# Patient Record
Sex: Female | Born: 1950
Health system: Southern US, Community
[De-identification: ages and names within clinical notes are randomized; demographics above are authoritative.]

## PROBLEM LIST (undated history)

## (undated) DIAGNOSIS — J189 Pneumonia, unspecified organism: Secondary | ICD-10-CM

## (undated) DIAGNOSIS — Z780 Asymptomatic menopausal state: Secondary | ICD-10-CM

## (undated) DIAGNOSIS — M1990 Unspecified osteoarthritis, unspecified site: Secondary | ICD-10-CM

## (undated) DIAGNOSIS — E538 Deficiency of other specified B group vitamins: Secondary | ICD-10-CM

## (undated) DIAGNOSIS — R7309 Other abnormal glucose: Secondary | ICD-10-CM

## (undated) DIAGNOSIS — D649 Anemia, unspecified: Secondary | ICD-10-CM

## (undated) DIAGNOSIS — E785 Hyperlipidemia, unspecified: Secondary | ICD-10-CM

## (undated) DIAGNOSIS — I10 Essential (primary) hypertension: Secondary | ICD-10-CM

## (undated) DIAGNOSIS — R059 Cough, unspecified: Secondary | ICD-10-CM

## (undated) DIAGNOSIS — G4733 Obstructive sleep apnea (adult) (pediatric): Secondary | ICD-10-CM

## (undated) DIAGNOSIS — M25569 Pain in unspecified knee: Secondary | ICD-10-CM

## (undated) DIAGNOSIS — I8290 Acute embolism and thrombosis of unspecified vein: Secondary | ICD-10-CM

## (undated) DIAGNOSIS — E119 Type 2 diabetes mellitus without complications: Secondary | ICD-10-CM

## (undated) HISTORY — DX: Cough, unspecified: R05.9

## (undated) HISTORY — PX: TONSILLECTOMY: SUR1361

## (undated) HISTORY — DX: Other abnormal glucose: R73.09

## (undated) HISTORY — DX: Obstructive sleep apnea (adult) (pediatric): G47.33

## (undated) HISTORY — PX: ABDOMINAL HYSTERECTOMY: SHX81

## (undated) HISTORY — DX: Asymptomatic menopausal state: Z78.0

## (undated) HISTORY — DX: Pain in unspecified knee: M25.569

## (undated) HISTORY — DX: Deficiency of other specified B group vitamins: E53.8

## (undated) HISTORY — DX: Unspecified osteoarthritis, unspecified site: M19.90

## (undated) HISTORY — DX: Acute embolism and thrombosis of unspecified vein: I82.90

## (undated) HISTORY — DX: Hyperlipidemia, unspecified: E78.5

## (undated) HISTORY — DX: Essential (primary) hypertension: I10

## (undated) HISTORY — DX: Anemia, unspecified: D64.9

## (undated) HISTORY — DX: Type 2 diabetes mellitus without complications: E11.9

## (undated) HISTORY — PX: WISDOM TOOTH EXTRACTION: SHX21

---

## 1996-06-19 DIAGNOSIS — Z780 Asymptomatic menopausal state: Secondary | ICD-10-CM

## 1996-06-19 HISTORY — DX: Asymptomatic menopausal state: Z78.0

## 1998-12-01 ENCOUNTER — Ambulatory Visit (HOSPITAL_COMMUNITY): Admission: RE | Admit: 1998-12-01 | Discharge: 1998-12-01 | Payer: Self-pay | Admitting: Gastroenterology

## 1999-01-17 ENCOUNTER — Encounter: Payer: Self-pay | Admitting: Gastroenterology

## 1999-01-17 ENCOUNTER — Ambulatory Visit (HOSPITAL_COMMUNITY): Admission: RE | Admit: 1999-01-17 | Discharge: 1999-01-17 | Payer: Self-pay | Admitting: Gastroenterology

## 1999-06-26 ENCOUNTER — Emergency Department (HOSPITAL_COMMUNITY): Admission: EM | Admit: 1999-06-26 | Discharge: 1999-06-26 | Payer: Self-pay | Admitting: Emergency Medicine

## 1999-07-18 ENCOUNTER — Emergency Department (HOSPITAL_COMMUNITY): Admission: EM | Admit: 1999-07-18 | Discharge: 1999-07-18 | Payer: Self-pay | Admitting: Emergency Medicine

## 1999-12-16 ENCOUNTER — Encounter: Payer: Self-pay | Admitting: Internal Medicine

## 1999-12-16 ENCOUNTER — Ambulatory Visit (HOSPITAL_COMMUNITY): Admission: RE | Admit: 1999-12-16 | Discharge: 1999-12-16 | Payer: Self-pay | Admitting: Internal Medicine

## 2000-04-06 ENCOUNTER — Ambulatory Visit (HOSPITAL_COMMUNITY): Admission: RE | Admit: 2000-04-06 | Discharge: 2000-04-06 | Payer: Self-pay | Admitting: Internal Medicine

## 2000-04-06 ENCOUNTER — Encounter: Payer: Self-pay | Admitting: Internal Medicine

## 2002-07-24 ENCOUNTER — Other Ambulatory Visit: Admission: RE | Admit: 2002-07-24 | Discharge: 2002-07-24 | Payer: Self-pay | Admitting: Obstetrics and Gynecology

## 2002-10-23 ENCOUNTER — Encounter: Payer: Self-pay | Admitting: Internal Medicine

## 2002-10-23 ENCOUNTER — Encounter: Admission: RE | Admit: 2002-10-23 | Discharge: 2002-10-23 | Payer: Self-pay | Admitting: Internal Medicine

## 2002-10-24 ENCOUNTER — Encounter: Payer: Self-pay | Admitting: Internal Medicine

## 2002-10-24 ENCOUNTER — Ambulatory Visit (HOSPITAL_COMMUNITY): Admission: RE | Admit: 2002-10-24 | Discharge: 2002-10-24 | Payer: Self-pay | Admitting: Internal Medicine

## 2003-11-10 ENCOUNTER — Other Ambulatory Visit: Admission: RE | Admit: 2003-11-10 | Discharge: 2003-11-10 | Payer: Self-pay | Admitting: Obstetrics and Gynecology

## 2004-07-14 ENCOUNTER — Ambulatory Visit: Payer: Self-pay | Admitting: Internal Medicine

## 2004-07-29 ENCOUNTER — Ambulatory Visit: Payer: Self-pay | Admitting: Internal Medicine

## 2004-08-15 ENCOUNTER — Encounter: Admission: RE | Admit: 2004-08-15 | Discharge: 2004-11-13 | Payer: Self-pay | Admitting: Internal Medicine

## 2004-10-27 ENCOUNTER — Ambulatory Visit: Payer: Self-pay | Admitting: Internal Medicine

## 2004-12-22 ENCOUNTER — Other Ambulatory Visit: Admission: RE | Admit: 2004-12-22 | Discharge: 2004-12-22 | Payer: Self-pay | Admitting: Obstetrics and Gynecology

## 2005-08-11 ENCOUNTER — Ambulatory Visit: Payer: Self-pay | Admitting: Internal Medicine

## 2005-10-13 ENCOUNTER — Ambulatory Visit: Payer: Self-pay | Admitting: Internal Medicine

## 2005-10-18 ENCOUNTER — Ambulatory Visit: Payer: Self-pay | Admitting: Internal Medicine

## 2006-02-14 ENCOUNTER — Ambulatory Visit: Payer: Self-pay | Admitting: Internal Medicine

## 2006-05-17 ENCOUNTER — Ambulatory Visit: Payer: Self-pay | Admitting: Internal Medicine

## 2006-05-17 LAB — CONVERTED CEMR LAB
Albumin: 3.7 g/dL (ref 3.5–5.2)
Alkaline Phosphatase: 73 units/L (ref 39–117)
CO2: 20 meq/L (ref 19–32)
Chol/HDL Ratio, serum: 3.4
Glucose, Bld: 74 mg/dL (ref 70–99)
Ketones, ur: NEGATIVE mg/dL
Leukocytes, UA: NEGATIVE
Mucus, UA: NEGATIVE
TSH: 1.67 microintl units/mL (ref 0.35–5.50)
Total Bilirubin: 0.6 mg/dL (ref 0.3–1.2)
Total Protein: 7 g/dL (ref 6.0–8.3)
VLDL: 12 mg/dL (ref 0–40)
pH: 6 (ref 5.0–8.0)

## 2006-07-17 ENCOUNTER — Ambulatory Visit: Payer: Self-pay | Admitting: Gastroenterology

## 2006-08-07 ENCOUNTER — Ambulatory Visit: Payer: Self-pay | Admitting: Gastroenterology

## 2006-08-17 ENCOUNTER — Ambulatory Visit: Payer: Self-pay | Admitting: Internal Medicine

## 2006-08-17 LAB — CONVERTED CEMR LAB
Basophils Relative: 4.2 % — ABNORMAL HIGH (ref 0.0–1.0)
CO2: 27 meq/L (ref 19–32)
Calcium: 9.3 mg/dL (ref 8.4–10.5)
Eosinophils Relative: 4.2 % (ref 0.0–5.0)
GFR calc Af Amer: 75 mL/min
Glucose, Bld: 90 mg/dL (ref 70–99)
Hemoglobin: 12.4 g/dL (ref 12.0–15.0)
Lymphocytes Relative: 27.7 % (ref 12.0–46.0)
Monocytes Absolute: 0.4 10*3/uL (ref 0.2–0.7)
Neutro Abs: 4.9 10*3/uL (ref 1.4–7.7)
Platelets: 224 10*3/uL (ref 150–400)
Saturation Ratios: 14.1 % — ABNORMAL LOW (ref 20.0–50.0)
Vitamin B-12: 210 pg/mL — ABNORMAL LOW (ref 211–911)
WBC: 8.5 10*3/uL (ref 4.5–10.5)

## 2006-08-24 ENCOUNTER — Ambulatory Visit: Payer: Self-pay | Admitting: Internal Medicine

## 2007-02-21 ENCOUNTER — Ambulatory Visit: Payer: Self-pay | Admitting: Internal Medicine

## 2007-02-25 LAB — CONVERTED CEMR LAB
Bilirubin, Direct: 0.1 mg/dL (ref 0.0–0.3)
CO2: 30 meq/L (ref 19–32)
Cholesterol: 216 mg/dL (ref 0–200)
Creatinine, Ser: 0.9 mg/dL (ref 0.4–1.2)
Glucose, Bld: 124 mg/dL — ABNORMAL HIGH (ref 70–99)
HDL: 61.8 mg/dL (ref >39.0)
Hgb A1c MFr Bld: 6.9 % — ABNORMAL HIGH (ref 4.6–6.0)
Potassium: 4.3 meq/L (ref 3.5–5.1)
Sodium: 140 meq/L (ref 135–145)
Total Bilirubin: 0.6 mg/dL (ref 0.3–1.2)
Total Protein: 7 g/dL (ref 6.0–8.3)
VLDL: 16 mg/dL (ref 0–40)
Vitamin B-12: 425 pg/mL (ref 211–911)

## 2007-04-24 ENCOUNTER — Encounter: Payer: Self-pay | Admitting: Internal Medicine

## 2007-04-24 DIAGNOSIS — E538 Deficiency of other specified B group vitamins: Secondary | ICD-10-CM

## 2007-04-24 DIAGNOSIS — D649 Anemia, unspecified: Secondary | ICD-10-CM | POA: Insufficient documentation

## 2007-04-24 DIAGNOSIS — E119 Type 2 diabetes mellitus without complications: Secondary | ICD-10-CM

## 2007-04-24 DIAGNOSIS — I10 Essential (primary) hypertension: Secondary | ICD-10-CM

## 2007-04-24 HISTORY — DX: Anemia, unspecified: D64.9

## 2007-04-24 HISTORY — DX: Deficiency of other specified B group vitamins: E53.8

## 2007-04-24 HISTORY — DX: Essential (primary) hypertension: I10

## 2007-04-24 HISTORY — DX: Type 2 diabetes mellitus without complications: E11.9

## 2007-07-17 ENCOUNTER — Ambulatory Visit: Payer: Self-pay | Admitting: Internal Medicine

## 2007-07-17 DIAGNOSIS — E785 Hyperlipidemia, unspecified: Secondary | ICD-10-CM

## 2007-07-17 HISTORY — DX: Hyperlipidemia, unspecified: E78.5

## 2007-10-18 ENCOUNTER — Ambulatory Visit: Payer: Self-pay | Admitting: Internal Medicine

## 2007-10-21 LAB — CONVERTED CEMR LAB
BUN: 18 mg/dL (ref 6–23)
Creatinine, Ser: 1.1 mg/dL (ref 0.4–1.2)
GFR calc Af Amer: 67 mL/min
GFR calc non Af Amer: 55 mL/min
Glucose, Bld: 97 mg/dL (ref 70–99)

## 2007-11-04 ENCOUNTER — Telehealth: Payer: Self-pay | Admitting: Internal Medicine

## 2008-06-02 ENCOUNTER — Ambulatory Visit: Payer: Self-pay | Admitting: Internal Medicine

## 2008-06-02 LAB — CONVERTED CEMR LAB
ALT: 23 units/L (ref 0–35)
AST: 18 units/L (ref 0–37)
Albumin: 3.5 g/dL (ref 3.5–5.2)
Alkaline Phosphatase: 64 units/L (ref 39–117)
BUN: 21 mg/dL (ref 6–23)
Bacteria, UA: NEGATIVE
Bilirubin, Direct: 0.1 mg/dL (ref 0.0–0.3)
CO2: 28 meq/L (ref 19–32)
Chloride: 105 meq/L (ref 96–112)
Crystals: NEGATIVE
Eosinophils Relative: 2.5 % (ref 0.0–5.0)
GFR calc Af Amer: 74 mL/min
Glucose, Bld: 141 mg/dL — ABNORMAL HIGH (ref 70–99)
HCT: 34.8 % — ABNORMAL LOW (ref 36.0–46.0)
Hemoglobin, Urine: NEGATIVE
Hemoglobin: 11.9 g/dL — ABNORMAL LOW (ref 12.0–15.0)
Leukocytes, UA: NEGATIVE
Lymphocytes Relative: 31.2 % (ref 12.0–46.0)
Monocytes Absolute: 0.7 10*3/uL (ref 0.1–1.0)
Monocytes Relative: 8.1 % (ref 3.0–12.0)
Nitrite: NEGATIVE
Platelets: 282 10*3/uL (ref 150–400)
Potassium: 4.6 meq/L (ref 3.5–5.1)
RBC / HPF: NONE SEEN
RDW: 13 % (ref 11.5–14.6)
Sodium: 139 meq/L (ref 135–145)
Total CHOL/HDL Ratio: 3.4
Total Protein, Urine: 30 mg/dL — AB
Total Protein: 6.9 g/dL (ref 6.0–8.3)
WBC, UA: NONE SEEN cells/hpf
WBC: 9 10*3/uL (ref 4.5–10.5)
pH: 6 (ref 5.0–8.0)

## 2008-06-08 ENCOUNTER — Ambulatory Visit: Payer: Self-pay | Admitting: Internal Medicine

## 2008-06-08 DIAGNOSIS — R209 Unspecified disturbances of skin sensation: Secondary | ICD-10-CM | POA: Insufficient documentation

## 2008-10-09 ENCOUNTER — Telehealth: Payer: Self-pay | Admitting: Internal Medicine

## 2008-10-27 ENCOUNTER — Ambulatory Visit: Payer: Self-pay | Admitting: Internal Medicine

## 2008-10-27 LAB — CONVERTED CEMR LAB
BUN: 15 mg/dL (ref 6–23)
CO2: 27 meq/L (ref 19–32)
Calcium: 9.3 mg/dL (ref 8.4–10.5)
Direct LDL: 132.2 mg/dL
GFR calc non Af Amer: 83.47 mL/min (ref >60)
Glucose, Bld: 121 mg/dL — ABNORMAL HIGH (ref 70–99)
HDL: 61.1 mg/dL (ref >39.00)
Hgb A1c MFr Bld: 7.3 % — ABNORMAL HIGH (ref 4.6–6.5)
Sodium: 142 meq/L (ref 135–145)
Vitamin B-12: 620 pg/mL (ref 211–911)

## 2008-11-03 ENCOUNTER — Ambulatory Visit: Payer: Self-pay | Admitting: Internal Medicine

## 2008-11-04 LAB — CONVERTED CEMR LAB: Vit D, 25-Hydroxy: 27 ng/mL — ABNORMAL LOW

## 2009-01-21 ENCOUNTER — Telehealth: Payer: Self-pay | Admitting: Internal Medicine

## 2009-06-19 DIAGNOSIS — R7309 Other abnormal glucose: Secondary | ICD-10-CM

## 2009-06-19 HISTORY — DX: Other abnormal glucose: R73.09

## 2009-09-15 ENCOUNTER — Ambulatory Visit: Payer: Self-pay | Admitting: Internal Medicine

## 2009-09-15 DIAGNOSIS — R0609 Other forms of dyspnea: Secondary | ICD-10-CM | POA: Insufficient documentation

## 2009-09-15 DIAGNOSIS — R5383 Other fatigue: Secondary | ICD-10-CM

## 2009-09-15 DIAGNOSIS — R0989 Other specified symptoms and signs involving the circulatory and respiratory systems: Secondary | ICD-10-CM | POA: Insufficient documentation

## 2009-09-15 DIAGNOSIS — R5381 Other malaise: Secondary | ICD-10-CM | POA: Insufficient documentation

## 2009-09-15 LAB — CONVERTED CEMR LAB: Vit D, 25-Hydroxy: 35 ng/mL (ref 30–89)

## 2009-09-17 LAB — CONVERTED CEMR LAB
AST: 21 units/L (ref 0–37)
Alkaline Phosphatase: 63 units/L (ref 39–117)
Basophils Absolute: 0 10*3/uL (ref 0.0–0.1)
Bilirubin, Direct: 0.1 mg/dL (ref 0.0–0.3)
CO2: 29 meq/L (ref 19–32)
Calcium: 10 mg/dL (ref 8.4–10.5)
Creatinine, Ser: 1.1 mg/dL (ref 0.4–1.2)
Eosinophils Absolute: 0.2 10*3/uL (ref 0.0–0.7)
GFR calc non Af Amer: 66 mL/min (ref >60)
Glucose, Bld: 132 mg/dL — ABNORMAL HIGH (ref 70–99)
Lymphocytes Relative: 39 % (ref 12.0–46.0)
MCHC: 33.1 g/dL (ref 30.0–36.0)
Monocytes Relative: 8.9 % (ref 3.0–12.0)
Neutrophils Relative %: 50.3 % (ref 43.0–77.0)
Nitrite: NEGATIVE
RBC: 4.29 M/uL (ref 3.87–5.11)
RDW: 14 % (ref 11.5–14.6)
Specific Gravity, Urine: 1.01 (ref 1.000–1.030)
Total Protein, Urine: NEGATIVE mg/dL
Vitamin B-12: 800 pg/mL (ref 211–911)
pH: 7 (ref 5.0–8.0)

## 2009-09-30 ENCOUNTER — Ambulatory Visit: Payer: Self-pay | Admitting: Pulmonary Disease

## 2009-09-30 DIAGNOSIS — G4733 Obstructive sleep apnea (adult) (pediatric): Secondary | ICD-10-CM

## 2009-09-30 HISTORY — DX: Obstructive sleep apnea (adult) (pediatric): G47.33

## 2009-10-25 ENCOUNTER — Encounter: Payer: Self-pay | Admitting: Pulmonary Disease

## 2009-10-25 ENCOUNTER — Ambulatory Visit (HOSPITAL_BASED_OUTPATIENT_CLINIC_OR_DEPARTMENT_OTHER): Admission: RE | Admit: 2009-10-25 | Discharge: 2009-10-25 | Payer: Self-pay | Admitting: Pulmonary Disease

## 2009-10-31 ENCOUNTER — Ambulatory Visit: Payer: Self-pay | Admitting: Pulmonary Disease

## 2009-11-01 ENCOUNTER — Telehealth (INDEPENDENT_AMBULATORY_CARE_PROVIDER_SITE_OTHER): Payer: Self-pay | Admitting: *Deleted

## 2009-11-12 ENCOUNTER — Ambulatory Visit: Payer: Self-pay | Admitting: Pulmonary Disease

## 2009-11-26 ENCOUNTER — Telehealth: Payer: Self-pay | Admitting: Internal Medicine

## 2009-12-06 ENCOUNTER — Telehealth (INDEPENDENT_AMBULATORY_CARE_PROVIDER_SITE_OTHER): Payer: Self-pay | Admitting: *Deleted

## 2009-12-07 ENCOUNTER — Ambulatory Visit: Payer: Self-pay | Admitting: Internal Medicine

## 2009-12-07 DIAGNOSIS — R358 Other polyuria: Secondary | ICD-10-CM

## 2009-12-07 DIAGNOSIS — R3589 Other polyuria: Secondary | ICD-10-CM | POA: Insufficient documentation

## 2009-12-07 DIAGNOSIS — M25519 Pain in unspecified shoulder: Secondary | ICD-10-CM | POA: Insufficient documentation

## 2010-01-29 ENCOUNTER — Encounter: Payer: Self-pay | Admitting: Pulmonary Disease

## 2010-02-11 ENCOUNTER — Encounter: Payer: Self-pay | Admitting: Family Medicine

## 2010-02-11 LAB — CONVERTED CEMR LAB
Cholesterol: 193 mg/dL (ref 0–200)
Glucose, Bld: 99 mg/dL (ref 70–99)
HDL: 62 mg/dL (ref >39)
Triglycerides: 93 mg/dL (ref <150)

## 2010-03-15 ENCOUNTER — Encounter: Payer: Self-pay | Admitting: Family Medicine

## 2010-03-15 LAB — CONVERTED CEMR LAB
Glucose, Bld: 124 mg/dL — ABNORMAL HIGH (ref 70–99)
HDL: 59 mg/dL (ref >39)
Total CHOL/HDL Ratio: 3.6
VLDL: 26 mg/dL (ref 0–40)

## 2010-03-30 ENCOUNTER — Ambulatory Visit: Payer: Self-pay | Admitting: Internal Medicine

## 2010-04-19 HISTORY — PX: HERNIA REPAIR: SHX51

## 2010-05-06 ENCOUNTER — Inpatient Hospital Stay (HOSPITAL_COMMUNITY): Admission: EM | Admit: 2010-05-06 | Discharge: 2010-05-07 | Payer: Self-pay | Admitting: Emergency Medicine

## 2010-05-11 ENCOUNTER — Ambulatory Visit: Payer: Self-pay | Admitting: Internal Medicine

## 2010-05-11 LAB — CONVERTED CEMR LAB
Albumin: 3.7 g/dL (ref 3.5–5.2)
CO2: 21 meq/L (ref 19–32)
Calcium: 9.1 mg/dL (ref 8.4–10.5)
Creatinine, Ser: 1.01 mg/dL (ref 0.40–1.20)
Hgb A1c MFr Bld: 6.6 % — ABNORMAL HIGH (ref 4.6–6.5)
Indirect Bilirubin: 0.4 mg/dL (ref 0.0–0.9)
Total Protein: 6.6 g/dL (ref 6.0–8.3)

## 2010-05-19 ENCOUNTER — Ambulatory Visit: Payer: Self-pay | Admitting: Internal Medicine

## 2010-05-25 ENCOUNTER — Encounter: Payer: Self-pay | Admitting: Internal Medicine

## 2010-07-19 NOTE — Assessment & Plan Note (Signed)
Summary: rov for review of sleep study   Primary Provider/Referring Provider:  Plotnikov  CC:  Pt is here for a f/u appt to discuss sleep study results.  .  History of Present Illness: the pt comes in today for f/u of her recent sleep study.  She was found to have an AHI of 14/hr with desat to 78%.  I have reviewed the study in detail with her, and answered all of her questions.  Medications Prior to Update: 1)  Lotensin 10 Mg  Tabs (Benazepril Hcl) .... 2 By Mouth Qhs 2)  Vitamin B-12 1000 Mcg  Tabs (Cyanocobalamin) .[name].. 1 Qd 3)  Fish Oil   Oil (Fish Oil) .[name].. 1 By Mouth Two Times A Day 4)  Vitamin D3 1000 Unit  Tabs (Cholecalciferol) .... 2 By Mouth Daily 5)  Loratadine 10 Mg  Tabs (Loratadine) .... Once Daily As Needed Allergies  Allergies (verified): 1)  ! Tetracycline  Review of Systems  The patient denies shortness of breath with activity, shortness of breath at rest, productive cough, non-productive cough, coughing up blood, chest pain, irregular heartbeats, acid heartburn, indigestion, loss of appetite, weight change, abdominal pain, difficulty swallowing, sore throat, tooth/dental problems, headaches, nasal congestion/difficulty breathing through nose, sneezing, itching, ear ache, anxiety, depression, hand/feet swelling, joint stiffness or pain, rash, change in color of mucus, and fever.    Vital Signs:  Patient profile:   60 year old female Height:      60 inches Weight:      244 pounds O2 Sat:      96 % on Room air Temp:     97.8 degrees F oral Pulse rate:   79 / minute BP sitting:   122 / 70  (left arm) Cuff size:   large  Vitals Entered By: Arman Filter LPN (Nov 12, 2009 9:39 AM)  O2 Flow:  Room air CC: Pt is here for a f/u appt to discuss sleep study results.   Comments Medications reviewed with patient Arman Filter LPN  Nov 12, 2009 9:44 AM    Physical Exam  General:  obese female in nad Nose:  mild narrowing Mouth:  elongation of soft palate and  uvula Extremities:  mild edema, no cyanosis Neurologic:  alert, mildly sleepy, moves all 4.   Impression & Recommendations:  Problem # 1:  OBSTRUCTIVE SLEEP APNEA (ICD-327.23) the pt has mild osa by her recent sleep study, but is symptomatic during the night and day.  I have explained this is not a significant health issue for her at this level, and the decision to treat should be based on its impact to her QOL.  I have discussed the various ways to treat, including a trial of weight loss alone, ua surgery, dental appliance, and cpap.  She feels that she needs to treat this while working on weight loss, and that cpap will probably work best for her.  Will go ahead and set up cpap at moderate pressure level with mask of choice and see her back in 4 weeks to troubleshoot.  At some point, will need to optimize pressure for her.  Other Orders: Est. Patient Level III (16109) DME Referral (DME)  Patient Instructions: 1)  will start on cpap for next 4 weeks..please call if having issues with the device. 2)  work on weight loss 3)  followup with me in 4mos.

## 2010-07-19 NOTE — Progress Notes (Signed)
Summary: REQ FOR RX?   Phone Note Call from Patient Call back at [phone]   Summary of Call: Pt c/o urinary symptoms and req rx for antibiotic to go to pharmacy.  Initial call taken by: Lamar Sprinkles, CMA,  November 26, 2009 5:11 PM  Follow-up for Phone Call        OV Sat clinic Use OTC meds for now UC tonight if sick  Follow-up by: Tresa Garter MD,  November 26, 2009 5:39 PM  Additional Follow-up for Phone Call Additional follow up Details #1::        LEft vm w/above Additional Follow-up by: Lamar Sprinkles, CMA,  November 26, 2009 5:42 PM

## 2010-07-19 NOTE — Assessment & Plan Note (Signed)
Summary: discuss referral/#/cd   Vital Signs:  Patient profile:   60 year old female Weight:      245 pounds BMI:     48.02 Temp:     98.5 degrees F oral Pulse rate:   88 / minute BP sitting:   108 / 72  (left arm)  Vitals Entered By: Tora Perches (September 15, 2009 9:03 AM) CC: discuss a referral   CC:  discuss a referral.  History of Present Illness: C/o possible OSA - snoring,she is feeling  tired a lot The patient presents for a follow up of hypertension, diabetes, hyperlipidemia   Preventive Screening-Counseling & Management  Alcohol-Tobacco     Smoking Status: never  Current Medications (verified): 1)  Lotensin 10 Mg  Tabs (Benazepril Hcl) .... 2 By Mouth Qhs 2)  Vitamin B-12 1000 Mcg  Tabs (Cyanocobalamin) .[name].. 1 Qd 3)  Fish Oil   Oil (Fish Oil) .[name].. 1 By Mouth Two Times A Day 4)  Vitamin D3 1000 Unit  Tabs (Cholecalciferol) .... 2 By Mouth Daily 5)  Loratadine 10 Mg  Tabs (Loratadine) .... Once Daily As Needed Allergies  Allergies: 1)  ! Tetracycline  Past History:  Past Medical History: Last updated: 07/17/2007 Anemia-NOS Diabetes mellitus, type II Hypertension Hyperlipidemia, mild Vit B12 def Insomnia Menopause 51  Dr Henderson Cloud  Social History: Last updated: 07/17/2007 Occupation: Married Never Smoked  Review of Systems       The patient complains of headaches.  The patient denies fever, chest pain, syncope, and hemoptysis.    Physical Exam  General:  overweight-appearing.   Nose:  External nasal examination shows no deformity or inflammation. Nasal mucosa are pink and moist without lesions or exudates. Mouth:  Oral mucosa and oropharynx without lesions or exudates.  Teeth in good repair. Lungs:  Normal respiratory effort, chest expands symmetrically. Lungs are clear to auscultation, no crackles or wheezes. Heart:  Normal rate and regular rhythm. S1 and S2 normal without gallop, murmur, click, rub or other extra sounds. Abdomen:  Bowel sounds  positive,abdomen soft and non-tender without masses, organomegaly or hernias noted. Msk:  No deformity or scoliosis noted of thoracic or lumbar spine.   Pulses:  R and L carotid,radial,femoral,dorsalis pedis and posterior tibial pulses are full and equal bilaterally Extremities:  No clubbing, cyanosis, edema, or deformity noted with normal full range of motion of all joints.   Neurologic:  L big toe tip is nymb Skin:  Intact without suspicious lesions or rashes Psych:  Cognition and judgment appear intact. Alert and cooperative with normal attention span and concentration. No apparent delusions, illusions, hallucinations   Impression & Recommendations:  Problem # 1:  FATIGUE (ICD-780.79) Assessment Deteriorated  Orders: TLB-B12, Serum-Total ONLY (16109-U04) TLB-BMP (Basic Metabolic Panel-BMET) (80048-METABOL) TLB-CBC Platelet - w/Differential (85025-CBCD) TLB-Hepatic/Liver Function Pnl (80076-HEPATIC) TLB-TSH (Thyroid Stimulating Hormone) (84443-TSH) TLB-A1C / Hgb A1C (Glycohemoglobin) (83036-A1C) T-Vitamin D (25-Hydroxy) (54098-11914) TLB-Udip ONLY (81003-UDIP) Sleep Disorder Referral (Sleep Disorder) to r/o OSA  Problem # 2:  SNORING (ICD-786.09) Assessment: Deteriorated  Her updated medication list for this problem includes:    Lotensin 10 Mg Tabs (Benazepril hcl) .[name]... 2 by mouth qhs  Orders: TLB-B12, Serum-Total ONLY (78295-A21) TLB-BMP (Basic Metabolic Panel-BMET) (80048-METABOL) TLB-CBC Platelet - w/Differential (85025-CBCD) TLB-Hepatic/Liver Function Pnl (80076-HEPATIC) TLB-TSH (Thyroid Stimulating Hormone) (84443-TSH) TLB-A1C / Hgb A1C (Glycohemoglobin) (83036-A1C) T-Vitamin D (25-Hydroxy) (30865-78469) TLB-Udip ONLY (81003-UDIP) Sleep Disorder Referral (Sleep Disorder)  Problem # 3:  VITAMIN B12 DEFICIENCY (ICD-266.2) Assessment: Comment Only  Orders: TLB-B12, Serum-Total ONLY (  16109-U04) TLB-BMP (Basic Metabolic Panel-BMET) (80048-METABOL) TLB-CBC Platelet -  w/Differential (85025-CBCD) TLB-Hepatic/Liver Function Pnl (80076-HEPATIC) TLB-TSH (Thyroid Stimulating Hormone) (84443-TSH) TLB-A1C / Hgb A1C (Glycohemoglobin) (83036-A1C) T-Vitamin D (25-Hydroxy) (54098-11914) TLB-Udip ONLY (81003-UDIP)  Problem # 4:  DIABETES MELLITUS, TYPE II (ICD-250.00) Assessment: Unchanged  Her updated medication list for this problem includes:    Lotensin 10 Mg Tabs (Benazepril hcl) .[name]... 2 by mouth qhs  Orders: TLB-B12, Serum-Total ONLY (78295-A21) TLB-BMP (Basic Metabolic Panel-BMET) (80048-METABOL) TLB-CBC Platelet - w/Differential (85025-CBCD) TLB-Hepatic/Liver Function Pnl (80076-HEPATIC) TLB-TSH (Thyroid Stimulating Hormone) (84443-TSH) TLB-A1C / Hgb A1C (Glycohemoglobin) (83036-A1C) T-Vitamin D (25-Hydroxy) (30865-78469) TLB-Udip ONLY (81003-UDIP)  Problem # 5:  Wt gain Assessment: Deteriorated See "Patient Instructions".   Complete Medication List: 1)  Lotensin 10 Mg Tabs (Benazepril hcl) .... 2 by mouth qhs 2)  Vitamin B-12 1000 Mcg Tabs (Cyanocobalamin) .[name].. 1 qd 3)  Fish Oil Oil (Fish oil) .[name].. 1 by mouth two times a day 4)  Vitamin D3 1000 Unit Tabs (Cholecalciferol) .... 2 by mouth daily 5)  Loratadine 10 Mg Tabs (Loratadine) .... Once daily as needed allergies  Patient Instructions: 1)  Use the Sinus rinse as needed 2)  Please schedule a follow-up appointment in 3 months. 3)  You need to lose weight. Consider a lower calorie diet and regular exercise.

## 2010-07-19 NOTE — Progress Notes (Signed)
Summary: setting change  Phone Note Call from Patient Call back at Work Phone [phone]   Caller: Mom Call For: clance Reason for Call: Talk to Nurse Summary of Call: cpap makes her feel like she isn't getting emough oxygen.  She says settings need to be changed. Initial call taken by: Eugene Gavia,  December 06, 2009 2:14 PM  Follow-up for Phone Call        Spoke with pt.  Pt states when she has cpap on she "can't get air"  States setting is currenty at 10-requesting this to be increased.  Will forward message to KC-pls advise.  Thanks! Gweneth Dimitri RN  December 06, 2009 2:32 PM   Additional Follow-up for Phone Call Additional follow up Details #1::        make sure she had turned ramp button off will have dme increase cpap pressure to 12 order sent to [facility] Additional Follow-up by: Barbaraann Share MD,  December 06, 2009 4:26 PM    Additional Follow-up for Phone Call Additional follow up Details #2::    LMOM THAT PT SHOULD HEAR FROM HOME [facility]ARE [facility] THIS WEEK AND ALSO LEFT ON MACHINE TO MAKE SURE SHE HAD THE RAMP BUTTON TURNED OFF Follow-up by: Philipp Deputy CMA,  December 06, 2009 4:31 PM

## 2010-07-19 NOTE — Progress Notes (Signed)
Summary: need to sched ov with kc  [id number]  ---- Converted from flag ---- ---- 11/01/2009 9:45 AM, Arman Filter LPN wrote: pt needs ov with kc to discuss sleep study results. ------------------------------  LM with family member for pt to call me back. Arman Filter LPN  Nov 01, 2009 9:47 AM  [facility].  Arman Filter LPN  Nov 04, 2009 3:31 PM  pt is scheduled for 11-12-2009 at 9:45am with [facility].  Arman Filter LPN  Nov 04, 2009 4:23 PM

## 2010-07-19 NOTE — Assessment & Plan Note (Signed)
Summary: 4 MTH FU STC   Vital Signs:  Patient profile:   60 year old female Height:      60 inches Weight:      216 pounds BMI:     42.34 Temp:     98.5 degrees F oral Pulse rate:   72 / minute Pulse rhythm:   regular Resp:     16 per minute BP sitting:   100 / 70  (left arm) Cuff size:   large  Vitals Entered By: Lanier Prude, CMA(AAMA) (May 19, 2010 7:52 AM) CC: 4 mo f/u  Is Patient Diabetic? Yes   Primary Care Provider:  Plotnikov  CC:  4 mo f/u .  History of Present Illness: The patient presents for a follow up of hypertension, diabetes, dyslipidemia, B12 def The patient presents for a post-hospital visit for incarcerated surgery   Current Medications (verified): 1)  Lotensin 10 Mg  Tabs (Benazepril Hcl) .... 2 By Mouth Qhs 2)  Vitamin B-12 1000 Mcg  Tabs (Cyanocobalamin) .[name].. 1 Qd 3)  Fish Oil   Oil (Fish Oil) .[name].. 1 By Mouth Two Times A Day 4)  Vitamin D3 1000 Unit  Tabs (Cholecalciferol) .... 2 By Mouth Daily 5)  Loratadine 10 Mg  Tabs (Loratadine) .... Once Daily As Needed Allergies 6)  Vimovo 500-20 Mg Tbec (Naproxen-Esomeprazole) .[name].. 1 By Mouth Once Daily - Two Times A Day Pc As Needed Pain  Allergies (verified): 1)  ! Tetracycline 2)  Oxycodone Hcl 3)  Hydrocodone  Past History:  Past Medical History: Last updated: 12/07/2009 Anemia-NOS Hypertension Hyperlipidemia, mild Vit B12 def Insomnia Menopause 89  Dr Assunta Found glu 2011 OSA on CPAP 2011  Past Surgical History: Hysterectomy Incarc. 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         Lost wt on HCG and diet - vegan now  Physical Exam  General:  Less overweight-appearing.   Ears:  External ear exam shows no significant lesions or deformities.  Otoscopic examination reveals clear canals, tympanic membranes are intact  bilaterally without bulging, retraction, inflammation or discharge. Hearing is grossly normal bilaterally. Nose:  External nasal examination shows no deformity or inflammation. Nasal mucosa are pink and moist without lesions or exudates. Mouth:  Oral mucosa and oropharynx without lesions or exudates.  Teeth in good repair. Neck:  No deformities, masses, or tenderness noted. Lungs:  Normal respiratory effort, chest expands symmetrically. Lungs are clear to auscultation, no crackles or wheezes. Heart:  Normal rate and regular rhythm. S1 and S2 normal without gallop, murmur, click, rub or other extra sounds. Abdomen:  Bowel sounds positive,abdomen soft and non-tender without masses, organomegaly or hernias noted. Scar above umbilicus is healing Msk:  No deformity or scoliosis noted of thoracic or lumbar spine.  R subacr space is tender Pulses:  R and L carotid,radial,femoral,dorsalis pedis and posterior tibial pulses are full and equal bilaterally Neurologic:  WNL Skin:  Intact without suspicious lesions or rashes Psych:  Cognition and judgment appear intact. Alert and cooperative with normal attention span and concentration. No apparent delusions, illusions, hallucinations   Impression & Recommendations:  Problem # 1:  VITAMIN B12 DEFICIENCY (ICD-266.2) Assessment Comment Only On the regimen of medicine(s) reflected in the chart    Problem # 2:  HYPERTENSION (ICD-401.9) Assessment: Improved  Her updated medication list for this  problem includes:    Lotensin 10 Mg Tabs (Benazepril hcl) .[name]... 2 by mouth at bedtime - ok to decrease if wt is going down and BP is nl  Problem # 3:  HYPERLIPIDEMIA (ICD-272.4) Assessment: Improved on diet  Problem # 4:  DIABETES MELLITUS, TYPE II (ICD-250.00) Assessment: Improved  Her updated medication list for this problem includes:    Lotensin 10 Mg Tabs (Benazepril hcl) .[name]... 2 by mouth qhs  Labs Reviewed: Creat: 1.01 (05/11/2010)    Reviewed HgBA1c  results: 6.6 (05/11/2010)  7.4 (09/15/2009)  Complete Medication List: 1)  Lotensin 10 Mg Tabs (Benazepril hcl) .... 2 by mouth qhs 2)  Fish Oil Oil (Fish oil) .[name].. 1 by mouth two times a day 3)  Vitamin D3 1000 Unit Tabs (Cholecalciferol) .... 2 by mouth daily 4)  Loratadine 10 Mg Tabs (Loratadine) .... Once daily as needed allergies 5)  Vitamin B-12 1000 Mcg Tabs (Cyanocobalamin) .[name].. 1 qd 6)  Vimovo 500-20 Mg Tbec (Naproxen-esomeprazole) .[name].. 1 by mouth once daily - two times a day pc as needed pain  Patient Instructions: 1)  Normal BP <130/85  2)  Please schedule a follow-up appointment in 4 months. 3)  BMP prior to visit, ICD-9: 4)  Hepatic Panel prior to visit, ICD-9:250.00  995.20 5)  Lipid Panel prior to visit, ICD-9: 6)  HbgA1C prior to visit, ICD-9: 7)  B12 266.20   Orders Added: 1)  Est. Patient Level IV [16109]

## 2010-07-19 NOTE — Assessment & Plan Note (Signed)
Summary: 3 MTH FU  STC   Vital Signs:  Patient profile:   60 year old female Weight:      241 pounds BMI:     47.24 Temp:     97.0 degrees F oral Pulse rate:   84 / minute Resp:     16 per minute BP sitting:   112 / 70  (left arm) Cuff size:   regular  Vitals Entered By: Lanier Prude, CMA (December 07, 2009 8:48 AM) CC: f/u   Primary Care Provider:  Laren Whaling  CC:  f/u.  History of Present Illness: F/u HTN, elev glu C/o R shoulder pain 6/10 - just took 2 courses of Prednisone by her ortho C/o frequent urination She is on a diet now  Current Medications (verified): 1)  Lotensin 10 Mg  Tabs (Benazepril Hcl) .... 2 By Mouth Qhs 2)  Vitamin B-12 1000 Mcg  Tabs (Cyanocobalamin) .[name].. 1 Qd 3)  Fish Oil   Oil (Fish Oil) .[name].. 1 By Mouth Two Times A Day 4)  Vitamin D3 1000 Unit  Tabs (Cholecalciferol) .... 2 By Mouth Daily 5)  Loratadine 10 Mg  Tabs (Loratadine) .... Once Daily As Needed Allergies  Allergies (verified): 1)  ! Tetracycline  Past History:  Social History: Last updated: 09/30/2009 Married with 2 children Never Smoked Work full time in [employer]  Past Medical History: Anemia-NOS Hypertension Hyperlipidemia, mild Vit B12 def Insomnia Menopause 89  Dr Assunta Found glu 2011 OSA on CPAP 2011  Review of Systems  The patient denies weight gain, dyspnea on exertion, abdominal pain, and hematochezia.         peeing more lately  Physical Exam  General:  overweight-appearing.   Mouth:  Oral mucosa and oropharynx without lesions or exudates.  Teeth in good repair. Lungs:  Normal respiratory effort, chest expands symmetrically. Lungs are clear to auscultation, no crackles or wheezes. Heart:  Normal rate and regular rhythm. S1 and S2 normal without gallop, murmur, click, rub or other extra sounds. Abdomen:  Bowel sounds positive,abdomen soft and non-tender without masses, organomegaly or hernias noted. Msk:  No deformity or scoliosis noted of thoracic or  lumbar spine.  R subacr space is tender Neurologic:  L big toe tip is nymb Skin:  Intact without suspicious lesions or rashes Psych:  Cognition and judgment appear intact. Alert and cooperative with normal attention span and concentration. No apparent delusions, illusions, hallucinations   Impression & Recommendations:  Problem # 1:  SHOULDER PAIN (ICD-719.41) R Assessment New PT is pending  Her updated medication list for this problem includes:    Vimovo 500-20 Mg Tbec (Naproxen-esomeprazole) .[name]... 1 by mouth once daily - two times a day pc as needed pain  Problem # 2:  FATIGUE (ICD-780.79) Assessment: Improved  Problem # 3:  OBSTRUCTIVE SLEEP APNEA (ICD-327.23) Assessment: Improved On CPAP  Problem # 4:  DIABETES MELLITUS, TYPE II (ICD-250.00) on diet Assessment: Comment Only Check labs in 3 months.  Her updated medication list for this problem includes:    Lotensin 10 Mg Tabs (Benazepril hcl) .[name]... 2 by mouth qhs We will discuss Metformin next time  Problem # 5:  POLYURIA (ICD-788.42) due to Predn Assessment: New Cont w/wt loss  Complete Medication List: 1)  Lotensin 10 Mg Tabs (Benazepril hcl) .... 2 by mouth qhs 2)  Vitamin B-12 1000 Mcg Tabs (Cyanocobalamin) .[name].. 1 qd 3)  Fish Oil Oil (Fish oil) .[name].. 1 by mouth two times a day 4)  Vitamin D3  1000 Unit Tabs (Cholecalciferol) .... 2 by mouth daily 5)  Loratadine 10 Mg Tabs (Loratadine) .... Once daily as needed allergies 6)  Vimovo 500-20 Mg Tbec (Naproxen-esomeprazole) .[name].. 1 by mouth once daily - two times a day pc as needed pain  Patient Instructions: 1)  Please schedule a follow-up appointment in 4 months. 2)  BMP prior to visit, ICD-9: 3)  Hepatic Panel prior to visit, ICD-9: 4)  TSH prior to visit, ICD-9: 5)  HbgA1C prior to visit, ICD-9:790.29  995.20 Prescriptions: VIMOVO 500-20 MG TBEC (NAPROXEN-ESOMEPRAZOLE) 1 by mouth once daily - two times a day pc as needed pain  #60 x 3   Entered and Authorized by:    Tresa Garter MD   Signed by:   Tresa Garter MD on 12/07/2009   Method used:   Print then Give to Patient   RxID:   [id number] LOTENSIN 10 MG  TABS (BENAZEPRIL HCL) 2 by mouth qhs  #180 x 3   Entered and Authorized by:   Tresa Garter MD   Signed by:   Tresa Garter MD on 12/07/2009   Method used:   Electronically to        [facility] # [id number]* (retail)       [address] [place], [place]  [address]       Ph: [phone]       Fax: [phone]   RxID:   [id number]

## 2010-07-19 NOTE — Assessment & Plan Note (Signed)
Summary: consult for osa   Primary Provider/Referring Provider:  Plotnikov  CC:  Sleep Consult , pt states her husband says she snores and stops breathing during the night, pt states she feels fatigued when she wakes up, and tired during the day.  History of Present Illness: The pt is a 60y/o female who I have been asked to see for possible osa.  She has been noted to have loud snoring, as well as pauses in her breathing during sleep.  She typically goes to bed at 8:30, and arises at 5:15 am to start her day.  She is not rested upon arising.  She thinks her alertness is ok during the day while at work, but stays very busy.  She will take a nap on occasion in the early evening upon getting home.  She dozes in the evening with tv, but denies any alertness issues with driving.  Her weight is up 20 pounds over the last 2 years, and her epworth score today is 12.  Allergies: 1)  ! Tetracycline  Past History:  Past Medical History: Anemia-NOS I Hypertension Hyperlipidemia, mild Vit B12 def Insomnia Menopause 30  Dr Henderson Cloud  Past Surgical History: Reviewed history from 04/24/2007 and no changes required. Hysterectomy  Family History: Reviewed history from 07/17/2007 and no changes required. Family History Hypertension  Social History: Reviewed history from 07/17/2007 and no changes required. Married with 2 children Never Smoked Work full time in [employer], [employer]  Review of Systems       The patient complains of joint stiffness or pain.  The patient denies shortness of breath with activity, shortness of breath at rest, productive cough, non-productive cough, coughing up blood, chest pain, irregular heartbeats, acid heartburn, indigestion, loss of appetite, weight change, abdominal pain, difficulty swallowing, sore throat, tooth/dental problems, headaches, nasal congestion/difficulty breathing through nose, sneezing, itching, ear ache, anxiety, depression, hand/feet swelling, rash,  change in color of mucus, and fever.    Vital Signs:  Patient profile:   60 year old female Height:      60 inches Weight:      250.4 pounds BMI:     49.08 O2 Sat:      94 % on Room air Temp:     98.3 degrees F oral Pulse rate:   68 / minute BP sitting:   108 / 68  (right arm)  Vitals Entered By: Sierra Genta RCP, Sierra Johnson (September 30, 2009 11:47 AM)  O2 Flow:  Room air CC: Sleep Consult , pt states her husband says she snores and stops breathing during the night, pt states she feels fatigued when she wakes up, tired during the day Research Study Name: Sleep Consult  Comments Medications reviewed with patient Sierra Genta RCP, Sierra Johnson  September 30, 2009 11:57 AM    Physical Exam  General:  obese female in nad Eyes:  PERRLA and EOMI.   Nose:  mild septal deviation to left with only mild narrowing. Mouth:  large tongue, moderate elongation of soft palate and uvula Neck:  no jvd, tmg, LN Lungs:  clear to auscultation Heart:  rrr, no mrg Abdomen:  soft and nontender, bs+ Extremities:  no edema noted, pulses intact distally no cyanosis Neurologic:  alert and oriented, moves all 4.   Impression & Recommendations:  Problem # 1:  OBSTRUCTIVE SLEEP APNEA (ICD-327.23) the pt's history is very suggestive of osa.  She has nonrestorative sleep, and the husband has witnessed loud snoring and pauses.  Even though she doesn't have a  lot of daytime sleepiness (she stays very active during day), she does have EDS in the early evening and will doze with tv.  I think there is enough suspicion to justify proceeding with a sleep study.  I have had a long discussion with the pt about sleep apnea, including its impact on QOL and CV health.  She is agreeable to the above evaluation.  Other Orders: Consultation Level IV (36644) Sleep Disorder Referral (Sleep Disorder)  Patient Instructions: 1)  will set up for sleep study.  I will call to arrange followup once results are available.

## 2010-07-19 NOTE — Letter (Signed)
Summary: [facility]  [facility]   Imported By: Lester  02/02/2010 10:31:13  _____________________________________________________________________  External Attachment:    Type:   Image     Comment:   External Document

## 2010-07-21 NOTE — Letter (Signed)
Summary: [facility]   Imported By: Sherian Rein 06/24/2010 11:45:54  _____________________________________________________________________  External Attachment:    Type:   Image     Comment:   External Document

## 2010-08-30 LAB — COMPREHENSIVE METABOLIC PANEL
ALT: 20 U/L (ref 0–35)
AST: 21 U/L (ref 0–37)
Albumin: 3.8 g/dL (ref 3.5–5.2)
Alkaline Phosphatase: 61 U/L (ref 39–117)
Potassium: 3.2 mEq/L — ABNORMAL LOW (ref 3.5–5.1)
Sodium: 135 mEq/L (ref 135–145)
Total Protein: 7.5 g/dL (ref 6.0–8.3)

## 2010-08-30 LAB — URINE MICROSCOPIC-ADD ON

## 2010-08-30 LAB — POCT CARDIAC MARKERS
CKMB, poc: <1 ng/mL — ABNORMAL LOW (ref 1.0–8.0)
Troponin i, poc: <0.05 ng/mL (ref 0.00–0.09)

## 2010-08-30 LAB — URINE CULTURE

## 2010-08-30 LAB — DIFFERENTIAL
Basophils Relative: 0 % (ref 0–1)
Eosinophils Absolute: 0.1 10*3/uL (ref 0.0–0.7)
Monocytes Absolute: 1 10*3/uL (ref 0.1–1.0)
Monocytes Relative: 9 % (ref 3–12)

## 2010-08-30 LAB — URINALYSIS, ROUTINE W REFLEX MICROSCOPIC
Hgb urine dipstick: NEGATIVE
Specific Gravity, Urine: 1.022 (ref 1.005–1.030)
Urobilinogen, UA: 0.2 mg/dL (ref 0.0–1.0)
pH: 5.5 (ref 5.0–8.0)

## 2010-08-30 LAB — CBC
HCT: 36.9 % (ref 36.0–46.0)
Platelets: 245 10*3/uL (ref 150–400)
RDW: 14.4 % (ref 11.5–15.5)
WBC: 11.4 10*3/uL — ABNORMAL HIGH (ref 4.0–10.5)

## 2010-09-29 ENCOUNTER — Other Ambulatory Visit (INDEPENDENT_AMBULATORY_CARE_PROVIDER_SITE_OTHER): Payer: BLUE CROSS/BLUE SHIELD

## 2010-09-29 ENCOUNTER — Other Ambulatory Visit (INDEPENDENT_AMBULATORY_CARE_PROVIDER_SITE_OTHER): Payer: BLUE CROSS/BLUE SHIELD | Admitting: Internal Medicine

## 2010-09-29 ENCOUNTER — Other Ambulatory Visit: Payer: Self-pay | Admitting: Internal Medicine

## 2010-09-29 DIAGNOSIS — E785 Hyperlipidemia, unspecified: Secondary | ICD-10-CM

## 2010-09-29 DIAGNOSIS — T887XXA Unspecified adverse effect of drug or medicament, initial encounter: Secondary | ICD-10-CM

## 2010-09-29 DIAGNOSIS — E119 Type 2 diabetes mellitus without complications: Secondary | ICD-10-CM

## 2010-09-29 LAB — HEPATIC FUNCTION PANEL
AST: 19 U/L (ref 0–37)
Alkaline Phosphatase: 68 U/L (ref 39–117)
Total Bilirubin: 0.5 mg/dL (ref 0.3–1.2)

## 2010-09-29 LAB — BASIC METABOLIC PANEL
Chloride: 103 mEq/L (ref 96–112)
GFR: 92.3 mL/min (ref >60.00)
Potassium: 4.5 mEq/L (ref 3.5–5.1)
Sodium: 138 mEq/L (ref 135–145)

## 2010-09-29 LAB — LDL CHOLESTEROL, DIRECT: Direct LDL: 123.7 mg/dL

## 2010-09-29 LAB — LIPID PANEL
Total CHOL/HDL Ratio: 3
VLDL: 15.2 mg/dL (ref 0.0–40.0)

## 2010-09-29 LAB — HEMOGLOBIN A1C: Hgb A1c MFr Bld: 6.7 % — ABNORMAL HIGH (ref 4.6–6.5)

## 2010-09-29 LAB — VITAMIN B12: Vitamin B-12: 368 pg/mL (ref 211–911)

## 2010-10-06 ENCOUNTER — Encounter: Payer: Self-pay | Admitting: Internal Medicine

## 2010-10-06 ENCOUNTER — Ambulatory Visit (INDEPENDENT_AMBULATORY_CARE_PROVIDER_SITE_OTHER): Payer: BLUE CROSS/BLUE SHIELD | Admitting: Internal Medicine

## 2010-10-06 DIAGNOSIS — E785 Hyperlipidemia, unspecified: Secondary | ICD-10-CM

## 2010-10-06 DIAGNOSIS — E119 Type 2 diabetes mellitus without complications: Secondary | ICD-10-CM

## 2010-10-06 DIAGNOSIS — E538 Deficiency of other specified B group vitamins: Secondary | ICD-10-CM

## 2010-10-06 DIAGNOSIS — I10 Essential (primary) hypertension: Secondary | ICD-10-CM

## 2010-10-06 MED ORDER — BENAZEPRIL HCL 10 MG PO TABS: 10.0000 mg | ORAL_TABLET | Freq: Every day | ORAL | Status: DC

## 2010-10-06 NOTE — Assessment & Plan Note (Signed)
On Rx  Labs OK

## 2010-10-06 NOTE — Progress Notes (Signed)
  Subjective:    Patient ID: Sierra Johnson, female    DOB: June 19, 1951, 60 y.o.   MRN: [id number]  HPI  The patient presents for a follow-up of  chronic hypertension, chronic dyslipidemia, type 2 diabetes controlled with diet The patient is here for a wellness exam. The patient has been doing well overall without major physical or psychological issues going on lately. The patient needs to address her chronic hypertension that has been well controlled with medicines; to address chronic  hyperlipidemia controlled with diet as well; and to address type 2 chronic diabetes, controlled with diet treatment and diet.    Review of Systems  Constitutional: Negative for chills.  Respiratory: Negative for cough.   Cardiovascular: Negative for chest pain and leg swelling.  Musculoskeletal: Negative for back pain.  Neurological: Negative for weakness.   Wt Readings from Last 3 Encounters:  10/06/10 222 lb (100.699 kg)  05/19/10 216 lb (97.977 kg)  12/07/09 241 lb (109.317 kg)   BP Readings from Last 3 Encounters:  10/06/10 130/70  05/19/10 100/70  12/07/09 112/70        Objective:   Physical Exam  Constitutional: She appears well-developed and well-nourished. No distress.       Obese  HENT:  Head: Normocephalic.  Right Ear: External ear normal.  Left Ear: External ear normal.  Nose: Nose normal.  Mouth/Throat: Oropharynx is clear and moist.  Eyes: Conjunctivae are normal. Pupils are equal, round, and reactive to light. Right eye exhibits no discharge. Left eye exhibits no discharge.  Neck: Normal range of motion. Neck supple. No JVD present. No tracheal deviation present. No thyromegaly present.  Cardiovascular: Normal rate, regular rhythm and normal heart sounds.   Pulmonary/Chest: No stridor. No respiratory distress. She has no wheezes.  Abdominal: Soft. Bowel sounds are normal. She exhibits no distension and no mass. There is no tenderness. There is no rebound and no guarding.    Musculoskeletal: She exhibits no edema and no tenderness.  Lymphadenopathy:    She has no cervical adenopathy.  Neurological: She displays normal reflexes. No cranial nerve deficit. She exhibits normal muscle tone. Coordination normal.  Skin: No rash noted. No erythema.  Psychiatric: She has a normal mood and affect. Her behavior is normal. Judgment and thought content normal.          Assessment & Plan:  DIABETES MELLITUS, TYPE II Better. Cont w/diet and wt loss  HYPERTENSION Cont Rx  VITAMIN B12 DEFICIENCY On Rx  Labs OK  HYPERLIPIDEMIA On diet. Cont w/wt loss    Wellness  Health related issues discussed. Declined shots.

## 2010-10-06 NOTE — Assessment & Plan Note (Signed)
Better. Cont w/diet and wt loss

## 2010-10-06 NOTE — Assessment & Plan Note (Signed)
On diet. Cont w/wt loss

## 2010-10-06 NOTE — Assessment & Plan Note (Signed)
Cont Rx 

## 2010-11-04 NOTE — Assessment & Plan Note (Signed)
[facility]                         GASTROENTEROLOGY OFFICE NOTE   ELONDA, GIULIANO                      MRN:          [id number]  DATE:07/17/2006                            DOB:          Feb 01, 1951    REFERRING PHYSICIAN:  Guy Sandifer. Henderson Cloud, M.D.   Sierra Johnson is a 60 year old black female referred through the courtesy of  Dr. Henderson Cloud for screening colonoscopy.   Mailin has been in excellent health most of her life and only suffers  from hypertension. She otherwise has been well and denies any general  medical or gastrointestinal problems. She did have a flexible  sigmoidoscopy by Dr. Arlyce Dice in June 2000 which was normal. She has  regular bowel movements at this time without melena or hematochezia,  abdominal pain, any upper GI or hepatobiliary complaints. She follows a  regular diet and denies any specific food intolerances even to lactose.  She gives no history of hepatitis, pancreatitis or hepatobiliary  problems.   PAST MEDICAL HISTORY:  Except for essential hypertension is otherwise  unremarkable. She has had a hysterectomy and one ovary removed.   FAMILY HISTORY:  Noncontributory.   SOCIAL HISTORY:  She is married and lives with her husband. 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 She does no smoke or abuse ethanol.   MEDICATIONS:  HCTZ 12.5 mg a day. She denies drug allergies.   REVIEW OF SYSTEMS:  Noncontributory. The patient previously was diabetic  on oral medications but has lost weight and is not on any diabetic  treatment at this time.   PHYSICAL EXAMINATION:  GENERAL:  She is a healthy-appearing black female  appearing her stated age in no distress. She is 4 feet 11 inches tall  and weighs 228 pounds.  VITAL SIGNS:  Blood pressure is 142/92, pulse was 80 and regular. I  could not appreciate stigmata of chronic liver disease.  CHEST:  Entirely clear and she appeared to be in a regular rhythm  without murmur, gallop or  rub.  ABDOMEN:  Showed no organomegaly, masses or tenderness. Bowel sounds  were normal.  EXTREMITIES:  Peripheral extremities were unremarkable.  NEUROLOGIC:  Mental status was clear.  RECTAL:  Deferred.   ASSESSMENT:  This patient needs a screening colonoscopy because of her  age. She is having no discreet gastrointestinal problems.   RECOMMENDATIONS:  Colonoscopy at her convenience with monitorization as  usual. She is to continue her HCTZ per Dr. Posey Rea.     Vania Rea. Jarold Motto, MD, Caleen Essex, FAGA  Electronically Signed    DRP/MedQ  DD: 07/17/2006  DT: 07/17/2006  Job #: [id number]   cc:   Guy Sandifer. Henderson Cloud, M.D.  Georgina Quint. Plotnikov, MD

## 2011-01-23 ENCOUNTER — Other Ambulatory Visit: Payer: Self-pay | Admitting: Internal Medicine

## 2011-02-09 ENCOUNTER — Ambulatory Visit: Payer: BLUE CROSS/BLUE SHIELD | Admitting: Internal Medicine

## 2011-02-09 DIAGNOSIS — Z029 Encounter for administrative examinations, unspecified: Secondary | ICD-10-CM

## 2011-03-30 ENCOUNTER — Telehealth: Payer: Self-pay | Admitting: *Deleted

## 2011-03-30 NOTE — Telephone Encounter (Signed)
Spoke w/pt - she c/o sinus congestion and req RX from MD. Informed patient that she must have OV for eval and scheduled her for apt tomorrow.

## 2011-03-31 ENCOUNTER — Encounter: Payer: Self-pay | Admitting: Endocrinology

## 2011-03-31 ENCOUNTER — Ambulatory Visit (INDEPENDENT_AMBULATORY_CARE_PROVIDER_SITE_OTHER): Payer: BLUE CROSS/BLUE SHIELD | Admitting: Endocrinology

## 2011-03-31 VITALS — BP 122/68 | HR 71 | Temp 97.2°F | Ht 60.0 in | Wt 222.0 lb

## 2011-03-31 DIAGNOSIS — J069 Acute upper respiratory infection, unspecified: Secondary | ICD-10-CM

## 2011-03-31 MED ORDER — CEFUROXIME AXETIL 250 MG PO TABS: 250.0000 mg | ORAL_TABLET | Freq: Two times a day (BID) | ORAL | Status: AC

## 2011-03-31 NOTE — Patient Instructions (Addendum)
i have sent a prescription to your pharmacy Loratadine-d (non-prescription) will help your congestion. I hope you feel better soon.  If you don't feel better in a week, please call your doctor.

## 2011-03-31 NOTE — Progress Notes (Signed)
  Subjective:    Patient ID: Sierra Johnson, female    DOB: 09/25/1950, 60 y.o.   MRN: [id number]  HPI Pt states 3 days of slight pain at the throat, and assoc nasal congestion.  She also has rhinorrhea.   Review of Systems Denies fever, earache, cough and wheezing.    Objective:   Physical Exam VITAL SIGNS:  See vs page GENERAL: no distress.  Obese head: no deformity eyes: no periorbital swelling, no proptosis external nose and ears are normal mouth: no lesion seen Left tm is red.  Right is normal NECK: There is no palpable thyroid enlargement.  No thyroid nodule is palpable.  No palpable lymphadenopathy at the anterior neck. LUNGS:  Clear to auscultation       Assessment & Plan:  Glenford Peers, new

## 2011-04-03 ENCOUNTER — Telehealth: Payer: Self-pay | Admitting: *Deleted

## 2011-04-03 MED ORDER — PROMETHAZINE-CODEINE 6.25-10 MG/5ML PO SYRP: 5.0000 mL | ORAL_SOLUTION | ORAL | Status: AC | PRN

## 2011-04-03 NOTE — Telephone Encounter (Signed)
No fever or SOB, I called in RX. Patient informed

## 2011-04-03 NOTE — Telephone Encounter (Signed)
Please verify no fever or sob i printed 

## 2011-04-03 NOTE — Telephone Encounter (Signed)
Left message for pt to callback office.  

## 2011-04-03 NOTE — Telephone Encounter (Signed)
Patient requesting RX from MD for cough. She was seen in the office Friday and given RX for abx.

## 2011-04-12 ENCOUNTER — Other Ambulatory Visit (INDEPENDENT_AMBULATORY_CARE_PROVIDER_SITE_OTHER): Payer: BLUE CROSS/BLUE SHIELD

## 2011-04-12 ENCOUNTER — Other Ambulatory Visit: Payer: Self-pay | Admitting: Internal Medicine

## 2011-04-12 DIAGNOSIS — I10 Essential (primary) hypertension: Secondary | ICD-10-CM

## 2011-04-12 DIAGNOSIS — E119 Type 2 diabetes mellitus without complications: Secondary | ICD-10-CM

## 2011-04-12 LAB — COMPREHENSIVE METABOLIC PANEL WITH GFR
ALT: 27 U/L (ref 0–35)
AST: 22 U/L (ref 0–37)
Albumin: 3.7 g/dL (ref 3.5–5.2)
Alkaline Phosphatase: 73 U/L (ref 39–117)
BUN: 14 mg/dL (ref 6–23)
CO2: 29 meq/L (ref 19–32)
Calcium: 9.4 mg/dL (ref 8.4–10.5)
Chloride: 101 meq/L (ref 96–112)
Creatinine, Ser: 1 mg/dL (ref 0.4–1.2)
GFR: 73.27 mL/min
Glucose, Bld: 130 mg/dL — ABNORMAL HIGH (ref 70–99)
Potassium: 4.5 meq/L (ref 3.5–5.1)
Sodium: 139 meq/L (ref 135–145)
Total Bilirubin: 0.5 mg/dL (ref 0.3–1.2)
Total Protein: 7.6 g/dL (ref 6.0–8.3)

## 2011-04-12 LAB — LDL CHOLESTEROL, DIRECT: Direct LDL: 142.3 mg/dL

## 2011-04-12 LAB — HEMOGLOBIN A1C: Hgb A1c MFr Bld: 7.7 % — ABNORMAL HIGH (ref 4.6–6.5)

## 2011-04-12 LAB — LIPID PANEL: Total CHOL/HDL Ratio: 3

## 2011-04-17 ENCOUNTER — Ambulatory Visit (INDEPENDENT_AMBULATORY_CARE_PROVIDER_SITE_OTHER): Payer: BLUE CROSS/BLUE SHIELD | Admitting: Internal Medicine

## 2011-04-17 ENCOUNTER — Encounter: Payer: Self-pay | Admitting: Internal Medicine

## 2011-04-17 VITALS — BP 118/82 | HR 80 | Temp 97.9°F | Resp 16 | Wt 243.0 lb

## 2011-04-17 DIAGNOSIS — IMO0001 Reserved for inherently not codable concepts without codable children: Secondary | ICD-10-CM

## 2011-04-17 DIAGNOSIS — Z0000 Encounter for general adult medical examination without abnormal findings: Secondary | ICD-10-CM | POA: Insufficient documentation

## 2011-04-17 DIAGNOSIS — J019 Acute sinusitis, unspecified: Secondary | ICD-10-CM

## 2011-04-17 DIAGNOSIS — E1165 Type 2 diabetes mellitus with hyperglycemia: Secondary | ICD-10-CM

## 2011-04-17 DIAGNOSIS — E538 Deficiency of other specified B group vitamins: Secondary | ICD-10-CM

## 2011-04-17 DIAGNOSIS — IMO0002 Reserved for concepts with insufficient information to code with codable children: Secondary | ICD-10-CM

## 2011-04-17 MED ORDER — METFORMIN HCL 500 MG PO TABS: 500.0000 mg | ORAL_TABLET | Freq: Two times a day (BID) | ORAL | Status: DC

## 2011-04-17 MED ORDER — AZITHROMYCIN 250 MG PO TABS: ORAL_TABLET | ORAL | Status: AC

## 2011-04-17 NOTE — Assessment & Plan Note (Signed)
Zpac 

## 2011-04-17 NOTE — Assessment & Plan Note (Signed)
We discussed age appropriate health related issues, including available/recomended screening tests and vaccinations. We discussed a need for adhering to healthy diet and exercise. Labs/EKG were reviewed/ordered. All questions were answered.  Loose wt GYN q 12 mo

## 2011-04-17 NOTE — Assessment & Plan Note (Signed)
Continue with current prescription therapy as reflected on the Med list.  

## 2011-04-17 NOTE — Assessment & Plan Note (Signed)
Start Metformin

## 2011-04-17 NOTE — Progress Notes (Signed)
  Subjective:    Patient ID: Sierra Johnson, female    DOB: Oct 25, 1950, 60 y.o.   MRN: [id number]  HPI  The patient is here for a wellness exam. The patient has been doing well overall without major physical or psychological issues going on lately. The patient needs to address  chronic hypertension that has been well controlled with medicines C/o sinusitis x 4 wks  Wt Readings from Last 3 Encounters:  04/17/11 243 lb (110.224 kg)  03/31/11 222 lb (100.699 kg)  10/06/10 222 lb (100.699 kg)     Review of Systems  Constitutional: Positive for unexpected weight change. Negative for fever, chills, diaphoresis, activity change, appetite change and fatigue.  HENT: Positive for congestion and sinus pressure. Negative for hearing loss, ear pain, nosebleeds, sore throat, facial swelling, rhinorrhea, sneezing, mouth sores, trouble swallowing, neck pain, neck stiffness, postnasal drip and tinnitus.   Eyes: Negative for pain, discharge, redness, itching and visual disturbance.  Respiratory: Negative for cough, chest tightness, shortness of breath, wheezing and stridor.   Cardiovascular: Negative for chest pain, palpitations and leg swelling.  Gastrointestinal: Negative for nausea, diarrhea, constipation, blood in stool, abdominal distention, anal bleeding and rectal pain.  Genitourinary: Negative for dysuria, urgency, frequency, hematuria, flank pain, vaginal bleeding, vaginal discharge, difficulty urinating, genital sores and pelvic pain.  Musculoskeletal: Negative for back pain, joint swelling, arthralgias and gait problem.  Skin: Negative.  Negative for rash.  Neurological: Negative for dizziness, tremors, seizures, syncope, speech difficulty, weakness, numbness and headaches.  Hematological: Negative for adenopathy. Does not bruise/bleed easily.  Psychiatric/Behavioral: Negative for suicidal ideas, behavioral problems, sleep disturbance, dysphoric mood and decreased concentration. The patient is  not nervous/anxious.        Objective:   Physical Exam  Constitutional: She appears well-developed. No distress.       obese  HENT:  Head: Normocephalic.  Right Ear: External ear normal.  Left Ear: External ear normal.  Nose: Nose normal.  Mouth/Throat: Oropharynx is clear and moist.       eryth nasal mucosa  Eyes: Conjunctivae are normal. Pupils are equal, round, and reactive to light. Right eye exhibits no discharge. Left eye exhibits no discharge.  Neck: Normal range of motion. Neck supple. No JVD present. No tracheal deviation present. No thyromegaly present.  Cardiovascular: Normal rate, regular rhythm and normal heart sounds.   Pulmonary/Chest: No stridor. No respiratory distress. She has no wheezes.  Abdominal: Soft. Bowel sounds are normal. She exhibits no distension and no mass. There is no tenderness. There is no rebound and no guarding.  Musculoskeletal: She exhibits no edema and no tenderness.  Lymphadenopathy:    She has no cervical adenopathy.  Neurological: She displays normal reflexes. No cranial nerve deficit. She exhibits normal muscle tone. Coordination normal.  Skin: No rash noted. No erythema.  Psychiatric: She has a normal mood and affect. Her behavior is normal. Judgment and thought content normal.          Assessment & Plan:

## 2011-04-24 ENCOUNTER — Telehealth: Payer: Self-pay | Admitting: *Deleted

## 2011-04-24 MED ORDER — AZITHROMYCIN 250 MG PO TABS: ORAL_TABLET | ORAL | Status: AC

## 2011-04-24 NOTE — Telephone Encounter (Signed)
Ok zpac - done. OV if not better Thx

## 2011-04-24 NOTE — Telephone Encounter (Signed)
Use a saline rinse. OV w/any MD this wk Thx

## 2011-04-24 NOTE — Telephone Encounter (Signed)
Pt states she woke up this morning with worsening allergies/sinus congestion.  This has been ongoing for 5 weeks.  Worse today.  She has tried Claritin-D, Mucinex without relief.  No headache, ne fever.  Pt would like something called in.  Please advise.

## 2011-04-24 NOTE — Telephone Encounter (Signed)
Pt states she was here last week and told to use the claritin-d.  Pt would prefer not to make another appt to be told to take OTC treatments that will not work.  Please advise.

## 2011-04-25 NOTE — Telephone Encounter (Signed)
Patient Informed

## 2011-08-15 ENCOUNTER — Ambulatory Visit: Payer: BLUE CROSS/BLUE SHIELD | Admitting: Internal Medicine

## 2011-11-17 ENCOUNTER — Other Ambulatory Visit (INDEPENDENT_AMBULATORY_CARE_PROVIDER_SITE_OTHER): Payer: BC Managed Care – PPO

## 2011-11-17 DIAGNOSIS — E538 Deficiency of other specified B group vitamins: Secondary | ICD-10-CM

## 2011-11-17 DIAGNOSIS — IMO0001 Reserved for inherently not codable concepts without codable children: Secondary | ICD-10-CM

## 2011-11-17 DIAGNOSIS — E1165 Type 2 diabetes mellitus with hyperglycemia: Secondary | ICD-10-CM

## 2011-11-17 DIAGNOSIS — IMO0002 Reserved for concepts with insufficient information to code with codable children: Secondary | ICD-10-CM

## 2011-11-17 LAB — HEMOGLOBIN A1C: Hgb A1c MFr Bld: 7.2 % — ABNORMAL HIGH (ref 4.6–6.5)

## 2011-11-17 LAB — BASIC METABOLIC PANEL
Chloride: 105 mEq/L (ref 96–112)
Potassium: 5.2 mEq/L — ABNORMAL HIGH (ref 3.5–5.1)
Sodium: 139 mEq/L (ref 135–145)

## 2011-11-17 LAB — VITAMIN B12: Vitamin B-12: 457 pg/mL (ref 211–911)

## 2011-11-27 ENCOUNTER — Encounter: Payer: Self-pay | Admitting: Internal Medicine

## 2011-11-27 ENCOUNTER — Ambulatory Visit (INDEPENDENT_AMBULATORY_CARE_PROVIDER_SITE_OTHER): Payer: BC Managed Care – PPO | Admitting: Internal Medicine

## 2011-11-27 VITALS — BP 110/68 | HR 84 | Temp 98.4°F | Resp 16 | Wt 247.0 lb

## 2011-11-27 DIAGNOSIS — I10 Essential (primary) hypertension: Secondary | ICD-10-CM

## 2011-11-27 DIAGNOSIS — IMO0001 Reserved for inherently not codable concepts without codable children: Secondary | ICD-10-CM

## 2011-11-27 DIAGNOSIS — E785 Hyperlipidemia, unspecified: Secondary | ICD-10-CM

## 2011-11-27 DIAGNOSIS — E669 Obesity, unspecified: Secondary | ICD-10-CM

## 2011-11-27 DIAGNOSIS — Z0000 Encounter for general adult medical examination without abnormal findings: Secondary | ICD-10-CM

## 2011-11-27 DIAGNOSIS — E1165 Type 2 diabetes mellitus with hyperglycemia: Secondary | ICD-10-CM

## 2011-11-27 DIAGNOSIS — IMO0002 Reserved for concepts with insufficient information to code with codable children: Secondary | ICD-10-CM

## 2011-11-27 DIAGNOSIS — E538 Deficiency of other specified B group vitamins: Secondary | ICD-10-CM

## 2011-11-27 MED ORDER — BENAZEPRIL HCL 10 MG PO TABS: 20.0000 mg | ORAL_TABLET | Freq: Every day | ORAL | Status: DC

## 2011-11-27 MED ORDER — METFORMIN HCL 500 MG PO TABS: 500.0000 mg | ORAL_TABLET | Freq: Two times a day (BID) | ORAL | Status: DC

## 2011-11-27 MED ORDER — FLUTICASONE PROPIONATE 50 MCG/ACT NA SUSP: 2.0000 | Freq: Every day | NASAL | Status: DC

## 2011-11-27 MED ORDER — AMOXICILLIN 500 MG PO CAPS: 1000.0000 mg | ORAL_CAPSULE | Freq: Two times a day (BID) | ORAL | Status: DC

## 2011-11-27 MED ORDER — NAPROXEN-ESOMEPRAZOLE 500-20 MG PO TBEC: 1.0000 | DELAYED_RELEASE_TABLET | Freq: Every day | ORAL | Status: DC

## 2011-11-27 NOTE — Assessment & Plan Note (Signed)
discussed

## 2011-11-27 NOTE — Progress Notes (Signed)
Patient ID: Sierra Johnson, female   DOB: 08-17-1950, 61 y.o.   MRN: [id number]  Subjective:    Patient ID: Sierra Johnson, female    DOB: 1950/07/21, 62 y.o.   MRN: [id number]  Knee Pain  Pertinent negatives include no numbness.  Cough Pertinent negatives include no chest pain, chills, ear pain, eye redness, fever, headaches, postnasal drip, rash, rhinorrhea, sore throat, shortness of breath or wheezing.    F/u vit B12 def, OA, hypertension that has been well controlled with medicines C/o sinus d/c, cough x 4 wks C/o R knee pain  Wt Readings from Last 3 Encounters:  11/27/11 247 lb (112.038 kg)  04/17/11 243 lb (110.224 kg)  03/31/11 222 lb (100.699 kg)   BP Readings from Last 3 Encounters:  11/27/11 110/68  04/17/11 118/82  03/31/11 122/68     Review of Systems  Constitutional: Positive for unexpected weight change. Negative for fever, chills, diaphoresis, activity change, appetite change and fatigue.  HENT: Positive for congestion and sinus pressure. Negative for hearing loss, ear pain, nosebleeds, sore throat, facial swelling, rhinorrhea, sneezing, mouth sores, trouble swallowing, neck pain, neck stiffness, postnasal drip and tinnitus.   Eyes: Negative for pain, discharge, redness, itching and visual disturbance.  Respiratory: Positive for cough. Negative for chest tightness, shortness of breath, wheezing and stridor.   Cardiovascular: Negative for chest pain, palpitations and leg swelling.  Gastrointestinal: Negative for nausea, diarrhea, constipation, blood in stool, abdominal distention, anal bleeding and rectal pain.  Genitourinary: Negative for dysuria, urgency, frequency, hematuria, flank pain, vaginal bleeding, vaginal discharge, difficulty urinating, genital sores and pelvic pain.  Musculoskeletal: Negative for back pain, joint swelling, arthralgias and gait problem.  Skin: Negative.  Negative for rash.  Neurological: Negative for dizziness, tremors, seizures, syncope,  speech difficulty, weakness, numbness and headaches.  Hematological: Negative for adenopathy. Does not bruise/bleed easily.  Psychiatric/Behavioral: Negative for suicidal ideas, behavioral problems, sleep disturbance, dysphoric mood and decreased concentration. The patient is not nervous/anxious.        Objective:   Physical Exam  Constitutional: She appears well-developed. No distress.       obese  HENT:  Head: Normocephalic.  Right Ear: External ear normal.  Left Ear: External ear normal.  Nose: Nose normal.  Mouth/Throat: Oropharynx is clear and moist.       eryth nasal mucosa  Eyes: Conjunctivae are normal. Pupils are equal, round, and reactive to light. Right eye exhibits no discharge. Left eye exhibits no discharge.  Neck: Normal range of motion. Neck supple. No JVD present. No tracheal deviation present. No thyromegaly present.  Cardiovascular: Normal rate, regular rhythm and normal heart sounds.   Pulmonary/Chest: No stridor. No respiratory distress. She has no wheezes.  Abdominal: Soft. Bowel sounds are normal. She exhibits no distension and no mass. There is no tenderness. There is no rebound and no guarding.  Musculoskeletal: She exhibits no edema and no tenderness.  Lymphadenopathy:    She has no cervical adenopathy.  Neurological: She displays normal reflexes. No cranial nerve deficit. She exhibits normal muscle tone. Coordination normal.  Skin: No rash noted. No erythema.  Psychiatric: She has a normal mood and affect. Her behavior is normal. Judgment and thought content normal.    Lab Results  Component Value Date   WBC 11.4* 05/06/2010   HGB 12.3 05/06/2010   HCT 36.9 05/06/2010   PLT 245 05/06/2010   GLUCOSE 89 11/17/2011   CHOL 213* 04/12/2011   TRIG 86.0 04/12/2011   HDL 62.80 04/12/2011  LDLDIRECT 142.3 04/12/2011   LDLCALC 125* 03/15/2010   ALT 27 04/12/2011   AST 22 04/12/2011   NA 139 11/17/2011   K 5.2* 11/17/2011   CL 105 11/17/2011   CREATININE 0.9  11/17/2011   BUN 12 11/17/2011   CO2 27 11/17/2011   TSH 3.684 05/11/2010   HGBA1C 7.2* 11/17/2011        Assessment & Plan:

## 2011-11-27 NOTE — Assessment & Plan Note (Signed)
Continue with current prescription therapy as reflected on the Med list.  

## 2011-11-27 NOTE — Assessment & Plan Note (Signed)
Lap Band was discussed

## 2011-11-27 NOTE — Patient Instructions (Signed)
[email or web address] 

## 2011-11-27 NOTE — Assessment & Plan Note (Signed)
Better controlled Loosing wt was discussed

## 2012-01-18 ENCOUNTER — Encounter: Payer: Self-pay | Admitting: Internal Medicine

## 2012-01-18 ENCOUNTER — Ambulatory Visit (INDEPENDENT_AMBULATORY_CARE_PROVIDER_SITE_OTHER): Payer: BC Managed Care – PPO | Admitting: Internal Medicine

## 2012-01-18 DIAGNOSIS — E119 Type 2 diabetes mellitus without complications: Secondary | ICD-10-CM

## 2012-01-18 DIAGNOSIS — IMO0001 Reserved for inherently not codable concepts without codable children: Secondary | ICD-10-CM

## 2012-01-18 DIAGNOSIS — E66813 Obesity, class 3: Secondary | ICD-10-CM | POA: Insufficient documentation

## 2012-01-18 DIAGNOSIS — E1165 Type 2 diabetes mellitus with hyperglycemia: Secondary | ICD-10-CM

## 2012-01-18 DIAGNOSIS — I10 Essential (primary) hypertension: Secondary | ICD-10-CM

## 2012-01-18 DIAGNOSIS — E785 Hyperlipidemia, unspecified: Secondary | ICD-10-CM

## 2012-01-18 DIAGNOSIS — IMO0002 Reserved for concepts with insufficient information to code with codable children: Secondary | ICD-10-CM

## 2012-01-18 NOTE — Progress Notes (Signed)
  Subjective:    Patient ID: Sierra Johnson, female    DOB: February 13, 1951, 61 y.o.   MRN: [id number]  HPI   C/o wt gain, obesity She has tried diet, exercise (R knee hurts) F/u DM  Review of Systems  Constitutional: Negative for chills, activity change, appetite change, fatigue and unexpected weight change.  HENT: Negative for congestion, mouth sores and sinus pressure.   Eyes: Negative for visual disturbance.  Respiratory: Negative for cough and chest tightness.   Gastrointestinal: Negative for nausea and abdominal pain.  Genitourinary: Negative for frequency, difficulty urinating and vaginal pain.  Musculoskeletal: Negative for back pain and gait problem.  Skin: Negative for pallor and rash.  Neurological: Negative for dizziness, tremors, weakness, numbness and headaches.  Psychiatric/Behavioral: Negative for confusion and disturbed wake/sleep cycle.   Wt Readings from Last 3 Encounters:  01/18/12 243 lb (110.224 kg)  11/27/11 247 lb (112.038 kg)  04/17/11 243 lb (110.224 kg)   BP Readings from Last 3 Encounters:  01/18/12 112/80  11/27/11 110/68  04/17/11 118/82        Objective:   Physical Exam  Constitutional: She appears well-developed. No distress.       Obese   HENT:  Head: Normocephalic.  Right Ear: External ear normal.  Left Ear: External ear normal.  Nose: Nose normal.  Mouth/Throat: Oropharynx is clear and moist.  Eyes: Conjunctivae are normal. Pupils are equal, round, and reactive to light. Right eye exhibits no discharge. Left eye exhibits no discharge.  Neck: Normal range of motion. Neck supple. No JVD present. No tracheal deviation present. No thyromegaly present.  Cardiovascular: Normal rate, regular rhythm and normal heart sounds.   Pulmonary/Chest: No stridor. No respiratory distress. She has no wheezes.  Abdominal: Soft. Bowel sounds are normal. She exhibits no distension and no mass. There is no tenderness. There is no rebound and no guarding.    Musculoskeletal: She exhibits no edema and no tenderness.  Lymphadenopathy:    She has no cervical adenopathy.  Neurological: She displays normal reflexes. No cranial nerve deficit. She exhibits normal muscle tone. Coordination normal.  Skin: No rash noted. No erythema.  Psychiatric: She has a normal mood and affect. Her behavior is normal. Judgment and thought content normal.      Lab Results  Component Value Date   WBC 11.4* 05/06/2010   HGB 12.3 05/06/2010   HCT 36.9 05/06/2010   PLT 245 05/06/2010   GLUCOSE 89 11/17/2011   CHOL 213* 04/12/2011   TRIG 86.0 04/12/2011   HDL 62.80 04/12/2011   LDLDIRECT 142.3 04/12/2011   LDLCALC 125* 03/15/2010   ALT 27 04/12/2011   AST 22 04/12/2011   NA 139 11/17/2011   K 5.2* 11/17/2011   CL 105 11/17/2011   CREATININE 0.9 11/17/2011   BUN 12 11/17/2011   CO2 27 11/17/2011   TSH 3.684 05/11/2010   HGBA1C 7.2* 11/17/2011       Assessment & Plan:

## 2012-01-18 NOTE — Assessment & Plan Note (Signed)
Continue with current prescription therapy as reflected on the Med list.  

## 2012-01-18 NOTE — Patient Instructions (Addendum)
Wt Readings from Last 3 Encounters:  01/18/12 243 lb (110.224 kg)  11/27/11 247 lb (112.038 kg)  04/17/11 243 lb (110.224 kg)

## 2012-01-18 NOTE — Assessment & Plan Note (Addendum)
Lap band would be a great option for [facility] Dietary consult Wt loss discussed

## 2012-01-18 NOTE — Assessment & Plan Note (Signed)
  On diet  

## 2012-02-15 ENCOUNTER — Encounter: Payer: Self-pay | Admitting: *Deleted

## 2012-02-15 ENCOUNTER — Encounter: Payer: BC Managed Care – PPO | Attending: Internal Medicine | Admitting: *Deleted

## 2012-02-15 VITALS — Ht 61.0 in | Wt 245.4 lb

## 2012-02-15 DIAGNOSIS — IMO0002 Reserved for concepts with insufficient information to code with codable children: Secondary | ICD-10-CM

## 2012-02-15 DIAGNOSIS — E1165 Type 2 diabetes mellitus with hyperglycemia: Secondary | ICD-10-CM

## 2012-02-15 DIAGNOSIS — E669 Obesity, unspecified: Secondary | ICD-10-CM | POA: Insufficient documentation

## 2012-02-15 DIAGNOSIS — Z713 Dietary counseling and surveillance: Secondary | ICD-10-CM | POA: Insufficient documentation

## 2012-02-15 DIAGNOSIS — IMO0001 Reserved for inherently not codable concepts without codable children: Secondary | ICD-10-CM | POA: Insufficient documentation

## 2012-02-15 NOTE — Patient Instructions (Signed)
Plan: Aim for 2 Carb choices (30 grams) per meal +/- 1 either way Aim for 0-1 Carb per snack if hungry Check food labels for total carb of foods eaten Check into APP for phone "Carbscontrol" for more nutrition information of foods Consider options for increasing your activity level without hurting your knees

## 2012-02-15 NOTE — Progress Notes (Signed)
  Medical Nutrition Therapy:  Appt start time: 1700 end time:  1800.  Assessment:  Primary concerns today: patient here for obesity and diabetes education. She states she is preparing for Lap Band and has attended the Seminar at [employer]. She states she has had Diabetes for a couple of years but has not yet received any diabetes education. She typically walks often but can't now due to knee pain.  MEDICATIONS: see list   DIETARY INTAKE:  Usual eating pattern includes 2 meals and 0 snacks per day.  Everyday foods include fruits, beans and lean meats.  Avoided foods include breads, cereal, fried foods.    24-hr recall:  B ( AM): watermelon OR smoothie OR oatmeal with blueberries,   Snk ( AM): none  L ( PM): brings from home: beans OR salad OR eat out at [employer]: 6 " sub, water Snk ( PM): none D ( PM): skips most days Snk ( PM): none Beverages: water, sweet tea at sit down restaurant  Usual physical activity: limited due to knee pain, enjoys walking when able  Estimated energy needs: 1200 calories 135 g carbohydrates 90 g protein 33 g fat  Progress Towards Goal(s):  In progress.   Nutritional Diagnosis:  NI-1.5 Excessive energy intake As related to activity level.  As evidenced by BMI of 46.5 .    Intervention:  Nutrition counseling and diabetes education initiated. Discussed basic physiology of diabetes, SMBG and rationale of checking BG at alternate times of day, A1c, Carb Counting and reading food labels, and benefits of increased activity. Suggested she consider biking, spin class, water exercises or Arm Chair exercises as way to increase activity level without hurting her knees.  Plan: Aim for 2 Carb choices (30 grams) per meal +/- 1 either way Aim for 0-1 Carb per snack if hungry Check food labels for total carb of foods eaten Check into APP for phone "Carbscontrol" for more nutrition information of foods Consider options for increasing your activity level  without hurting your knees  Handouts given during visit include: Living Well with Diabetes Carb Counting and Food Label handouts Meal Plan Card  Monitoring/Evaluation:  Dietary intake, exercise, reading food labels, and body weight prn. Patient to contact her insurance company to see what criteria they have for nutrition visits prior to surgery.

## 2012-02-19 ENCOUNTER — Other Ambulatory Visit: Payer: Self-pay | Admitting: Internal Medicine

## 2012-02-23 ENCOUNTER — Ambulatory Visit: Payer: BC Managed Care – PPO | Admitting: Internal Medicine

## 2012-03-08 ENCOUNTER — Other Ambulatory Visit: Payer: BC Managed Care – PPO

## 2012-03-13 ENCOUNTER — Ambulatory Visit (INDEPENDENT_AMBULATORY_CARE_PROVIDER_SITE_OTHER): Payer: Self-pay | Admitting: General Surgery

## 2012-03-15 ENCOUNTER — Ambulatory Visit: Payer: BC Managed Care – PPO | Admitting: Internal Medicine

## 2012-04-30 ENCOUNTER — Other Ambulatory Visit (INDEPENDENT_AMBULATORY_CARE_PROVIDER_SITE_OTHER): Payer: BC Managed Care – PPO

## 2012-04-30 DIAGNOSIS — E669 Obesity, unspecified: Secondary | ICD-10-CM

## 2012-04-30 DIAGNOSIS — E785 Hyperlipidemia, unspecified: Secondary | ICD-10-CM

## 2012-04-30 DIAGNOSIS — IMO0002 Reserved for concepts with insufficient information to code with codable children: Secondary | ICD-10-CM

## 2012-04-30 DIAGNOSIS — I10 Essential (primary) hypertension: Secondary | ICD-10-CM

## 2012-04-30 DIAGNOSIS — Z0000 Encounter for general adult medical examination without abnormal findings: Secondary | ICD-10-CM

## 2012-04-30 DIAGNOSIS — IMO0001 Reserved for inherently not codable concepts without codable children: Secondary | ICD-10-CM

## 2012-04-30 DIAGNOSIS — E538 Deficiency of other specified B group vitamins: Secondary | ICD-10-CM

## 2012-04-30 DIAGNOSIS — E1165 Type 2 diabetes mellitus with hyperglycemia: Secondary | ICD-10-CM

## 2012-04-30 LAB — URINALYSIS
Ketones, ur: NEGATIVE
Specific Gravity, Urine: 1.02 (ref 1.000–1.030)
Total Protein, Urine: NEGATIVE
Urine Glucose: NEGATIVE
pH: 6 (ref 5.0–8.0)

## 2012-04-30 LAB — CBC WITH DIFFERENTIAL/PLATELET
Basophils Absolute: 0.1 10*3/uL (ref 0.0–0.1)
Basophils Relative: 0.5 % (ref 0.0–3.0)
Eosinophils Absolute: 0.2 10*3/uL (ref 0.0–0.7)
HCT: 38.8 % (ref 36.0–46.0)
Hemoglobin: 12.6 g/dL (ref 12.0–15.0)
Lymphocytes Relative: 28.3 % (ref 12.0–46.0)
Lymphs Abs: 3 10*3/uL (ref 0.7–4.0)
MCHC: 32.5 g/dL (ref 30.0–36.0)
Monocytes Relative: 7.7 % (ref 3.0–12.0)
Neutro Abs: 6.6 10*3/uL (ref 1.4–7.7)
RBC: 4.51 Mil/uL (ref 3.87–5.11)
RDW: 15.1 % — ABNORMAL HIGH (ref 11.5–14.6)

## 2012-04-30 LAB — LIPID PANEL
Cholesterol: 219 mg/dL — ABNORMAL HIGH (ref 0–200)
HDL: 64.9 mg/dL (ref >39.00)
Total CHOL/HDL Ratio: 3
Triglycerides: 101 mg/dL (ref 0.0–149.0)
VLDL: 20.2 mg/dL (ref 0.0–40.0)

## 2012-04-30 LAB — BASIC METABOLIC PANEL
CO2: 26 mEq/L (ref 19–32)
Glucose, Bld: 142 mg/dL — ABNORMAL HIGH (ref 70–99)
Potassium: 4.5 mEq/L (ref 3.5–5.1)
Sodium: 137 mEq/L (ref 135–145)

## 2012-04-30 LAB — HEPATIC FUNCTION PANEL
Albumin: 3.7 g/dL (ref 3.5–5.2)
Total Protein: 7.8 g/dL (ref 6.0–8.3)

## 2012-04-30 LAB — LDL CHOLESTEROL, DIRECT: Direct LDL: 131.7 mg/dL

## 2012-04-30 LAB — HEMOGLOBIN A1C: Hgb A1c MFr Bld: 7.1 % — ABNORMAL HIGH (ref 4.6–6.5)

## 2012-04-30 LAB — VITAMIN B12: Vitamin B-12: 769 pg/mL (ref 211–911)

## 2012-04-30 LAB — TSH: TSH: 1.54 u[IU]/mL (ref 0.35–5.50)

## 2012-05-07 ENCOUNTER — Ambulatory Visit (INDEPENDENT_AMBULATORY_CARE_PROVIDER_SITE_OTHER): Payer: BC Managed Care – PPO | Admitting: Internal Medicine

## 2012-05-07 ENCOUNTER — Encounter: Payer: Self-pay | Admitting: Internal Medicine

## 2012-05-07 VITALS — BP 102/74 | Temp 99.3°F | Wt 241.0 lb

## 2012-05-07 DIAGNOSIS — IMO0002 Reserved for concepts with insufficient information to code with codable children: Secondary | ICD-10-CM

## 2012-05-07 DIAGNOSIS — E538 Deficiency of other specified B group vitamins: Secondary | ICD-10-CM

## 2012-05-07 DIAGNOSIS — E1169 Type 2 diabetes mellitus with other specified complication: Secondary | ICD-10-CM | POA: Insufficient documentation

## 2012-05-07 DIAGNOSIS — E785 Hyperlipidemia, unspecified: Secondary | ICD-10-CM

## 2012-05-07 DIAGNOSIS — I10 Essential (primary) hypertension: Secondary | ICD-10-CM

## 2012-05-07 DIAGNOSIS — E119 Type 2 diabetes mellitus without complications: Secondary | ICD-10-CM

## 2012-05-07 DIAGNOSIS — Z0000 Encounter for general adult medical examination without abnormal findings: Secondary | ICD-10-CM

## 2012-05-07 DIAGNOSIS — E1165 Type 2 diabetes mellitus with hyperglycemia: Secondary | ICD-10-CM

## 2012-05-07 DIAGNOSIS — E669 Obesity, unspecified: Secondary | ICD-10-CM | POA: Insufficient documentation

## 2012-05-07 MED ORDER — PRAVASTATIN SODIUM 20 MG PO TABS: 20.0000 mg | ORAL_TABLET | Freq: Every day | ORAL | Status: DC

## 2012-05-07 MED ORDER — METFORMIN HCL 500 MG PO TABS: 500.0000 mg | ORAL_TABLET | Freq: Three times a day (TID) | ORAL | Status: DC

## 2012-05-07 NOTE — Assessment & Plan Note (Signed)
Better Will increase metformin dose

## 2012-05-07 NOTE — Assessment & Plan Note (Signed)
We discussed age appropriate health related issues, including available/recomended screening tests and vaccinations. We discussed a need for adhering to healthy diet and exercise. Labs/EKG were reviewed/ordered. All questions were answered.   

## 2012-05-07 NOTE — Assessment & Plan Note (Signed)
Start Pravachol

## 2012-05-07 NOTE — Assessment & Plan Note (Signed)
Continue with current prescription therapy as reflected on the Med list. BP Readings from Last 3 Encounters:  05/07/12 102/74  01/18/12 112/80  11/27/11 110/68

## 2012-05-07 NOTE — Assessment & Plan Note (Signed)
Better on diet 

## 2012-05-07 NOTE — Progress Notes (Signed)
   Subjective:    Patient ID: Sierra Johnson, female    DOB: 08/23/1950, 61 y.o.   MRN: [id number]  HPI  The patient is here for a wellness exam. The patient has been doing well overall without major physical or psychological issues going on lately.  C/o wt gain, obesity She has tried diet, exercise (R knee hurts) F/u DM  Review of Systems  Constitutional: Negative for chills, activity change, appetite change, fatigue and unexpected weight change.  HENT: Negative for congestion, mouth sores and sinus pressure.   Eyes: Negative for visual disturbance.  Respiratory: Negative for cough and chest tightness.   Gastrointestinal: Negative for nausea and abdominal pain.  Genitourinary: Negative for frequency, difficulty urinating and vaginal pain.  Musculoskeletal: Negative for back pain and gait problem.  Skin: Negative for pallor and rash.  Neurological: Negative for dizziness, tremors, weakness, numbness and headaches.  Psychiatric/Behavioral: Negative for confusion and sleep disturbance.   Wt Readings from Last 3 Encounters:  05/07/12 241 lb (109.317 kg)  02/15/12 245 lb 6.4 oz (111.313 kg)  01/18/12 243 lb (110.224 kg)   BP Readings from Last 3 Encounters:  05/07/12 102/74  01/18/12 112/80  11/27/11 110/68        Objective:   Physical Exam  Constitutional: She appears well-developed. No distress.       Obese   HENT:  Head: Normocephalic.  Right Ear: External ear normal.  Left Ear: External ear normal.  Nose: Nose normal.  Mouth/Throat: Oropharynx is clear and moist.  Eyes: Conjunctivae normal are normal. Pupils are equal, round, and reactive to light. Right eye exhibits no discharge. Left eye exhibits no discharge.  Neck: Normal range of motion. Neck supple. No JVD present. No tracheal deviation present. No thyromegaly present.  Cardiovascular: Normal rate, regular rhythm and normal heart sounds.   Pulmonary/Chest: No stridor. No respiratory distress. She has no  wheezes.  Abdominal: Soft. Bowel sounds are normal. She exhibits no distension and no mass. There is no tenderness. There is no rebound and no guarding.  Musculoskeletal: She exhibits no edema and no tenderness.  Lymphadenopathy:    She has no cervical adenopathy.  Neurological: She displays normal reflexes. No cranial nerve deficit. She exhibits normal muscle tone. Coordination normal.  Skin: No rash noted. No erythema.  Psychiatric: She has a normal mood and affect. Her behavior is normal. Judgment and thought content normal.      Lab Results  Component Value Date   WBC 10.7* 04/30/2012   HGB 12.6 04/30/2012   HCT 38.8 04/30/2012   PLT 293.0 04/30/2012   GLUCOSE 142* 04/30/2012   CHOL 219* 04/30/2012   TRIG 101.0 04/30/2012   HDL 64.90 04/30/2012   LDLDIRECT 131.7 04/30/2012   LDLCALC 125* 03/15/2010   ALT 25 04/30/2012   AST 21 04/30/2012   NA 137 04/30/2012   K 4.5 04/30/2012   CL 102 04/30/2012   CREATININE 1.0 04/30/2012   BUN 17 04/30/2012   CO2 26 04/30/2012   TSH 1.54 04/30/2012   HGBA1C 7.1* 04/30/2012       Assessment & Plan:

## 2012-05-07 NOTE — Assessment & Plan Note (Signed)
Continue with current prescription therapy as reflected on the Med list.  

## 2012-05-07 NOTE — Assessment & Plan Note (Signed)
Better  

## 2012-06-24 ENCOUNTER — Other Ambulatory Visit: Payer: Self-pay | Admitting: Internal Medicine

## 2012-07-04 ENCOUNTER — Telehealth: Payer: Self-pay | Admitting: Internal Medicine

## 2012-07-04 MED ORDER — METFORMIN HCL 500 MG PO TABS: 500.0000 mg | ORAL_TABLET | Freq: Three times a day (TID) | ORAL | Status: DC

## 2012-07-04 NOTE — Telephone Encounter (Signed)
Rx sent to [facility], pt informed.  

## 2012-07-04 NOTE — Telephone Encounter (Signed)
Patient calling to request refill for Glucophage 500mg  take one tablet 3 times daily. .  States that she put in request through pharmacy and the request was declined. Noted to patient that script was written in November with 3 refills and she should not be out of medication at this time.   Patient states that she was given a written script in November and has lost the script and was not able to get medication filled however now needs the medication and does not have the script.  Preferred [facility] [address].   OFFICE NOTE: PLEASE FOLLOW UP ON SCRIPT REQUEST FOR APPROVAL TO REFILL MEDICATION.

## 2012-08-14 ENCOUNTER — Telehealth: Payer: Self-pay | Admitting: Pulmonary Disease

## 2012-08-14 DIAGNOSIS — G4730 Sleep apnea, unspecified: Secondary | ICD-10-CM

## 2012-08-14 NOTE — Telephone Encounter (Signed)
Per [facility] - it is fine with him if we change her DME.  Spoke to the patient and she states that her current DME company is [facility]. Wants a company that has better customer service.  Gets CPAP and all supplies for [facility].

## 2012-08-15 ENCOUNTER — Telehealth: Payer: Self-pay | Admitting: Pulmonary Disease

## 2012-08-15 NOTE — Telephone Encounter (Signed)
Order faxed to [facility] to get pt'd cpsp supplies Sierra Johnson

## 2012-08-15 NOTE — Telephone Encounter (Signed)
Left message for pt dme has been changed from [facility] to [facility] and they will be contacting her Tobe Sos

## 2012-08-16 ENCOUNTER — Telehealth: Payer: Self-pay | Admitting: Pulmonary Disease

## 2012-08-16 NOTE — Telephone Encounter (Signed)
The pt is not happy with [facility]. She went to pick up her cpap supplies and says they did not know A from B and some of the supplies they were trying to give her were not in packages and laid on a dirty table. She does not want to stay with [facility] and is going to check with her insurance company to see if they prefer one DME over another. KC, do you have any thoughts on this? The pt does not expect a call back tonight.

## 2012-08-16 NOTE — Telephone Encounter (Signed)
We will refer her to any company that she wants to go with.

## 2012-08-19 NOTE — Telephone Encounter (Signed)
lmtcb

## 2012-08-21 ENCOUNTER — Encounter (INDEPENDENT_AMBULATORY_CARE_PROVIDER_SITE_OTHER): Payer: BC Managed Care – PPO | Admitting: General Surgery

## 2012-08-21 NOTE — Telephone Encounter (Signed)
[email or web address]. Jennifer Castillo, CMA  

## 2012-08-22 NOTE — Telephone Encounter (Signed)
Called, spoke with pt.  Pt states she is in the process of trying to figure out which DME she would like to go with.  She will call us back so order can be placed to change DMEs once she figures this out.  Will await call back.

## 2012-08-27 ENCOUNTER — Other Ambulatory Visit (INDEPENDENT_AMBULATORY_CARE_PROVIDER_SITE_OTHER): Payer: BC Managed Care – PPO

## 2012-08-27 DIAGNOSIS — E785 Hyperlipidemia, unspecified: Secondary | ICD-10-CM

## 2012-08-27 DIAGNOSIS — I10 Essential (primary) hypertension: Secondary | ICD-10-CM

## 2012-08-27 DIAGNOSIS — IMO0002 Reserved for concepts with insufficient information to code with codable children: Secondary | ICD-10-CM

## 2012-08-27 DIAGNOSIS — IMO0001 Reserved for inherently not codable concepts without codable children: Secondary | ICD-10-CM

## 2012-08-27 DIAGNOSIS — E1165 Type 2 diabetes mellitus with hyperglycemia: Secondary | ICD-10-CM

## 2012-08-27 DIAGNOSIS — E538 Deficiency of other specified B group vitamins: Secondary | ICD-10-CM

## 2012-08-27 LAB — HEPATIC FUNCTION PANEL
AST: 25 U/L (ref 0–37)
Alkaline Phosphatase: 62 U/L (ref 39–117)
Bilirubin, Direct: 0.1 mg/dL (ref 0.0–0.3)

## 2012-08-27 LAB — BASIC METABOLIC PANEL
BUN: 9 mg/dL (ref 6–23)
CO2: 26 mEq/L (ref 19–32)
Calcium: 9.6 mg/dL (ref 8.4–10.5)
Chloride: 101 mEq/L (ref 96–112)
Creatinine, Ser: 1 mg/dL (ref 0.4–1.2)

## 2012-08-27 LAB — LIPID PANEL
Total CHOL/HDL Ratio: 3
VLDL: 16 mg/dL (ref 0.0–40.0)

## 2012-08-28 ENCOUNTER — Telehealth (INDEPENDENT_AMBULATORY_CARE_PROVIDER_SITE_OTHER): Payer: Self-pay | Admitting: General Surgery

## 2012-08-28 ENCOUNTER — Ambulatory Visit (INDEPENDENT_AMBULATORY_CARE_PROVIDER_SITE_OTHER): Payer: BC Managed Care – PPO | Admitting: General Surgery

## 2012-08-28 ENCOUNTER — Encounter (INDEPENDENT_AMBULATORY_CARE_PROVIDER_SITE_OTHER): Payer: Self-pay | Admitting: General Surgery

## 2012-08-28 VITALS — BP 108/72 | HR 79 | Temp 97.4°F | Ht 60.0 in | Wt 227.2 lb

## 2012-08-28 DIAGNOSIS — Z8719 Personal history of other diseases of the digestive system: Secondary | ICD-10-CM | POA: Insufficient documentation

## 2012-08-28 DIAGNOSIS — Z9889 Other specified postprocedural states: Secondary | ICD-10-CM | POA: Insufficient documentation

## 2012-08-28 DIAGNOSIS — K436 Other and unspecified ventral hernia with obstruction, without gangrene: Secondary | ICD-10-CM

## 2012-08-28 NOTE — Progress Notes (Signed)
Patient ID: Sierra Johnson, female   DOB: 1950/07/23, 62 y.o.   MRN: [id number]  Chief Complaint  Patient presents with  . New Evaluation    eval hiatal hernia    HPI Sierra Johnson is a 62 y.o. female.  Pain at site of previous emergent hernia repair HPI Patient is status post emergent operative repair of incarcerated ventral hernia and November of 2011. She mainly has pain in the site. She occasionally feels a mass there as well. The pain comes and goes. She has previously canceled appointments due to resolution of the symptoms. She has not had any pain recently but still wanted to get checked out. Dr. Posey Rea is her primary care physician and asked me to reevaluate her in consultation. Past Medical History  Diagnosis Date  . ANEMIA-NOS 04/24/2007  . HYPERLIPIDEMIA 07/17/2007    mild  . VITAMIN B12 DEFICIENCY 04/24/2007  . DIABETES MELLITUS, TYPE II 04/24/2007  . OBSTRUCTIVE SLEEP APNEA 09/30/2009    on CPAP 2011  . HYPERTENSION 04/24/2007  . Menopause 1998    Dr. Henderson Cloud  . Elevated glucose 2011    Past Surgical History  Procedure Laterality Date  . Abdominal hysterectomy    . Hernia repair  11/11    Incar. midline hernia    Family History  Problem Relation Age of Onset  . Hypertension Other   . Heart disease Mother 73    ?CAD  . Cancer Father     lung  . Heart disease Brother 82    CAD    Social History History  Substance Use Topics  . Smoking status: Never Smoker   . Smokeless tobacco: Never Used  . Alcohol Use: No    Allergies  Allergen Reactions  . Hydrocodone     REACTION: nausea  . Oxycodone Hcl     REACTION: nausea  . Tetracycline     Current Outpatient Prescriptions  Medication Sig Dispense Refill  . benazepril (LOTENSIN) 10 MG tablet TAKE 2 TABLETS BY MOUTH EVERY DAY AT BEDTIME  180 tablet  1  . Cholecalciferol (VITAMIN D3) 1000 UNITS CAPS Take 2 capsules by mouth daily.        . fish oil-omega-3 fatty acids 1000 MG capsule Take 1 capsule by  mouth 2 (two) times daily.        . fluticasone (FLONASE) 50 MCG/ACT nasal spray Place 2 sprays into the nose daily.  16 g  6  . loratadine (CLARITIN) 10 MG tablet Take 10 mg by mouth daily as needed.        . metFORMIN (GLUCOPHAGE) 500 MG tablet Take 1 tablet (500 mg total) by mouth 3 (three) times daily.  270 tablet  2  . Naproxen-Esomeprazole (VIMOVO) 500-20 MG TBEC Take 1 tablet by mouth daily. Two times a day as needed for pain  60 tablet  3  . pravastatin (PRAVACHOL) 20 MG tablet Take 1 tablet (20 mg total) by mouth daily.  90 tablet  3  . vitamin B-12 (CYANOCOBALAMIN) 1000 MCG tablet Take 1,000 mcg by mouth daily.         No current facility-administered medications for this visit.    Review of Systems Review of Systems  Constitutional: Negative for fever, chills and unexpected weight change.  HENT: Negative for hearing loss, congestion, sore throat, trouble swallowing and voice change.   Eyes: Negative for visual disturbance.  Respiratory: Negative for cough and wheezing.   Cardiovascular: Negative for chest pain, palpitations and leg swelling.  Gastrointestinal:  Positive for abdominal pain. Negative for nausea, vomiting, diarrhea, constipation, blood in stool, abdominal distention and anal bleeding.       See history of present illness  Genitourinary: Negative for hematuria, vaginal bleeding and difficulty urinating.  Musculoskeletal: Negative for arthralgias.  Skin: Negative for rash and wound.  Neurological: Negative for seizures, syncope and headaches.  Hematological: Negative for adenopathy. Does not bruise/bleed easily.  Psychiatric/Behavioral: Negative for confusion.    Blood pressure 108/72, pulse 79, temperature 97.4 F (36.3 C), temperature source Temporal, height 5' (1.524 m), weight 227 lb 3.2 oz (103.057 kg), SpO2 95.00%.  Physical Exam Physical Exam  Constitutional: She is oriented to person, place, and time. She appears well-developed and well-nourished.   HENT:  Head: Normocephalic and atraumatic.  Mouth/Throat: No oropharyngeal exudate.  Eyes: EOM are normal. Pupils are equal, round, and reactive to light. No scleral icterus.  Neck: Normal range of motion.  Cardiovascular: Normal rate and normal heart sounds.   Pulmonary/Chest: Effort normal. No respiratory distress. She has no wheezes. She has no rales.  Abdominal: Soft. She exhibits no distension. There is no tenderness. There is no rebound and no guarding.  Abdominal wall is palpated underneath her supraumbilical scar. I do feel some scar tissue present but no defect is palpable. I also had her stand up. Again there is no obvious recurrent hernia but she does note that this is the location where she occasionally feels a mass and where the penis. There is no pain on examination.  Musculoskeletal: Normal range of motion.  Neurological: She is alert and oriented to person, place, and time.    Data Reviewed Operative note from 2011  Assessment    Status post emergent repair of Incarcerated ventral hernia In 2011. Now with symptoms concerning for recurrent hernia, however, this is not obvious on examination.    Plan    We'll obtain CT scan of the abdomen and pelvis to further evaluate the region. I will contact her with the results and plan accordingly.       THOMPSON,BURKE E 08/28/2012, 10:44 AM

## 2012-08-28 NOTE — Telephone Encounter (Signed)
Spoke with patient she has appt 09/10/12 9:15 gave instructions on contrast

## 2012-09-03 ENCOUNTER — Telehealth: Payer: Self-pay | Admitting: Pulmonary Disease

## 2012-09-03 NOTE — Telephone Encounter (Signed)
Pt is aware that [facility] doesn't having any available appointments until 09/25/2012. She will need a new mask before she is seen. I advised her that since she hasn't been seen in almost 3 years that we would be unable to order a new mask. She is to be seen every year in order to receive new supplies. Pt denies knowing that she needs to be seen every year in order to receive new supplies.  She was unhappy that she can't get a new mask. I advised her again that she must be seen every year in order to receive new supplies.

## 2012-09-04 ENCOUNTER — Ambulatory Visit (INDEPENDENT_AMBULATORY_CARE_PROVIDER_SITE_OTHER): Payer: BC Managed Care – PPO | Admitting: Internal Medicine

## 2012-09-04 ENCOUNTER — Ambulatory Visit: Payer: BC Managed Care – PPO | Admitting: Internal Medicine

## 2012-09-04 ENCOUNTER — Encounter: Payer: Self-pay | Admitting: Internal Medicine

## 2012-09-04 VITALS — BP 128/78 | HR 72 | Temp 98.2°F | Resp 16 | Wt 223.0 lb

## 2012-09-04 DIAGNOSIS — E1169 Type 2 diabetes mellitus with other specified complication: Secondary | ICD-10-CM

## 2012-09-04 DIAGNOSIS — E669 Obesity, unspecified: Secondary | ICD-10-CM

## 2012-09-04 DIAGNOSIS — E119 Type 2 diabetes mellitus without complications: Secondary | ICD-10-CM

## 2012-09-04 DIAGNOSIS — E785 Hyperlipidemia, unspecified: Secondary | ICD-10-CM

## 2012-09-04 DIAGNOSIS — I10 Essential (primary) hypertension: Secondary | ICD-10-CM

## 2012-09-04 DIAGNOSIS — E538 Deficiency of other specified B group vitamins: Secondary | ICD-10-CM

## 2012-09-04 MED ORDER — GLUCOSE BLOOD VI STRP: ORAL_STRIP | Status: DC

## 2012-09-04 MED ORDER — NOVA SAFETY LANCETS 28G MISC: 1.0000 [IU] | Freq: Every day | Status: DC | PRN

## 2012-09-04 NOTE — Assessment & Plan Note (Signed)
Continue with current  therapy as reflected on the Med list.  

## 2012-09-04 NOTE — Assessment & Plan Note (Signed)
Continue with current prescription therapy as reflected on the Med list. BP Readings from Last 3 Encounters:  09/04/12 128/78  08/28/12 108/72  05/07/12 102/74

## 2012-09-04 NOTE — Assessment & Plan Note (Signed)
Continue with current prescription therapy as reflected on the Med list.  

## 2012-09-04 NOTE — Progress Notes (Signed)
   Subjective:    HPI   F/u HTN, obesity She has tried diet, exercise (R knee hurts) F/u DM  Review of Systems  Constitutional: Negative for chills, activity change, appetite change, fatigue and unexpected weight change.  HENT: Negative for congestion, mouth sores and sinus pressure.   Eyes: Negative for visual disturbance.  Respiratory: Negative for cough and chest tightness.   Gastrointestinal: Negative for nausea and abdominal pain.  Genitourinary: Negative for frequency, difficulty urinating and vaginal pain.  Musculoskeletal: Negative for back pain and gait problem.  Skin: Negative for pallor and rash.  Neurological: Negative for dizziness, tremors, weakness, numbness and headaches.  Psychiatric/Behavioral: Negative for confusion and sleep disturbance.   Wt Readings from Last 3 Encounters:  09/04/12 223 lb (101.152 kg)  08/28/12 227 lb 3.2 oz (103.057 kg)  05/07/12 241 lb (109.317 kg)   BP Readings from Last 3 Encounters:  09/04/12 128/78  08/28/12 108/72  05/07/12 102/74        Objective:   Physical Exam  Constitutional: She appears well-developed. No distress.  Obese   HENT:  Head: Normocephalic.  Right Ear: External ear normal.  Left Ear: External ear normal.  Nose: Nose normal.  Mouth/Throat: Oropharynx is clear and moist.  Eyes: Conjunctivae are normal. Pupils are equal, round, and reactive to light. Right eye exhibits no discharge. Left eye exhibits no discharge.  Neck: Normal range of motion. Neck supple. No JVD present. No tracheal deviation present. No thyromegaly present.  Cardiovascular: Normal rate, regular rhythm and normal heart sounds.   Pulmonary/Chest: No stridor. No respiratory distress. She has no wheezes.  Abdominal: Soft. Bowel sounds are normal. She exhibits no distension and no mass. There is no tenderness. There is no rebound and no guarding.  Musculoskeletal: She exhibits no edema and no tenderness.  Lymphadenopathy:    She has no  cervical adenopathy.  Neurological: She displays normal reflexes. No cranial nerve deficit. She exhibits normal muscle tone. Coordination normal.  Skin: No rash noted. No erythema.  Psychiatric: She has a normal mood and affect. Her behavior is normal. Judgment and thought content normal.      Lab Results  Component Value Date   WBC 10.7* 04/30/2012   HGB 12.6 04/30/2012   HCT 38.8 04/30/2012   PLT 293.0 04/30/2012   GLUCOSE 115* 08/27/2012   CHOL 202* 08/27/2012   TRIG 80.0 08/27/2012   HDL 62.50 08/27/2012   LDLDIRECT 119.5 08/27/2012   LDLCALC 125* 03/15/2010   ALT 23 08/27/2012   AST 25 08/27/2012   NA 137 08/27/2012   K 4.3 08/27/2012   CL 101 08/27/2012   CREATININE 1.0 08/27/2012   BUN 9 08/27/2012   CO2 26 08/27/2012   TSH 1.54 04/30/2012   HGBA1C 6.8* 08/27/2012       Assessment & Plan:

## 2012-09-04 NOTE — Assessment & Plan Note (Signed)
Better Continue with current prescription therapy as reflected on the Med list.  

## 2012-09-04 NOTE — Assessment & Plan Note (Signed)
Wt Readings from Last 3 Encounters:  09/04/12 223 lb (101.152 kg)  08/28/12 227 lb 3.2 oz (103.057 kg)  05/07/12 241 lb (109.317 kg)

## 2012-09-05 ENCOUNTER — Ambulatory Visit: Payer: BC Managed Care – PPO | Admitting: Pulmonary Disease

## 2012-09-09 ENCOUNTER — Telehealth: Payer: Self-pay | Admitting: Internal Medicine

## 2012-09-09 NOTE — Telephone Encounter (Signed)
The test machine patient was given is not covered by her insurance, the only meters they cover are the Accucheck or OneTouch, patient is requesting one of these meters and the scripts for strips and needles

## 2012-09-10 ENCOUNTER — Ambulatory Visit
Admission: RE | Admit: 2012-09-10 | Discharge: 2012-09-10 | Disposition: A | Discharge status: Home / Self Care | Payer: BC Managed Care – PPO | Source: Ambulatory Visit | Attending: General Surgery | Admitting: General Surgery

## 2012-09-10 DIAGNOSIS — Z8719 Personal history of other diseases of the digestive system: Secondary | ICD-10-CM

## 2012-09-10 MED ORDER — ONETOUCH ULTRASOFT LANCETS MISC: Status: DC

## 2012-09-10 MED ORDER — IOHEXOL 300 MG/ML  SOLN
100.0000 mL | Freq: Once | INTRAMUSCULAR | Status: AC | PRN
  Administered 2012-09-10: 125 mL via INTRAVENOUS

## 2012-09-10 MED ORDER — GLUCOSE BLOOD VI STRP: ORAL_STRIP | Status: DC

## 2012-09-10 MED ORDER — ONETOUCH ULTRA 2 W/DEVICE KIT: PACK | Status: DC

## 2012-09-10 NOTE — Telephone Encounter (Signed)
RX for [place], and supplies sent to pharmacy, medication list updated

## 2012-09-19 ENCOUNTER — Telehealth: Payer: Self-pay | Admitting: General Surgery

## 2012-09-19 NOTE — Telephone Encounter (Signed)
I called patient and notified her of her CT results showing abdominal wall hernia.  She will make an appointment to see me in the office to discuss the plan.

## 2012-09-25 ENCOUNTER — Institutional Professional Consult (permissible substitution): Payer: BC Managed Care – PPO | Admitting: Pulmonary Disease

## 2012-10-16 ENCOUNTER — Ambulatory Visit (INDEPENDENT_AMBULATORY_CARE_PROVIDER_SITE_OTHER): Payer: BC Managed Care – PPO | Admitting: General Surgery

## 2012-10-16 ENCOUNTER — Encounter (INDEPENDENT_AMBULATORY_CARE_PROVIDER_SITE_OTHER): Payer: Self-pay | Admitting: General Surgery

## 2012-10-16 VITALS — BP 120/64 | HR 66 | Temp 97.2°F | Resp 18 | Ht 60.0 in | Wt 227.0 lb

## 2012-10-16 DIAGNOSIS — K432 Incisional hernia without obstruction or gangrene: Secondary | ICD-10-CM

## 2012-10-16 NOTE — Progress Notes (Signed)
Subjective:     Patient ID: Sierra Johnson, female   DOB: March 23, 1951, 62 y.o.   MRN: [id number]  HPI Patient underwent CT A/P to evaluate for recurrent incisional hernia.  I discussed the results with her previously on the phone. She does have a recurrent hernia, containing only omentum. Her symptoms have been stable. It pops in and out spontaneously. She occasionally has discomfort in the area but this has not been severe. No change in bowel or bladder habits.  Review of Systems     Objective:   Physical Exam  Constitutional: She is oriented to person, place, and time. She appears well-developed and well-nourished.  HENT:  Head: Normocephalic and atraumatic.  Eyes: EOM are normal. Pupils are equal, round, and reactive to light.  Neck: Normal range of motion. No tracheal deviation present.  Cardiovascular: Normal rate, regular rhythm, normal heart sounds and intact distal pulses.   Pulmonary/Chest: Effort normal and breath sounds normal. No stridor. No respiratory distress. She has no wheezes. She has no rales.  Abdominal: Soft. She exhibits no distension. There is no tenderness. There is no rebound.    Hernia in the supraumbilical region to the left of midline reduces easily, no tenderness on palpation  Musculoskeletal: She exhibits no edema and no tenderness.  Neurological: She is alert and oriented to person, place, and time. She exhibits normal muscle tone.  Skin: Skin is warm.       Assessment:     Recurrent incisional hernia    Plan:     I have offered laparoscopic repair of recurrent incisional hernia with mesh. Procedure, risks, and benefits were discussed in detail with the patient. We also discussed possible need for conversion to open approach. She is planning on going on a cruise late this summer. 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  She is  well informed of the signs and symptoms of incarceration and strangulation. She agrees to call us in the meantime should things worsen. I will see her back in July for reevaluation and to formally schedule surgery. I also spoke with her husband and answered his questions.

## 2012-10-16 NOTE — Patient Instructions (Signed)
Please call in the interim should your symptoms change

## 2012-10-30 ENCOUNTER — Ambulatory Visit: Payer: BC Managed Care – PPO | Admitting: Pulmonary Disease

## 2012-10-31 ENCOUNTER — Ambulatory Visit (INDEPENDENT_AMBULATORY_CARE_PROVIDER_SITE_OTHER): Payer: BC Managed Care – PPO | Admitting: Pulmonary Disease

## 2012-10-31 ENCOUNTER — Encounter: Payer: Self-pay | Admitting: Pulmonary Disease

## 2012-10-31 VITALS — BP 138/80 | HR 74 | Temp 98.0°F | Ht 61.0 in | Wt 230.2 lb

## 2012-10-31 DIAGNOSIS — G4733 Obstructive sleep apnea (adult) (pediatric): Secondary | ICD-10-CM

## 2012-10-31 NOTE — Patient Instructions (Addendum)
Will have your pressure optimized on the automatic setting for the next few weeks, and will let you know the optimal pressure once I receive your download. Keep up with mask changes and supplies. Work on weight loss followup with me in one year.

## 2012-10-31 NOTE — Assessment & Plan Note (Signed)
The patient has a history of mild sleep apnea, and has been on CPAP since diagnosis.  However, she has been noncompliant with her followup visits, and has not kept up with her mask changes for supplies.  She also never had her pressure optimized.  I would like to use the automatic setting to optimize her pressure, and we'll also make sure that her supplies are up to date.  I have congratulated her on her weight loss, and asked her continue doing so.  I have also stressed to her the importance of following up with me on a yearly basis. Care Plan:  At this point, will arrange for the patient's machine to be changed over to auto mode for 2 weeks to optimize their pressure.  I will review the downloaded data once sent by dme, and also evaluate for compliance, leaks, and residual osa.  I will call the patient and dme to discuss the results, and have the patient's machine set appropriately.  This will serve as the pt's cpap pressure titration.

## 2012-10-31 NOTE — Progress Notes (Signed)
  Subjective:    Patient ID: Sierra Johnson, female    DOB: 08-Jul-1950, 62 y.o.   MRN: [id number]  HPI The patient comes in today for followup of her obstructive sleep apnea.  She has not been seen in 3 years, since her initial diagnosis.  She obviously has not kept up with her mass changes and supplies, and never had her pressure optimized.  She tells me that she is wearing CPAP compliantly, and has no issues with her mask fit or pressure.  She feels that she sleeps fairly well, and is satisfied with her daytime alertness.  However, she also states that she is unsure if her pressure is set correctly.  Of note, her weight has actually decreased over 10 pounds since the last visit 3 years ago.   Review of Systems  Constitutional: Negative for fever and unexpected weight change.  HENT: Negative for ear pain, nosebleeds, congestion, sore throat, rhinorrhea, sneezing, trouble swallowing, dental problem, postnasal drip and sinus pressure.   Eyes: Negative for redness and itching.  Respiratory: Negative for cough, chest tightness, shortness of breath and wheezing.   Cardiovascular: Negative for palpitations and leg swelling.  Gastrointestinal: Negative for nausea and vomiting.  Genitourinary: Negative for dysuria.  Musculoskeletal: Negative for joint swelling.  Skin: Negative for rash.  Neurological: Negative for headaches.  Hematological: Does not bruise/bleed easily.  Psychiatric/Behavioral: Negative for dysphoric mood. The patient is not nervous/anxious.        Objective:   Physical Exam Morbidly obese female in no acute distress Nose without purulence or discharge noted No skin breakdown or pressure necrosis from the CPAP mask Neck without lymphadenopathy or thyromegaly Chest totally clear to auscultation Cardiac exam with regular rate and rhythm Lower extremities with minimal edema, no cyanosis Alert, does not appear to be sleepy, moves all 4 extremities.       Assessment & Plan:

## 2012-11-14 ENCOUNTER — Telehealth: Payer: Self-pay | Admitting: Pulmonary Disease

## 2012-11-14 DIAGNOSIS — G4733 Obstructive sleep apnea (adult) (pediatric): Secondary | ICD-10-CM

## 2012-11-14 NOTE — Telephone Encounter (Signed)
Spoke with pt She states having issues with CPAP  Currently on auto setting and feels like she is not getting enough air She falls asleep okay, and then wakes up approx 2 hrs later and feels like she can not breathe well  No mask issues Please advise recs thanks!

## 2012-11-14 NOTE — Telephone Encounter (Signed)
Need to have dme download her device on auto so we can determine her optimal pressure.,  Hopefully as worn enough to do this.

## 2012-11-14 NOTE — Telephone Encounter (Signed)
LMTCB

## 2012-11-14 NOTE — Telephone Encounter (Signed)
Returning call can be reached at [phone].Sierra Johnson

## 2012-11-14 NOTE — Telephone Encounter (Signed)
Spoke with pt and notified of recs per [facility] She verbalized understanding Order was sent to [facility] for download

## 2012-11-20 ENCOUNTER — Ambulatory Visit: Payer: BC Managed Care – PPO | Admitting: Pulmonary Disease

## 2013-01-08 ENCOUNTER — Other Ambulatory Visit: Payer: BC Managed Care – PPO

## 2013-01-08 ENCOUNTER — Other Ambulatory Visit (INDEPENDENT_AMBULATORY_CARE_PROVIDER_SITE_OTHER): Payer: BC Managed Care – PPO

## 2013-01-08 ENCOUNTER — Encounter: Payer: Self-pay | Admitting: Internal Medicine

## 2013-01-08 ENCOUNTER — Ambulatory Visit (INDEPENDENT_AMBULATORY_CARE_PROVIDER_SITE_OTHER): Payer: BC Managed Care – PPO | Admitting: Internal Medicine

## 2013-01-08 VITALS — BP 118/78 | HR 76 | Temp 98.0°F | Resp 16 | Wt 229.0 lb

## 2013-01-08 DIAGNOSIS — I10 Essential (primary) hypertension: Secondary | ICD-10-CM

## 2013-01-08 DIAGNOSIS — E669 Obesity, unspecified: Secondary | ICD-10-CM

## 2013-01-08 DIAGNOSIS — E119 Type 2 diabetes mellitus without complications: Secondary | ICD-10-CM

## 2013-01-08 DIAGNOSIS — E538 Deficiency of other specified B group vitamins: Secondary | ICD-10-CM

## 2013-01-08 DIAGNOSIS — E1169 Type 2 diabetes mellitus with other specified complication: Secondary | ICD-10-CM

## 2013-01-08 DIAGNOSIS — E785 Hyperlipidemia, unspecified: Secondary | ICD-10-CM

## 2013-01-08 DIAGNOSIS — Z0000 Encounter for general adult medical examination without abnormal findings: Secondary | ICD-10-CM

## 2013-01-08 LAB — BASIC METABOLIC PANEL
BUN: 15 mg/dL (ref 6–23)
CO2: 27 mEq/L (ref 19–32)
Calcium: 9.8 mg/dL (ref 8.4–10.5)
Creatinine, Ser: 1 mg/dL (ref 0.4–1.2)
GFR: 70.39 mL/min (ref >60.00)
Glucose, Bld: 118 mg/dL — ABNORMAL HIGH (ref 70–99)

## 2013-01-08 MED ORDER — METFORMIN HCL 500 MG PO TABS: 500.0000 mg | ORAL_TABLET | Freq: Two times a day (BID) | ORAL | Status: DC

## 2013-01-08 MED ORDER — FLUTICASONE PROPIONATE 50 MCG/ACT NA SUSP: 2.0000 | Freq: Every day | NASAL | Status: DC

## 2013-01-08 NOTE — Assessment & Plan Note (Signed)
Continue with current prescription therapy as reflected on the Med list.  

## 2013-01-08 NOTE — Progress Notes (Signed)
Patient ID: Sierra Johnson, female   DOB: May 09, 1951, 62 y.o.   MRN: [id number]   Subjective:    HPI  C/o allergies, runny nose... F/u HTN, obesity She has tried diet, exercise (R knee hurts) F/u DM  Review of Systems  Constitutional: Negative for chills, activity change, appetite change, fatigue and unexpected weight change.  HENT: Negative for congestion, mouth sores and sinus pressure.   Eyes: Negative for visual disturbance.  Respiratory: Negative for cough and chest tightness.   Gastrointestinal: Negative for nausea and abdominal pain.  Genitourinary: Negative for frequency, difficulty urinating and vaginal pain.  Musculoskeletal: Negative for back pain and gait problem.  Skin: Negative for pallor and rash.  Neurological: Negative for dizziness, tremors, weakness, numbness and headaches.  Psychiatric/Behavioral: Negative for confusion and sleep disturbance.   Wt Readings from Last 3 Encounters:  01/08/13 229 lb (103.874 kg)  10/31/12 230 lb 3.2 oz (104.418 kg)  10/16/12 227 lb (102.967 kg)   BP Readings from Last 3 Encounters:  01/08/13 118/78  10/31/12 138/80  10/16/12 120/64        Objective:   Physical Exam  Constitutional: She appears well-developed. No distress.  Obese   HENT:  Head: Normocephalic.  Right Ear: External ear normal.  Left Ear: External ear normal.  Nose: Nose normal.  Mouth/Throat: Oropharynx is clear and moist.  Eyes: Conjunctivae are normal. Pupils are equal, round, and reactive to light. Right eye exhibits no discharge. Left eye exhibits no discharge.  Neck: Normal range of motion. Neck supple. No JVD present. No tracheal deviation present. No thyromegaly present.  Cardiovascular: Normal rate, regular rhythm and normal heart sounds.   Pulmonary/Chest: No stridor. No respiratory distress. She has no wheezes.  Abdominal: Soft. Bowel sounds are normal. She exhibits no distension and no mass. There is no tenderness. There is no rebound and no  guarding.  Musculoskeletal: She exhibits no edema and no tenderness.  Lymphadenopathy:    She has no cervical adenopathy.  Neurological: She displays normal reflexes. No cranial nerve deficit. She exhibits normal muscle tone. Coordination normal.  Skin: No rash noted. No erythema.  Psychiatric: She has a normal mood and affect. Her behavior is normal. Judgment and thought content normal.  clear nasal d/c    Lab Results  Component Value Date   WBC 10.7* 04/30/2012   HGB 12.6 04/30/2012   HCT 38.8 04/30/2012   PLT 293.0 04/30/2012   GLUCOSE 115* 08/27/2012   CHOL 202* 08/27/2012   TRIG 80.0 08/27/2012   HDL 62.50 08/27/2012   LDLDIRECT 119.5 08/27/2012   LDLCALC 125* 03/15/2010   ALT 23 08/27/2012   AST 25 08/27/2012   NA 137 08/27/2012   K 4.3 08/27/2012   CL 101 08/27/2012   CREATININE 1.0 08/27/2012   BUN 9 08/27/2012   CO2 26 08/27/2012   TSH 1.54 04/30/2012   HGBA1C 6.8* 08/27/2012       Assessment & Plan:

## 2013-01-08 NOTE — Assessment & Plan Note (Signed)
Continue with current prescription therapy as reflected on the Med list. Labs  

## 2013-01-08 NOTE — Assessment & Plan Note (Signed)
Doing better.   

## 2013-04-24 ENCOUNTER — Other Ambulatory Visit: Payer: Self-pay

## 2013-04-25 ENCOUNTER — Encounter: Payer: Self-pay | Admitting: Internal Medicine

## 2013-04-25 ENCOUNTER — Other Ambulatory Visit (INDEPENDENT_AMBULATORY_CARE_PROVIDER_SITE_OTHER): Payer: BC Managed Care – PPO

## 2013-04-25 ENCOUNTER — Ambulatory Visit: Payer: BC Managed Care – PPO | Admitting: Internal Medicine

## 2013-04-25 ENCOUNTER — Ambulatory Visit (INDEPENDENT_AMBULATORY_CARE_PROVIDER_SITE_OTHER): Payer: BC Managed Care – PPO | Admitting: Internal Medicine

## 2013-04-25 VITALS — BP 140/84 | HR 72 | Temp 97.8°F | Resp 16 | Ht 61.0 in | Wt 224.0 lb

## 2013-04-25 DIAGNOSIS — I10 Essential (primary) hypertension: Secondary | ICD-10-CM

## 2013-04-25 DIAGNOSIS — E1169 Type 2 diabetes mellitus with other specified complication: Secondary | ICD-10-CM

## 2013-04-25 DIAGNOSIS — E669 Obesity, unspecified: Secondary | ICD-10-CM

## 2013-04-25 DIAGNOSIS — Z0000 Encounter for general adult medical examination without abnormal findings: Secondary | ICD-10-CM

## 2013-04-25 DIAGNOSIS — E119 Type 2 diabetes mellitus without complications: Secondary | ICD-10-CM

## 2013-04-25 DIAGNOSIS — E538 Deficiency of other specified B group vitamins: Secondary | ICD-10-CM

## 2013-04-25 DIAGNOSIS — G4733 Obstructive sleep apnea (adult) (pediatric): Secondary | ICD-10-CM

## 2013-04-25 LAB — HEPATIC FUNCTION PANEL
ALT: 19 U/L (ref 0–35)
AST: 19 U/L (ref 0–37)
Alkaline Phosphatase: 65 U/L (ref 39–117)
Bilirubin, Direct: 0.1 mg/dL (ref 0.0–0.3)
Total Bilirubin: 0.7 mg/dL (ref 0.3–1.2)
Total Protein: 7.8 g/dL (ref 6.0–8.3)

## 2013-04-25 LAB — CBC WITH DIFFERENTIAL/PLATELET
Basophils Relative: 0.4 % (ref 0.0–3.0)
Eosinophils Absolute: 0.1 10*3/uL (ref 0.0–0.7)
Eosinophils Relative: 1.2 % (ref 0.0–5.0)
HCT: 37.7 % (ref 36.0–46.0)
Hemoglobin: 12.6 g/dL (ref 12.0–15.0)
Lymphocytes Relative: 26.1 % (ref 12.0–46.0)
Neutrophils Relative %: 64.9 % (ref 43.0–77.0)
RBC: 4.5 Mil/uL (ref 3.87–5.11)
WBC: 10.6 10*3/uL — ABNORMAL HIGH (ref 4.5–10.5)

## 2013-04-25 LAB — URINALYSIS, ROUTINE W REFLEX MICROSCOPIC
Leukocytes, UA: NEGATIVE
Nitrite: NEGATIVE
Specific Gravity, Urine: 1.02 (ref 1.000–1.030)
Urobilinogen, UA: 0.2 (ref 0.0–1.0)
pH: 7 (ref 5.0–8.0)

## 2013-04-25 LAB — BASIC METABOLIC PANEL
BUN: 15 mg/dL (ref 6–23)
CO2: 28 mEq/L (ref 19–32)
Calcium: 9.7 mg/dL (ref 8.4–10.5)
Creatinine, Ser: 0.9 mg/dL (ref 0.4–1.2)
Sodium: 138 mEq/L (ref 135–145)

## 2013-04-25 LAB — VITAMIN B12: Vitamin B-12: 943 pg/mL — ABNORMAL HIGH (ref 211–911)

## 2013-04-25 LAB — TSH: TSH: 1.26 u[IU]/mL (ref 0.35–5.50)

## 2013-04-25 LAB — MICROALBUMIN / CREATININE URINE RATIO: Microalb Creat Ratio: 8.9 mg/g (ref 0.0–30.0)

## 2013-04-25 MED ORDER — BENAZEPRIL HCL 10 MG PO TABS: ORAL_TABLET | ORAL | Status: DC

## 2013-04-25 MED ORDER — GLUCOSE BLOOD VI STRP: ORAL_STRIP | Status: DC

## 2013-04-25 MED ORDER — METFORMIN HCL 500 MG PO TABS: 500.0000 mg | ORAL_TABLET | Freq: Two times a day (BID) | ORAL | Status: DC

## 2013-04-25 MED ORDER — ONETOUCH LANCETS MISC: Status: DC

## 2013-04-25 NOTE — Progress Notes (Signed)
Pre visit review using our clinic review tool, if applicable. No additional management support is needed unless otherwise documented below in the visit note. 

## 2013-04-25 NOTE — Assessment & Plan Note (Signed)
Continue with current prescription therapy as reflected on the Med list. Ran out of meds

## 2013-04-25 NOTE — Assessment & Plan Note (Signed)
Continue with current prescription therapy as reflected on the Med list.  

## 2013-04-25 NOTE — Progress Notes (Signed)
   Subjective:   HPI  The patient is here for a wellness exam. The patient has been doing well overall without major physical or psychological issues going on lately.  C/o wt gain, obesity  F/u DM  Review of Systems  Constitutional: Negative for chills, activity change, appetite change, fatigue and unexpected weight change.  HENT: Negative for congestion, mouth sores and sinus pressure.   Eyes: Negative for visual disturbance.  Respiratory: Negative for cough and chest tightness.   Gastrointestinal: Negative for nausea and abdominal pain.  Genitourinary: Negative for frequency, difficulty urinating and vaginal pain.  Musculoskeletal: Negative for back pain and gait problem.  Skin: Negative for pallor and rash.  Neurological: Negative for dizziness, tremors, weakness, numbness and headaches.  Psychiatric/Behavioral: Negative for confusion and sleep disturbance.   Wt Readings from Last 3 Encounters:  04/25/13 224 lb (101.606 kg)  01/08/13 229 lb (103.874 kg)  10/31/12 230 lb 3.2 oz (104.418 kg)   BP Readings from Last 3 Encounters:  04/25/13 140/84  01/08/13 118/78  10/31/12 138/80        Objective:   Physical Exam  Constitutional: She appears well-developed. No distress.  Obese   HENT:  Head: Normocephalic.  Right Ear: External ear normal.  Left Ear: External ear normal.  Nose: Nose normal.  Mouth/Throat: Oropharynx is clear and moist.  Eyes: Conjunctivae are normal. Pupils are equal, round, and reactive to light. Right eye exhibits no discharge. Left eye exhibits no discharge.  Neck: Normal range of motion. Neck supple. No JVD present. No tracheal deviation present. No thyromegaly present.  Cardiovascular: Normal rate, regular rhythm and normal heart sounds.   Pulmonary/Chest: No stridor. No respiratory distress. She has no wheezes.  Abdominal: Soft. Bowel sounds are normal. She exhibits no distension and no mass. There is no tenderness. There is no rebound and no  guarding.  Musculoskeletal: She exhibits no edema and no tenderness.  Lymphadenopathy:    She has no cervical adenopathy.  Neurological: She displays normal reflexes. No cranial nerve deficit. She exhibits normal muscle tone. Coordination normal.  Skin: No rash noted. No erythema.  Psychiatric: She has a normal mood and affect. Her behavior is normal. Judgment and thought content normal.      Lab Results  Component Value Date   WBC 10.7* 04/30/2012   HGB 12.6 04/30/2012   HCT 38.8 04/30/2012   PLT 293.0 04/30/2012   GLUCOSE 118* 01/08/2013   CHOL 202* 08/27/2012   TRIG 80.0 08/27/2012   HDL 62.50 08/27/2012   LDLDIRECT 119.5 08/27/2012   LDLCALC 125* 03/15/2010   ALT 23 08/27/2012   AST 25 08/27/2012   NA 137 01/08/2013   K 4.2 01/08/2013   CL 102 01/08/2013   CREATININE 1.0 01/08/2013   BUN 15 01/08/2013   CO2 27 01/08/2013   TSH 1.54 04/30/2012   HGBA1C 6.8* 01/08/2013       Assessment & Plan:

## 2013-04-25 NOTE — Assessment & Plan Note (Signed)
We discussed age appropriate health related issues, including available/recomended screening tests and vaccinations. We discussed a need for adhering to healthy diet and exercise. Labs/EKG were reviewed/ordered. All questions were answered.   

## 2013-04-25 NOTE — Assessment & Plan Note (Signed)
On CPAP. ?

## 2013-04-28 LAB — NMR LIPOPROFILE WITHOUT LIPIDS
HDL Particle Number: 41.6 umol/L (ref >=30.5)
HDL Size: 9.4 nm (ref >=9.2)
LDL Size: 20.9 nm (ref >20.5)
LP-IR Score: 33 (ref <=45)
Large VLDL-P: 2.3 nmol/L (ref <=2.7)
Small LDL Particle Number: 626 nmol/L — ABNORMAL HIGH (ref <=527)

## 2013-05-22 ENCOUNTER — Ambulatory Visit: Payer: BC Managed Care – PPO | Admitting: Internal Medicine

## 2013-09-24 ENCOUNTER — Ambulatory Visit (INDEPENDENT_AMBULATORY_CARE_PROVIDER_SITE_OTHER): Payer: BC Managed Care – PPO | Admitting: General Surgery

## 2013-09-24 ENCOUNTER — Encounter (INDEPENDENT_AMBULATORY_CARE_PROVIDER_SITE_OTHER): Payer: Self-pay | Admitting: General Surgery

## 2013-09-24 VITALS — BP 140/90 | HR 78 | Temp 97.2°F | Resp 16 | Ht 59.0 in | Wt 237.8 lb

## 2013-09-24 DIAGNOSIS — K432 Incisional hernia without obstruction or gangrene: Secondary | ICD-10-CM

## 2013-09-24 NOTE — Progress Notes (Signed)
Patient ID: Sierra Johnson, female   DOB: [birth year], 63 y.o.   MRN: [id number]  Chief Complaint  Patient presents with  . Follow-up    LTFU hernia    HPI Sierra Johnson is a 63 y.o. female.  Chief complaint: Incisional hernia HPI Patient is known to me status post evaluation for incisional hernia in 2014. She had work obligations and put off repair at that time. She has a history of emergent repair of incarcerated incisional hernia in 2011. She developed this recurrence which has gradually gotten bigger. It goes in and out spontaneously. She is easily able to reduce it when it pops out at other times. No change in bowel or bladder habits. She had some generalized abdominal pain a few days but this resolved spontaneously. Past Medical History  Diagnosis Date  . ANEMIA-NOS 04/24/2007  . HYPERLIPIDEMIA 07/17/2007    mild  . VITAMIN B12 DEFICIENCY 04/24/2007  . DIABETES MELLITUS, TYPE II 04/24/2007  . OBSTRUCTIVE SLEEP APNEA 09/30/2009    on CPAP 2011  . HYPERTENSION 04/24/2007  . Menopause 1998    Dr. Gaetano Net  . Elevated glucose 2011    Past Surgical History  Procedure Laterality Date  . Abdominal hysterectomy    . Hernia repair  11/11    Incar. midline hernia    Family History  Problem Relation Age of Onset  . Hypertension Other   . Heart disease Mother 83    ?CAD  . Cancer Father     lung  . Heart disease Brother 99    CAD    Social History History  Substance Use Topics  . Smoking status: Never Smoker   . Smokeless tobacco: Never Used  . Alcohol Use: No    Allergies  Allergen Reactions  . Hydrocodone     REACTION: nausea  . Oxycodone Hcl     REACTION: nausea  . Tetracycline     Current Outpatient Prescriptions  Medication Sig Dispense Refill  . Blood Glucose Monitoring Suppl (ONE TOUCH ULTRA 2) W/DEVICE KIT Text up to bid  1 each  0  . Cholecalciferol (VITAMIN D3) 1000 UNITS CAPS Take 2 capsules by mouth daily.        . fish oil-omega-3 fatty acids 1000 MG  capsule Take 1 capsule by mouth 2 (two) times daily.        . fluticasone (FLONASE) 50 MCG/ACT nasal spray Place 2 sprays into the nose daily.  16 g  6  . glucose blood (ONE TOUCH ULTRA TEST) test strip Test up to bid  100 each  12  . glucose blood (ONETOUCH VERIO) test strip Use as instructed  50 each  12  . metFORMIN (GLUCOPHAGE) 500 MG tablet Take 1 tablet (500 mg total) by mouth 2 (two) times daily with a meal.  180 tablet  3  . ONE TOUCH LANCETS MISC Use qd prn  50 each  11  . vitamin B-12 (CYANOCOBALAMIN) 1000 MCG tablet Take 1,000 mcg by mouth daily.        . benazepril (LOTENSIN) 10 MG tablet TAKE 2 TABLETS BY MOUTH EVERY DAY AT BEDTIME  180 tablet  2   No current facility-administered medications for this visit.    Review of Systems Review of Systems  Constitutional: Negative for fever, chills and unexpected weight change.  HENT: Negative for congestion, hearing loss, sore throat, trouble swallowing and voice change.   Eyes: Negative for visual disturbance.  Respiratory: Negative for cough and wheezing.   Cardiovascular:  Negative for chest pain, palpitations and leg swelling.  Gastrointestinal: Negative for nausea, vomiting, abdominal pain, diarrhea, constipation, blood in stool, abdominal distention and anal bleeding.       Please see history of present illness  Genitourinary: Negative for hematuria, vaginal bleeding and difficulty urinating.  Musculoskeletal: Negative for arthralgias.  Skin: Negative for rash and wound.  Neurological: Negative for seizures, syncope and headaches.  Hematological: Negative for adenopathy. Does not bruise/bleed easily.  Psychiatric/Behavioral: Negative for confusion.    Blood pressure 140/90, pulse 78, temperature 97.2 F (36.2 C), temperature source Temporal, resp. rate 16, height _0  (1.499 m), weight 237 lb 12.8 oz (107.865 kg).  Physical Exam Physical Exam  Constitutional: She is oriented to person, place, and time. She appears  well-developed and well-nourished.  HENT:  Head: Normocephalic and atraumatic.  Eyes: EOM are normal. Pupils are equal, round, and reactive to light. Right eye exhibits no discharge. Left eye exhibits no discharge.  Neck: Normal range of motion. Neck supple. No tracheal deviation present.  Cardiovascular: Normal rate and normal heart sounds.   No murmur heard. Pulmonary/Chest: Effort normal and breath sounds normal. No stridor.  Abdominal: Soft. She exhibits no distension. There is no tenderness. There is no rebound and no guarding.    Incisional hernia above the umbilicus and to the left, spontaneously reduces, protrudes on abdominal contraction, no tenderness, fascial defect feels to be about 3 cm  Musculoskeletal: Normal range of motion. She exhibits no tenderness.  Neurological: She is alert and oriented to person, place, and time.  Skin: Skin is warm and dry.  Psychiatric: She has a normal mood and affect.    Assessment    Symptomatic recurrent incisional hernia    Plan    Again I have offered laparoscopic repair of Recurrent incisional hernia with mesh. Procedure, risks, and benefits were discussed in detail with the patient. She is agreeable and wants to schedule over the summer.       Zenovia Jarred 09/24/2013, 10:18 AM

## 2013-11-21 ENCOUNTER — Ambulatory Visit (INDEPENDENT_AMBULATORY_CARE_PROVIDER_SITE_OTHER): Payer: BC Managed Care – PPO | Admitting: Pulmonary Disease

## 2013-11-21 ENCOUNTER — Encounter: Payer: Self-pay | Admitting: Pulmonary Disease

## 2013-11-21 VITALS — BP 122/62 | HR 70 | Temp 97.8°F | Ht 61.0 in | Wt 237.0 lb

## 2013-11-21 DIAGNOSIS — G4733 Obstructive sleep apnea (adult) (pediatric): Secondary | ICD-10-CM

## 2013-11-21 NOTE — Progress Notes (Signed)
   Subjective:    Patient ID: Sierra Johnson, female    DOB: 1951/05/29, 63 y.o.   MRN: [id number]  HPI The patient comes in today for followup of her obstructive sleep apnea. I have not seen her since last year, and we were trying to optimize her pressure at the last visit. I never received her downloaded from the home care company, but the patient is telling me that she is wearing the device compliantly. She is having issues with her nasal pillows, and would like to try a different type of mask. She is also having issues with sleep maintenance insomnia, where she will awaken and take hours to get back to sleep. She has no issues getting to sleep. Of note, the patient's weight is increased since the last visit.   Review of Systems  Constitutional: Negative for fever and unexpected weight change.  HENT: Negative for congestion, dental problem, ear pain, nosebleeds, postnasal drip, rhinorrhea, sinus pressure, sneezing, sore throat and trouble swallowing.   Eyes: Negative for redness and itching.  Respiratory: Negative for cough, chest tightness, shortness of breath and wheezing.   Cardiovascular: Negative for palpitations and leg swelling.  Gastrointestinal: Negative for nausea and vomiting.  Genitourinary: Negative for dysuria.  Musculoskeletal: Negative for joint swelling.  Skin: Negative for rash.  Neurological: Negative for headaches.  Hematological: Does not bruise/bleed easily.  Psychiatric/Behavioral: Negative for dysphoric mood. The patient is not nervous/anxious.        Objective:   Physical Exam Obese female in no acute distress Nose without purulence or discharge noted No skin breakdown or pressure necrosis from the CPAP mask Neck without lymphadenopathy or thyromegaly Lower extremities with mild edema, no cyanosis Alert and oriented, does not appear to be sleepy, moves all 4 extremities.       Assessment & Plan:

## 2013-11-21 NOTE — Assessment & Plan Note (Signed)
The patient is wearing CPAP compliantly by her history, but does not feel that she is sleeping well with the device. We have never received her downloaded for pressure optimization, and we'll get a download off her device for the last 3 months to trouble shoot. She is also having issues with her nasal pillows, and we'll therefore change her to a nasal bubble mask to see if this is better. I've also had a long discussion with her about stimulus control therapy and how it can help her sleep maintenance insomnia. Finally, I have encouraged her to work aggressively on weight loss.

## 2013-11-21 NOTE — Patient Instructions (Signed)
Continue on cpap, and will get a download off your machine to make sure everything is functioning properly at right pressure. Would try nasal mask rather than pillows to see if this works better. Work on weight loss Do not stay in bed if you cannot get back to sleep within 66min.  Make sure you are not napping or dozing during waking hours. followup with me again in one year if doing well, but let us know if you continue to have issues with your cpap

## 2013-12-09 ENCOUNTER — Encounter: Payer: Self-pay | Admitting: Pulmonary Disease

## 2013-12-09 ENCOUNTER — Telehealth: Payer: Self-pay | Admitting: Pulmonary Disease

## 2013-12-09 NOTE — Telephone Encounter (Signed)
[email or web address].Jennifer Castillo, CMA  

## 2013-12-09 NOTE — Telephone Encounter (Signed)
I spoke with patient about results and she verbalized understanding and had no questions 

## 2013-12-09 NOTE — Telephone Encounter (Signed)
Please let pt know that her download shows her machine is set on 12, and is controlling her sleep apnea very well.  No changes.

## 2013-12-16 ENCOUNTER — Telehealth: Payer: Self-pay | Admitting: *Deleted

## 2013-12-16 DIAGNOSIS — E119 Type 2 diabetes mellitus without complications: Secondary | ICD-10-CM

## 2013-12-16 NOTE — Telephone Encounter (Signed)
Patient is coming in for a lipid panel. FYI.

## 2014-01-28 ENCOUNTER — Telehealth: Payer: Self-pay

## 2014-01-28 NOTE — Telephone Encounter (Signed)
LVM for pt to call back and schedule CPE with PCP.

## 2014-03-25 ENCOUNTER — Telehealth: Payer: Self-pay | Admitting: Pulmonary Disease

## 2014-03-25 NOTE — Telephone Encounter (Signed)
LMTCB x 1 

## 2014-03-26 NOTE — Telephone Encounter (Signed)
Pt returned call [phone]

## 2014-03-26 NOTE — Telephone Encounter (Signed)
Spoke with patient-states she was told by her insurance company they need more information regarding her supplies [facility] charged her for. Pt is aware that I will get with [facility] first to see if they have any information about this. Staff message to [facility] with [place].

## 2014-03-27 NOTE — Telephone Encounter (Signed)
Sierra Johnson have you heard back from [facility] about this pt?  thanks

## 2014-03-27 NOTE — Telephone Encounter (Signed)
I have not. Thanks.

## 2014-03-27 NOTE — Telephone Encounter (Signed)
Called and spoke to Hammond with [facility]. Ron stated there is nothing that he can see that is needed. Ron advised the [employer] company needs to be contacted to communicate directly with [facility] to see what is needed. Called and spoke to pt. I advised pt to call insurance company to see what is needed. Advised pt that we have also messaged Melissa to investigate the issue. Pt aware we will contact her when we hear back from [facility].

## 2014-04-01 NOTE — Telephone Encounter (Signed)
I called Sierra Johnson at [facility] is aware that pt called stating insurance needed more information about supplies. Sierra Johnson with [facility] is looking into this matter and will contact us back by staff message or phone call.

## 2014-04-01 NOTE — Telephone Encounter (Signed)
Melissa returned [phone]

## 2014-04-01 NOTE — Telephone Encounter (Signed)
[place] x 1 with Melissa with [facility] to f/u on this pt.

## 2014-04-03 NOTE — Telephone Encounter (Signed)
Spoke with the [facility]  She states that there is nothing needed that she can see  She will call if there is  Will close encounter

## 2014-04-30 ENCOUNTER — Ambulatory Visit (INDEPENDENT_AMBULATORY_CARE_PROVIDER_SITE_OTHER): Payer: BC Managed Care – PPO | Admitting: Internal Medicine

## 2014-04-30 ENCOUNTER — Other Ambulatory Visit (INDEPENDENT_AMBULATORY_CARE_PROVIDER_SITE_OTHER): Payer: BC Managed Care – PPO

## 2014-04-30 ENCOUNTER — Encounter: Payer: Self-pay | Admitting: Internal Medicine

## 2014-04-30 VITALS — BP 120/80 | HR 70 | Temp 98.2°F | Ht 61.0 in | Wt 238.0 lb

## 2014-04-30 DIAGNOSIS — K432 Incisional hernia without obstruction or gangrene: Secondary | ICD-10-CM

## 2014-04-30 DIAGNOSIS — E1169 Type 2 diabetes mellitus with other specified complication: Secondary | ICD-10-CM

## 2014-04-30 DIAGNOSIS — M1710 Unilateral primary osteoarthritis, unspecified knee: Secondary | ICD-10-CM | POA: Insufficient documentation

## 2014-04-30 DIAGNOSIS — E538 Deficiency of other specified B group vitamins: Secondary | ICD-10-CM

## 2014-04-30 DIAGNOSIS — E669 Obesity, unspecified: Secondary | ICD-10-CM

## 2014-04-30 DIAGNOSIS — D6489 Other specified anemias: Secondary | ICD-10-CM

## 2014-04-30 DIAGNOSIS — Z0000 Encounter for general adult medical examination without abnormal findings: Secondary | ICD-10-CM

## 2014-04-30 DIAGNOSIS — M1712 Unilateral primary osteoarthritis, left knee: Secondary | ICD-10-CM | POA: Insufficient documentation

## 2014-04-30 DIAGNOSIS — E119 Type 2 diabetes mellitus without complications: Secondary | ICD-10-CM

## 2014-04-30 DIAGNOSIS — G4733 Obstructive sleep apnea (adult) (pediatric): Secondary | ICD-10-CM

## 2014-04-30 DIAGNOSIS — E785 Hyperlipidemia, unspecified: Secondary | ICD-10-CM

## 2014-04-30 DIAGNOSIS — M170 Bilateral primary osteoarthritis of knee: Secondary | ICD-10-CM | POA: Diagnosis not present

## 2014-04-30 DIAGNOSIS — M179 Osteoarthritis of knee, unspecified: Secondary | ICD-10-CM | POA: Insufficient documentation

## 2014-04-30 LAB — HEPATIC FUNCTION PANEL
ALT: 22 U/L (ref 0–35)
AST: 19 U/L (ref 0–37)
Albumin: 3.4 g/dL — ABNORMAL LOW (ref 3.5–5.2)
Alkaline Phosphatase: 73 U/L (ref 39–117)
BILIRUBIN TOTAL: 0.5 mg/dL (ref 0.2–1.2)
Bilirubin, Direct: 0.1 mg/dL (ref 0.0–0.3)
Total Protein: 7.8 g/dL (ref 6.0–8.3)

## 2014-04-30 LAB — HEMOGLOBIN A1C: Hgb A1c MFr Bld: 7.1 % — ABNORMAL HIGH (ref 4.6–6.5)

## 2014-04-30 LAB — CBC WITH DIFFERENTIAL/PLATELET
BASOS PCT: 0.4 % (ref 0.0–3.0)
Basophils Absolute: 0 10*3/uL (ref 0.0–0.1)
EOS PCT: 1.9 % (ref 0.0–5.0)
Eosinophils Absolute: 0.2 10*3/uL (ref 0.0–0.7)
HEMATOCRIT: 38.2 % (ref 36.0–46.0)
Hemoglobin: 12.3 g/dL (ref 12.0–15.0)
LYMPHS ABS: 3.1 10*3/uL (ref 0.7–4.0)
Lymphocytes Relative: 31.9 % (ref 12.0–46.0)
MCHC: 32.3 g/dL (ref 30.0–36.0)
MCV: 85.2 fl (ref 78.0–100.0)
MONO ABS: 0.7 10*3/uL (ref 0.1–1.0)
Monocytes Relative: 7.5 % (ref 3.0–12.0)
Neutro Abs: 5.7 10*3/uL (ref 1.4–7.7)
Neutrophils Relative %: 58.3 % (ref 43.0–77.0)
Platelets: 338 10*3/uL (ref 150.0–400.0)
RBC: 4.48 Mil/uL (ref 3.87–5.11)
RDW: 14.8 % (ref 11.5–15.5)
WBC: 9.8 10*3/uL (ref 4.0–10.5)

## 2014-04-30 LAB — BASIC METABOLIC PANEL
BUN: 8 mg/dL (ref 6–23)
CALCIUM: 9.5 mg/dL (ref 8.4–10.5)
CO2: 24 meq/L (ref 19–32)
Chloride: 101 mEq/L (ref 96–112)
Creatinine, Ser: 0.8 mg/dL (ref 0.4–1.2)
GFR: 92.47 mL/min (ref >60.00)
GLUCOSE: 106 mg/dL — AB (ref 70–99)
Potassium: 4.5 mEq/L (ref 3.5–5.1)
Sodium: 136 mEq/L (ref 135–145)

## 2014-04-30 LAB — LIPID PANEL
CHOL/HDL RATIO: 4
Cholesterol: 202 mg/dL — ABNORMAL HIGH (ref 0–200)
HDL: 56.9 mg/dL (ref >39.00)
LDL Cholesterol: 131 mg/dL — ABNORMAL HIGH (ref 0–99)
NONHDL: 145.1
Triglycerides: 70 mg/dL (ref 0.0–149.0)
VLDL: 14 mg/dL (ref 0.0–40.0)

## 2014-04-30 LAB — VITAMIN B12: Vitamin B-12: 840 pg/mL (ref 211–911)

## 2014-04-30 LAB — TSH: TSH: 1.39 u[IU]/mL (ref 0.35–4.50)

## 2014-04-30 MED ORDER — BENAZEPRIL HCL 10 MG PO TABS: 10.0000 mg | ORAL_TABLET | Freq: Every day | ORAL | Status: DC

## 2014-04-30 MED ORDER — METFORMIN HCL 500 MG PO TABS: 500.0000 mg | ORAL_TABLET | Freq: Two times a day (BID) | ORAL | Status: DC

## 2014-04-30 NOTE — Assessment & Plan Note (Addendum)
We discussed age appropriate health related issues, including available/recomended screening tests and vaccinations. We discussed a need for adhering to healthy di et and exercise. Labs/EKG were reviewed/ordered. All questions were answered. Pt declined shots

## 2014-04-30 NOTE — Assessment & Plan Note (Addendum)
Low carb diet. She is a vegeterian since 2013 Lap Band option discussed Low carb diet

## 2014-04-30 NOTE — Assessment & Plan Note (Signed)
Labs B12 po 

## 2014-04-30 NOTE — Assessment & Plan Note (Signed)
CBC, iron 

## 2014-04-30 NOTE — Progress Notes (Signed)
   Subjective:   HPI  The patient is here for a wellness exam. The patient has been doing well overall without major physical or psychological issues going on lately.  C/o wt gain, obesity  F/u DM  Review of Systems  Constitutional: Negative for chills, activity change, appetite change, fatigue and unexpected weight change.  HENT: Negative for congestion, mouth sores and sinus pressure.   Eyes: Negative for visual disturbance.  Respiratory: Negative for cough and chest tightness.   Gastrointestinal: Negative for nausea and abdominal pain.  Genitourinary: Negative for frequency, difficulty urinating and vaginal pain.  Musculoskeletal: Negative for back pain and gait problem.  Skin: Negative for pallor and rash.  Neurological: Negative for dizziness, tremors, weakness, numbness and headaches.  Psychiatric/Behavioral: Negative for confusion and sleep disturbance.   Wt Readings from Last 3 Encounters:  04/30/14 238 lb (107.956 kg)  11/21/13 237 lb (107.502 kg)  09/24/13 237 lb 12.8 oz (107.865 kg)   BP Readings from Last 3 Encounters:  04/30/14 120/80  11/21/13 122/62  09/24/13 140/90        Objective:   Physical Exam  Constitutional: She appears well-developed. No distress.  HENT:  Head: Normocephalic.  Right Ear: External ear normal.  Left Ear: External ear normal.  Nose: Nose normal.  Mouth/Throat: Oropharynx is clear and moist.  Eyes: Conjunctivae are normal. Pupils are equal, round, and reactive to light. Right eye exhibits no discharge. Left eye exhibits no discharge.  Neck: Normal range of motion. Neck supple. No JVD present. No tracheal deviation present. No thyromegaly present.  Cardiovascular: Normal rate, regular rhythm and normal heart sounds.   Pulmonary/Chest: No stridor. No respiratory distress. She has no wheezes.  Abdominal: Soft. Bowel sounds are normal. She exhibits no distension and no mass. There is no tenderness. There is no rebound and no guarding.   Musculoskeletal: She exhibits no edema or tenderness.  Lymphadenopathy:    She has no cervical adenopathy.  Neurological: She displays normal reflexes. No cranial nerve deficit. She exhibits normal muscle tone. Coordination normal.  Skin: No rash noted. No erythema.  Psychiatric: She has a normal mood and affect. Her behavior is normal. Judgment and thought content normal.  Obese    Lab Results  Component Value Date   WBC 10.6* 04/25/2013   HGB 12.6 04/25/2013   HCT 37.7 04/25/2013   PLT 300.0 04/25/2013   GLUCOSE 97 04/25/2013   CHOL 202* 08/27/2012   TRIG 80.0 08/27/2012   HDL 62.50 08/27/2012   LDLDIRECT 119.5 08/27/2012   LDLCALC 125* 03/15/2010   ALT 19 04/25/2013   AST 19 04/25/2013   NA 138 04/25/2013   K 4.3 04/25/2013   CL 103 04/25/2013   CREATININE 0.9 04/25/2013   BUN 15 04/25/2013   CO2 28 04/25/2013   TSH 1.26 04/25/2013   HGBA1C 6.8* 04/25/2013   MICROALBUR 16.1* 04/25/2013       Assessment & Plan:

## 2014-04-30 NOTE — Assessment & Plan Note (Signed)
Labs

## 2014-04-30 NOTE — Assessment & Plan Note (Signed)
2015 midline Dr Marcello [facility] Surgery is pending

## 2014-04-30 NOTE — Assessment & Plan Note (Signed)
A1c Continue with current prescription therapy as reflected on the Med list.

## 2014-04-30 NOTE — Assessment & Plan Note (Signed)
Chronic R>L R TKR may be needed  Wt loss

## 2014-04-30 NOTE — Progress Notes (Signed)
Pre visit review using our clinic review tool, if applicable. No additional management support is needed unless otherwise documented below in the visit note. 

## 2014-04-30 NOTE — Assessment & Plan Note (Signed)
On CPAP. ?

## 2014-05-26 ENCOUNTER — Ambulatory Visit (INDEPENDENT_AMBULATORY_CARE_PROVIDER_SITE_OTHER): Payer: Self-pay | Admitting: General Surgery

## 2014-05-27 ENCOUNTER — Encounter (HOSPITAL_COMMUNITY)
Admission: RE | Admit: 2014-05-27 | Discharge: 2014-05-27 | Disposition: A | Discharge status: Home / Self Care | Payer: BC Managed Care – PPO | Source: Ambulatory Visit | Attending: General Surgery | Admitting: General Surgery

## 2014-05-27 ENCOUNTER — Encounter (HOSPITAL_COMMUNITY): Payer: Self-pay

## 2014-05-27 ENCOUNTER — Encounter (HOSPITAL_COMMUNITY)
Admission: RE | Admit: 2014-05-27 | Discharge: 2014-05-27 | Disposition: A | Discharge status: Home / Self Care | Payer: BC Managed Care – PPO | Source: Ambulatory Visit | Attending: Anesthesiology | Admitting: Anesthesiology

## 2014-05-27 DIAGNOSIS — Z01810 Encounter for preprocedural cardiovascular examination: Secondary | ICD-10-CM | POA: Diagnosis not present

## 2014-05-27 DIAGNOSIS — I10 Essential (primary) hypertension: Secondary | ICD-10-CM | POA: Insufficient documentation

## 2014-05-27 DIAGNOSIS — D649 Anemia, unspecified: Secondary | ICD-10-CM | POA: Diagnosis not present

## 2014-05-27 DIAGNOSIS — Z881 Allergy status to other antibiotic agents status: Secondary | ICD-10-CM | POA: Diagnosis not present

## 2014-05-27 DIAGNOSIS — R0602 Shortness of breath: Secondary | ICD-10-CM | POA: Diagnosis not present

## 2014-05-27 DIAGNOSIS — K432 Incisional hernia without obstruction or gangrene: Secondary | ICD-10-CM | POA: Diagnosis not present

## 2014-05-27 DIAGNOSIS — G4730 Sleep apnea, unspecified: Secondary | ICD-10-CM | POA: Diagnosis not present

## 2014-05-27 DIAGNOSIS — E119 Type 2 diabetes mellitus without complications: Secondary | ICD-10-CM | POA: Diagnosis not present

## 2014-05-27 DIAGNOSIS — M1990 Unspecified osteoarthritis, unspecified site: Secondary | ICD-10-CM | POA: Diagnosis not present

## 2014-05-27 LAB — BASIC METABOLIC PANEL
ANION GAP: 13 (ref 5–15)
BUN: 14 mg/dL (ref 6–23)
CO2: 22 mEq/L (ref 19–32)
Calcium: 9.7 mg/dL (ref 8.4–10.5)
Chloride: 100 mEq/L (ref 96–112)
Creatinine, Ser: 0.88 mg/dL (ref 0.50–1.10)
GFR calc Af Amer: 81 mL/min — ABNORMAL LOW (ref >90)
GFR, EST NON AFRICAN AMERICAN: 69 mL/min — AB (ref >90)
GLUCOSE: 117 mg/dL — AB (ref 70–99)
Potassium: 4.5 mEq/L (ref 3.7–5.3)
SODIUM: 135 meq/L — AB (ref 137–147)

## 2014-05-27 LAB — CBC
HCT: 38.5 % (ref 36.0–46.0)
Hemoglobin: 12.8 g/dL (ref 12.0–15.0)
MCH: 29 pg (ref 26.0–34.0)
MCHC: 33.2 g/dL (ref 30.0–36.0)
MCV: 87.1 fL (ref 78.0–100.0)
Platelets: 301 10*3/uL (ref 150–400)
RBC: 4.42 MIL/uL (ref 3.87–5.11)
RDW: 14.4 % (ref 11.5–15.5)
WBC: 8.7 10*3/uL (ref 4.0–10.5)

## 2014-05-27 MED ORDER — CHLORHEXIDINE GLUCONATE 4 % EX LIQD: 1.0000 "application " | Freq: Once | CUTANEOUS | Status: DC

## 2014-05-27 NOTE — Pre-Procedure Instructions (Signed)
Sierra Johnson  05/27/2014   Your procedure is scheduled on:  June 03, 2014  Report to [facility] Admitting at 11 AM.  Call this number if you have problems the morning of surgery: [phone]   Remember:   Do not eat food or drink liquids after midnight.   Take these medicines the morning of surgery with A SIP OF WATER: NONE   STOP FISH OIL AS OF TODAY   Do not wear jewelry, make-up or nail polish.  Do not wear lotions, powders, or perfumes. You may wear deodorant.  Do not shave 48 hours prior to surgery. Men may shave face and neck.  Do not bring valuables to the hospital.  [facility] is not responsible                  for any belongings or valuables.               Contacts, dentures or bridgework may not be worn into surgery.  Leave suitcase in the car. After surgery it may be brought to your room.  For patients admitted to the hospital, discharge time is determined by your                treatment team.               Patients discharged the day of surgery will not be allowed to drive  home.  Name and phone number of your driver: FAMILY/FRIEND     Please read over the following fact sheets that you were given: Pain Booklet, Coughing and Deep Breathing and Surgical Site Infection Prevention

## 2014-06-02 MED ORDER — CEFAZOLIN SODIUM-DEXTROSE 2-3 GM-% IV SOLR
2.0000 g | INTRAVENOUS | Status: AC
  Administered 2014-06-03: 2 g via INTRAVENOUS
  Filled 2014-06-02: qty 50

## 2014-06-02 NOTE — H&P (Signed)
History of Present Illness Sierra Neri E. Grandville Silos MD; 03/24/2014 9:13 AM) Patient words: eval hernia.  The patient is a 63 year old female who presents with an incisional hernia. Sierra Johnson is well known to me status post evaluation for recurrent incisional hernia. I saw her last in April. She has not had any pain from her hernia. No change in bowel habits. No significant nausea. She would like to have it repaired before the end of the year. We previously discussed laparoscopic approach with mesh.   Other Problems Sierra Johnson, CMA; 03/24/2014 8:59 AM) Diabetes Mellitus High blood pressure  Past Surgical History Sierra Johnson, CMA; 03/24/2014 8:59 AM) Laparoscopic Inguinal Hernia Surgery Left.  Diagnostic Studies History Sierra Johnson, CMA; 03/24/2014 8:59 AM) Colonoscopy 5-10 years ago Mammogram 1-3 years ago  Allergies Sierra Johnson, CMA; 03/24/2014 8:56 AM) Tetracycline *CHEMICALS*  Medication History (Sierra Johnson, CMA; 03/24/2014 8:58 AM) Benazepril HCl (10MG  Tablet, Oral daily) Active. Vitamin D3 (10000UNIT Capsule, Oral daily) Active. Fish Oil (1000MG  Capsule DR, Oral daily) Active. Flonase Allergy Relief (50MCG/ACT Suspension, Nasal as needed) Active. MetFORMIN HCl (500MG  Tablet, Oral daily) Active. Vitamin B-12 (1000MCG Tablet, Oral daily) Active.  Social History (Sierra Johnson; 03/24/2014 8:59 AM) Caffeine use Coffee. No alcohol use No drug use Tobacco use Never smoker.  Family History Sierra Johnson, CMA; 03/24/2014 8:59 AM) Respiratory Condition Father.  Pregnancy / Birth History Sierra Johnson, [place]; 03/24/2014 8:59 AM) Age at menarche 32 years. Age of menopause 68-50 Gravida 2 Maternal age 71-35 Para 2  Review of Systems ([place]; 03/24/2014 8:59 AM) General Not Present- Appetite Loss, Chills, Fatigue, Fever, Night Sweats, Weight Gain and Weight Loss. Skin Not Present- Change in Wart/Mole, Dryness, Hives, Jaundice, New Lesions, Non-Healing Wounds,  Rash and Ulcer. HEENT Present- Wears glasses/contact lenses. Not Present- Earache, Hearing Loss, Hoarseness, Nose Bleed, Oral Ulcers, Ringing in the Ears, Seasonal Allergies, Sinus Pain, Sore Throat, Visual Disturbances and Yellow Eyes. Respiratory Not Present- Bloody sputum, Chronic Cough, Difficulty Breathing, Snoring and Wheezing. Breast Not Present- Breast Mass, Breast Pain, Nipple Discharge and Skin Changes. Cardiovascular Not Present- Chest Pain, Difficulty Breathing Lying Down, Leg Cramps, Palpitations, Rapid Heart Rate, Shortness of Breath and Swelling of Extremities. Gastrointestinal Not Present- Abdominal Pain, Bloating, Bloody Stool, Change in Bowel Habits, Chronic diarrhea, Constipation, Difficulty Swallowing, Excessive gas, Gets full quickly at meals, Hemorrhoids, Indigestion, Nausea, Rectal Pain and Vomiting. Female Genitourinary Not Present- Frequency, Nocturia, Painful Urination, Pelvic Pain and Urgency. Musculoskeletal Not Present- Back Pain, Joint Pain, Joint Stiffness, Muscle Pain, Muscle Weakness and Swelling of Extremities. Neurological Not Present- Decreased Memory, Fainting, Headaches, Numbness, Seizures, Tingling, Tremor, Trouble walking and Weakness. Psychiatric Not Present- Anxiety, Bipolar, Change in Sleep Pattern, Depression, Fearful and Frequent crying. Endocrine Not Present- Cold Intolerance, Excessive Hunger, Hair Changes, Heat Intolerance, Hot flashes and New Diabetes. Hematology Not Present- Easy Bruising, Excessive bleeding, Gland problems, HIV and Persistent Infections.   Vitals (Sierra Johnson CMA; 03/24/2014 8:58 AM) 03/24/2014 8:58 AM Weight: 234 lb Height: 60in Body Surface Area: 2.12 m Body Mass Index: 45.7 kg/m Temp.: 97.26F(Temporal)  BP: 124/72 (Sitting, Left Arm, Standard)    Physical Exam Sierra Neri E. Grandville Silos MD; 03/24/2014 9:15 AM) General Note: no distress   Eye Note: pupils equal and reactive   ENMT Note: oral mucosa  moist   Chest and Lung Exam Note: could auscultation bilaterally   Cardiovascular Note: regular rate and rhythm, no murmurs   Abdomen Note: soft, incisional hernia above and to left of the umbilicus, reduces easily, defect  feels to be 4 cm   Peripheral Vascular Note: minimal edema   Neurologic Note: alert and oriented   Musculoskeletal Note: moves extremities well without deformity     Assessment & Plan Sierra Neri E. Grandville Silos MD; 03/24/2014 9:17 AM) Sierra Johnson HERNIA, WITHOUT OBSTRUCTION OR GANGRENE (553.21  K43.2) Impression: I have offered laparoscopic repair with mesh.Procedure, risks, and benefits were discussed. She needs to schedule in December because of her work assignments. She is agreeable. She will call in the meantime if any symptoms arise.   Sierra Skeans, MD, MPH, FACS Trauma: [phone] General Surgery: [phone]

## 2014-06-03 ENCOUNTER — Encounter (HOSPITAL_COMMUNITY): Payer: Self-pay | Admitting: *Deleted

## 2014-06-03 ENCOUNTER — Encounter (HOSPITAL_COMMUNITY)
Admission: RE | Disposition: A | Discharge status: Home / Self Care | Payer: Self-pay | Source: Ambulatory Visit | Attending: General Surgery

## 2014-06-03 ENCOUNTER — Ambulatory Visit (HOSPITAL_COMMUNITY): Payer: BC Managed Care – PPO | Admitting: Certified Registered"

## 2014-06-03 ENCOUNTER — Observation Stay (HOSPITAL_COMMUNITY)
Admission: RE | Admit: 2014-06-03 | Discharge: 2014-06-06 | Disposition: A | Discharge status: Home / Self Care | Payer: BC Managed Care – PPO | Source: Ambulatory Visit | Attending: General Surgery | Admitting: General Surgery

## 2014-06-03 DIAGNOSIS — K432 Incisional hernia without obstruction or gangrene: Secondary | ICD-10-CM | POA: Diagnosis not present

## 2014-06-03 DIAGNOSIS — M1990 Unspecified osteoarthritis, unspecified site: Secondary | ICD-10-CM | POA: Insufficient documentation

## 2014-06-03 DIAGNOSIS — Z9889 Other specified postprocedural states: Secondary | ICD-10-CM

## 2014-06-03 DIAGNOSIS — G4730 Sleep apnea, unspecified: Secondary | ICD-10-CM | POA: Insufficient documentation

## 2014-06-03 DIAGNOSIS — Z881 Allergy status to other antibiotic agents status: Secondary | ICD-10-CM | POA: Insufficient documentation

## 2014-06-03 DIAGNOSIS — D649 Anemia, unspecified: Secondary | ICD-10-CM | POA: Insufficient documentation

## 2014-06-03 DIAGNOSIS — I10 Essential (primary) hypertension: Secondary | ICD-10-CM

## 2014-06-03 DIAGNOSIS — R0602 Shortness of breath: Secondary | ICD-10-CM | POA: Insufficient documentation

## 2014-06-03 DIAGNOSIS — E119 Type 2 diabetes mellitus without complications: Secondary | ICD-10-CM | POA: Insufficient documentation

## 2014-06-03 DIAGNOSIS — Z8719 Personal history of other diseases of the digestive system: Secondary | ICD-10-CM

## 2014-06-03 HISTORY — PX: INCISIONAL HERNIA REPAIR: SHX193

## 2014-06-03 HISTORY — PX: INSERTION OF MESH: SHX5868

## 2014-06-03 LAB — CBC
HCT: 38.8 % (ref 36.0–46.0)
HEMOGLOBIN: 12.6 g/dL (ref 12.0–15.0)
MCH: 28.4 pg (ref 26.0–34.0)
MCHC: 32.5 g/dL (ref 30.0–36.0)
MCV: 87.4 fL (ref 78.0–100.0)
Platelets: 280 10*3/uL (ref 150–400)
RBC: 4.44 MIL/uL (ref 3.87–5.11)
RDW: 14.4 % (ref 11.5–15.5)
WBC: 14 10*3/uL — ABNORMAL HIGH (ref 4.0–10.5)

## 2014-06-03 LAB — GLUCOSE, CAPILLARY
Glucose-Capillary: 128 mg/dL — ABNORMAL HIGH (ref 70–99)
Glucose-Capillary: 151 mg/dL — ABNORMAL HIGH (ref 70–99)
Glucose-Capillary: 247 mg/dL — ABNORMAL HIGH (ref 70–99)

## 2014-06-03 LAB — CREATININE, SERUM
Creatinine, Ser: 0.93 mg/dL (ref 0.50–1.10)
GFR, EST AFRICAN AMERICAN: 75 mL/min — AB (ref >90)
GFR, EST NON AFRICAN AMERICAN: 65 mL/min — AB (ref >90)

## 2014-06-03 SURGERY — REPAIR, HERNIA, INCISIONAL, LAPAROSCOPIC
Anesthesia: General | Site: Abdomen

## 2014-06-03 MED ORDER — HYDROMORPHONE HCL 1 MG/ML IJ SOLN: 0.2500 mg | INTRAMUSCULAR | Status: DC | PRN

## 2014-06-03 MED ORDER — MIDAZOLAM HCL 2 MG/2ML IJ SOLN
INTRAMUSCULAR | Status: AC
  Filled 2014-06-03: qty 2

## 2014-06-03 MED ORDER — LACTATED RINGERS IV SOLN
INTRAVENOUS | Status: DC
  Administered 2014-06-03: 12:00:00 via INTRAVENOUS

## 2014-06-03 MED ORDER — SODIUM CHLORIDE 0.9 % IR SOLN
Status: DC | PRN
  Administered 2014-06-03: 1000 mL

## 2014-06-03 MED ORDER — PROPOFOL 10 MG/ML IV BOLUS
INTRAVENOUS | Status: DC | PRN
  Administered 2014-06-03: 160 mg via INTRAVENOUS

## 2014-06-03 MED ORDER — MORPHINE SULFATE 2 MG/ML IJ SOLN
2.0000 mg | INTRAMUSCULAR | Status: DC | PRN
  Administered 2014-06-03 – 2014-06-04 (×4): 4 mg via INTRAVENOUS
  Administered 2014-06-04: 2 mg via INTRAVENOUS
  Filled 2014-06-03 (×2): qty 2
  Filled 2014-06-03: qty 1
  Filled 2014-06-03 (×4): qty 2

## 2014-06-03 MED ORDER — DEXAMETHASONE SODIUM PHOSPHATE 4 MG/ML IJ SOLN
INTRAMUSCULAR | Status: AC
  Filled 2014-06-03: qty 1

## 2014-06-03 MED ORDER — ONDANSETRON HCL 4 MG/2ML IJ SOLN: 4.0000 mg | Freq: Once | INTRAMUSCULAR | Status: DC | PRN

## 2014-06-03 MED ORDER — ROCURONIUM BROMIDE 100 MG/10ML IV SOLN
INTRAVENOUS | Status: DC | PRN
  Administered 2014-06-03: 25 mg via INTRAVENOUS

## 2014-06-03 MED ORDER — GLYCOPYRROLATE 0.2 MG/ML IJ SOLN
INTRAMUSCULAR | Status: DC | PRN
  Administered 2014-06-03: 0.6 mg via INTRAVENOUS

## 2014-06-03 MED ORDER — NEOSTIGMINE METHYLSULFATE 10 MG/10ML IV SOLN
INTRAVENOUS | Status: DC | PRN
  Administered 2014-06-03: 4 mg via INTRAVENOUS

## 2014-06-03 MED ORDER — HYDROMORPHONE HCL 1 MG/ML IJ SOLN
INTRAMUSCULAR | Status: AC
  Administered 2014-06-03: 0.5 mg via INTRAVENOUS
  Filled 2014-06-03: qty 1

## 2014-06-03 MED ORDER — FENTANYL CITRATE 0.05 MG/ML IJ SOLN
INTRAMUSCULAR | Status: DC | PRN
  Administered 2014-06-03: 100 ug via INTRAVENOUS
  Administered 2014-06-03: 25 ug via INTRAVENOUS

## 2014-06-03 MED ORDER — PROPOFOL 10 MG/ML IV BOLUS
INTRAVENOUS | Status: AC
  Filled 2014-06-03: qty 20

## 2014-06-03 MED ORDER — ONDANSETRON HCL 4 MG/2ML IJ SOLN
INTRAMUSCULAR | Status: DC | PRN
  Administered 2014-06-03: 4 mg via INTRAVENOUS

## 2014-06-03 MED ORDER — NEOSTIGMINE METHYLSULFATE 10 MG/10ML IV SOLN
INTRAVENOUS | Status: AC
  Filled 2014-06-03: qty 1

## 2014-06-03 MED ORDER — BUPIVACAINE-EPINEPHRINE (PF) 0.5% -1:200000 IJ SOLN
INTRAMUSCULAR | Status: AC
  Filled 2014-06-03: qty 30

## 2014-06-03 MED ORDER — INSULIN ASPART 100 UNIT/ML ~~LOC~~ SOLN
0.0000 [IU] | Freq: Three times a day (TID) | SUBCUTANEOUS | Status: DC
  Administered 2014-06-04 – 2014-06-05 (×6): 3 [IU] via SUBCUTANEOUS
  Administered 2014-06-06: 2 [IU] via SUBCUTANEOUS

## 2014-06-03 MED ORDER — FENTANYL CITRATE 0.05 MG/ML IJ SOLN
INTRAMUSCULAR | Status: AC
  Filled 2014-06-03: qty 5

## 2014-06-03 MED ORDER — ONDANSETRON HCL 4 MG/2ML IJ SOLN
4.0000 mg | Freq: Four times a day (QID) | INTRAMUSCULAR | Status: DC | PRN
  Administered 2014-06-04 – 2014-06-06 (×5): 4 mg via INTRAVENOUS
  Filled 2014-06-03 (×5): qty 2

## 2014-06-03 MED ORDER — LIDOCAINE HCL (CARDIAC) 20 MG/ML IV SOLN
INTRAVENOUS | Status: DC | PRN
  Administered 2014-06-03: 70 mg via INTRAVENOUS

## 2014-06-03 MED ORDER — ACETAMINOPHEN 325 MG PO TABS: 650.0000 mg | ORAL_TABLET | ORAL | Status: DC | PRN

## 2014-06-03 MED ORDER — MIDAZOLAM HCL 5 MG/5ML IJ SOLN
INTRAMUSCULAR | Status: DC | PRN
  Administered 2014-06-03: 2 mg via INTRAVENOUS

## 2014-06-03 MED ORDER — ONDANSETRON HCL 4 MG/2ML IJ SOLN
INTRAMUSCULAR | Status: AC
  Filled 2014-06-03: qty 2

## 2014-06-03 MED ORDER — INSULIN ASPART 100 UNIT/ML ~~LOC~~ SOLN
0.0000 [IU] | Freq: Every day | SUBCUTANEOUS | Status: DC
  Administered 2014-06-03: 2 [IU] via SUBCUTANEOUS

## 2014-06-03 MED ORDER — OXYMETAZOLINE HCL 0.05 % NA SOLN
NASAL | Status: DC | PRN
  Administered 2014-06-03: 2 via NASAL

## 2014-06-03 MED ORDER — LACTATED RINGERS IV SOLN
INTRAVENOUS | Status: DC | PRN
  Administered 2014-06-03: 13:00:00 via INTRAVENOUS

## 2014-06-03 MED ORDER — FLUTICASONE PROPIONATE 50 MCG/ACT NA SUSP
2.0000 | Freq: Every day | NASAL | Status: DC
  Administered 2014-06-05: 2 via NASAL
  Filled 2014-06-03 (×2): qty 16

## 2014-06-03 MED ORDER — ROCURONIUM BROMIDE 50 MG/5ML IV SOLN
INTRAVENOUS | Status: AC
  Filled 2014-06-03: qty 1

## 2014-06-03 MED ORDER — OXYCODONE-ACETAMINOPHEN 5-325 MG PO TABS
1.0000 | ORAL_TABLET | ORAL | Status: DC | PRN
  Administered 2014-06-03 – 2014-06-06 (×11): 2 via ORAL
  Filled 2014-06-03 (×11): qty 2

## 2014-06-03 MED ORDER — DEXAMETHASONE SODIUM PHOSPHATE 10 MG/ML IJ SOLN
INTRAMUSCULAR | Status: DC | PRN
  Administered 2014-06-03: 4 mg via INTRAVENOUS

## 2014-06-03 MED ORDER — PHENYLEPHRINE HCL 10 MG/ML IJ SOLN
INTRAMUSCULAR | Status: DC | PRN
  Administered 2014-06-03: 80 ug via INTRAVENOUS

## 2014-06-03 MED ORDER — ONDANSETRON HCL 4 MG PO TABS: 4.0000 mg | ORAL_TABLET | Freq: Four times a day (QID) | ORAL | Status: DC | PRN

## 2014-06-03 MED ORDER — HYDROMORPHONE HCL 1 MG/ML IJ SOLN
0.2500 mg | INTRAMUSCULAR | Status: DC | PRN
  Administered 2014-06-03 (×4): 0.5 mg via INTRAVENOUS

## 2014-06-03 MED ORDER — BENAZEPRIL HCL 10 MG PO TABS
10.0000 mg | ORAL_TABLET | Freq: Every day | ORAL | Status: DC
  Administered 2014-06-04 – 2014-06-05 (×2): 10 mg via ORAL
  Filled 2014-06-03 (×4): qty 1

## 2014-06-03 MED ORDER — KCL IN DEXTROSE-NACL 20-5-0.45 MEQ/L-%-% IV SOLN
INTRAVENOUS | Status: DC
  Administered 2014-06-03 – 2014-06-05 (×4): via INTRAVENOUS
  Administered 2014-06-06: 50 mL/h via INTRAVENOUS
  Filled 2014-06-03 (×7): qty 1000

## 2014-06-03 MED ORDER — HEPARIN SODIUM (PORCINE) 5000 UNIT/ML IJ SOLN
5000.0000 [IU] | Freq: Three times a day (TID) | INTRAMUSCULAR | Status: DC
  Administered 2014-06-04 – 2014-06-06 (×7): 5000 [IU] via SUBCUTANEOUS
  Filled 2014-06-03 (×12): qty 1

## 2014-06-03 MED ORDER — SUCCINYLCHOLINE CHLORIDE 20 MG/ML IJ SOLN
INTRAMUSCULAR | Status: DC | PRN
  Administered 2014-06-03: 70 mg via INTRAVENOUS

## 2014-06-03 MED ORDER — OXYMETAZOLINE HCL 0.05 % NA SOLN
NASAL | Status: AC
  Filled 2014-06-03: qty 15

## 2014-06-03 MED ORDER — SUCCINYLCHOLINE CHLORIDE 20 MG/ML IJ SOLN
INTRAMUSCULAR | Status: AC
  Filled 2014-06-03: qty 1

## 2014-06-03 MED ORDER — BUPIVACAINE-EPINEPHRINE (PF) 0.5% -1:200000 IJ SOLN
INTRAMUSCULAR | Status: DC | PRN
  Administered 2014-06-03: 30 mL

## 2014-06-03 SURGICAL SUPPLY — 54 items
APPLIER CLIP 5 13 M/L LIGAMAX5 (MISCELLANEOUS)
APPLIER CLIP ROT 10 11.4 M/L (STAPLE)
APR CLP MED LRG 11.4X10 (STAPLE)
APR CLP MED LRG 5 ANG JAW (MISCELLANEOUS)
BINDER ABD UNIV 12 45-62 (WOUND CARE) IMPLANT
BINDER ABDOMINAL 46IN 62IN (WOUND CARE)
CHLORAPREP W/TINT 26ML (MISCELLANEOUS) ×2 IMPLANT
CLIP APPLIE 5 13 M/L LIGAMAX5 (MISCELLANEOUS) IMPLANT
CLIP APPLIE ROT 10 11.4 M/L (STAPLE) IMPLANT
COVER SURGICAL LIGHT HANDLE (MISCELLANEOUS) ×2 IMPLANT
DEVICE SECURE STRAP 25 ABSORB (INSTRUMENTS) ×3 IMPLANT
DEVICE TROCAR PUNCTURE CLOSURE (ENDOMECHANICALS) ×2 IMPLANT
DRAPE LAPAROSCOPIC ABDOMINAL (DRAPES) ×2 IMPLANT
DRAPE UTILITY XL STRL (DRAPES) ×4 IMPLANT
ELECT REM PT RETURN 9FT ADLT (ELECTROSURGICAL) ×2
ELECTRODE REM PT RTRN 9FT ADLT (ELECTROSURGICAL) ×1 IMPLANT
GLOVE BIO SURGEON STRL SZ7 (GLOVE) ×2 IMPLANT
GLOVE BIO SURGEON STRL SZ8 (GLOVE) ×3 IMPLANT
GLOVE BIOGEL PI IND STRL 7.0 (GLOVE) IMPLANT
GLOVE BIOGEL PI IND STRL 7.5 (GLOVE) IMPLANT
GLOVE BIOGEL PI IND STRL 8 (GLOVE) ×1 IMPLANT
GLOVE BIOGEL PI INDICATOR 7.0 (GLOVE) ×1
GLOVE BIOGEL PI INDICATOR 7.5 (GLOVE) ×1
GLOVE BIOGEL PI INDICATOR 8 (GLOVE) ×1
GLOVE ECLIPSE 7.5 STRL STRAW (GLOVE) ×1 IMPLANT
GOWN STRL REUS W/ TWL LRG LVL3 (GOWN DISPOSABLE) ×2 IMPLANT
GOWN STRL REUS W/ TWL XL LVL3 (GOWN DISPOSABLE) ×1 IMPLANT
GOWN STRL REUS W/TWL LRG LVL3 (GOWN DISPOSABLE) ×4
GOWN STRL REUS W/TWL XL LVL3 (GOWN DISPOSABLE) ×2
KIT BASIN OR (CUSTOM PROCEDURE TRAY) ×2 IMPLANT
KIT ROOM TURNOVER OR (KITS) ×2 IMPLANT
LIQUID BAND (GAUZE/BANDAGES/DRESSINGS) ×2 IMPLANT
MARKER SKIN DUAL TIP RULER LAB (MISCELLANEOUS) ×2 IMPLANT
MESH VENTRALIGHT ST 6X8 (Mesh Specialty) ×2 IMPLANT
MESH VENTRLGHT ELLIPSE 8X6XMFL (Mesh Specialty) IMPLANT
NDL SPNL 22GX3.5 QUINCKE BK (NEEDLE) ×1 IMPLANT
NEEDLE 22X1 1/2 (OR ONLY) (NEEDLE) ×2 IMPLANT
NEEDLE SPNL 22GX3.5 QUINCKE BK (NEEDLE) ×2 IMPLANT
NS IRRIG 1000ML POUR BTL (IV SOLUTION) ×2 IMPLANT
PAD ARMBOARD 7.5X6 YLW CONV (MISCELLANEOUS) ×4 IMPLANT
SCALPEL HARMONIC ACE (MISCELLANEOUS) ×1 IMPLANT
SCISSORS LAP 5X35 DISP (ENDOMECHANICALS) IMPLANT
SET IRRIG TUBING LAPAROSCOPIC (IRRIGATION / IRRIGATOR) IMPLANT
SUT PROLENE 0 CT 1 CR/8 (SUTURE) ×2 IMPLANT
SUT VIC AB 4-0 PS2 27 (SUTURE) ×2 IMPLANT
SUT VICRYL 0 TIES 12 18 (SUTURE) IMPLANT
TOWEL OR 17X24 6PK STRL BLUE (TOWEL DISPOSABLE) ×2 IMPLANT
TOWEL OR 17X26 10 PK STRL BLUE (TOWEL DISPOSABLE) ×2 IMPLANT
TRAY FOLEY CATH 16FR SILVER (SET/KITS/TRAYS/PACK) ×2 IMPLANT
TRAY LAPAROSCOPIC (CUSTOM PROCEDURE TRAY) ×2 IMPLANT
TROCAR XCEL BLUNT TIP 100MML (ENDOMECHANICALS) IMPLANT
TROCAR XCEL NON-BLD 11X100MML (ENDOMECHANICALS) ×2 IMPLANT
TROCAR XCEL NON-BLD 5MMX100MML (ENDOMECHANICALS) ×4 IMPLANT
TUBING INSUFFLATION (TUBING) ×2 IMPLANT

## 2014-06-03 NOTE — Anesthesia Preprocedure Evaluation (Addendum)
Anesthesia Evaluation  Patient identified by MRN, date of birth, ID band Patient awake    Reviewed: Allergy & Precautions, H&P , NPO status , Patient's Chart, lab work & pertinent test results  Airway Mallampati: II  TM Distance: >3 FB     Dental  (+) Teeth Intact, Dental Advisory Given   Pulmonary shortness of breath and with exertion, sleep apnea and Continuous Positive Airway Pressure Ventilation ,  breath sounds clear to auscultation        Cardiovascular hypertension, Pt. on medications Rhythm:Regular     Neuro/Psych negative neurological ROS  negative psych ROS   GI/Hepatic negative GI ROS, Neg liver ROS,   Endo/Other  diabetes, Well Controlled, Type 2, Oral Hypoglycemic Agents  Renal/GU negative Renal ROS  negative genitourinary   Musculoskeletal  (+) Arthritis -,   Abdominal (+)  Abdomen: soft. Bowel sounds: normal.  Peds negative pediatric ROS (+)  Hematology  (+) anemia ,   Anesthesia Other Findings   Reproductive/Obstetrics                          Anesthesia Physical Anesthesia Plan  ASA: III  Anesthesia Plan: General   Post-op Pain Management:    Induction: Intravenous  Airway Management Planned: Oral ETT  Additional Equipment:   Intra-op Plan:   Post-operative Plan: Extubation in OR  Informed Consent: I have reviewed the patients History and Physical, chart, labs and discussed the procedure including the risks, benefits and alternatives for the proposed anesthesia with the patient or authorized representative who has indicated his/her understanding and acceptance.   Dental advisory given  Plan Discussed with: Anesthesiologist and Surgeon  Anesthesia Plan Comments:         Anesthesia Quick Evaluation

## 2014-06-03 NOTE — Transfer of Care (Signed)
Immediate Anesthesia Transfer of Care Note  Patient: Sierra Johnson  Procedure(s) Performed: Procedure(s): LAPAROSCOPIC REPAIR RECURRENT  INCISIONAL HERNIA (N/A) INSERTION OF MESH (N/A)  Patient Location: PACU  Anesthesia Type:General  Level of Consciousness: awake and alert   Airway & Oxygen Therapy: Patient Spontanous Breathing and Patient connected to nasal cannula oxygen  Post-op Assessment: Report given to PACU RN, Post -op Vital signs reviewed and stable and Patient moving all extremities X 4  Post vital signs: Reviewed and stable  Complications: No apparent anesthesia complications

## 2014-06-03 NOTE — Interval H&P Note (Signed)
History and Physical Interval Note:  06/03/2014 12:35 PM  Sierra Johnson  has presented today for surgery, with the diagnosis of recurrent incisional hernia  The various methods of treatment have been discussed with the patient and family. After consideration of risks, benefits and other options for treatment, the patient has consented to  Procedure(s): [place] (N/A) INSERTION OF MESH (N/A) as a surgical intervention .  The patient's history has been reviewed, patient re-examined, no change in status, stable for surgery.  I have reviewed the patient's chart and labs.  Questions were answered to the patient's satisfaction.     Donia Yokum E

## 2014-06-03 NOTE — Anesthesia Procedure Notes (Signed)
Procedure Name: Intubation Date/Time: 06/03/2014 1:22 PM Performed by: Maeola Harman Pre-anesthesia Checklist: Patient identified, Emergency Drugs available, Suction available, Patient being monitored and Timeout performed Patient Re-evaluated:Patient Re-evaluated prior to inductionOxygen Delivery Method: Circle system utilized Preoxygenation: Pre-oxygenation with 100% oxygen Intubation Type: IV induction Ventilation: Mask ventilation without difficulty Laryngoscope Size: Mac and 3 Grade View: Grade I Tube type: Oral Number of attempts: 1 Airway Equipment and Method: Stylet Placement Confirmation: ETT inserted through vocal cords under direct vision,  positive ETCO2 and breath sounds checked- equal and bilateral Secured at: 22 cm Tube secured with: Tape Dental Injury: Teeth and Oropharynx as per pre-operative assessment

## 2014-06-03 NOTE — Op Note (Signed)
06/03/2014  2:26 PM  PATIENT:  Sierra Johnson  63 y.o. female  PRE-OPERATIVE DIAGNOSIS:  Recurrent incisional hernia  POST-OPERATIVE DIAGNOSIS:  Recurrent incisional hernia  PROCEDURE:  Procedure(s): LAPAROSCOPIC REPAIR RECURRENT  INCISIONAL HERNIA INSERTION OF MESH 20x15cm VENTRALIGHT  SURGEON:  Surgeon(s): Georganna Skeans, MD  ASSISTANTS: Judyann Munson, RNFA   ANESTHESIA:   local and general  EBL:   5cc  BLOOD ADMINISTERED:none  DRAINS: none   SPECIMEN:  No Specimen  DISPOSITION OF SPECIMEN:  N/A  COUNTS:  YES  DICTATION: .Dragon Dictation [name] presents for laparoscopic repair of recurrent incisional hernia with mesh. She was identified in the preop holding area. She received intravenous antibiotics. Informed consent was obtained. She was brought to the operating room and general endotracheal anesthesia was administered by the anesthesia staff. Abdomen was prepped and draped in sterile fashion. Time out procedure was performed. She was placed head up and rotated towards the right. Area beneath the left costal margin was anesthetized with local. Small incision was made and 5 mm Optiview port was used to enter the abdomen under direct vision. Abdomen was insufflated. Scope was inserted in full inspection of that area revealed no complications. Under direct vision, a left millimeter left lower quadrant 5 mm right upper quadrant and 5 mm right lower quadrant ports were placed. Local was used at all port sites. The hernia mostly reduced on insufflation. There were adhesions of omentum up to it which were all taken down using harmonic scalpel. Additionally, we trimmed the falciform back using the harmonic scalpel to give [place] room to place the mesh superiorly. The defect was then measured and was about 5 cm, however I wanted to cover her umbilicus as well. Therefore selected a 20 x 15 cm mesh to give excellent overlay and coverage. 6 Prolene sutures were placed along the mesh. It was  soaked in saline and then inserted into the abdomen. It was oriented. 6 small stab wounds were then sequentially made and the Endo close was used to grab each suture separately. These were all tied in the mesh came up nicely against the anterior abdominal wall. It was flat. I then put 2 concentric rings of secure strap tacks holding the mesh in place. Mesh was then fully inspected. There was good hemostasis. Omentum was reinspected and there was no bleeding. Pneumoperitoneum was released. Ports were removed. Skin of each port site was closed with running 4 Vicryl subcuticular suture. The small stab wounds for the mesh sutures were closed with Dermabond. Dermabond was placed on the port sites as well. A sterile dressing and abdominal binder were applied. Patient tolerated procedure well without apparent complication and was taken recovery in stable condition.  PATIENT DISPOSITION:  PACU - hemodynamically stable.   Delay start of Pharmacological VTE agent (>24hrs) due to surgical blood loss or risk of bleeding:  no  Georganna Skeans, MD, MPH, FACS Pager: [phone]  12/16/20152:26 PM

## 2014-06-04 DIAGNOSIS — K432 Incisional hernia without obstruction or gangrene: Secondary | ICD-10-CM | POA: Diagnosis not present

## 2014-06-04 LAB — GLUCOSE, CAPILLARY
GLUCOSE-CAPILLARY: 116 mg/dL — AB (ref 70–99)
Glucose-Capillary: 199 mg/dL — ABNORMAL HIGH (ref 70–99)
Glucose-Capillary: 175 mg/dL — ABNORMAL HIGH (ref 70–99)
Glucose-Capillary: 168 mg/dL — ABNORMAL HIGH (ref 70–99)

## 2014-06-04 MED ORDER — ALUM & MAG HYDROXIDE-SIMETH 200-200-20 MG/5ML PO SUSP
30.0000 mL | ORAL | Status: DC | PRN
  Administered 2014-06-04 – 2014-06-05 (×4): 30 mL via ORAL
  Filled 2014-06-04 (×4): qty 30

## 2014-06-04 MED ORDER — MENTHOL 3 MG MT LOZG
1.0000 | LOZENGE | OROMUCOSAL | Status: DC | PRN
  Administered 2014-06-04: 3 mg via ORAL
  Filled 2014-06-04: qty 9

## 2014-06-04 MED ORDER — PROMETHAZINE HCL 25 MG/ML IJ SOLN
12.5000 mg | INTRAMUSCULAR | Status: DC | PRN
  Administered 2014-06-05 – 2014-06-06 (×2): 12.5 mg via INTRAVENOUS
  Filled 2014-06-04 (×3): qty 1

## 2014-06-04 NOTE — Progress Notes (Signed)
1 Day Post-Op  Subjective: Quite sore, has needed morphine overnight, tolerating PO   Objective: Vital signs in last 24 hours: Temp:  [98.1 F (36.7 C)-98.8 F (37.1 C)] 98.4 F (36.9 C) (12/17 0500) Pulse Rate:  [68-92] 85 (12/17 0500) Resp:  [13-19] 16 (12/17 0500) BP: (102-136)/(57-83) 130/83 mmHg (12/17 0500) SpO2:  [96 %-100 %] 96 % (12/17 0500) Weight:  [236 lb (107.049 kg)] 236 lb (107.049 kg) (12/16 1111)    Intake/Output from previous day: 12/16 0701 - 12/17 0700 In: 1570 [P.O.:440; I.V.:1130] Out: 470 [Urine:450; Blood:20] Intake/Output this shift:    General appearance: alert and cooperative Resp: clear to auscultation bilaterally Cardio: regular rate and rhythm GI: soft, incisions CDI, bider on  Lab Results:   Recent Labs  06/03/14 1840  WBC 14.0*  HGB 12.6  HCT 38.8  PLT 280   BMET  Recent Labs  06/03/14 1840  CREATININE 0.93   PT/INR No results for input(s): LABPROT, INR in the last 72 hours. ABG No results for input(s): PHART, HCO3 in the last 72 hours.  Invalid input(s): PCO2, PO2  Studies/Results: No results found.  Anti-infectives: Anti-infectives    Start     Dose/Rate Route Frequency Ordered Stop   06/03/14 0600  ceFAZolin (ANCEF) IVPB 2 g/50 mL premix     2 g100 mL/hr over 30 Minutes Intravenous On call to O.R. 06/02/14 1355 06/03/14 1339      Assessment/Plan: s/p Procedure(s): LAPAROSCOPIC REPAIR RECURRENT  INCISIONAL HERNIA (N/A) INSERTION OF MESH (N/A) POD#1 Advance diet as tolerated Will check this PM and D/C if pain control adequate on POs VTE - SQ hep  LOS: 1 day    Layce Sprung E 06/04/2014

## 2014-06-04 NOTE — Anesthesia Postprocedure Evaluation (Signed)
  Anesthesia Post-op Note  Patient: Sierra Johnson  Procedure(s) Performed: Procedure(s): LAPAROSCOPIC REPAIR RECURRENT  INCISIONAL HERNIA (N/A) INSERTION OF MESH (N/A)  Patient Location: PACU  Anesthesia Type:General  Level of Consciousness: awake and alert   Airway and Oxygen Therapy: Patient Spontanous Breathing  Post-op Pain: mild  Post-op Assessment: Post-op Vital signs reviewed, Patient's Cardiovascular Status Stable, Respiratory Function Stable, Patent Airway, No signs of Nausea or vomiting and Pain level controlled  Post-op Vital Signs: Reviewed and stable  Last Vitals:  Filed Vitals:   06/04/14 0500  BP: 130/83  Pulse: 85  Temp: 36.9 C  Resp: 16    Complications: No apparent anesthesia complications

## 2014-06-04 NOTE — Progress Notes (Signed)
Patient ID: Sierra Johnson, female   DOB: [birth year], 63 y.o.   MRN: [id number] C/O nausea. Will add phenergan. Keep until tomorrow. Georganna Skeans, MD, MPH, FACS Trauma: [phone] General Surgery: [phone]

## 2014-06-04 NOTE — Progress Notes (Signed)
UR completed 

## 2014-06-04 NOTE — Progress Notes (Signed)
Pt setup and placed on Auto BiPAP (min EPAP 6.0, Max IPAP 13.0) via Pts personal nasal pillows and tubing.  Machine belongs to [facility].  Pt currently stable and tolerating well.  RT to monitor as needed.

## 2014-06-05 ENCOUNTER — Encounter (HOSPITAL_COMMUNITY): Payer: Self-pay | Admitting: General Surgery

## 2014-06-05 DIAGNOSIS — K432 Incisional hernia without obstruction or gangrene: Secondary | ICD-10-CM | POA: Diagnosis not present

## 2014-06-05 LAB — GLUCOSE, CAPILLARY
GLUCOSE-CAPILLARY: 166 mg/dL — AB (ref 70–99)
Glucose-Capillary: 153 mg/dL — ABNORMAL HIGH (ref 70–99)
Glucose-Capillary: 160 mg/dL — ABNORMAL HIGH (ref 70–99)
Glucose-Capillary: 117 mg/dL — ABNORMAL HIGH (ref 70–99)

## 2014-06-05 MED ORDER — TRAMADOL HCL 50 MG PO TABS
50.0000 mg | ORAL_TABLET | Freq: Four times a day (QID) | ORAL | Status: DC | PRN
  Administered 2014-06-05: 50 mg via ORAL
  Filled 2014-06-05 (×2): qty 1

## 2014-06-05 NOTE — Progress Notes (Signed)
Patient has been complaining of heart burn as well as pain in the abdomen.  PRN maalox given and PRN percocet given.  Patient is relieved after administrations.  Will continue to monitor for further changes in status

## 2014-06-05 NOTE — Progress Notes (Signed)
2 Days Post-Op  Subjective: Still intermittent nausea, pain not well controlled  Objective: Vital signs in last 24 hours: Temp:  [97.6 F (36.4 C)-98.5 F (36.9 C)] 98.2 F (36.8 C) (12/18 0533) Pulse Rate:  [70-84] 80 (12/18 0533) Resp:  [16-18] 18 (12/18 0533) BP: (141-178)/(80-96) 141/80 mmHg (12/18 0533) SpO2:  [95 %-97 %] 97 % (12/18 0533) Last BM Date:  (patient is unsure.)  Intake/Output from previous day: 12/17 0701 - 12/18 0700 In: 3195 [P.O.:870; I.V.:2325] Out: 2150 [Urine:2150] Intake/Output this shift:    General appearance: alert and cooperative Resp: clear to auscultation bilaterally Cardio: regular rate and rhythm GI: soft, incisions CDI  Lab Results:   Recent Labs  06/03/14 1840  WBC 14.0*  HGB 12.6  HCT 38.8  PLT 280   BMET  Recent Labs  06/03/14 1840  CREATININE 0.93   PT/INR No results for input(s): LABPROT, INR in the last 72 hours. ABG No results for input(s): PHART, HCO3 in the last 72 hours.  Invalid input(s): PCO2, PO2  Studies/Results: No results found.  Anti-infectives: Anti-infectives    Start     Dose/Rate Route Frequency Ordered Stop   06/03/14 0600  ceFAZolin (ANCEF) IVPB 2 g/50 mL premix     2 g100 mL/hr over 30 Minutes Intravenous On call to O.R. 06/02/14 1355 06/03/14 1339      Assessment/Plan: POD#2 S/P LAP VH repair Decrease IVF Add Ultram and use morphine PRN VTE - SQ hep DIspo - plan home tomorrow  LOS: 2 days    Garin Mata E 06/05/2014

## 2014-06-05 NOTE — Progress Notes (Signed)
UR completed 

## 2014-06-06 DIAGNOSIS — K432 Incisional hernia without obstruction or gangrene: Secondary | ICD-10-CM | POA: Diagnosis not present

## 2014-06-06 MED ORDER — OXYCODONE-ACETAMINOPHEN 5-325 MG PO TABS: 1.0000 | ORAL_TABLET | ORAL | Status: DC | PRN

## 2014-06-06 MED ORDER — PROMETHAZINE HCL 12.5 MG PO TABS: 12.5000 mg | ORAL_TABLET | ORAL | Status: DC | PRN

## 2014-06-06 MED ORDER — TRAMADOL HCL 50 MG PO TABS: 50.0000 mg | ORAL_TABLET | Freq: Four times a day (QID) | ORAL | Status: DC | PRN

## 2014-06-06 MED ORDER — ONDANSETRON HCL 4 MG PO TABS: 4.0000 mg | ORAL_TABLET | Freq: Four times a day (QID) | ORAL | Status: DC | PRN

## 2014-06-06 NOTE — Progress Notes (Signed)
Utilization Review completed.  

## 2014-06-06 NOTE — Progress Notes (Signed)
Discharge home. Home discharge instruction given, no questions verbalized. 

## 2014-06-06 NOTE — Discharge Summary (Signed)
Physician Discharge Summary  Patient ID: Sierra Johnson MRN: [id number] DOB/AGE: 1950-12-03 63 y.o.  Admit date: 06/03/2014 Discharge date: 06/06/2014  Admission Diagnoses:recurrent incisional hernia  Discharge Diagnoses: S/P laparoscopic repair recurrent incisional hernia with mesh Active Problems:   S/P laparoscopic hernia repair   Discharged Condition: stable  Hospital Course: She tolerated surgery well but had a lot of pain post-op. Also had some nausea. Both responded to medication adjustment. Doing better POD#3 and ready to go home. Still occasional mild nausea so will send on Zofran PRN.  Consults: None  Significant Diagnostic Studies: none  Treatments: surgery  Discharge Exam: Blood pressure 118/82, pulse 80, temperature 98.3 F (36.8 C), temperature source Oral, resp. rate 18, height 5' (1.524 m), weight 236 lb (107.049 kg), SpO2 95 %. GI: soft, incisions CDI, no sig tenderness  Disposition:   Discharge Instructions    Diet - low sodium heart healthy    Complete by:  As directed      Discharge instructions    Complete by:  As directed   CC[facility] _______[facility]  UMBILICAL OR INGUINAL HERNIA REPAIR: POST OP INSTRUCTIONS  Always review your discharge instruction sheet given to you by the facility where your surgery was performed. IF YOU HAVE DISABILITY OR FAMILY LEAVE FORMS, YOU MUST BRING THEM TO THE OFFICE FOR PROCESSING.   DO NOT GIVE THEM TO YOUR DOCTOR.  A  prescription for pain medication may be given to you upon discharge.  Take your pain medication as prescribed, if needed.  If narcotic pain medicine is not needed, then you may take acetaminophen (Tylenol) or ibuprofen (Advil) as needed. Take your usually prescribed medications unless otherwise directed. If you need a refill on your pain medication, please contact your pharmacy.  They will contact our office to request authorization. Prescriptions will not be filled after 5 pm or on  week-ends. You should follow a light diet the first 24 hours after arrival home, such as soup and crackers, etc.  Be sure to include lots of fluids daily.  Resume your normal diet the day after surgery. Most patients will experience some swelling and bruising around the umbilicus or in the groin and scrotum.  Ice packs and reclining will help.  Swelling and bruising can take several days to resolve.  It is common to experience some constipation if taking pain medication after surgery.  Increasing fluid intake and taking a stool softener (such as Colace) will usually help or prevent this problem from occurring.  A mild laxative (Milk of Magnesia or Miralax) should be taken according to package directions if there are no bowel movements after 48 hours. Unless discharge instructions indicate otherwise, you may remove your bandages 24-48 hours after surgery, and you may shower at that time.  You may have steri-strips (small skin tapes) in place directly over the incision.  These strips should be left on the skin for 7-10 days.  If your surgeon used skin glue on the incision, you may shower in 24 hours.  The glue will flake off over the next 2-3 weeks.  Any sutures or staples will be removed at the office during your follow-up visit. ACTIVITIES:  You may resume regular (light) daily activities beginning the next day-such as daily self-care, walking, climbing stairs-gradually increasing activities as tolerated.  You may have sexual intercourse when it is comfortable.  Refrain from any heavy lifting or straining until approved by your doctor. You may drive when you are no longer taking prescription pain  medication, you can comfortably wear a seatbelt, and you can safely maneuver your car and apply brakes. RETURN TO WORK:  __________________________________________________________ Dennis Bast should see your doctor in the office for a follow-up appointment approximately 2-3 weeks after your surgery.  Make sure that you call  for this appointment within a day or two after you arrive home to insure a convenient appointment time. OTHER INSTRUCTIONS:  __________________________________________________________________________________________________________________________________________________________________________________________  WHEN TO CALL YOUR DOCTOR: Fever over 101.0 Inability to urinate Nausea and/or vomiting Extreme swelling or bruising Continued bleeding from incision. Increased pain, redness, or drainage from the incision  The clinic staff is available to answer your questions during regular business hours.  Please don't hesitate to call and ask to speak to one of the nurses for clinical concerns.  If you have a medical emergency, go to the nearest emergency room or call 911.  A surgeon from [facility] is always on call at the hospital   [address], [place], [place], [place]  [address] ?  [address], [place], [place]   [address] [phone] ? [phone] ? FAX [phone] Web site: [email or web address]     Discharge wound care:    Complete by:  As directed   Wear binder when up for support, no dressings needed     Increase activity slowly    Complete by:  As directed             Medication List    TAKE these medications        benazepril 10 MG tablet  Commonly known as:  LOTENSIN  Take 1 tablet (10 mg total) by mouth daily. TAKE 1 tab BY MOUTH EVERY DAY AT BEDTIME     fish oil-omega-3 fatty acids 1000 MG capsule  Take 1 capsule by mouth 2 (two) times daily.     fluticasone 50 MCG/ACT nasal spray  Commonly known as:  FLONASE  Place 2 sprays into the nose as needed for allergies.     metFORMIN 500 MG tablet  Commonly known as:  GLUCOPHAGE  Take 1 tablet (500 mg total) by mouth 2 (two) times daily with a meal.     ondansetron 4 MG tablet  Commonly known as:  ZOFRAN  Take 1 tablet (4 mg total) by mouth every 6 (six) hours as needed for  nausea.     oxyCODONE-acetaminophen 5-325 MG per tablet  Commonly known as:  PERCOCET/ROXICET  Take 1-2 tablets by mouth every 4 (four) hours as needed for moderate pain.     traMADol 50 MG tablet  Commonly known as:  ULTRAM  Take 1 tablet (50 mg total) by mouth every 6 (six) hours as needed for moderate pain.     vitamin B-12 1000 MCG tablet  Commonly known as:  CYANOCOBALAMIN  Take 1,000 mcg by mouth daily.     Vitamin D3 1000 UNITS Caps  Take 1 capsule by mouth daily.           Follow-up Information    Follow up with Zenovia Jarred, MD On 06/24/2014.   Specialty:  General Surgery   Why:  as scheduled   Contact information:   [address] [place] [address] [phone]       Signed: Zenovia Jarred 06/06/2014, 7:53 AM

## 2014-06-08 LAB — GLUCOSE, CAPILLARY: GLUCOSE-CAPILLARY: 146 mg/dL — AB (ref 70–99)

## 2014-08-12 ENCOUNTER — Telehealth: Payer: Self-pay | Admitting: Internal Medicine

## 2014-08-12 MED ORDER — GLUCOSE BLOOD VI STRP: ORAL_STRIP | Status: DC

## 2014-08-12 MED ORDER — ONETOUCH DELICA LANCETS 33G MISC: 1.0000 | Freq: Two times a day (BID) | Status: DC | PRN

## 2014-08-12 NOTE — Telephone Encounter (Signed)
Pt called in said that meter stop working and ins is not going to cover another one.  What would be the best option.  She dont mind having a different brand

## 2014-08-12 NOTE — Telephone Encounter (Signed)
Pt informed [employer] free sample meter is upfront for her to p/u. Will send new rx for strips and lancets to [facility].

## 2014-08-13 ENCOUNTER — Ambulatory Visit (INDEPENDENT_AMBULATORY_CARE_PROVIDER_SITE_OTHER): Payer: BLUE CROSS/BLUE SHIELD | Admitting: Podiatry

## 2014-08-13 ENCOUNTER — Ambulatory Visit (INDEPENDENT_AMBULATORY_CARE_PROVIDER_SITE_OTHER): Payer: BLUE CROSS/BLUE SHIELD

## 2014-08-13 ENCOUNTER — Encounter: Payer: Self-pay | Admitting: Podiatry

## 2014-08-13 VITALS — BP 118/76 | HR 85 | Resp 16

## 2014-08-13 DIAGNOSIS — M2141 Flat foot [pes planus] (acquired), right foot: Secondary | ICD-10-CM

## 2014-08-13 DIAGNOSIS — M722 Plantar fascial fibromatosis: Secondary | ICD-10-CM

## 2014-08-13 DIAGNOSIS — M2142 Flat foot [pes planus] (acquired), left foot: Secondary | ICD-10-CM

## 2014-08-13 NOTE — Patient Instructions (Signed)
Flat Feet Having flat feet is a common condition. One foot or both might be affected. People of any age can have flat feet. In fact, everyone is born with them. But most of the time, the foot gradually develops an arch. That is the curve on the bottom of the foot that creates a gap between the foot and the ground. An arch usually develops in childhood. Sometimes, though, an arch never develops and the foot stays flat on the bottom. Other times, an arch develops but later collapses (caves in). That is what gives the condition its nickname, "fallen arches." The medical term for flat feet is pes planus. Some people have flat feet their whole life and have no problems. For others, the condition causes pain and needs to be corrected.  CAUSES   A problem with the foot's soft tissue; tendons and ligaments could be loose.  This can cause what is called flexible flat feet. That means the shape of the foot changes with pressure. When standing on the toes, a curved arch can be seen. When standing on the ground, the foot is flat.  Wear and tear. Sometimes arches simply flatten over time.  Damage to the posterior tibial tendon. This is the tendon that goes from the inside of the ankle to the bones in the middle of the foot. It is the main support for the arch. If the tendon is injured, stretched or torn, the arch might flatten.  Tarsal coalition. With this condition, two or more bones in the foot are joined together (fused ) during development in the womb. This limits movement and can lead to a flat foot. SYMPTOMS   The foot is even with the ground from toe to heel. Your caregiver will look closely at the inside of the foot while you are standing.  Pain along the bottom of the foot. Some people describe the pain as tightness.  Swelling on the inside of the foot or ankle.  Changes in the way you walk (gait).  The feet lean inward, starting at the ankle (pronation). DIAGNOSIS  To decide if a child or  adult has flat feet, a healthcare provider will probably:  Do a physical examination. This might include having the person stand on his or her toes and then stand normally. The caregiver will also hold the foot and put pressure on the foot in different directions.  Check the person's shoes. The pattern of wear on the soles can offer clues.  Order images (pictures) of the foot. They can help identify the cause of any pain. They also will show injuries to bones or tendons that could be causing the condition. The images can come from:  X-rays.  Computed tomography (CT) scan. This combines X-ray and a computer.  Magnetic resonance imaging (MRI). This uses magnets, radio waves and a computer to take a picture of the foot. It is the best technique to evaluate tendons, ligaments and muscles. TREATMENT   Flexible flat feet usually are painless. Most of the time, gait is not affected. Most children grow out of the condition. Often no treatment is needed. If there is pain, treatment options include:  Orthotics. These are inserts that go in the shoes. They add support and shape to the feet. An orthotic is custom-made from a mold of the foot.  Shoes. Not all shoes are the same. People with flat feet need arch support. However, too much can be painful. It is important to find shoes that offer the right amount  of support. Athletes, especially runners, may need to try shoes made just for people with flatter feet.  Medication. For pain, only take over-the-counter medicine for pain, discomfort, as directed by your caregiver.  Rest. If the feet start to hurt, cut back on the exercise which increases the pain. Use common sense.  For damage to the posterior tibial tendon, options include:  Orthotics. Also adding a wedge on the inside edge may help. This can relieve pressure on the tendon.  Ankle brace, boot or cast. These supports can ease the load on the tendon while it heals.  Surgery. If the tendon is  torn, it might need to be repaired.  For tarsal coalition, similar options apply:  Pain medication.  Orthotics.  A cast and crutches. This keeps weight off the foot.  Physical therapy.  Surgery to remove the bone bridge joining the two bones together. PROGNOSIS  In most people, flat feet do not cause pain or problems. People can go about their normal activities. However, if flat feet are painful, they can and should be treated. Treatment usually relieves the pain. HOME CARE INSTRUCTIONS   Take any medications prescribed by the healthcare provider. Follow the directions carefully.  Wear, or make sure a child wears, orthotics or special shoes if this was suggested. Be sure to ask how often and for how long they should be worn.  Do any exercises or therapy treatments that were suggested.  Take notes on when the pain occurs. This will help healthcare providers decide how to treat the condition.  If surgery is needed, be sure to find out if there is anything that should or should not be done before the operation. SEEK MEDICAL CARE IF:   Pain worsens in the foot or lower leg.  Pain disappears after treatment, but then returns.  Walking or simple exercise becomes difficult or causes foot pain.  Orthotics or special shoes are uncomfortable or painful. Document Released: 04/02/2009 Document Revised: 08/28/2011 Document Reviewed: 04/02/2009 [facility] Patient Information 2015 [place], [place]. This information is not intended to replace advice given to you by your health care provider. Make sure you discuss any questions you have with your health care provider.

## 2014-08-13 NOTE — Progress Notes (Signed)
   Subjective:    Patient ID: Sierra Johnson, female    DOB: 01-Jul-1950, 64 y.o.   MRN: [id number]  HPI Comments: "I am flat footed"  Patient c/o flat feet bilateral for several years. She has worn orthotics for awhile, but they are now worn out. Requesting a new pair. Feet are not hurting but feels pain in her knees.     Review of Systems  Musculoskeletal: Positive for arthralgias and gait problem.  All other systems reviewed and are negative.      Objective:   Physical Exam: I have reviewed her past medical history medications allergies surgery social history and review of systems. Pulses are strongly palpable bilateral. Neurologic sensorium is intact per Semmes-Weinstein monofilament. Deep tendon reflexes are intact bilaterally muscle strength +5 over 5 dorsiflexion plantar flexors and inverters everters all intrinsic musculature is intact. Orthopedic evaluation and straight solid joints distal to the ankle for range of motion without crepitation. She does have flexible flatfoot deformity bilateral. This is confirmed by radiographs. Cutaneous evaluation demonstrates supple well-hydrated Q is no erythema or edema cellulitis drainage or odor. Mild tenderness on palpation along the medial longitudinal band of the plantar fascia is noted also on physical examination.        Assessment & Plan:  Assessment: Pes planus with plantar fasciitis bilateral.  Plan: Scan for new set of orthotics.

## 2014-09-08 ENCOUNTER — Ambulatory Visit: Payer: BLUE CROSS/BLUE SHIELD | Admitting: *Deleted

## 2014-09-08 DIAGNOSIS — M722 Plantar fascial fibromatosis: Secondary | ICD-10-CM

## 2014-09-08 NOTE — Patient Instructions (Signed)

## 2014-09-08 NOTE — Progress Notes (Signed)
Patient ID: Sierra Johnson, female   DOB: 17-Feb-1951, 65 y.o.   MRN: [id number] PICKING UP INSERTS

## 2014-11-30 ENCOUNTER — Telehealth: Payer: Self-pay | Admitting: Internal Medicine

## 2014-11-30 NOTE — Telephone Encounter (Signed)
Patient states she was in the office for her wellness exam on 04/30/14 and ended up getting billed for her exam.  I review the things that they discussed that he had coded.  Patient states that Dr. Alain Marion does not treat her for any of this.  Patient states she has specialist for all these issues and is requesting these things to be taken off and refilled.

## 2014-12-14 ENCOUNTER — Other Ambulatory Visit: Payer: Self-pay

## 2014-12-25 ENCOUNTER — Emergency Department (INDEPENDENT_AMBULATORY_CARE_PROVIDER_SITE_OTHER)
Admission: EM | Admit: 2014-12-25 | Discharge: 2014-12-25 | Disposition: A | Discharge status: Home / Self Care | Payer: Self-pay | Source: Home / Self Care | Attending: Family Medicine | Admitting: Family Medicine

## 2014-12-25 ENCOUNTER — Encounter (HOSPITAL_COMMUNITY): Payer: Self-pay | Admitting: Emergency Medicine

## 2014-12-25 DIAGNOSIS — T783XXA Angioneurotic edema, initial encounter: Secondary | ICD-10-CM

## 2014-12-25 MED ORDER — AMLODIPINE BESYLATE 10 MG PO TABS: 10.0000 mg | ORAL_TABLET | Freq: Every day | ORAL | Status: DC

## 2014-12-25 NOTE — ED Notes (Signed)
Pt woke up this morning with a swollen upper lip.  She states she has not started any new creams or medicines, no new foods and no new medications.  She denies any pain, swelling of the throat or itching.

## 2014-12-25 NOTE — ED Notes (Signed)
Pt instructed to follow up w/ her PCP and let them know of her medication change and to make an appointment ASAP to be monitored for any BP changes with the new medication.

## 2014-12-25 NOTE — ED Provider Notes (Signed)
CSN: [id number]     Arrival date & time 12/25/14  1819 History   First MD Initiated Contact with Patient 12/25/14 1852     Chief Complaint  Patient presents with  . Oral Swelling   (Consider location/radiation/quality/duration/timing/severity/associated sxs/prior Treatment) Patient is a 64 y.o. female presenting with allergic reaction. The history is provided by the patient.  Allergic Reaction Presenting symptoms: swelling   Presenting symptoms: no difficulty breathing, no difficulty swallowing, no itching and no wheezing   Severity:  Mild Prior allergic episodes:  No prior episodes Context comment:  On ACE for bp x 61yr. Relieved by:  None tried Worsened by:  Nothing tried Ineffective treatments:  Antihistamines   Past Medical History  Diagnosis Date  . ANEMIA-NOS 04/24/2007  . HYPERLIPIDEMIA 07/17/2007    mild  . VITAMIN B12 DEFICIENCY 04/24/2007  . DIABETES MELLITUS, TYPE II 04/24/2007  . OBSTRUCTIVE SLEEP APNEA 09/30/2009    on CPAP 2011  . HYPERTENSION 04/24/2007  . Menopause 1998    Dr. Gaetano Net  . Elevated glucose 2011   Past Surgical History  Procedure Laterality Date  . Abdominal hysterectomy    . Hernia repair  11/11    Incar. midline hernia  . Incisional hernia repair N/A 06/03/2014    Procedure: LAPAROSCOPIC REPAIR RECURRENT  INCISIONAL HERNIA;  Surgeon: Georganna Skeans, MD;  Location: [facility];  Service: General;  Laterality: N/A;  . Insertion of mesh N/A 06/03/2014    Procedure: INSERTION OF MESH;  Surgeon: Georganna Skeans, MD;  [facility];  Service: General;  Laterality: N/A;   Family History  Problem Relation Age of Onset  . Hypertension Other   . Heart disease Mother 30    ?CAD  . Cancer Father     lung  . Heart disease Brother 44    CAD   History  Substance Use Topics  . Smoking status: Never Smoker   . Smokeless tobacco: Never Used  . Alcohol Use: No   OB History    No data available     Review of Systems  Constitutional: Negative.   HENT:  Positive for facial swelling. Negative for trouble swallowing.   Respiratory: Negative.  Negative for wheezing.   Cardiovascular: Negative.   Skin: Negative for itching.    Allergies  Tetracycline  Home Medications   Prior to Admission medications   Medication Sig Start Date End Date Taking? Authorizing Provider  benazepril (LOTENSIN) 10 MG tablet Take 1 tablet (10 mg total) by mouth daily. TAKE 1 tab BY MOUTH EVERY DAY AT BEDTIME 04/30/14  Yes Aleksei Plotnikov V, MD  amLODipine (NORVASC) 10 MG tablet Take 1 tablet (10 mg total) by mouth daily. 12/25/14   Billy Fischer, MD  Cholecalciferol (VITAMIN D3) 1000 UNITS CAPS Take 1 capsule by mouth daily.     Historical Provider, MD  fish oil-omega-3 fatty acids 1000 MG capsule Take 1 capsule by mouth 2 (two) times daily.      Historical Provider, MD  fluticasone (FLONASE) 50 MCG/ACT nasal spray Place 2 sprays into the nose as needed for allergies.  01/08/13   Aleksei Plotnikov V, MD  glucose blood (ONETOUCH VERIO) test strip Use twice daily as instructed 08/12/14   Lew Dawes V, MD  metFORMIN (GLUCOPHAGE) 500 MG tablet Take 1 tablet (500 mg total) by mouth 2 (two) times daily with a meal. 04/30/14 04/30/15  Aleksei Plotnikov V, MD  ondansetron (ZOFRAN) 4 MG tablet Take 1 tablet (4 mg total) by mouth every 6 (six)  hours as needed for nausea. 06/06/14   Georganna Skeans, MD  [facility] DELICA LANCETS 76A MISC 1 each by Does not apply route 2 (two) times daily as needed. 08/12/14   Aleksei Plotnikov V, MD  oxyCODONE-acetaminophen (PERCOCET/ROXICET) 5-325 MG per tablet Take 1-2 tablets by mouth every 4 (four) hours as needed for moderate pain. 06/06/14   Georganna Skeans, MD  traMADol (ULTRAM) 50 MG tablet Take 1 tablet (50 mg total) by mouth every 6 (six) hours as needed for moderate pain. 06/06/14   Georganna Skeans, MD  vitamin B-12 (CYANOCOBALAMIN) 1000 MCG tablet Take 1,000 mcg by mouth daily.      Historical Provider, MD   BP 133/83 mmHg  Pulse 87   Temp(Src) 98.7 F (37.1 C) (Oral)  Resp 16  SpO2 96% Physical Exam  Constitutional: She is oriented to person, place, and time. She appears well-developed and well-nourished. No distress.  HENT:  Mouth/Throat: Uvula is midline and oropharynx is clear and moist. No uvula swelling. No posterior oropharyngeal edema.    Neck: Normal range of motion. Neck supple.  Cardiovascular: Normal rate, regular rhythm and normal heart sounds.   Pulmonary/Chest: She has no wheezes.  Musculoskeletal: She exhibits no edema.  Neurological: She is alert and oriented to person, place, and time.  Skin: Skin is warm and dry.  Nursing note and vitals reviewed.   ED Course  Procedures (including critical care time) Labs Review Labs Reviewed - No data to display  Imaging Review No results found.   MDM   1. Angioedema of lips, initial encounter       Billy Fischer, MD 12/25/14 [phone]

## 2014-12-25 NOTE — Discharge Instructions (Signed)
No more benazapril, use new bp medicine, contact your doctor on mon.

## 2015-01-25 ENCOUNTER — Telehealth: Payer: Self-pay | Admitting: Pulmonary Disease

## 2015-01-25 DIAGNOSIS — G4733 Obstructive sleep apnea (adult) (pediatric): Secondary | ICD-10-CM

## 2015-01-25 NOTE — Telephone Encounter (Signed)
Called and spoke to pt. Pt is a former [facility] pt, last seen by [facility] on 6.5.15 and was told to f/u in 1 year. Pt is needing CPAP supplies. Pt has upcoming appt with RA on 9.22.16.   Dr. Elsworth Soho, please advise if ok to place order for CPAP supplies. Thanks.

## 2015-01-25 NOTE — Telephone Encounter (Signed)
lmtcb for pt. Will place order after speaking with pt.

## 2015-01-25 NOTE — Telephone Encounter (Signed)
Pt returned call [phone]

## 2015-01-25 NOTE — Telephone Encounter (Signed)
Okay to place order? °

## 2015-01-25 NOTE — Telephone Encounter (Signed)
LMTCB x 1 

## 2015-01-26 NOTE — Telephone Encounter (Signed)
[phone], pt cb

## 2015-01-26 NOTE — Telephone Encounter (Signed)
Spoke with pt, aware of recs.   Order placed. Nothing further needed at this time.

## 2015-02-11 ENCOUNTER — Ambulatory Visit (INDEPENDENT_AMBULATORY_CARE_PROVIDER_SITE_OTHER): Payer: BLUE CROSS/BLUE SHIELD | Admitting: Internal Medicine

## 2015-02-11 ENCOUNTER — Encounter: Payer: Self-pay | Admitting: Internal Medicine

## 2015-02-11 VITALS — BP 120/82 | HR 97 | Wt 237.0 lb

## 2015-02-11 DIAGNOSIS — R600 Localized edema: Secondary | ICD-10-CM | POA: Diagnosis not present

## 2015-02-11 DIAGNOSIS — R609 Edema, unspecified: Secondary | ICD-10-CM | POA: Insufficient documentation

## 2015-02-11 DIAGNOSIS — Z0000 Encounter for general adult medical examination without abnormal findings: Secondary | ICD-10-CM | POA: Diagnosis not present

## 2015-02-11 DIAGNOSIS — E119 Type 2 diabetes mellitus without complications: Secondary | ICD-10-CM | POA: Diagnosis not present

## 2015-02-11 DIAGNOSIS — E1169 Type 2 diabetes mellitus with other specified complication: Secondary | ICD-10-CM

## 2015-02-11 DIAGNOSIS — E669 Obesity, unspecified: Secondary | ICD-10-CM | POA: Diagnosis not present

## 2015-02-11 DIAGNOSIS — I10 Essential (primary) hypertension: Secondary | ICD-10-CM

## 2015-02-11 MED ORDER — TRIAMTERENE-HCTZ 37.5-25 MG PO TABS: 1.0000 | ORAL_TABLET | Freq: Every day | ORAL | Status: DC

## 2015-02-11 NOTE — Assessment & Plan Note (Signed)
2016 due to Norvasc 10 Will d/c Labs

## 2015-02-11 NOTE — Progress Notes (Signed)
Subjective:  Patient ID: Sierra Johnson, female    DOB: 1950-12-21  Age: 64 y.o. MRN: [id number]  CC: No chief complaint on file.   HPI Sierra Johnson presents for HTN and c/o leg swelling on Norvasc 10 mg a day. Benazepril was d/c'd after a lip swelling episode.  Outpatient Prescriptions Prior to Visit  Medication Sig Dispense Refill  . Cholecalciferol (VITAMIN D3) 1000 UNITS CAPS Take 1 capsule by mouth daily.     . fish oil-omega-3 fatty acids 1000 MG capsule Take 1 capsule by mouth 2 (two) times daily.      . fluticasone (FLONASE) 50 MCG/ACT nasal spray Place 2 sprays into the nose as needed for allergies.     [name] glucose blood (ONETOUCH VERIO) test strip Use twice daily as instructed 100 each 3  . metFORMIN (GLUCOPHAGE) 500 MG tablet Take 1 tablet (500 mg total) by mouth 2 (two) times daily with a meal. 180 tablet 3  . ONETOUCH DELICA LANCETS 45G MISC 1 each by Does not apply route 2 (two) times daily as needed. 100 each 3  . oxyCODONE-acetaminophen (PERCOCET/ROXICET) 5-325 MG per tablet Take 1-2 tablets by mouth every 4 (four) hours as needed for moderate pain. 50 tablet 0  . traMADol (ULTRAM) 50 MG tablet Take 1 tablet (50 mg total) by mouth every 6 (six) hours as needed for moderate pain. 40 tablet 0  . vitamin B-12 (CYANOCOBALAMIN) 1000 MCG tablet Take 1,000 mcg by mouth daily.      [name] amLODipine (NORVASC) 10 MG tablet Take 1 tablet (10 mg total) by mouth daily. 30 tablet 1  . benazepril (LOTENSIN) 10 MG tablet Take 1 tablet (10 mg total) by mouth daily. TAKE 1 tab BY MOUTH EVERY DAY AT BEDTIME 90 tablet 3  . ondansetron (ZOFRAN) 4 MG tablet Take 1 tablet (4 mg total) by mouth every 6 (six) hours as needed for nausea. (Patient not taking: Reported on 02/11/2015) 20 tablet 0   No facility-administered medications prior to visit.    ROS Review of Systems  Objective:  BP 120/82 mmHg  Pulse 97  Wt 237 lb (107.502 kg)  SpO2 99%  BP Readings from Last 3 Encounters:  02/11/15  120/82  12/25/14 133/83  08/13/14 118/76    Wt Readings from Last 3 Encounters:  02/11/15 237 lb (107.502 kg)  06/03/14 236 lb (107.049 kg)  05/27/14 236 lb 15.9 oz (107.5 kg)    Physical Exam 1+ B Lab Results  Component Value Date   WBC 14.0* 06/03/2014   HGB 12.6 06/03/2014   HCT 38.8 06/03/2014   PLT 280 06/03/2014   GLUCOSE 117* 05/27/2014   CHOL 202* 04/30/2014   TRIG 70.0 04/30/2014   HDL 56.90 04/30/2014   LDLDIRECT 119.5 08/27/2012   LDLCALC 131* 04/30/2014   ALT 22 04/30/2014   AST 19 04/30/2014   NA 135* 05/27/2014   K 4.5 05/27/2014   CL 100 05/27/2014   CREATININE 0.93 06/03/2014   BUN 14 05/27/2014   CO2 22 05/27/2014   TSH 1.39 04/30/2014   HGBA1C 7.1* 04/30/2014   MICROALBUR 16.1* 04/25/2013    No results found.  Assessment & Plan:   There are no diagnoses linked to this encounter. I have discontinued Ms. Huot's benazepril and amLODipine. I am also having her maintain her fish oil-omega-3 fatty acids, vitamin B-12, Vitamin D3, fluticasone, metFORMIN, ondansetron, oxyCODONE-acetaminophen, traMADol, glucose blood, and ONETOUCH DELICA LANCETS 25W.  No orders of the defined types were placed in  this encounter.     Follow-up: No Follow-up on file.  Walker Kehr, MD

## 2015-02-11 NOTE — Assessment & Plan Note (Signed)
D/c Norvasc Start Maxzide

## 2015-02-11 NOTE — Progress Notes (Signed)
Pre visit review using our clinic review tool, if applicable. No additional management support is needed unless otherwise documented below in the visit note. 

## 2015-02-11 NOTE — Assessment & Plan Note (Signed)
Labs

## 2015-03-10 ENCOUNTER — Other Ambulatory Visit: Payer: Self-pay | Admitting: *Deleted

## 2015-03-10 DIAGNOSIS — G4733 Obstructive sleep apnea (adult) (pediatric): Secondary | ICD-10-CM

## 2015-03-11 ENCOUNTER — Encounter: Payer: Self-pay | Admitting: Pulmonary Disease

## 2015-03-11 ENCOUNTER — Ambulatory Visit (INDEPENDENT_AMBULATORY_CARE_PROVIDER_SITE_OTHER): Payer: BLUE CROSS/BLUE SHIELD | Admitting: Pulmonary Disease

## 2015-03-11 VITALS — BP 132/88 | HR 90 | Ht 60.0 in | Wt 234.4 lb

## 2015-03-11 DIAGNOSIS — I10 Essential (primary) hypertension: Secondary | ICD-10-CM

## 2015-03-11 DIAGNOSIS — G4733 Obstructive sleep apnea (adult) (pediatric): Secondary | ICD-10-CM

## 2015-03-11 NOTE — Assessment & Plan Note (Signed)
Use diuretic daytime instead

## 2015-03-11 NOTE — Patient Instructions (Signed)
CPAP is working well at 12 cm New nasal mask CPAP supplies wil be renewed x 1 year

## 2015-03-11 NOTE — Assessment & Plan Note (Signed)
Ct 12 cm Renew supplies x 1 yr New nasal mask  Weight loss encouraged, compliance with goal of at least 4-6 hrs every night is the expectation. Advised against medications with sedative side effects Cautioned against driving when sleepy - understanding that sleepiness will vary on a day to day basis

## 2015-03-11 NOTE — Progress Notes (Signed)
   Subjective:    Patient ID: Sierra Johnson, female    DOB: 09-28-1950, 64 y.o.   MRN: [id number]  HPI  63/F for followup of OSA. She also has issues with sleep maintenance insomnia   Chief Complaint  Patient presents with  . Sleep Apnea    Former [facility] pt. pt states her mask hasnt been fitting well so she hasnt been sleeping good. pt using CPAP everynight for about 8 hours. DME: [facility]   Wakes up at night- using diuretic at bedtime Not sure if mask or pressure issue  On last visit recomm Pillows change to nasal bubble mask but she never made the change 11/2013 download >> 12 cm, no events 02/2015 download on 12 cm >> good usage, no residuals  Past Medical History  Diagnosis Date  . ANEMIA-NOS 04/24/2007  . HYPERLIPIDEMIA 07/17/2007    mild  . VITAMIN B12 DEFICIENCY 04/24/2007  . DIABETES MELLITUS, TYPE II 04/24/2007  . OBSTRUCTIVE SLEEP APNEA 09/30/2009    on CPAP 2011  . HYPERTENSION 04/24/2007  . Menopause 1998    Dr. Gaetano Net  . Elevated glucose 2011     Review of Systems neg for any significant sore throat, dysphagia, itching, sneezing, nasal congestion or excess/ purulent secretions, fever, chills, sweats, unintended wt loss, pleuritic or exertional cp, hempoptysis, orthopnea pnd or change in chronic leg swelling. Also denies presyncope, palpitations, heartburn, abdominal pain, nausea, vomiting, diarrhea or change in bowel or urinary habits, dysuria,hematuria, rash, arthralgias, visual complaints, headache, numbness weakness or ataxia.     Objective:   Physical Exam Gen. Pleasant, obese, in no distress ENT - no lesions, no post nasal drip Neck: No JVD, no thyromegaly, no carotid bruits Lungs: no use of accessory muscles, no dullness to percussion, decreased without rales or rhonchi  Cardiovascular: Rhythm regular, heart sounds  normal, no murmurs or gallops, no peripheral edema Musculoskeletal: No deformities, no cyanosis or clubbing , no tremors        Assessment &  Plan:

## 2015-04-13 ENCOUNTER — Ambulatory Visit (INDEPENDENT_AMBULATORY_CARE_PROVIDER_SITE_OTHER): Payer: BLUE CROSS/BLUE SHIELD | Admitting: Internal Medicine

## 2015-04-13 ENCOUNTER — Encounter: Payer: Self-pay | Admitting: Pulmonary Disease

## 2015-04-13 ENCOUNTER — Other Ambulatory Visit (INDEPENDENT_AMBULATORY_CARE_PROVIDER_SITE_OTHER): Payer: BLUE CROSS/BLUE SHIELD

## 2015-04-13 ENCOUNTER — Encounter: Payer: Self-pay | Admitting: Internal Medicine

## 2015-04-13 VITALS — BP 130/80 | HR 83 | Wt 225.0 lb

## 2015-04-13 DIAGNOSIS — E1169 Type 2 diabetes mellitus with other specified complication: Secondary | ICD-10-CM

## 2015-04-13 DIAGNOSIS — Z0000 Encounter for general adult medical examination without abnormal findings: Secondary | ICD-10-CM

## 2015-04-13 DIAGNOSIS — I10 Essential (primary) hypertension: Secondary | ICD-10-CM | POA: Diagnosis not present

## 2015-04-13 DIAGNOSIS — E538 Deficiency of other specified B group vitamins: Secondary | ICD-10-CM

## 2015-04-13 DIAGNOSIS — E119 Type 2 diabetes mellitus without complications: Secondary | ICD-10-CM

## 2015-04-13 DIAGNOSIS — G4733 Obstructive sleep apnea (adult) (pediatric): Secondary | ICD-10-CM

## 2015-04-13 DIAGNOSIS — R609 Edema, unspecified: Secondary | ICD-10-CM | POA: Diagnosis not present

## 2015-04-13 DIAGNOSIS — E539 Vitamin B deficiency, unspecified: Secondary | ICD-10-CM | POA: Diagnosis not present

## 2015-04-13 DIAGNOSIS — J019 Acute sinusitis, unspecified: Secondary | ICD-10-CM

## 2015-04-13 DIAGNOSIS — E669 Obesity, unspecified: Secondary | ICD-10-CM

## 2015-04-13 DIAGNOSIS — L309 Dermatitis, unspecified: Secondary | ICD-10-CM

## 2015-04-13 LAB — URINALYSIS, ROUTINE W REFLEX MICROSCOPIC
BILIRUBIN URINE: NEGATIVE
HGB URINE DIPSTICK: NEGATIVE
Ketones, ur: 40 — AB
LEUKOCYTES UA: NEGATIVE
NITRITE: NEGATIVE
RBC / HPF: NONE SEEN (ref 0–?)
Specific Gravity, Urine: 1.02 (ref 1.000–1.030)
TOTAL PROTEIN, URINE-UPE24: 100 — AB
Urobilinogen, UA: 0.2 (ref 0.0–1.0)
pH: 6 (ref 5.0–8.0)

## 2015-04-13 LAB — CBC WITH DIFFERENTIAL/PLATELET
BASOS ABS: 0 10*3/uL (ref 0.0–0.1)
Basophils Relative: 0.2 % (ref 0.0–3.0)
EOS PCT: 1.1 % (ref 0.0–5.0)
Eosinophils Absolute: 0.1 10*3/uL (ref 0.0–0.7)
HCT: 42.9 % (ref 36.0–46.0)
HEMOGLOBIN: 14.3 g/dL (ref 12.0–15.0)
LYMPHS ABS: 1.7 10*3/uL (ref 0.7–4.0)
Lymphocytes Relative: 17.9 % (ref 12.0–46.0)
MCHC: 33.4 g/dL (ref 30.0–36.0)
MCV: 83.4 fl (ref 78.0–100.0)
MONOS PCT: 6.4 % (ref 3.0–12.0)
Monocytes Absolute: 0.6 10*3/uL (ref 0.1–1.0)
NEUTROS PCT: 74.4 % (ref 43.0–77.0)
Neutro Abs: 7.2 10*3/uL (ref 1.4–7.7)
Platelets: 328 10*3/uL (ref 150.0–400.0)
RBC: 5.14 Mil/uL — AB (ref 3.87–5.11)
RDW: 14.6 % (ref 11.5–15.5)
WBC: 9.7 10*3/uL (ref 4.0–10.5)

## 2015-04-13 LAB — BASIC METABOLIC PANEL
BUN: 16 mg/dL (ref 6–23)
CHLORIDE: 91 meq/L — AB (ref 96–112)
CO2: 29 mEq/L (ref 19–32)
Calcium: 10.3 mg/dL (ref 8.4–10.5)
Creatinine, Ser: 1.14 mg/dL (ref 0.40–1.20)
GFR: 62.14 mL/min (ref >60.00)
Glucose, Bld: 438 mg/dL — ABNORMAL HIGH (ref 70–99)
POTASSIUM: 4.7 meq/L (ref 3.5–5.1)
SODIUM: 132 meq/L — AB (ref 135–145)

## 2015-04-13 LAB — LIPID PANEL
CHOL/HDL RATIO: 4
Cholesterol: 243 mg/dL — ABNORMAL HIGH (ref 0–200)
HDL: 68.3 mg/dL (ref >39.00)
LDL Cholesterol: 149 mg/dL — ABNORMAL HIGH (ref 0–99)
NONHDL: 175.02
Triglycerides: 131 mg/dL (ref 0.0–149.0)
VLDL: 26.2 mg/dL (ref 0.0–40.0)

## 2015-04-13 LAB — TSH: TSH: 1.11 u[IU]/mL (ref 0.35–4.50)

## 2015-04-13 LAB — HEMOGLOBIN A1C: Hgb A1c MFr Bld: 12 % — ABNORMAL HIGH (ref 4.6–6.5)

## 2015-04-13 MED ORDER — LEVOFLOXACIN 500 MG PO TABS: 500.0000 mg | ORAL_TABLET | Freq: Every day | ORAL | Status: DC

## 2015-04-13 MED ORDER — METFORMIN HCL 500 MG PO TABS: 500.0000 mg | ORAL_TABLET | Freq: Two times a day (BID) | ORAL | Status: DC

## 2015-04-13 MED ORDER — TRIAMCINOLONE ACETONIDE 0.1 % EX OINT: 1.0000 "application " | TOPICAL_OINTMENT | Freq: Two times a day (BID) | CUTANEOUS | Status: DC

## 2015-04-13 NOTE — Assessment & Plan Note (Signed)
Decrease Maxzide to 1/2 tab a day

## 2015-04-13 NOTE — Assessment & Plan Note (Signed)
resolved 

## 2015-04-13 NOTE — Progress Notes (Signed)
Subjective:  Patient ID: Sierra Johnson, female    DOB: Sep 25, 1950  Age: 64 y.o. MRN: [id number]  CC: No chief complaint on file.   HPI Sierra Johnson presents a well exam. C/o URI sx's x 1 week. C/o fatigue. C/o yellow nasal d/c  Outpatient Prescriptions Prior to Visit  Medication Sig Dispense Refill  . Cholecalciferol (VITAMIN D3) 1000 UNITS CAPS Take 1 capsule by mouth daily.     . fish oil-omega-3 fatty acids 1000 MG capsule Take 1 capsule by mouth 2 (two) times daily.      . fluticasone (FLONASE) 50 MCG/ACT nasal spray Place 2 sprays into the nose as needed for allergies.     [name] glucose blood (ONETOUCH VERIO) test strip Use twice daily as instructed 100 each 3  . metFORMIN (GLUCOPHAGE) 500 MG tablet Take 1 tablet (500 mg total) by mouth 2 (two) times daily with a meal. 180 tablet 3  . ONETOUCH DELICA LANCETS 17E MISC 1 each by Does not apply route 2 (two) times daily as needed. 100 each 3  . triamterene-hydrochlorothiazide (MAXZIDE-25) 37.5-25 MG per tablet Take 1 tablet by mouth daily. 30 tablet 11  . vitamin B-12 (CYANOCOBALAMIN) 1000 MCG tablet Take 1,000 mcg by mouth daily.       No facility-administered medications prior to visit.    ROS Review of Systems  Constitutional: Positive for fatigue. Negative for chills, activity change, appetite change and unexpected weight change.  HENT: Positive for postnasal drip, rhinorrhea and sinus pressure. Negative for congestion and mouth sores.   Eyes: Negative for visual disturbance.  Respiratory: Negative for cough and chest tightness.   Gastrointestinal: Negative for nausea and abdominal pain.  Genitourinary: Negative for frequency, difficulty urinating and vaginal pain.  Musculoskeletal: Negative for back pain and gait problem.  Skin: Negative for pallor and rash.  Neurological: Negative for dizziness, tremors, weakness, numbness and headaches.  Psychiatric/Behavioral: Negative for confusion and sleep disturbance.     Objective:  BP 130/80 mmHg  Pulse 83  Wt 225 lb (102.059 kg)  SpO2 96%  BP Readings from Last 3 Encounters:  04/13/15 130/80  03/11/15 132/88  02/11/15 120/82    Wt Readings from Last 3 Encounters:  04/13/15 225 lb (102.059 kg)  03/11/15 234 lb 6.4 oz (106.323 kg)  02/11/15 237 lb (107.502 kg)    Physical Exam  Constitutional: She appears well-developed. No distress.  HENT:  Head: Normocephalic.  Right Ear: External ear normal.  Left Ear: External ear normal.  Nose: Nose normal.  Mouth/Throat: Oropharynx is clear and moist.  Eyes: Conjunctivae are normal. Pupils are equal, round, and reactive to light. Right eye exhibits no discharge. Left eye exhibits no discharge.  Neck: Normal range of motion. Neck supple. No JVD present. No tracheal deviation present. No thyromegaly present.  Cardiovascular: Normal rate, regular rhythm and normal heart sounds.   Pulmonary/Chest: No stridor. No respiratory distress. She has no wheezes.  Abdominal: Soft. Bowel sounds are normal. She exhibits no distension and no mass. There is no tenderness. There is no rebound and no guarding.  Musculoskeletal: She exhibits no edema or tenderness.  Lymphadenopathy:    She has no cervical adenopathy.  Neurological: She displays normal reflexes. No cranial nerve deficit. She exhibits normal muscle tone. Coordination normal.  Skin: No rash noted. No erythema.  Psychiatric: She has a normal mood and affect. Her behavior is normal. Judgment and thought content normal.  R foot - patchy rash R eryth nasal mucosa  Lab  Results  Component Value Date   WBC 14.0* 06/03/2014   HGB 12.6 06/03/2014   HCT 38.8 06/03/2014   PLT 280 06/03/2014   GLUCOSE 117* 05/27/2014   CHOL 202* 04/30/2014   TRIG 70.0 04/30/2014   HDL 56.90 04/30/2014   LDLDIRECT 119.5 08/27/2012   LDLCALC 131* 04/30/2014   ALT 22 04/30/2014   AST 19 04/30/2014   NA 135* 05/27/2014   K 4.5 05/27/2014   CL 100 05/27/2014   CREATININE  0.93 06/03/2014   BUN 14 05/27/2014   CO2 22 05/27/2014   TSH 1.39 04/30/2014   HGBA1C 7.1* 04/30/2014   MICROALBUR 16.1* 04/25/2013    No results found.  Assessment & Plan:   There are no diagnoses linked to this encounter. I am having Ms. Bankert maintain her fish oil-omega-3 fatty acids, vitamin B-12, Vitamin D3, fluticasone, metFORMIN, glucose blood, ONETOUCH DELICA LANCETS 32P, and triamterene-hydrochlorothiazide.  No orders of the defined types were placed in this encounter.     Follow-up: No Follow-up on file.  Walker Kehr, MD

## 2015-04-13 NOTE — Assessment & Plan Note (Signed)
Labs

## 2015-04-13 NOTE — Assessment & Plan Note (Signed)
We discussed age appropriate health related issues, including available/recomended screening tests and vaccinations. We discussed a need for adhering to healthy diet and exercise. Labs/EKG were reviewed/ordered. All questions were answered.   

## 2015-04-13 NOTE — Assessment & Plan Note (Signed)
Levaquin x 10 d 

## 2015-04-13 NOTE — Progress Notes (Signed)
Pre visit review using our clinic review tool, if applicable. No additional management support is needed unless otherwise documented below in the visit note. 

## 2015-04-13 NOTE — Assessment & Plan Note (Signed)
On Rx Labs 

## 2015-04-13 NOTE — Assessment & Plan Note (Signed)
R foot - patchy rash Kenalog prn

## 2015-04-16 ENCOUNTER — Telehealth: Payer: Self-pay | Admitting: Internal Medicine

## 2015-04-16 NOTE — Telephone Encounter (Signed)
Patient is returning your phone call. 

## 2015-04-19 NOTE — Telephone Encounter (Signed)
Pt informed of below. OV scheduled.    Synthia Fairbank - pls have the pt see me on Thur at 1 pm Labs/tests are ok, except for a very high sugars. Please take Metformin 500 mg 3 times a day and see me on Thursday: we will need to start you on Insulin shots.  Thx

## 2015-04-21 ENCOUNTER — Ambulatory Visit (INDEPENDENT_AMBULATORY_CARE_PROVIDER_SITE_OTHER): Payer: BLUE CROSS/BLUE SHIELD | Admitting: Internal Medicine

## 2015-04-21 ENCOUNTER — Encounter: Payer: Self-pay | Admitting: Internal Medicine

## 2015-04-21 VITALS — BP 130/86 | HR 84 | Wt 225.0 lb

## 2015-04-21 DIAGNOSIS — E1165 Type 2 diabetes mellitus with hyperglycemia: Secondary | ICD-10-CM | POA: Diagnosis not present

## 2015-04-21 DIAGNOSIS — IMO0001 Reserved for inherently not codable concepts without codable children: Secondary | ICD-10-CM

## 2015-04-21 DIAGNOSIS — I10 Essential (primary) hypertension: Secondary | ICD-10-CM

## 2015-04-21 DIAGNOSIS — E119 Type 2 diabetes mellitus without complications: Secondary | ICD-10-CM | POA: Diagnosis not present

## 2015-04-21 DIAGNOSIS — E669 Obesity, unspecified: Secondary | ICD-10-CM

## 2015-04-21 DIAGNOSIS — E1169 Type 2 diabetes mellitus with other specified complication: Secondary | ICD-10-CM

## 2015-04-21 MED ORDER — GLUCOSE BLOOD VI STRP: ORAL_STRIP | Status: DC

## 2015-04-21 MED ORDER — INSULIN GLARGINE 300 UNIT/ML ~~LOC~~ SOPN: 18.0000 [IU] | PEN_INJECTOR | Freq: Every morning | SUBCUTANEOUS | Status: DC

## 2015-04-21 MED ORDER — INSULIN PEN NEEDLE 29G X 12.7MM MISC: Status: DC

## 2015-04-21 MED ORDER — METFORMIN HCL 500 MG PO TABS: 500.0000 mg | ORAL_TABLET | Freq: Three times a day (TID) | ORAL | Status: DC

## 2015-04-21 MED ORDER — ONETOUCH DELICA LANCETS 33G MISC: 1.0000 | Freq: Two times a day (BID) | Status: DC | PRN

## 2015-04-21 NOTE — Patient Instructions (Signed)
Insulin Glargine injection What is this medicine? INSULIN GLARGINE (IN su lin GLAR geen) is a human-made form of insulin. This drug lowers the amount of sugar in your blood. It is a long-acting insulin that is usually given once a day. This medicine may be used for other purposes; ask your health care provider or pharmacist if you have questions. What should I tell my health care provider before I take this medicine? They need to know if you have any of these conditions: -episodes of hypoglycemia -kidney disease -liver disease -an unusual or allergic reaction to insulin, metacresol, other medicines, foods, dyes, or preservatives -pregnant or trying to get pregnant -breast-feeding How should I use this medicine? This medicine is for injection under the skin. Use this medicine at the same time each day. Use exactly as directed. This insulin should never be mixed in the same syringe with other insulins before injection. Do not vigorously shake before use. You will be taught how to use this medicine and how to adjust doses for activities and illness. Do not use more insulin than prescribed. Always check the appearance of your insulin before using it. This medicine should be clear and colorless like water. Do not use it if it is cloudy, thickened, colored, or has solid particles in it. It is important that you put your used needles and syringes in a special sharps container. Do not put them in a trash can. If you do not have a sharps container, call your pharmacist or healthcare provider to get one. Talk to your pediatrician regarding the use of this medicine in children. Special care may be needed. Overdosage: If you think you have taken too much of this medicine contact a poison control center or emergency room at once. NOTE: This medicine is only for you. Do not share this medicine with others. What if I miss a dose? It is important not to miss a dose. Your health care professional or doctor should  discuss a plan for missed doses with you. If you do miss a dose, follow their plan. Do not take double doses. What may interact with this medicine? -other medicines for diabetes Many medications may cause an increase or decrease in blood sugar, these include: -alcohol containing beverages -aspirin and aspirin-like drugs -chloramphenicol -chromium -diuretics -female hormones, like estrogens or progestins and birth control pills -heart medicines -isoniazid -female hormones or anabolic steroids -medicines for weight loss -medicines for allergies, asthma, cold, or cough -medicines for mental problems -medicines called MAO Inhibitors like Nardil, Parnate, Marplan, Eldepryl -niacin -NSAIDs, medicines for pain and inflammation, like ibuprofen or naproxen -pentamidine -phenytoin -probenecid -quinolone antibiotics like ciprofloxacin, levofloxacin, ofloxacin -some herbal dietary supplements -steroid medicines like prednisone or cortisone -thyroid medicine Some medications can hide the warning symptoms of low blood sugar. You may need to monitor your blood sugar more closely if you are taking one of these medications. These include: -beta-blockers such as atenolol, metoprolol, propranolol -clonidine -guanethidine -reserpine This list may not describe all possible interactions. Give your health care provider a list of all the medicines, herbs, non-prescription drugs, or dietary supplements you use. Also tell them if you smoke, drink alcohol, or use illegal drugs. Some items may interact with your medicine. What should I watch for while using this medicine? Visit your health care professional or doctor for regular checks on your progress. Do not drive, use machinery, or do anything that needs mental alertness until you know how this medicine affects you. Alcohol may interfere with the effect   of this medicine. Avoid alcoholic drinks. A test called the HbA1C (A1C) will be monitored. This is a  simple blood test. It measures your blood sugar control over the last 2 to 3 months. You will receive this test every 3 to 6 months. Learn how to check your blood sugar. Learn the symptoms of low and high blood sugar and how to manage them. Always carry a quick-source of sugar with you in case you have symptoms of low blood sugar. Examples include hard sugar candy or glucose tablets. Make sure others know that you can choke if you eat or drink when you develop serious symptoms of low blood sugar, such as seizures or unconsciousness. They must get medical help at once. Tell your doctor or health care professional if you have high blood sugar. You might need to change the dose of your medicine. If you are sick or exercising more than usual, you might need to change the dose of your medicine. Do not skip meals. Ask your doctor or health care professional if you should avoid alcohol. Many nonprescription cough and cold products contain sugar or alcohol. These can affect blood sugar. Make sure that you have the right kind of syringe for the type of insulin you use. Try not to change the brand and type of insulin or syringe unless your health care professional or doctor tells you to. Switching insulin brand or type can cause dangerously high or low blood sugar. Always keep an extra supply of insulin, syringes, and needles on hand. Use a syringe one time only. Throw away syringe and needle in a closed container to prevent accidental needle sticks. Insulin pens and cartridges should never be shared. Even if the needle is changed, sharing may result in passing of viruses like hepatitis or HIV. Wear a medical ID bracelet or chain, and carry a card that describes your disease and details of your medicine and dosage times. What side effects may I notice from receiving this medicine? Side effects that you should report to your health care professional or doctor as soon as possible: -allergic reactions like skin rash,  itching or hives, swelling of the face, lips, or tongue -breathing problems -signs and symptoms of high blood sugar such as dizziness, dry mouth, dry skin, fruity breath, nausea, stomach pain, increased hunger or thirst, increased urination -signs and symptoms of low blood sugar such as feeling anxious, confusion, dizziness, increased hunger, unusually weak or tired, sweating, shakiness, cold, irritable, headache, blurred vision, fast heartbeat, loss of consciousness Side effects that usually do not require medical attention (report to your health care professional or doctor if they continue or are bothersome): -increase or decrease in fatty tissue under the skin due to overuse of a particular injection site -itching, burning, swelling, or rash at site where injected This list may not describe all possible side effects. Call your doctor for medical advice about side effects. You may report side effects to FDA at 1-800-FDA-1088. Where should I keep my medicine? Keep out of the reach of children. Store unopened vials in a refrigerator between 2 and 8 degrees C (36 and 46 degrees F). Do not freeze or use if the insulin has been frozen. Opened vials (vials currently in use) may be stored in the refrigerator or at room temperature, at approximately 25 degrees C (77 degrees F) or cooler. Keeping your insulin at room temperature decreases the amount of pain during injection. Once opened, your insulin can be used for 28 days. After 28 days,   the vial should be thrown away. Store Lantus Solostar Pens or Basaglar KwikPens in a refrigerator between 2 and 8 degrees C (36 and 46 degrees F) or at room temperature below 30 degrees C (86 degrees F). Do not freeze or use if the insulin has been frozen. Once opened, the pens should be kept at room temperature. Do not store in the refrigerator once opened. Once opened, the insulin can be used for 28 days. After 28 days, the Lantus Solostar Pen or Basaglar KwikPen should be  thrown away. Store Toujeo Solostar Pens in a refrigerator between 2 and 8 degrees C (36 and 46 degrees F). Do not freeze or use if the insulin has been frozen. Once opened, the pens should be kept at room temperature below 30 degrees C (86 degrees F). Do not store in the refrigerator once opened. Once opened, the insulin can be used for 42 days. After 42 days, the Toujeo Solostar Pen should be thrown away. Protect all insulin vials and pens from light and excessive heat. Throw away any unused medicine after the expiration date or after the specified time for room temperature storage has passed. NOTE: This sheet is a summary. It may not cover all possible information. If you have questions about this medicine, talk to your doctor, pharmacist, or health care provider.    2016, [employer]. (2014-06-15 10:12:42)  

## 2015-04-21 NOTE — Progress Notes (Signed)
Pre visit review using our clinic review tool, if applicable. No additional management support is needed unless otherwise documented below in the visit note. 

## 2015-04-21 NOTE — Progress Notes (Signed)
Subjective:  Patient ID: Sierra Johnson, female    DOB: [birth year]  Age: 64 y.o. MRN: [id number]  CC: No chief complaint on file.   HPI Sierra Johnson presents for uncontrolled DM and sinusitis - better. Sinusitis sx's are better  Outpatient Prescriptions Prior to Visit  Medication Sig Dispense Refill  . Cholecalciferol (VITAMIN D3) 1000 UNITS CAPS Take 1 capsule by mouth daily.     . fish oil-omega-3 fatty acids 1000 MG capsule Take 1 capsule by mouth 2 (two) times daily.      . fluticasone (FLONASE) 50 MCG/ACT nasal spray Place 2 sprays into the nose as needed for allergies.     [name] levofloxacin (LEVAQUIN) 500 MG tablet Take 1 tablet (500 mg total) by mouth daily. 10 tablet 0  . triamcinolone ointment (KENALOG) 0.1 % Apply 1 application topically 2 (two) times daily. 30 g 1  . triamterene-hydrochlorothiazide (MAXZIDE-25) 37.5-25 MG per tablet Take 1 tablet by mouth daily. 30 tablet 11  . vitamin B-12 (CYANOCOBALAMIN) 1000 MCG tablet Take 1,000 mcg by mouth daily.      [name] glucose blood (ONETOUCH VERIO) test strip Use twice daily as instructed 100 each 3  . metFORMIN (GLUCOPHAGE) 500 MG tablet Take 1 tablet (500 mg total) by mouth 2 (two) times daily with a meal. 180 tablet 3  . ONETOUCH DELICA LANCETS 58I MISC 1 each by Does not apply route 2 (two) times daily as needed. 100 each 3   No facility-administered medications prior to visit.    ROS Review of Systems  Constitutional: Positive for fatigue. Negative for chills, activity change, appetite change and unexpected weight change.  HENT: Negative for congestion, mouth sores and sinus pressure.   Eyes: Negative for visual disturbance.  Respiratory: Negative for cough and chest tightness.   Gastrointestinal: Negative for nausea and abdominal pain.  Genitourinary: Negative for frequency, difficulty urinating and vaginal pain.  Musculoskeletal: Negative for back pain and gait problem.  Skin: Negative for pallor and rash.    Neurological: Negative for dizziness, tremors, weakness, numbness and headaches.  Psychiatric/Behavioral: Negative for confusion and sleep disturbance.    Objective:  BP 130/86 mmHg  Pulse 84  Wt 225 lb (102.059 kg)  SpO2 98%  BP Readings from Last 3 Encounters:  04/21/15 130/86  04/13/15 130/80  03/11/15 132/88    Wt Readings from Last 3 Encounters:  04/21/15 225 lb (102.059 kg)  04/13/15 225 lb (102.059 kg)  03/11/15 234 lb 6.4 oz (106.323 kg)    Physical Exam  Constitutional: She appears well-developed. No distress.  HENT:  Head: Normocephalic.  Right Ear: External ear normal.  Left Ear: External ear normal.  Nose: Nose normal.  Mouth/Throat: Oropharynx is clear and moist.  Eyes: Conjunctivae are normal. Pupils are equal, round, and reactive to light. Right eye exhibits no discharge. Left eye exhibits no discharge.  Neck: Normal range of motion. Neck supple. No JVD present. No tracheal deviation present. No thyromegaly present.  Cardiovascular: Normal rate, regular rhythm and normal heart sounds.   Pulmonary/Chest: No stridor. No respiratory distress. She has no wheezes.  Abdominal: Soft. Bowel sounds are normal. She exhibits no distension and no mass. There is no tenderness. There is no rebound and no guarding.  Musculoskeletal: She exhibits no edema or tenderness.  Lymphadenopathy:    She has no cervical adenopathy.  Neurological: She displays normal reflexes. No cranial nerve deficit. She exhibits normal muscle tone. Coordination normal.  Skin: No rash noted. No erythema.  Psychiatric:  She has a normal mood and affect. Her behavior is normal. Judgment and thought content normal.  obese   Lab Results  Component Value Date   WBC 9.7 04/13/2015   HGB 14.3 04/13/2015   HCT 42.9 04/13/2015   PLT 328.0 04/13/2015   GLUCOSE 438* 04/13/2015   CHOL 243* 04/13/2015   TRIG 131.0 04/13/2015   HDL 68.30 04/13/2015   LDLDIRECT 119.5 08/27/2012   LDLCALC 149* 04/13/2015    ALT 22 04/30/2014   AST 19 04/30/2014   NA 132* 04/13/2015   K 4.7 04/13/2015   CL 91* 04/13/2015   CREATININE 1.14 04/13/2015   BUN 16 04/13/2015   CO2 29 04/13/2015   TSH 1.11 04/13/2015   HGBA1C 12.0* 04/13/2015   MICROALBUR 16.1* 04/25/2013    No results found.  Assessment & Plan:   Diagnoses and all orders for this visit:  Uncontrolled diabetes mellitus type 2 without complications, unspecified long term insulin use status ([place]) -     Amb Referral to Nutrition and Diabetic E  Other orders -     Discontinue: metFORMIN (GLUCOPHAGE) 500 MG tablet; Take 1 tablet (500 mg total) by mouth 3 (three) times daily. -     Discontinue: Insulin Glargine (TOUJEO SOLOSTAR) 300 UNIT/ML SOPN; Inject 18 Units into the skin every morning. -     Discontinue: glucose blood (ONETOUCH VERIO) test strip; Use twice daily as instructed -     ONETOUCH DELICA LANCETS 29N MISC; 1 each by Does not apply route 2 (two) times daily as needed. -     Discontinue: Insulin Pen Needle (BD ULTRA-FINE PEN NEEDLES) 29G X 12.7MM MISC; Use qd -     Insulin Pen Needle (BD ULTRA-FINE PEN NEEDLES) 29G X 12.7MM MISC; Use qd -     Insulin Glargine (TOUJEO SOLOSTAR) 300 UNIT/ML SOPN; Inject 18 Units into the skin every morning. -     glucose blood (ONETOUCH VERIO) test strip; Use twice daily as instructed -     metFORMIN (GLUCOPHAGE) 500 MG tablet; Take 1 tablet (500 mg total) by mouth 3 (three) times daily.   I am having Sierra Johnson maintain her fish oil-omega-3 fatty acids, vitamin B-12, Vitamin D3, fluticasone, triamterene-hydrochlorothiazide, levofloxacin, triamcinolone ointment, ONETOUCH DELICA LANCETS 98X, Insulin Pen Needle, Insulin Glargine, glucose blood, and metFORMIN.  Meds ordered this encounter  Medications  . DISCONTD: metFORMIN (GLUCOPHAGE) 500 MG tablet    Sig: Take 1 tablet (500 mg total) by mouth 3 (three) times daily.    Dispense:  270 tablet    Refill:  3  . DISCONTD: Insulin Glargine (TOUJEO  SOLOSTAR) 300 UNIT/ML SOPN    Sig: Inject 18 Units into the skin every morning.    Dispense:  3 pen    Refill:  5  . DISCONTD: glucose blood (ONETOUCH VERIO) test strip    Sig: Use twice daily as instructed    Dispense:  100 each    Refill:  3  . ONETOUCH DELICA LANCETS 21J MISC    Sig: 1 each by Does not apply route 2 (two) times daily as needed.    Dispense:  100 each    Refill:  3  . DISCONTD: Insulin Pen Needle (BD ULTRA-FINE PEN NEEDLES) 29G X 12.7MM MISC    Sig: Use qd    Dispense:  100 each    Refill:  3  . Insulin Pen Needle (BD ULTRA-FINE PEN NEEDLES) 29G X 12.7MM MISC    Sig: Use qd    Dispense:  100 each    Refill:  3  . Insulin Glargine (TOUJEO SOLOSTAR) 300 UNIT/ML SOPN    Sig: Inject 18 Units into the skin every morning.    Dispense:  3 pen    Refill:  5  . glucose blood (ONETOUCH VERIO) test strip    Sig: Use twice daily as instructed    Dispense:  100 each    Refill:  3  . metFORMIN (GLUCOPHAGE) 500 MG tablet    Sig: Take 1 tablet (500 mg total) by mouth 3 (three) times daily.    Dispense:  270 tablet    Refill:  3     Follow-up: Return in about 6 weeks (around 06/02/2015) for a follow-up visit.  Walker Kehr, MD

## 2015-04-21 NOTE — Assessment & Plan Note (Signed)
Discussed diet  

## 2015-04-21 NOTE — Assessment & Plan Note (Signed)
Chronic  Decrease Maxzide to 1/2 tab a day

## 2015-04-21 NOTE — Assessment & Plan Note (Signed)
Cont Metformin Options discussed 04/2015 - added Toujeo 0.2 u/kg/d Diab teaching ref

## 2015-04-22 ENCOUNTER — Telehealth: Payer: Self-pay | Admitting: Internal Medicine

## 2015-04-22 NOTE — Telephone Encounter (Signed)
If she got her meds Nelva Nay) - can Carita Pian. here for a [employer] and DM talk tomorrow? I could see her on Mon Thx

## 2015-04-22 NOTE — Telephone Encounter (Signed)
Pt states Dr. Camila Li wanted her to get an appointment with a clinic to show her how to do the insulin injections. They can't get her in till 12/2.  She is concerned about this. She can be reached at [phone]

## 2015-04-23 NOTE — Telephone Encounter (Signed)
Pt called back- She can't come early in the day. She requests to come in at 4:30 on Monday.

## 2015-04-23 NOTE — Telephone Encounter (Signed)
Left mess for patient to call back.  

## 2015-04-25 NOTE — Telephone Encounter (Signed)
Ok Thx 

## 2015-04-26 ENCOUNTER — Ambulatory Visit (INDEPENDENT_AMBULATORY_CARE_PROVIDER_SITE_OTHER): Payer: BLUE CROSS/BLUE SHIELD | Admitting: Internal Medicine

## 2015-04-26 ENCOUNTER — Encounter: Payer: Self-pay | Admitting: Internal Medicine

## 2015-04-26 VITALS — BP 114/72 | HR 80

## 2015-04-26 DIAGNOSIS — E1169 Type 2 diabetes mellitus with other specified complication: Secondary | ICD-10-CM

## 2015-04-26 DIAGNOSIS — E119 Type 2 diabetes mellitus without complications: Secondary | ICD-10-CM | POA: Diagnosis not present

## 2015-04-26 DIAGNOSIS — E669 Obesity, unspecified: Secondary | ICD-10-CM | POA: Diagnosis not present

## 2015-04-26 NOTE — Assessment & Plan Note (Signed)
Toujeo pen use was discussed

## 2015-04-26 NOTE — Progress Notes (Signed)
   Subjective:  Patient ID: Sierra Johnson, female    DOB: 1950-09-28  Age: 64 y.o. MRN: [id number]  CC: No chief complaint on file.   HPI Sierra Johnson presents for DM, Toujeo pe use. Pt administered inj this am.  Outpatient Prescriptions Prior to Visit  Medication Sig Dispense Refill  . Cholecalciferol (VITAMIN D3) 1000 UNITS CAPS Take 1 capsule by mouth daily.     . fish oil-omega-3 fatty acids 1000 MG capsule Take 1 capsule by mouth 2 (two) times daily.      . fluticasone (FLONASE) 50 MCG/ACT nasal spray Place 2 sprays into the nose as needed for allergies.     [name] glucose blood (ONETOUCH VERIO) test strip Use twice daily as instructed 100 each 3  . Insulin Glargine (TOUJEO SOLOSTAR) 300 UNIT/ML SOPN Inject 18 Units into the skin every morning. 3 pen 5  . Insulin Pen Needle (BD ULTRA-FINE PEN NEEDLES) 29G X 12.7MM MISC Use qd 100 each 3  . levofloxacin (LEVAQUIN) 500 MG tablet Take 1 tablet (500 mg total) by mouth daily. 10 tablet 0  . metFORMIN (GLUCOPHAGE) 500 MG tablet Take 1 tablet (500 mg total) by mouth 3 (three) times daily. 270 tablet 3  . ONETOUCH DELICA LANCETS 35T MISC 1 each by Does not apply route 2 (two) times daily as needed. 100 each 3  . triamcinolone ointment (KENALOG) 0.1 % Apply 1 application topically 2 (two) times daily. 30 g 1  . triamterene-hydrochlorothiazide (MAXZIDE-25) 37.5-25 MG per tablet Take 1 tablet by mouth daily. 30 tablet 11  . vitamin B-12 (CYANOCOBALAMIN) 1000 MCG tablet Take 1,000 mcg by mouth daily.       No facility-administered medications prior to visit.    ROS Review of Systems  Objective:  BP 114/72 mmHg  Pulse 80  SpO2 95%  BP Readings from Last 3 Encounters:  04/26/15 114/72  04/21/15 130/86  04/13/15 130/80    Wt Readings from Last 3 Encounters:  04/21/15 225 lb (102.059 kg)  04/13/15 225 lb (102.059 kg)  03/11/15 234 lb 6.4 oz (106.323 kg)    Physical Exam  Lab Results  Component Value Date   WBC 9.7 04/13/2015   HGB 14.3 04/13/2015   HCT 42.9 04/13/2015   PLT 328.0 04/13/2015   GLUCOSE 438* 04/13/2015   CHOL 243* 04/13/2015   TRIG 131.0 04/13/2015   HDL 68.30 04/13/2015   LDLDIRECT 119.5 08/27/2012   LDLCALC 149* 04/13/2015   ALT 22 04/30/2014   AST 19 04/30/2014   NA 132* 04/13/2015   K 4.7 04/13/2015   CL 91* 04/13/2015   CREATININE 1.14 04/13/2015   BUN 16 04/13/2015   CO2 29 04/13/2015   TSH 1.11 04/13/2015   HGBA1C 12.0* 04/13/2015   MICROALBUR 16.1* 04/25/2013    No results found.  Assessment & Plan:   There are no diagnoses linked to this encounter. I am having [name] maintain her fish oil-omega-3 fatty acids, vitamin B-12, Vitamin D3, fluticasone, triamterene-hydrochlorothiazide, levofloxacin, triamcinolone ointment, ONETOUCH DELICA LANCETS 73U, Insulin Pen Needle, Insulin Glargine, glucose blood, and metFORMIN.  No orders of the defined types were placed in this encounter.     Follow-up: No Follow-up on file.  Walker Kehr, MD

## 2015-04-26 NOTE — Progress Notes (Signed)
Pre visit review using our clinic review tool, if applicable. No additional management support is needed unless otherwise documented below in the visit note. 

## 2015-05-11 ENCOUNTER — Encounter: Payer: BLUE CROSS/BLUE SHIELD | Attending: Internal Medicine

## 2015-05-11 VITALS — Ht 60.0 in | Wt 223.0 lb

## 2015-05-11 DIAGNOSIS — E1169 Type 2 diabetes mellitus with other specified complication: Secondary | ICD-10-CM

## 2015-05-11 DIAGNOSIS — E119 Type 2 diabetes mellitus without complications: Secondary | ICD-10-CM | POA: Insufficient documentation

## 2015-05-11 DIAGNOSIS — Z6841 Body Mass Index (BMI) 40.0 and over, adult: Secondary | ICD-10-CM | POA: Insufficient documentation

## 2015-05-11 DIAGNOSIS — Z794 Long term (current) use of insulin: Secondary | ICD-10-CM | POA: Insufficient documentation

## 2015-05-11 DIAGNOSIS — E669 Obesity, unspecified: Secondary | ICD-10-CM | POA: Insufficient documentation

## 2015-05-11 DIAGNOSIS — Z713 Dietary counseling and surveillance: Secondary | ICD-10-CM | POA: Insufficient documentation

## 2015-05-11 NOTE — Progress Notes (Signed)

## 2015-05-18 DIAGNOSIS — E119 Type 2 diabetes mellitus without complications: Secondary | ICD-10-CM | POA: Diagnosis not present

## 2015-05-18 DIAGNOSIS — E1169 Type 2 diabetes mellitus with other specified complication: Secondary | ICD-10-CM

## 2015-05-18 DIAGNOSIS — E669 Obesity, unspecified: Principal | ICD-10-CM

## 2015-05-18 NOTE — Progress Notes (Signed)

## 2015-05-25 ENCOUNTER — Encounter: Payer: BLUE CROSS/BLUE SHIELD | Attending: Internal Medicine

## 2015-05-25 DIAGNOSIS — Z794 Long term (current) use of insulin: Secondary | ICD-10-CM | POA: Diagnosis not present

## 2015-05-25 DIAGNOSIS — Z6841 Body Mass Index (BMI) 40.0 and over, adult: Secondary | ICD-10-CM | POA: Insufficient documentation

## 2015-05-25 DIAGNOSIS — Z713 Dietary counseling and surveillance: Secondary | ICD-10-CM | POA: Insufficient documentation

## 2015-05-25 DIAGNOSIS — E119 Type 2 diabetes mellitus without complications: Secondary | ICD-10-CM | POA: Diagnosis not present

## 2015-05-25 DIAGNOSIS — E1169 Type 2 diabetes mellitus with other specified complication: Secondary | ICD-10-CM

## 2015-05-25 DIAGNOSIS — E669 Obesity, unspecified: Secondary | ICD-10-CM | POA: Insufficient documentation

## 2015-05-25 NOTE — Progress Notes (Signed)
Patient was seen on 05/25/15 for the third of a series of three diabetes self-management courses at the [facility].   Sierra Johnson the amount of activity recommended for healthy living . Describe activities suitable for individual needs . Identify ways to regularly incorporate activity into daily life . Identify barriers to activity and ways to over come these barriers  Identify diabetes medications being personally used and their primary action for lowering glucose and possible side effects . Describe role of stress on blood glucose and develop strategies to address psychosocial issues . Identify diabetes complications and ways to prevent them  Explain how to manage diabetes during illness . Evaluate success in meeting personal goal . Establish 2-3 goals that they will plan to diligently work on until they return for the  16-month follow-up visit  Goals:   I will count my carb choices at most meals and snacks  I will be active 30 minutes or more 5 times a week  I will take my diabetes medications as scheduled  Your patient has identified these potential barriers to change:  N/A  Your patient has identified their diabetes self-care support plan as  Family Support  Plan:  Attend Optional Core 4 in 4 months

## 2015-10-12 ENCOUNTER — Encounter: Payer: Self-pay | Admitting: Internal Medicine

## 2015-10-12 ENCOUNTER — Ambulatory Visit (INDEPENDENT_AMBULATORY_CARE_PROVIDER_SITE_OTHER): Payer: BLUE CROSS/BLUE SHIELD | Admitting: Internal Medicine

## 2015-10-12 ENCOUNTER — Other Ambulatory Visit (INDEPENDENT_AMBULATORY_CARE_PROVIDER_SITE_OTHER): Payer: BLUE CROSS/BLUE SHIELD

## 2015-10-12 VITALS — BP 122/82 | HR 67 | Temp 98.0°F | Wt 237.0 lb

## 2015-10-12 DIAGNOSIS — E669 Obesity, unspecified: Secondary | ICD-10-CM

## 2015-10-12 DIAGNOSIS — I10 Essential (primary) hypertension: Secondary | ICD-10-CM | POA: Diagnosis not present

## 2015-10-12 DIAGNOSIS — E119 Type 2 diabetes mellitus without complications: Secondary | ICD-10-CM | POA: Diagnosis not present

## 2015-10-12 DIAGNOSIS — E1169 Type 2 diabetes mellitus with other specified complication: Secondary | ICD-10-CM

## 2015-10-12 LAB — HEMOGLOBIN A1C: HEMOGLOBIN A1C: 6.8 % — AB (ref 4.6–6.5)

## 2015-10-12 LAB — BASIC METABOLIC PANEL
BUN: 14 mg/dL (ref 6–23)
CO2: 31 mEq/L (ref 19–32)
CREATININE: 0.93 mg/dL (ref 0.40–1.20)
Calcium: 9.9 mg/dL (ref 8.4–10.5)
Chloride: 102 mEq/L (ref 96–112)
GFR: 78.47 mL/min (ref >60.00)
Glucose, Bld: 119 mg/dL — ABNORMAL HIGH (ref 70–99)
Potassium: 3.9 mEq/L (ref 3.5–5.1)
Sodium: 139 mEq/L (ref 135–145)

## 2015-10-12 NOTE — Assessment & Plan Note (Signed)
4/17 can I go off Insulin? Not checking CBGs - "my machine broke 1 mo ago". New machine given. No change in Rx Loose wt Endocr consult Labs Compliance discussed.

## 2015-10-12 NOTE — Assessment & Plan Note (Signed)
On Maxzide

## 2015-10-12 NOTE — Assessment & Plan Note (Addendum)
Wt Readings from Last 3 Encounters:  10/12/15 237 lb (107.502 kg)  05/11/15 223 lb (101.152 kg)  04/21/15 225 lb (102.059 kg)  Lap band option was declined

## 2015-10-12 NOTE — Progress Notes (Signed)
Subjective:  Patient ID: Sierra Johnson, female    DOB: November 18, 1950  Age: 65 y.o. MRN: [id number]  CC: No chief complaint on file.   HPI SONJIA TE presents for DM - can I go off Insulin? Not checking CBGs - "my machine broke 1 mo ago". F/u HTN, obesity.  Outpatient Prescriptions Prior to Visit  Medication Sig Dispense Refill  . Cholecalciferol (VITAMIN D3) 1000 UNITS CAPS Take 1 capsule by mouth daily.     . fish oil-omega-3 fatty acids 1000 MG capsule Take 1 capsule by mouth 2 (two) times daily.      . fluticasone (FLONASE) 50 MCG/ACT nasal spray Place 2 sprays into the nose as needed for allergies.     [name] glucose blood (ONETOUCH VERIO) test strip Use twice daily as instructed 100 each 3  . Insulin Glargine (TOUJEO SOLOSTAR) 300 UNIT/ML SOPN Inject 18 Units into the skin every morning. 3 pen 5  . Insulin Pen Needle (BD ULTRA-FINE PEN NEEDLES) 29G X 12.7MM MISC Use qd 100 each 3  . metFORMIN (GLUCOPHAGE) 500 MG tablet Take 1 tablet (500 mg total) by mouth 3 (three) times daily. 270 tablet 3  . ONETOUCH DELICA LANCETS 99991111 MISC 1 each by Does not apply route 2 (two) times daily as needed. 100 each 3  . triamcinolone ointment (KENALOG) 0.1 % Apply 1 application topically 2 (two) times daily. 30 g 1  . triamterene-hydrochlorothiazide (MAXZIDE-25) 37.5-25 MG per tablet Take 1 tablet by mouth daily. 30 tablet 11  . vitamin B-12 (CYANOCOBALAMIN) 1000 MCG tablet Take 1,000 mcg by mouth daily.      [name] levofloxacin (LEVAQUIN) 500 MG tablet Take 1 tablet (500 mg total) by mouth daily. (Patient not taking: Reported on 10/12/2015) 10 tablet 0   No facility-administered medications prior to visit.    ROS Review of Systems  Constitutional: Negative for chills, activity change, appetite change, fatigue and unexpected weight change.  HENT: Negative for congestion, mouth sores and sinus pressure.   Eyes: Negative for visual disturbance.  Respiratory: Negative for cough and chest tightness.     Gastrointestinal: Negative for nausea and abdominal pain.  Genitourinary: Negative for frequency, difficulty urinating and vaginal pain.  Musculoskeletal: Negative for back pain and gait problem.  Skin: Negative for pallor and rash.  Neurological: Negative for dizziness, tremors, weakness, numbness and headaches.  Psychiatric/Behavioral: Negative for suicidal ideas, confusion and sleep disturbance.    Objective:  BP 122/82 mmHg  Pulse 67  Temp(Src) 98 F (36.7 C)  Wt 237 lb (107.502 kg)  SpO2 98%  BP Readings from Last 3 Encounters:  10/12/15 122/82  04/26/15 114/72  04/21/15 130/86    Wt Readings from Last 3 Encounters:  10/12/15 237 lb (107.502 kg)  05/11/15 223 lb (101.152 kg)  04/21/15 225 lb (102.059 kg)    Physical Exam  Constitutional: She appears well-developed. No distress.  HENT:  Head: Normocephalic.  Right Ear: External ear normal.  Left Ear: External ear normal.  Nose: Nose normal.  Mouth/Throat: Oropharynx is clear and moist.  Eyes: Conjunctivae are normal. Pupils are equal, round, and reactive to light. Right eye exhibits no discharge. Left eye exhibits no discharge.  Neck: Normal range of motion. Neck supple. No JVD present. No tracheal deviation present. No thyromegaly present.  Cardiovascular: Normal rate, regular rhythm and normal heart sounds.   Pulmonary/Chest: No stridor. No respiratory distress. She has no wheezes.  Abdominal: Soft. Bowel sounds are normal. She exhibits no distension and no mass.  There is no tenderness. There is no rebound and no guarding.  Musculoskeletal: She exhibits no edema or tenderness.  Lymphadenopathy:    She has no cervical adenopathy.  Neurological: She displays normal reflexes. No cranial nerve deficit. She exhibits normal muscle tone. Coordination normal.  Skin: No rash noted. No erythema.  Psychiatric: She has a normal mood and affect. Her behavior is normal. Judgment and thought content normal.    Lab Results   Component Value Date   WBC 9.7 04/13/2015   HGB 14.3 04/13/2015   HCT 42.9 04/13/2015   PLT 328.0 04/13/2015   GLUCOSE 438* 04/13/2015   CHOL 243* 04/13/2015   TRIG 131.0 04/13/2015   HDL 68.30 04/13/2015   LDLDIRECT 119.5 08/27/2012   LDLCALC 149* 04/13/2015   ALT 22 04/30/2014   AST 19 04/30/2014   NA 132* 04/13/2015   K 4.7 04/13/2015   CL 91* 04/13/2015   CREATININE 1.14 04/13/2015   BUN 16 04/13/2015   CO2 29 04/13/2015   TSH 1.11 04/13/2015   HGBA1C 12.0* 04/13/2015   MICROALBUR 16.1* 04/25/2013    No results found.  Assessment & Plan:   There are no diagnoses linked to this encounter. I am having Ms. Brogden maintain her fish oil-omega-3 fatty acids, vitamin B-12, Vitamin D3, fluticasone, triamterene-hydrochlorothiazide, levofloxacin, triamcinolone ointment, ONETOUCH DELICA LANCETS 99991111, Insulin Pen Needle, Insulin Glargine, glucose blood, and metFORMIN.  No orders of the defined types were placed in this encounter.     Follow-up: No Follow-up on file.  Walker Kehr, MD

## 2015-10-12 NOTE — Progress Notes (Signed)
Pre visit review using our clinic review tool, if applicable. No additional management support is needed unless otherwise documented below in the visit note. 

## 2015-10-14 ENCOUNTER — Telehealth: Payer: Self-pay | Admitting: *Deleted

## 2015-10-14 MED ORDER — CELECOXIB 200 MG PO CAPS: 200.0000 mg | ORAL_CAPSULE | Freq: Two times a day (BID) | ORAL | Status: DC | PRN

## 2015-10-14 NOTE — Telephone Encounter (Signed)
Received call pt states she fail to ask MD can she try some celebrex for her arthritis knee pain, or is there something else he would recommend...Sierra Johnson

## 2015-10-14 NOTE — Telephone Encounter (Signed)
Ok to try - emailed Thx

## 2015-10-14 NOTE — Telephone Encounter (Signed)
Notified pt MD sent rxto [facility].../lmb 

## 2015-10-15 ENCOUNTER — Telehealth: Payer: Self-pay

## 2015-10-15 MED ORDER — FREESTYLE LANCETS MISC: 1.0000 | Freq: Two times a day (BID) | Status: DC

## 2015-10-15 MED ORDER — GLUCOSE BLOOD VI STRP: 1.0000 | ORAL_STRIP | Freq: Two times a day (BID) | Status: DC

## 2015-10-15 MED ORDER — FREESTYLE SYSTEM KIT: 1.0000 | PACK | Freq: Two times a day (BID) | Status: DC

## 2015-10-15 NOTE — Telephone Encounter (Signed)
Patient needs a new meter needs and RX for a freestyle so that insurance will pay for it. Please follow up. Thank you. Patient uses [facility] on [address]

## 2015-10-15 NOTE — Telephone Encounter (Signed)
Done. See meds.  

## 2015-10-15 NOTE — Addendum Note (Signed)
Addended by: Cresenciano Lick on: 10/15/2015 02:31 PM   Modules accepted: Orders, Medications

## 2015-10-15 NOTE — Telephone Encounter (Signed)
Left detailed mess informing pt.  

## 2015-11-10 ENCOUNTER — Encounter: Payer: Self-pay | Admitting: Endocrinology

## 2015-12-09 ENCOUNTER — Other Ambulatory Visit: Payer: Self-pay | Admitting: Internal Medicine

## 2015-12-09 NOTE — Telephone Encounter (Signed)
OK Prom-cod syr OV if sick Thx

## 2015-12-09 NOTE — Telephone Encounter (Signed)
Patient states she is sick and has cough.  States she wants Dr. Camila Li to call her in a cough med.  Informed patient she would have to be seen but patient states Dr. Camila Li always calls her in medication.

## 2015-12-10 ENCOUNTER — Ambulatory Visit (HOSPITAL_COMMUNITY)
Admission: EM | Admit: 2015-12-10 | Discharge: 2015-12-10 | Disposition: A | Discharge status: Home / Self Care | Payer: BLUE CROSS/BLUE SHIELD | Attending: Emergency Medicine | Admitting: Emergency Medicine

## 2015-12-10 ENCOUNTER — Encounter (HOSPITAL_COMMUNITY): Payer: Self-pay | Admitting: Emergency Medicine

## 2015-12-10 DIAGNOSIS — J069 Acute upper respiratory infection, unspecified: Secondary | ICD-10-CM | POA: Diagnosis not present

## 2015-12-10 DIAGNOSIS — J40 Bronchitis, not specified as acute or chronic: Secondary | ICD-10-CM | POA: Diagnosis not present

## 2015-12-10 MED ORDER — PROMETHAZINE-CODEINE 6.25-10 MG/5ML PO SYRP: 5.0000 mL | ORAL_SOLUTION | ORAL | Status: DC | PRN

## 2015-12-10 MED ORDER — AZITHROMYCIN 250 MG PO TABS: 250.0000 mg | ORAL_TABLET | Freq: Every day | ORAL | Status: DC

## 2015-12-10 MED ORDER — IPRATROPIUM BROMIDE 0.06 % NA SOLN: 2.0000 | Freq: Four times a day (QID) | NASAL | Status: DC

## 2015-12-10 MED ORDER — BENZONATATE 200 MG PO CAPS: 200.0000 mg | ORAL_CAPSULE | Freq: Three times a day (TID) | ORAL | Status: DC | PRN

## 2015-12-10 MED ORDER — ALBUTEROL SULFATE HFA 108 (90 BASE) MCG/ACT IN AERS: 2.0000 | INHALATION_SPRAY | RESPIRATORY_TRACT | Status: DC | PRN

## 2015-12-10 MED ORDER — AEROCHAMBER PLUS MISC: Status: DC

## 2015-12-10 NOTE — ED Provider Notes (Signed)
HPI  SUBJECTIVE:  Sierra Johnson is a 65 y.o. female who presents with 1 week of nasal congestion, rhinorrhea, postnasal drip, cough occasionally productive yellowish sputum. She has been taking ibuprofen and Mucinex cough and severe congestion. Symptoms are better with ibuprofen. No aggravating factors. No fevers, bodyaches, headaches, chest pain, shortness of breath, wheezing, ear pain. No sinus pain or pressure, allergy symptoms. No known sick contacts. No double sickening, but patient states that she is not getting better. She has taken ibuprofen 200 mg within the past 6-8 hours prior to evaluation. Past medical history of diabetes, hypertension. No history of asthma, emphysema, COPD, smoking. PMD: Dr. Alain Marion   Past Medical History  Diagnosis Date  . ANEMIA-NOS 04/24/2007  . HYPERLIPIDEMIA 07/17/2007    mild  . VITAMIN B12 DEFICIENCY 04/24/2007  . DIABETES MELLITUS, TYPE II 04/24/2007  . OBSTRUCTIVE SLEEP APNEA 09/30/2009    on CPAP 2011  . HYPERTENSION 04/24/2007  . Menopause 1998    Dr. Gaetano Net  . Elevated glucose 2011    Past Surgical History  Procedure Laterality Date  . Abdominal hysterectomy    . Hernia repair  11/11    Incar. midline hernia  . Incisional hernia repair N/A 06/03/2014    Procedure: LAPAROSCOPIC REPAIR RECURRENT  INCISIONAL HERNIA;  Surgeon: Georganna Skeans, MD;  Location: [facility];  Service: General;  Laterality: N/A;  . Insertion of mesh N/A 06/03/2014    Procedure: INSERTION OF MESH;  Surgeon: Georganna Skeans, MD;  Location: [facility];  Service: General;  Laterality: N/A;    Family History  Problem Relation Age of Onset  . Hypertension Other   . Heart disease Mother 53    ?CAD  . Cancer Father     lung  . Heart disease Brother 14    CAD    Social History  Substance Use Topics  . Smoking status: Never Smoker   . Smokeless tobacco: Never Used  . Alcohol Use: No    No current facility-administered medications for this encounter.  Current outpatient  prescriptions:  .  celecoxib (CELEBREX) 200 MG capsule, Take 1 capsule (200 mg total) by mouth 2 (two) times daily as needed for moderate pain., Disp: 60 capsule, Rfl: 3 .  Cholecalciferol (VITAMIN D3) 1000 UNITS CAPS, Take 1 capsule by mouth daily. , Disp: , Rfl:  .  fish oil-omega-3 fatty acids 1000 MG capsule, Take 1 capsule by mouth 2 (two) times daily.  , Disp: , Rfl:  .  glucose blood test strip, 1 each by Other route 2 (two) times daily. Dx: E11.9, I10, Disp: 100 each, Rfl: 12 .  glucose monitoring kit (FREESTYLE) monitoring kit, 1 each by Does not apply route 2 (two) times daily. Dx: E11.9, I10, Disp: 1 each, Rfl: 0 .  Insulin Glargine (TOUJEO SOLOSTAR) 300 UNIT/ML SOPN, Inject 18 Units into the skin every morning., Disp: 3 pen, Rfl: 5 .  Insulin Pen Needle (BD ULTRA-FINE PEN NEEDLES) 29G X 12.7MM MISC, Use qd, Disp: 100 each, Rfl: 3 .  Lancets (FREESTYLE) lancets, 1 each by Other route 2 (two) times daily. Dx: E11.9, I10, Disp: 100 each, Rfl: 12 .  metFORMIN (GLUCOPHAGE) 500 MG tablet, Take 1 tablet (500 mg total) by mouth 3 (three) times daily., Disp: 270 tablet, Rfl: 3 .  triamterene-hydrochlorothiazide (MAXZIDE-25) 37.5-25 MG per tablet, Take 1 tablet by mouth daily., Disp: 30 tablet, Rfl: 11 .  vitamin B-12 (CYANOCOBALAMIN) 1000 MCG tablet, Take 1,000 mcg by mouth daily.  , Disp: , Rfl:  .  albuterol (PROVENTIL HFA;VENTOLIN HFA) 108 (90 Base) MCG/ACT inhaler, Inhale 2 puffs into the lungs every 4 (four) hours as needed for wheezing or shortness of breath. Dispense with aerochamber, Disp: 1 Inhaler, Rfl: 0 .  azithromycin (ZITHROMAX) 250 MG tablet, Take 1 tablet (250 mg total) by mouth daily. 2 tabs po on day 1, 1 tab po on days 2-5, Disp: 6 tablet, Rfl: 0 .  benzonatate (TESSALON) 200 MG capsule, Take 1 capsule (200 mg total) by mouth 3 (three) times daily as needed for cough., Disp: 20 capsule, Rfl: 0 .  ipratropium (ATROVENT) 0.06 % nasal spray, Place 2 sprays into both nostrils 4  (four) times daily. 3-4 times/ day, Disp: 15 mL, Rfl: 0 .  promethazine-codeine (PHENERGAN WITH CODEINE) 6.25-10 MG/5ML syrup, Take 5 mLs by mouth every 4 (four) hours as needed., Disp: 300 mL, Rfl: 0 .  Spacer/Aero-Holding Chambers (AEROCHAMBER PLUS) inhaler, Use as instructed, Disp: 1 each, Rfl: 2 .  triamcinolone ointment (KENALOG) 0.1 %, Apply 1 application topically 2 (two) times daily., Disp: 30 g, Rfl: 1  Allergies  Allergen Reactions  . Amlodipine     LE swelling on 10 mg  . Benazepril Swelling    Lip swelling  . Tetracycline Nausea And Vomiting     ROS  As noted in HPI.   Physical Exam  BP 185/97 mmHg  Pulse 82  Temp(Src) 98.4 F (36.9 C) (Oral)  Resp 20  SpO2 98%  BP Readings from Last 3 Encounters:  12/10/15 185/97  10/12/15 122/82  04/26/15 114/72   Constitutional: Well developed, well nourished, no acute distress. Speaking in full sentences.  Eyes: PERRL, EOMI, conjunctiva normal bilaterally HENT: Normocephalic, atraumatic,mucus membranes moist. TMs normal bilaterally. Positive nasal congestion, erythematous swollen turbinates. No sinus tenderness. Positive postnasal drip, cobblestoning oropharynx. Oropharynx otherwise unremarkable. Respiratory: wheezing, occasional ronchi LLL. Good air movement.  Cardiovascular: Normal rate and rhythm, no murmurs, no gallops, no rubs GI: Soft, nondistended, normal bowel sounds, nontender, no rebound, no guarding skin: No rash, skin intact Musculoskeletal: No edema, no tenderness, no deformities Neurologic: Alert & oriented x 3, CN II-XII grossly intact, no motor deficits, sensation grossly intact Psychiatric: Speech and behavior appropriate   ED Course   Medications - No data to display  No orders of the defined types were placed in this encounter.   No results found for this or any previous visit (from the past 24 hour(s)). No results found.  ED Clinical Impression  URI (upper respiratory  infection)  Bronchitis   ED Assessment/Plan  She is not in respiratory distress, speaking full sentences, satting 98% on room air. No history of fevers, afebrile here, but has taken ibuprofen within the past 6-8 hours prior to arrival. Discussed getting x-ray with patient today given the focal lung findings in her left lower lobe, however, since it will not change management, patient opted to not do this today. She will return here or follow up with Dr. Alain Marion for x-ray if not getting better in several days. Home with azithromycin, Atrovent nasal spray, albuterol with spacer, Tessalon Perles. Cough syrup was Rx'd by PMD today. no prednisone as she is a diabetic. She is continue regular Mucinex, saline nasal irrigation.   Discussed  MDM, plan and followup with patient . Discussed sn/sx that should prompt return to the ED. Patient agrees with plan.   *This clinic note was created using Dragon dictation software. Therefore, there may be occasional mistakes despite careful proofreading.  ?  Melynda Ripple, MD 12/10/15  2046

## 2015-12-10 NOTE — Discharge Instructions (Signed)
2 puffs from your albuterol inhaler every 4-6 hours or as needed for coughing, wheezing or shortness of breath. Use the Tessalon Perles during the day, cough syrup at night. Start saline nasal irrigation, neti pot ordeal med sinus rinse. The Atrovent nasal spray will help with the postnasal drip and nasal congestion. Use regular Mucinex, nothing that says Mucinex D has decongestants in it. This will elevate your blood pressure.

## 2015-12-10 NOTE — ED Notes (Signed)
The patient presented to the [facility] with a complaint of a cough x 2 days.

## 2015-12-10 NOTE — Telephone Encounter (Signed)
[facility]

## 2015-12-27 ENCOUNTER — Telehealth: Payer: Self-pay | Admitting: Pulmonary Disease

## 2015-12-27 NOTE — Telephone Encounter (Addendum)
ATC pt, VM not available. WCB

## 2015-12-27 NOTE — Telephone Encounter (Signed)
Pt returning call please advise.[name]

## 2015-12-27 NOTE — Telephone Encounter (Signed)
ATC pt, VM NA. WCB

## 2015-12-28 NOTE — Telephone Encounter (Signed)
atc pt, no answer, no vm.  Wcb.  

## 2015-12-28 NOTE — Telephone Encounter (Signed)
lmtcb x1 for pt. When she calls back we need to make her yearly ROV in September with RA.

## 2015-12-28 NOTE — Telephone Encounter (Signed)
Appoint made for 9/20 @ 9:00 still needing to speak to nurse about machine?Sierra Johnson

## 2015-12-28 NOTE — Telephone Encounter (Signed)
Pt returned call will make her an appoint for sept.Sierra Johnson

## 2015-12-29 NOTE — Telephone Encounter (Signed)
Attempted to contact pt. No answer, no option to leave a message. Will try back.  

## 2015-12-30 NOTE — Telephone Encounter (Signed)
Attempted to contact pt. No answer, no option to leave a message. Will try back.  

## 2015-12-31 NOTE — Telephone Encounter (Signed)
Attempted to contact pt again. No answer, no option to leave a message. Will close per triage protocol.

## 2016-02-15 ENCOUNTER — Other Ambulatory Visit: Payer: Self-pay | Admitting: Internal Medicine

## 2016-03-08 ENCOUNTER — Ambulatory Visit (INDEPENDENT_AMBULATORY_CARE_PROVIDER_SITE_OTHER): Payer: BLUE CROSS/BLUE SHIELD | Admitting: Pulmonary Disease

## 2016-03-08 ENCOUNTER — Encounter: Payer: Self-pay | Admitting: Pulmonary Disease

## 2016-03-08 DIAGNOSIS — G4733 Obstructive sleep apnea (adult) (pediatric): Secondary | ICD-10-CM | POA: Diagnosis not present

## 2016-03-08 DIAGNOSIS — G4700 Insomnia, unspecified: Secondary | ICD-10-CM

## 2016-03-08 DIAGNOSIS — I10 Essential (primary) hypertension: Secondary | ICD-10-CM

## 2016-03-08 NOTE — Progress Notes (Signed)
   Subjective:    Patient ID: Sierra Johnson, female    DOB: 08/16/1950, 65 y.o.   MRN: [id number]  HPI  64/F for followup of OSA. She also has issues with sleep maintenance insomnia  03/08/2016  Chief Complaint  Patient presents with  . Follow-up    Former [place] pt, not doing well on machine, can't sleep as well on machine anymore, thinks she may need new machine.     She had current CPAP machine for 7 years Used to be working well but now she has trouble falling asleep Bedtime is between 8:30 9:30 PM, sleep latency is about an hour and she reports nocturnal awakening about 3 AM and then difficulty going back to sleep she denies naps in the daytime, and denies excessive use of caffeinated beverages or any stressors  CPAP download shows good control of events and 12 cm with good compliance Her weight is unchanged She's not tried any over-the-counter medications for sleep and reports that she is doing some exercise   Review of Systems Patient denies significant dyspnea,cough, hemoptysis,  chest pain, palpitations, pedal edema, orthopnea, paroxysmal nocturnal dyspnea, lightheadedness, nausea, vomiting, abdominal or  leg pains      Objective:   Physical Exam  Gen. Pleasant, obese, in no distress ENT - no lesions, no post nasal drip Neck: No JVD, no thyromegaly, no carotid bruits Lungs: no use of accessory muscles, no dullness to percussion, decreased without rales or rhonchi  Cardiovascular: Rhythm regular, heart sounds  normal, no murmurs or gallops, no peripheral edema Musculoskeletal: No deformities, no cyanosis or clubbing , no tremors        Assessment & Plan:

## 2016-03-08 NOTE — Assessment & Plan Note (Signed)
Unclear cause- no stressors   Suggest melatonin 3-5 mg around 7:30 PM

## 2016-03-08 NOTE — Patient Instructions (Addendum)
Suggest melatonin 3-5 mg around 7:30 PM We will last DME to look into providing you with a new machine CPAP is set at 12 cm and seems to be working well

## 2016-03-08 NOTE — Addendum Note (Signed)
Addended by: Mathis Dad on: 03/08/2016 02:33 PM   Modules accepted: Orders

## 2016-03-08 NOTE — Assessment & Plan Note (Signed)
We will last DME to look into providing you with a new machine CPAP is set at 12 cm and seems to be working well  Weight loss encouraged, compliance with goal of at least 4-6 hrs every night is the expectation. Advised against medications with sedative side effects Cautioned against driving when sleepy - understanding that sleepiness will vary on a day to day basis

## 2016-03-08 NOTE — Assessment & Plan Note (Signed)
Blood pressure high today Follow up with PCP

## 2016-03-17 ENCOUNTER — Other Ambulatory Visit: Payer: Self-pay | Admitting: *Deleted

## 2016-03-17 MED ORDER — INSULIN GLARGINE 300 UNIT/ML ~~LOC~~ SOPN: 18.0000 [IU] | PEN_INJECTOR | Freq: Every morning | SUBCUTANEOUS | 1 refills | Status: DC

## 2016-03-17 NOTE — Addendum Note (Signed)
Addended by: Earnstine Regal on: 03/17/2016 01:15 PM   Modules accepted: Orders

## 2016-03-20 ENCOUNTER — Other Ambulatory Visit: Payer: Self-pay | Admitting: *Deleted

## 2016-03-20 MED ORDER — INSULIN GLARGINE 300 UNIT/ML ~~LOC~~ SOPN: 18.0000 [IU] | PEN_INJECTOR | Freq: Every morning | SUBCUTANEOUS | 1 refills | Status: DC

## 2016-03-22 ENCOUNTER — Encounter: Payer: Self-pay | Admitting: Pulmonary Disease

## 2016-04-05 ENCOUNTER — Other Ambulatory Visit: Payer: Self-pay | Admitting: Internal Medicine

## 2016-04-13 ENCOUNTER — Encounter: Payer: Self-pay | Admitting: Internal Medicine

## 2016-04-13 ENCOUNTER — Other Ambulatory Visit (INDEPENDENT_AMBULATORY_CARE_PROVIDER_SITE_OTHER): Payer: BLUE CROSS/BLUE SHIELD

## 2016-04-13 ENCOUNTER — Ambulatory Visit (INDEPENDENT_AMBULATORY_CARE_PROVIDER_SITE_OTHER): Payer: BLUE CROSS/BLUE SHIELD | Admitting: Internal Medicine

## 2016-04-13 VITALS — BP 130/72 | HR 74 | Ht 60.0 in | Wt 237.0 lb

## 2016-04-13 DIAGNOSIS — Z0000 Encounter for general adult medical examination without abnormal findings: Secondary | ICD-10-CM

## 2016-04-13 DIAGNOSIS — E1169 Type 2 diabetes mellitus with other specified complication: Secondary | ICD-10-CM

## 2016-04-13 DIAGNOSIS — E538 Deficiency of other specified B group vitamins: Secondary | ICD-10-CM | POA: Diagnosis not present

## 2016-04-13 DIAGNOSIS — E669 Obesity, unspecified: Secondary | ICD-10-CM

## 2016-04-13 DIAGNOSIS — I10 Essential (primary) hypertension: Secondary | ICD-10-CM

## 2016-04-13 DIAGNOSIS — M170 Bilateral primary osteoarthritis of knee: Secondary | ICD-10-CM | POA: Diagnosis not present

## 2016-04-13 LAB — CBC WITH DIFFERENTIAL/PLATELET
BASOS PCT: 0.3 % (ref 0.0–3.0)
Basophils Absolute: 0 10*3/uL (ref 0.0–0.1)
EOS ABS: 0.2 10*3/uL (ref 0.0–0.7)
EOS PCT: 1.5 % (ref 0.0–5.0)
HEMATOCRIT: 38.3 % (ref 36.0–46.0)
HEMOGLOBIN: 12.8 g/dL (ref 12.0–15.0)
LYMPHS PCT: 25.4 % (ref 12.0–46.0)
Lymphs Abs: 2.6 10*3/uL (ref 0.7–4.0)
MCHC: 33.4 g/dL (ref 30.0–36.0)
MCV: 83.8 fl (ref 78.0–100.0)
MONOS PCT: 7.7 % (ref 3.0–12.0)
Monocytes Absolute: 0.8 10*3/uL (ref 0.1–1.0)
NEUTROS ABS: 6.7 10*3/uL (ref 1.4–7.7)
Neutrophils Relative %: 65.1 % (ref 43.0–77.0)
PLATELETS: 365 10*3/uL (ref 150.0–400.0)
RBC: 4.57 Mil/uL (ref 3.87–5.11)
RDW: 14.9 % (ref 11.5–15.5)
WBC: 10.3 10*3/uL (ref 4.0–10.5)

## 2016-04-13 LAB — URINALYSIS, ROUTINE W REFLEX MICROSCOPIC
BILIRUBIN URINE: NEGATIVE
Hgb urine dipstick: NEGATIVE
Ketones, ur: NEGATIVE
LEUKOCYTES UA: NEGATIVE
Nitrite: NEGATIVE
PH: 6 (ref 5.0–8.0)
RBC / HPF: NONE SEEN (ref 0–?)
Specific Gravity, Urine: 1.01 (ref 1.000–1.030)
TOTAL PROTEIN, URINE-UPE24: 100 — AB
URINE GLUCOSE: NEGATIVE
Urobilinogen, UA: 0.2 (ref 0.0–1.0)

## 2016-04-13 LAB — BASIC METABOLIC PANEL
BUN: 15 mg/dL (ref 6–23)
CALCIUM: 10.4 mg/dL (ref 8.4–10.5)
CO2: 30 mEq/L (ref 19–32)
CREATININE: 1.04 mg/dL (ref 0.40–1.20)
Chloride: 103 mEq/L (ref 96–112)
GFR: 68.86 mL/min (ref >60.00)
GLUCOSE: 108 mg/dL — AB (ref 70–99)
POTASSIUM: 4.5 meq/L (ref 3.5–5.1)
Sodium: 141 mEq/L (ref 135–145)

## 2016-04-13 LAB — HEPATIC FUNCTION PANEL
ALBUMIN: 4.1 g/dL (ref 3.5–5.2)
ALK PHOS: 70 U/L (ref 39–117)
ALT: 15 U/L (ref 0–35)
AST: 16 U/L (ref 0–37)
Bilirubin, Direct: 0.1 mg/dL (ref 0.0–0.3)
TOTAL PROTEIN: 8.1 g/dL (ref 6.0–8.3)
Total Bilirubin: 0.4 mg/dL (ref 0.2–1.2)

## 2016-04-13 LAB — TSH: TSH: 1.25 u[IU]/mL (ref 0.35–4.50)

## 2016-04-13 LAB — MICROALBUMIN / CREATININE URINE RATIO
CREATININE, U: 95.1 mg/dL
MICROALB/CREAT RATIO: 39.3 mg/g — AB (ref 0.0–30.0)
Microalb, Ur: 37.4 mg/dL — ABNORMAL HIGH (ref 0.0–1.9)

## 2016-04-13 LAB — HEMOGLOBIN A1C: HEMOGLOBIN A1C: 6.7 % — AB (ref 4.6–6.5)

## 2016-04-13 LAB — LIPID PANEL
CHOLESTEROL: 239 mg/dL — AB (ref 0–200)
HDL: 74.2 mg/dL (ref >39.00)
LDL Cholesterol: 145 mg/dL — ABNORMAL HIGH (ref 0–99)
NONHDL: 164.55
Total CHOL/HDL Ratio: 3
Triglycerides: 97 mg/dL (ref 0.0–149.0)
VLDL: 19.4 mg/dL (ref 0.0–40.0)

## 2016-04-13 MED ORDER — FREESTYLE LANCETS MISC: 1.0000 | Freq: Two times a day (BID) | 11 refills | Status: DC

## 2016-04-13 MED ORDER — IBUPROFEN 600 MG PO TABS: 600.0000 mg | ORAL_TABLET | Freq: Two times a day (BID) | ORAL | 3 refills | Status: DC | PRN

## 2016-04-13 NOTE — Progress Notes (Signed)
Subjective:  Patient ID: Sierra Johnson, female    DOB: 1951-01-10  Age: 65 y.o. MRN: [id number]  CC: Annual Exam   HPI Sierra Johnson presents for a well exam  Outpatient Medications Prior to Visit  Medication Sig Dispense Refill  . albuterol (PROVENTIL HFA;VENTOLIN HFA) 108 (90 Base) MCG/ACT inhaler Inhale 2 puffs into the lungs every 4 (four) hours as needed for wheezing or shortness of breath. Dispense with aerochamber 1 Inhaler 0  . BD ULTRA-FINE PEN NEEDLES 29G X 12.7MM MISC USE DAILY 100 each 2  . Cholecalciferol (VITAMIN D3) 1000 UNITS CAPS Take 1 capsule by mouth daily.     . fish oil-omega-3 fatty acids 1000 MG capsule Take 1 capsule by mouth 2 (two) times daily.      [name] glucose blood test strip 1 each by Other route 2 (two) times daily. Dx: E11.9, I10 100 each 12  . glucose monitoring kit (FREESTYLE) monitoring kit 1 each by Does not apply route 2 (two) times daily. Dx: E11.9, I10 1 each 0  . Insulin Glargine (TOUJEO SOLOSTAR) 300 UNIT/ML SOPN Inject 18 Units into the skin every morning. 12 pen 1  . Lancets (FREESTYLE) lancets 1 each by Other route 2 (two) times daily. Dx: E11.9, I10 100 each 12  . metFORMIN (GLUCOPHAGE) 500 MG tablet Take 1 tablet (500 mg total) by mouth 3 (three) times daily. 270 tablet 3  . Spacer/Aero-Holding Chambers (AEROCHAMBER PLUS) inhaler Use as instructed 1 each 2  . triamterene-hydrochlorothiazide (MAXZIDE-25) 37.5-25 MG tablet TAKE 1 TABLET BY MOUTH DAILY. 30 tablet 11  . vitamin B-12 (CYANOCOBALAMIN) 1000 MCG tablet Take 1,000 mcg by mouth daily.       No facility-administered medications prior to visit.     ROS Review of Systems  Constitutional: Negative for activity change, appetite change, chills, fatigue and unexpected weight change.  HENT: Negative for congestion, mouth sores and sinus pressure.   Eyes: Negative for visual disturbance.  Respiratory: Negative for cough and chest tightness.   Gastrointestinal: Negative for abdominal  pain and nausea.  Genitourinary: Negative for difficulty urinating, frequency and vaginal pain.  Musculoskeletal: Negative for back pain and gait problem.  Skin: Negative for pallor and rash.  Neurological: Negative for dizziness, tremors, weakness, numbness and headaches.  Psychiatric/Behavioral: Negative for confusion and sleep disturbance.    Objective:  BP 130/72   Pulse 74   Ht 5' (1.524 m)   Wt 237 lb (107.5 kg)   SpO2 97%   BMI 46.29 kg/m   BP Readings from Last 3 Encounters:  04/13/16 130/72  03/08/16 (!) 124/98  12/10/15 185/97    Wt Readings from Last 3 Encounters:  04/13/16 237 lb (107.5 kg)  03/08/16 236 lb 9.6 oz (107.3 kg)  10/12/15 237 lb (107.5 kg)    Physical Exam  Constitutional: She appears well-developed. No distress.  HENT:  Head: Normocephalic.  Right Ear: External ear normal.  Left Ear: External ear normal.  Nose: Nose normal.  Mouth/Throat: Oropharynx is clear and moist.  Eyes: Conjunctivae are normal. Pupils are equal, round, and reactive to light. Right eye exhibits no discharge. Left eye exhibits no discharge.  Neck: Normal range of motion. Neck supple. No JVD present. No tracheal deviation present. No thyromegaly present.  Cardiovascular: Normal rate, regular rhythm and normal heart sounds.   Pulmonary/Chest: No stridor. No respiratory distress. She has no wheezes.  Abdominal: Soft. Bowel sounds are normal. She exhibits no distension and no mass. There is no tenderness.  There is no rebound and no guarding.  Musculoskeletal: She exhibits no edema or tenderness.  Lymphadenopathy:    She has no cervical adenopathy.  Neurological: She displays normal reflexes. No cranial nerve deficit. She exhibits normal muscle tone. Coordination normal.  Skin: No rash noted. No erythema.  Psychiatric: She has a normal mood and affect. Her behavior is normal. Judgment and thought content normal.  Obese R knee is sensitive w/ROM  Lab Results  Component Value  Date   WBC 9.7 04/13/2015   HGB 14.3 04/13/2015   HCT 42.9 04/13/2015   PLT 328.0 04/13/2015   GLUCOSE 119 (H) 10/12/2015   CHOL 243 (H) 04/13/2015   TRIG 131.0 04/13/2015   HDL 68.30 04/13/2015   LDLDIRECT 119.5 08/27/2012   LDLCALC 149 (H) 04/13/2015   ALT 22 04/30/2014   AST 19 04/30/2014   NA 139 10/12/2015   K 3.9 10/12/2015   CL 102 10/12/2015   CREATININE 0.93 10/12/2015   BUN 14 10/12/2015   CO2 31 10/12/2015   TSH 1.11 04/13/2015   HGBA1C 6.8 (H) 10/12/2015   MICROALBUR 16.1 (H) 04/25/2013    No results found.  Assessment & Plan:   There are no diagnoses linked to this encounter. I am having Sierra Johnson maintain her fish oil-omega-3 fatty acids, vitamin B-12, Vitamin D3, metFORMIN, glucose monitoring kit, glucose blood, freestyle, albuterol, AEROCHAMBER PLUS, triamterene-hydrochlorothiazide, Insulin Glargine, and BD ULTRA-FINE PEN NEEDLES.  No orders of the defined types were placed in this encounter.    Follow-up: No Follow-up on file.  Walker Kehr, MD

## 2016-04-13 NOTE — Assessment & Plan Note (Signed)
On B12 

## 2016-04-13 NOTE — Progress Notes (Signed)
Pre visit review using our clinic review tool, if applicable. No additional management support is needed unless otherwise documented below in the visit note. 

## 2016-04-13 NOTE — Patient Instructions (Signed)
Start [employer]

## 2016-04-13 NOTE — Assessment & Plan Note (Signed)
Ibuprofen prn  Potential benefits of a long term NSAID use as well as potential risks and complications  were explained to the patient and were aknowledged. 

## 2016-04-13 NOTE — Assessment & Plan Note (Signed)
Metformin  Toujeo

## 2016-04-13 NOTE — Assessment & Plan Note (Signed)
We discussed age appropriate health related issues, including available/recomended screening tests and vaccinations. We discussed a need for adhering to healthy di et and exercise. Labs were ordered. All questions were answered. She is a Sports administrator. Labs

## 2016-04-13 NOTE — Assessment & Plan Note (Signed)
  Maxzide to 1 tab a day

## 2016-04-14 LAB — HEPATITIS C ANTIBODY: HCV AB: NEGATIVE

## 2016-04-23 ENCOUNTER — Other Ambulatory Visit: Payer: Self-pay | Admitting: Internal Medicine

## 2016-06-19 ENCOUNTER — Other Ambulatory Visit: Payer: Self-pay | Admitting: Internal Medicine

## 2016-07-27 ENCOUNTER — Encounter: Payer: Self-pay | Admitting: Internal Medicine

## 2016-08-06 ENCOUNTER — Ambulatory Visit (HOSPITAL_COMMUNITY)
Admission: EM | Admit: 2016-08-06 | Discharge: 2016-08-06 | Disposition: A | Discharge status: Home / Self Care | Payer: BLUE CROSS/BLUE SHIELD | Attending: Family Medicine | Admitting: Family Medicine

## 2016-08-06 ENCOUNTER — Encounter (HOSPITAL_COMMUNITY): Payer: Self-pay | Admitting: Emergency Medicine

## 2016-08-06 DIAGNOSIS — J069 Acute upper respiratory infection, unspecified: Secondary | ICD-10-CM | POA: Diagnosis not present

## 2016-08-06 DIAGNOSIS — J111 Influenza due to unidentified influenza virus with other respiratory manifestations: Secondary | ICD-10-CM

## 2016-08-06 DIAGNOSIS — R69 Illness, unspecified: Secondary | ICD-10-CM | POA: Diagnosis not present

## 2016-08-06 NOTE — ED Triage Notes (Signed)
The patient presented to the [facility] with a complaint of a cough x 1 week. 

## 2016-08-06 NOTE — Discharge Instructions (Signed)
Drink plenty of fluids as discussed, mucinex or delsym for cough. Return or see your doctor if further problems

## 2016-08-06 NOTE — ED Provider Notes (Signed)
[facility]    CSN: [id number] Arrival date & time: 08/06/16  1713     History   Chief Complaint Chief Complaint  Patient presents with  . Cough    HPI Sierra Johnson is a 66 y.o. female.   The history is provided by the patient.  Cough  Cough characteristics:  Non-productive Severity:  Mild Onset quality:  Gradual Duration:  9 days Progression:  Improving Chronicity:  New Smoker: no   Context: upper respiratory infection   Relieved by:  None tried Worsened by:  Nothing Ineffective treatments:  None tried Associated symptoms: fever, myalgias and rhinorrhea   Associated symptoms: no sore throat and no wheezing     Past Medical History:  Diagnosis Date  . ANEMIA-NOS 04/24/2007  . DIABETES MELLITUS, TYPE II 04/24/2007  . Elevated glucose 2011  . HYPERLIPIDEMIA 07/17/2007   mild  . HYPERTENSION 04/24/2007  . Menopause 1998   Dr. Gaetano Net  . OBSTRUCTIVE SLEEP APNEA 09/30/2009   on CPAP 2011  . VITAMIN B12 DEFICIENCY 04/24/2007    Patient Active Problem List   Diagnosis Date Noted  . Insomnia 03/08/2016  . Eczema 04/13/2015  . Edema 02/11/2015  . S/P laparoscopic hernia repair 06/03/2014  . Knee osteoarthritis 04/30/2014  . Recurrent ventral incisional hernia 10/16/2012  . S/P repair of ventral hernia 08/28/2012  . Diabetes mellitus type 2 in obese ([place]) 05/07/2012  . Morbid obesity ([place]) 01/18/2012  . Well adult exam 04/17/2011  . Sinusitis, acute 04/17/2011  . SHOULDER PAIN 12/07/2009  . POLYURIA 12/07/2009  . Obstructive sleep apnea 09/30/2009  . FATIGUE 09/15/2009  . PARESTHESIA 06/08/2008  . Dyslipidemia 07/17/2007  . B12 deficiency 04/24/2007  . Anemia 04/24/2007  . Essential hypertension 04/24/2007    Past Surgical History:  Procedure Laterality Date  . ABDOMINAL HYSTERECTOMY    . HERNIA REPAIR  11/11   Incar. midline hernia  . INCISIONAL HERNIA REPAIR N/A 06/03/2014   Procedure: LAPAROSCOPIC REPAIR RECURRENT  INCISIONAL HERNIA;   Surgeon: Georganna Skeans, MD;  Location: [facility];  Service: General;  Laterality: N/A;  . INSERTION OF MESH N/A 06/03/2014   Procedure: INSERTION OF MESH;  Surgeon: Georganna Skeans, MD;  Location: [facility];  Service: General;  Laterality: N/A;    OB History    No data available       Home Medications    Prior to Admission medications   Medication Sig Start Date End Date Taking? Authorizing Provider  albuterol (PROVENTIL HFA;VENTOLIN HFA) 108 (90 Base) MCG/ACT inhaler Inhale 2 puffs into the lungs every 4 (four) hours as needed for wheezing or shortness of breath. Dispense with aerochamber 12/10/15   Melynda Ripple, MD  BD ULTRA-FINE PEN NEEDLES 29G X 12.7MM MISC USE DAILY 06/20/16   Cassandria Anger, MD  Cholecalciferol (VITAMIN D3) 1000 UNITS CAPS Take 1 capsule by mouth daily.     Historical Provider, MD  fish oil-omega-3 fatty acids 1000 MG capsule Take 1 capsule by mouth 2 (two) times daily.      Historical Provider, MD  glucose blood test strip 1 each by Other route 2 (two) times daily. Dx: E11.9, I10 10/15/15   Aleksei Plotnikov V, MD  glucose monitoring kit (FREESTYLE) monitoring kit 1 each by Does not apply route 2 (two) times daily. Dx: E11.9, I10 10/15/15   Cassandria Anger, MD  ibuprofen (ADVIL,MOTRIN) 600 MG tablet Take 1 tablet (600 mg total) by mouth 2 (two) times daily as needed for moderate pain. 04/13/16  Aleksei Plotnikov V, MD  Insulin Glargine (TOUJEO SOLOSTAR) 300 UNIT/ML SOPN Inject 18 Units into the skin every morning. 03/20/16   Aleksei Plotnikov V, MD  Lancets (FREESTYLE) lancets 1 each by Other route 2 (two) times daily. Dx: E11.9, I10 04/13/16   Lew Dawes V, MD  metFORMIN (GLUCOPHAGE) 500 MG tablet TAKE 1 TABLET BY MOUTH 3 TIMES DAILY 04/24/16   Cassandria Anger, MD  Spacer/Aero-Holding Chambers (AEROCHAMBER PLUS) inhaler Use as instructed 12/10/15   Melynda Ripple, MD  triamterene-hydrochlorothiazide (MAXZIDE-25) 37.5-25 MG tablet TAKE 1 TABLET BY MOUTH  DAILY. 02/16/16   Aleksei Plotnikov V, MD  vitamin B-12 (CYANOCOBALAMIN) 1000 MCG tablet Take 1,000 mcg by mouth daily.      Historical Provider, MD    Family History Family History  Problem Relation Age of Onset  . Hypertension Other   . Heart disease Mother 53    ?CAD  . Cancer Father     lung  . Heart disease Brother 56    CAD    Social History Social History  Substance Use Topics  . Smoking status: Never Smoker  . Smokeless tobacco: Never Used  . Alcohol use No     Allergies   Amlodipine; Benazepril; and Tetracycline   Review of Systems Review of Systems  Constitutional: Positive for fever.  HENT: Positive for congestion, postnasal drip and rhinorrhea. Negative for sore throat.   Respiratory: Positive for cough. Negative for wheezing.   Cardiovascular: Negative.   Musculoskeletal: Positive for myalgias.  All other systems reviewed and are negative.    Physical Exam Triage Vital Signs ED Triage Vitals  Enc Vitals Group     BP 08/06/16 1807 146/75     Pulse Rate 08/06/16 1807 78     Resp 08/06/16 1807 18     Temp 08/06/16 1807 98.6 F (37 C)     Temp Source 08/06/16 1807 Oral     SpO2 08/06/16 1807 98 %     Weight --      Height --      Head Circumference --      Peak Flow --      Pain Score 08/06/16 1806 0     Pain Loc --      Pain Edu? --      Excl. in [place]? --    No data found.   Updated Vital Signs BP 146/75 (BP Location: Right Arm)   Pulse 78   Temp 98.6 F (37 C) (Oral)   Resp 18   SpO2 98%   Visual Acuity Right Eye Distance:   Left Eye Distance:   Bilateral Distance:    Right Eye Near:   Left Eye Near:    Bilateral Near:     Physical Exam  Constitutional: She is oriented to person, place, and time. She appears well-developed and well-nourished. No distress.  HENT:  Right Ear: External ear normal.  Left Ear: External ear normal.  Nose: Nose normal.  Mouth/Throat: Oropharynx is clear and moist.  Eyes: Pupils are equal, round,  and reactive to light.  Neck: Normal range of motion. Neck supple.  Cardiovascular: Normal rate.   Pulmonary/Chest: Effort normal and breath sounds normal.  Neurological: She is alert and oriented to person, place, and time.  Skin: Skin is warm and dry.  Nursing note and vitals reviewed.    UC Treatments / Results  Labs (all labs ordered are listed, but only abnormal results are displayed) Labs Reviewed - No data to display  EKG  EKG Interpretation None       Radiology No results found.  Procedures Procedures (including critical care time)  Medications Ordered in UC Medications - No data to display   Initial Impression / Assessment and Plan / UC Course  I have reviewed the triage vital signs and the nursing notes.  Pertinent labs & imaging results that were available during my care of the patient were reviewed by me and considered in my medical decision making (see chart for details).       Final Clinical Impressions(s) / UC Diagnoses   Final diagnoses:  Influenza-like illness  Upper respiratory tract infection, unspecified type    New Prescriptions New Prescriptions   No medications on file     Billy Fischer, MD 08/06/16 1912

## 2016-08-30 ENCOUNTER — Encounter: Payer: Self-pay | Admitting: Gastroenterology

## 2016-11-10 ENCOUNTER — Other Ambulatory Visit: Payer: Self-pay | Admitting: Internal Medicine

## 2016-12-22 ENCOUNTER — Ambulatory Visit (INDEPENDENT_AMBULATORY_CARE_PROVIDER_SITE_OTHER): Payer: BLUE CROSS/BLUE SHIELD | Admitting: Internal Medicine

## 2016-12-22 ENCOUNTER — Encounter: Payer: Self-pay | Admitting: Internal Medicine

## 2016-12-22 ENCOUNTER — Ambulatory Visit: Payer: BLUE CROSS/BLUE SHIELD | Admitting: Internal Medicine

## 2016-12-22 ENCOUNTER — Other Ambulatory Visit (INDEPENDENT_AMBULATORY_CARE_PROVIDER_SITE_OTHER): Payer: BLUE CROSS/BLUE SHIELD

## 2016-12-22 DIAGNOSIS — E1169 Type 2 diabetes mellitus with other specified complication: Secondary | ICD-10-CM | POA: Diagnosis not present

## 2016-12-22 DIAGNOSIS — I10 Essential (primary) hypertension: Secondary | ICD-10-CM

## 2016-12-22 DIAGNOSIS — E538 Deficiency of other specified B group vitamins: Secondary | ICD-10-CM

## 2016-12-22 DIAGNOSIS — E669 Obesity, unspecified: Secondary | ICD-10-CM | POA: Diagnosis not present

## 2016-12-22 LAB — BASIC METABOLIC PANEL
BUN: 17 mg/dL (ref 6–23)
CALCIUM: 10 mg/dL (ref 8.4–10.5)
CO2: 29 meq/L (ref 19–32)
Chloride: 102 mEq/L (ref 96–112)
Creatinine, Ser: 1.05 mg/dL (ref 0.40–1.20)
GFR: 67.95 mL/min (ref >60.00)
Glucose, Bld: 118 mg/dL — ABNORMAL HIGH (ref 70–99)
Potassium: 3.6 mEq/L (ref 3.5–5.1)
SODIUM: 139 meq/L (ref 135–145)

## 2016-12-22 LAB — HEMOGLOBIN A1C: HEMOGLOBIN A1C: 6.5 % (ref 4.6–6.5)

## 2016-12-22 MED ORDER — INSULIN PEN NEEDLE 31G X 5 MM MISC
1.0000 [IU] | Freq: Four times a day (QID) | 11 refills | Status: DC
Start: 2016-12-22 — End: 2018-12-03

## 2016-12-22 NOTE — Assessment & Plan Note (Signed)
Doing well Wt Readings from Last 3 Encounters:  12/22/16 224 lb (101.6 kg)  04/13/16 237 lb (107.5 kg)  03/08/16 236 lb 9.6 oz (107.3 kg)

## 2016-12-22 NOTE — Patient Instructions (Signed)
Compression socks, Aspirin for travel

## 2016-12-22 NOTE — Assessment & Plan Note (Signed)
Labs

## 2016-12-22 NOTE — Assessment & Plan Note (Signed)
On B12 

## 2016-12-22 NOTE — Assessment & Plan Note (Signed)
Rare Toujeo after wt loss On Metformin Labs

## 2016-12-22 NOTE — Progress Notes (Signed)
Subjective:  Patient ID: Sierra Johnson, female    DOB: 20-Feb-1951  Age: 66 y.o. MRN: [id number]  CC: No chief complaint on file.   HPI Sierra Johnson presents for DM2, HTN, obesity f/u. Not using insulin regular after wt loss  Outpatient Medications Prior to Visit  Medication Sig Dispense Refill  . albuterol (PROVENTIL HFA;VENTOLIN HFA) 108 (90 Base) MCG/ACT inhaler Inhale 2 puffs into the lungs every 4 (four) hours as needed for wheezing or shortness of breath. Dispense with aerochamber 1 Inhaler 0  . BD ULTRA-FINE PEN NEEDLES 29G X 12.7MM MISC USE DAILY 100 each 3  . Cholecalciferol (VITAMIN D3) 1000 UNITS CAPS Take 1 capsule by mouth daily.     . fish oil-omega-3 fatty acids 1000 MG capsule Take 1 capsule by mouth 2 (two) times daily.      [name] glucose blood (FREESTYLE INSULINX TEST) test strip 1 each by Other route 2 (two) times daily. Use to check blood sugars twice a day 200 each 1  . glucose monitoring kit (FREESTYLE) monitoring kit 1 each by Does not apply route 2 (two) times daily. Dx: E11.9, I10 1 each 0  . ibuprofen (ADVIL,MOTRIN) 600 MG tablet Take 1 tablet (600 mg total) by mouth 2 (two) times daily as needed for moderate pain. 60 tablet 3  . Insulin Glargine (TOUJEO SOLOSTAR) 300 UNIT/ML SOPN Inject 18 Units into the skin every morning. 12 pen 1  . Lancets (FREESTYLE) lancets 1 each by Other route 2 (two) times daily. Dx: E11.9, I10 100 each 11  . metFORMIN (GLUCOPHAGE) 500 MG tablet TAKE 1 TABLET BY MOUTH 3 TIMES DAILY 270 tablet 2  . Spacer/Aero-Holding Chambers (AEROCHAMBER PLUS) inhaler Use as instructed 1 each 2  . triamterene-hydrochlorothiazide (MAXZIDE-25) 37.5-25 MG tablet TAKE 1 TABLET BY MOUTH DAILY. 30 tablet 11  . vitamin B-12 (CYANOCOBALAMIN) 1000 MCG tablet Take 1,000 mcg by mouth daily.      [name] glucose blood (FREESTYLE INSULINX TEST) test strip Use to check blood sugars twice a day Dx E11.9 200 each 1   No facility-administered medications prior to visit.      ROS Review of Systems  Constitutional: Negative for activity change, appetite change, chills, fatigue and unexpected weight change.  HENT: Negative for congestion, mouth sores and sinus pressure.   Eyes: Negative for visual disturbance.  Respiratory: Negative for cough and chest tightness.   Gastrointestinal: Negative for abdominal pain and nausea.  Genitourinary: Negative for difficulty urinating, frequency and vaginal pain.  Musculoskeletal: Negative for back pain and gait problem.  Skin: Negative for pallor and rash.  Neurological: Negative for dizziness, tremors, weakness, numbness and headaches.  Psychiatric/Behavioral: Negative for confusion and sleep disturbance.    Objective:  BP 130/78 (BP Location: Left Arm, Patient Position: Sitting, Cuff Size: Large)   Pulse 65   Temp 97.9 F (36.6 C) (Oral)   Ht 5' (1.524 m)   Wt 224 lb (101.6 kg)   SpO2 97%   BMI 43.75 kg/m   BP Readings from Last 3 Encounters:  12/22/16 130/78  08/06/16 146/75  04/13/16 130/72    Wt Readings from Last 3 Encounters:  12/22/16 224 lb (101.6 kg)  04/13/16 237 lb (107.5 kg)  03/08/16 236 lb 9.6 oz (107.3 kg)    Physical Exam  Constitutional: She appears well-developed. No distress.  HENT:  Head: Normocephalic.  Right Ear: External ear normal.  Left Ear: External ear normal.  Nose: Nose normal.  Mouth/Throat: Oropharynx is clear and moist.  Eyes: Conjunctivae are normal. Pupils are equal, round, and reactive to light. Right eye exhibits no discharge. Left eye exhibits no discharge.  Neck: Normal range of motion. Neck supple. No JVD present. No tracheal deviation present. No thyromegaly present.  Cardiovascular: Normal rate, regular rhythm and normal heart sounds.   Pulmonary/Chest: No stridor. No respiratory distress. She has no wheezes.  Abdominal: Soft. Bowel sounds are normal. She exhibits no distension and no mass. There is no tenderness. There is no rebound and no guarding.   Musculoskeletal: She exhibits no edema or tenderness.  Lymphadenopathy:    She has no cervical adenopathy.  Neurological: She displays normal reflexes. No cranial nerve deficit. She exhibits normal muscle tone. Coordination normal.  Skin: No rash noted. No erythema.  Psychiatric: She has a normal mood and affect. Her behavior is normal. Judgment and thought content normal.  R knee hurts w/ROM  Lab Results  Component Value Date   WBC 10.3 04/13/2016   HGB 12.8 04/13/2016   HCT 38.3 04/13/2016   PLT 365.0 04/13/2016   GLUCOSE 108 (H) 04/13/2016   CHOL 239 (H) 04/13/2016   TRIG 97.0 04/13/2016   HDL 74.20 04/13/2016   LDLDIRECT 119.5 08/27/2012   LDLCALC 145 (H) 04/13/2016   ALT 15 04/13/2016   AST 16 04/13/2016   NA 141 04/13/2016   K 4.5 04/13/2016   CL 103 04/13/2016   CREATININE 1.04 04/13/2016   BUN 15 04/13/2016   CO2 30 04/13/2016   TSH 1.25 04/13/2016   HGBA1C 6.7 (H) 04/13/2016   MICROALBUR 37.4 (H) 04/13/2016    No results found.  Assessment & Plan:   There are no diagnoses linked to this encounter. I am having Ms. Stancil maintain her fish oil-omega-3 fatty acids, vitamin B-12, Vitamin D3, glucose monitoring kit, albuterol, AEROCHAMBER PLUS, triamterene-hydrochlorothiazide, Insulin Glargine, freestyle, ibuprofen, metFORMIN, BD ULTRA-FINE PEN NEEDLES, and glucose blood.  No orders of the defined types were placed in this encounter.    Follow-up: No Follow-up on file.  Walker Kehr, MD

## 2017-03-07 ENCOUNTER — Other Ambulatory Visit: Payer: Self-pay | Admitting: Internal Medicine

## 2017-03-25 ENCOUNTER — Encounter: Payer: Self-pay | Admitting: Internal Medicine

## 2017-03-31 ENCOUNTER — Ambulatory Visit: Payer: BLUE CROSS/BLUE SHIELD

## 2017-04-22 ENCOUNTER — Other Ambulatory Visit: Payer: Self-pay | Admitting: Internal Medicine

## 2017-04-27 ENCOUNTER — Encounter: Payer: Self-pay | Admitting: Internal Medicine

## 2017-04-27 ENCOUNTER — Other Ambulatory Visit (INDEPENDENT_AMBULATORY_CARE_PROVIDER_SITE_OTHER): Payer: BLUE CROSS/BLUE SHIELD

## 2017-04-27 ENCOUNTER — Ambulatory Visit (INDEPENDENT_AMBULATORY_CARE_PROVIDER_SITE_OTHER): Payer: BLUE CROSS/BLUE SHIELD | Admitting: Internal Medicine

## 2017-04-27 DIAGNOSIS — J069 Acute upper respiratory infection, unspecified: Secondary | ICD-10-CM | POA: Insufficient documentation

## 2017-04-27 DIAGNOSIS — E1169 Type 2 diabetes mellitus with other specified complication: Secondary | ICD-10-CM

## 2017-04-27 DIAGNOSIS — Z0000 Encounter for general adult medical examination without abnormal findings: Secondary | ICD-10-CM

## 2017-04-27 DIAGNOSIS — E538 Deficiency of other specified B group vitamins: Secondary | ICD-10-CM | POA: Diagnosis not present

## 2017-04-27 DIAGNOSIS — I10 Essential (primary) hypertension: Secondary | ICD-10-CM | POA: Diagnosis not present

## 2017-04-27 DIAGNOSIS — E669 Obesity, unspecified: Secondary | ICD-10-CM

## 2017-04-27 LAB — URINALYSIS, ROUTINE W REFLEX MICROSCOPIC
Bilirubin Urine: NEGATIVE
HGB URINE DIPSTICK: NEGATIVE
Ketones, ur: NEGATIVE
Leukocytes, UA: NEGATIVE
NITRITE: NEGATIVE
RBC / HPF: NONE SEEN (ref 0–?)
SPECIFIC GRAVITY, URINE: 1.025 (ref 1.000–1.030)
Total Protein, Urine: 30 — AB
Urine Glucose: NEGATIVE
Urobilinogen, UA: 0.2 (ref 0.0–1.0)
pH: 6 (ref 5.0–8.0)

## 2017-04-27 LAB — CBC WITH DIFFERENTIAL/PLATELET
BASOS ABS: 0.1 10*3/uL (ref 0.0–0.1)
Basophils Relative: 0.7 % (ref 0.0–3.0)
Eosinophils Absolute: 0.2 10*3/uL (ref 0.0–0.7)
Eosinophils Relative: 2.6 % (ref 0.0–5.0)
HCT: 38.7 % (ref 36.0–46.0)
Hemoglobin: 12.5 g/dL (ref 12.0–15.0)
LYMPHS PCT: 24.7 % (ref 12.0–46.0)
Lymphs Abs: 2.3 10*3/uL (ref 0.7–4.0)
MCHC: 32.2 g/dL (ref 30.0–36.0)
MCV: 85.6 fl (ref 78.0–100.0)
MONO ABS: 0.9 10*3/uL (ref 0.1–1.0)
Monocytes Relative: 9.7 % (ref 3.0–12.0)
NEUTROS ABS: 5.8 10*3/uL (ref 1.4–7.7)
NEUTROS PCT: 62.3 % (ref 43.0–77.0)
PLATELETS: 374 10*3/uL (ref 150.0–400.0)
RBC: 4.52 Mil/uL (ref 3.87–5.11)
RDW: 15.8 % — AB (ref 11.5–15.5)
WBC: 9.3 10*3/uL (ref 4.0–10.5)

## 2017-04-27 LAB — LIPID PANEL
Cholesterol: 220 mg/dL — ABNORMAL HIGH (ref 0–200)
HDL: 79.8 mg/dL (ref >39.00)
LDL Cholesterol: 128 mg/dL — ABNORMAL HIGH (ref 0–99)
NONHDL: 140.28
TRIGLYCERIDES: 60 mg/dL (ref 0.0–149.0)
Total CHOL/HDL Ratio: 3
VLDL: 12 mg/dL (ref 0.0–40.0)

## 2017-04-27 LAB — HEMOGLOBIN A1C: HEMOGLOBIN A1C: 6.6 % — AB (ref 4.6–6.5)

## 2017-04-27 LAB — BASIC METABOLIC PANEL
BUN: 19 mg/dL (ref 6–23)
CALCIUM: 9.6 mg/dL (ref 8.4–10.5)
CO2: 30 meq/L (ref 19–32)
CREATININE: 1.04 mg/dL (ref 0.40–1.20)
Chloride: 101 mEq/L (ref 96–112)
GFR: 68.63 mL/min (ref >60.00)
Glucose, Bld: 103 mg/dL — ABNORMAL HIGH (ref 70–99)
Potassium: 4.4 mEq/L (ref 3.5–5.1)
Sodium: 138 mEq/L (ref 135–145)

## 2017-04-27 LAB — VITAMIN B12: VITAMIN B 12: 966 pg/mL — AB (ref 211–911)

## 2017-04-27 LAB — HEPATIC FUNCTION PANEL
ALBUMIN: 3.9 g/dL (ref 3.5–5.2)
ALK PHOS: 64 U/L (ref 39–117)
ALT: 23 U/L (ref 0–35)
AST: 19 U/L (ref 0–37)
Bilirubin, Direct: 0 mg/dL (ref 0.0–0.3)
Total Bilirubin: 0.4 mg/dL (ref 0.2–1.2)
Total Protein: 7.6 g/dL (ref 6.0–8.3)

## 2017-04-27 LAB — TSH: TSH: 1.41 u[IU]/mL (ref 0.35–4.50)

## 2017-04-27 LAB — VITAMIN D 25 HYDROXY (VIT D DEFICIENCY, FRACTURES): VITD: 54.7 ng/mL (ref 30.00–100.00)

## 2017-04-27 LAB — MICROALBUMIN / CREATININE URINE RATIO
Creatinine,U: 96.7 mg/dL
MICROALB UR: 37.9 mg/dL — AB (ref 0.0–1.9)
MICROALB/CREAT RATIO: 39.3 mg/g — AB (ref 0.0–30.0)

## 2017-04-27 MED ORDER — AZITHROMYCIN 250 MG PO TABS: ORAL_TABLET | ORAL | 0 refills | Status: DC

## 2017-04-27 NOTE — Progress Notes (Signed)
Subjective:  Patient ID: Sierra Johnson, female    DOB: 13-Nov-1950  Age: 66 y.o. MRN: [id number]  CC: No chief complaint on file.   HPI Sierra Johnson presents for a well exam F/u DM, HTN C/o URI x 1 week  Outpatient Medications Prior to Visit  Medication Sig Dispense Refill  . albuterol (PROVENTIL HFA;VENTOLIN HFA) 108 (90 Base) MCG/ACT inhaler Inhale 2 puffs into the lungs every 4 (four) hours as needed for wheezing or shortness of breath. Dispense with aerochamber 1 Inhaler 0  . Cholecalciferol (VITAMIN D3) 1000 UNITS CAPS Take 1 capsule by mouth daily.     . fish oil-omega-3 fatty acids 1000 MG capsule Take 1 capsule by mouth 2 (two) times daily.      [name] glucose blood (FREESTYLE INSULINX TEST) test strip 1 each by Other route 2 (two) times daily. Use to check blood sugars twice a day 200 each 1  . glucose monitoring kit (FREESTYLE) monitoring kit 1 each by Does not apply route 2 (two) times daily. Dx: E11.9, I10 1 each 0  . ibuprofen (ADVIL,MOTRIN) 600 MG tablet TAKE 1 TABLET (600 MG TOTAL) BY MOUTH 2 (TWO) TIMES DAILY AS NEEDED FOR MODERATE PAIN. 60 tablet 3  . Insulin Glargine (TOUJEO SOLOSTAR) 300 UNIT/ML SOPN Inject 18 Units into the skin every morning. 12 pen 1  . Insulin Pen Needle 31G X 5 MM MISC 1 Units by Does not apply route 4 (four) times daily. 50 each 11  . Lancets (FREESTYLE) lancets 1 each by Other route 2 (two) times daily. Dx: E11.9, I10 100 each 11  . metFORMIN (GLUCOPHAGE) 500 MG tablet TAKE 1 TABLET BY MOUTH 3 TIMES DAILY 270 tablet 2  . Spacer/Aero-Holding Chambers (AEROCHAMBER PLUS) inhaler Use as instructed 1 each 2  . triamterene-hydrochlorothiazide (MAXZIDE-25) 37.5-25 MG tablet TAKE 1 TABLET BY MOUTH DAILY. 30 tablet 11  . vitamin B-12 (CYANOCOBALAMIN) 1000 MCG tablet Take 1,000 mcg by mouth daily.       No facility-administered medications prior to visit.     ROS Review of Systems  Constitutional: Positive for fatigue. Negative for activity change,  appetite change, chills and unexpected weight change.  HENT: Positive for congestion and sore throat. Negative for mouth sores and sinus pressure.   Eyes: Negative for visual disturbance.  Respiratory: Positive for cough. Negative for chest tightness.   Gastrointestinal: Negative for abdominal pain and nausea.  Genitourinary: Negative for difficulty urinating, frequency and vaginal pain.  Musculoskeletal: Negative for back pain and gait problem.  Skin: Negative for pallor and rash.  Neurological: Negative for dizziness, tremors, weakness, numbness and headaches.  Psychiatric/Behavioral: Negative for confusion and sleep disturbance.    Objective:  BP 128/82 (BP Location: Left Arm, Patient Position: Sitting, Cuff Size: Large)   Pulse 70   Temp 98.1 F (36.7 C) (Oral)   Ht 5' (1.524 m)   Wt 225 lb (102.1 kg)   SpO2 98%   BMI 43.94 kg/m   BP Readings from Last 3 Encounters:  04/27/17 128/82  12/22/16 130/78  08/06/16 146/75    Wt Readings from Last 3 Encounters:  04/27/17 225 lb (102.1 kg)  12/22/16 224 lb (101.6 kg)  04/13/16 237 lb (107.5 kg)    Physical Exam  Constitutional: She appears well-developed. No distress.  HENT:  Head: Normocephalic.  Right Ear: External ear normal.  Left Ear: External ear normal.  Nose: Nose normal.  Mouth/Throat: Oropharynx is clear and moist.  Eyes: Conjunctivae are normal. Pupils are  equal, round, and reactive to light. Right eye exhibits no discharge. Left eye exhibits no discharge.  Neck: Normal range of motion. Neck supple. No JVD present. No tracheal deviation present. No thyromegaly present.  Cardiovascular: Normal rate, regular rhythm and normal heart sounds.  Pulmonary/Chest: No stridor. No respiratory distress. She has no wheezes.  Abdominal: Soft. Bowel sounds are normal. She exhibits no distension and no mass. There is no tenderness. There is no rebound and no guarding.  Musculoskeletal: She exhibits no edema or tenderness.    Lymphadenopathy:    She has no cervical adenopathy.  Neurological: She displays normal reflexes. No cranial nerve deficit. She exhibits normal muscle tone. Coordination normal.  Skin: No rash noted. No erythema.  Psychiatric: She has a normal mood and affect. Her behavior is normal. Judgment and thought content normal.  obese  Lab Results  Component Value Date   WBC 10.3 04/13/2016   HGB 12.8 04/13/2016   HCT 38.3 04/13/2016   PLT 365.0 04/13/2016   GLUCOSE 118 (H) 12/22/2016   CHOL 239 (H) 04/13/2016   TRIG 97.0 04/13/2016   HDL 74.20 04/13/2016   LDLDIRECT 119.5 08/27/2012   LDLCALC 145 (H) 04/13/2016   ALT 15 04/13/2016   AST 16 04/13/2016   NA 139 12/22/2016   K 3.6 12/22/2016   CL 102 12/22/2016   CREATININE 1.05 12/22/2016   BUN 17 12/22/2016   CO2 29 12/22/2016   TSH 1.25 04/13/2016   HGBA1C 6.5 12/22/2016   MICROALBUR 37.4 (H) 04/13/2016    No results found.  Assessment & Plan:   There are no diagnoses linked to this encounter. I am having Sierra Johnson maintain her fish oil-omega-3 fatty acids, vitamin B-12, Vitamin D3, glucose monitoring kit, albuterol, AEROCHAMBER PLUS, Insulin Glargine, freestyle, metFORMIN, glucose blood, Insulin Pen Needle, triamterene-hydrochlorothiazide, and ibuprofen.  No orders of the defined types were placed in this encounter.    Follow-up: No Follow-up on file.  Walker Kehr, MD

## 2017-04-27 NOTE — Assessment & Plan Note (Signed)
Labs

## 2017-04-27 NOTE — Assessment & Plan Note (Addendum)
We discussed age appropriate health related issues, including available/recomended screening tests and vaccinations. We discussed a need for adhering to healthy diet and exercise. Labs were ordered to be later reviewed . All questions were answered. Colon 2008 Cologuard 2018 Shingrix, pneumovax discussed GYN and mammo yearly

## 2017-04-27 NOTE — Assessment & Plan Note (Signed)
Zpac 

## 2017-04-27 NOTE — Patient Instructions (Signed)
You can use over-the-counter  "cold" medicines  such as "Afrin" nasal spray for nasal congestion as directed. Use " Delsym" or" Robitussin" cough syrup varietis for cough.  You can use plain "Tylenol" or "Advil" for fever, chills and achyness. Use Halls or Ricola cough drops.   "Common cold" symptoms are usually triggered by a virus.  The antibiotics are usually not necessary. On average, a" viral cold" illness would take 4-7 days to resolve.   Please, make an appointment if you are not better or if you're worse.  

## 2017-04-27 NOTE — Assessment & Plan Note (Signed)
On B12 

## 2017-05-06 ENCOUNTER — Other Ambulatory Visit: Payer: Self-pay | Admitting: Internal Medicine

## 2017-05-21 ENCOUNTER — Emergency Department (HOSPITAL_COMMUNITY)
Admission: EM | Admit: 2017-05-21 | Discharge: 2017-05-21 | Discharge status: Absent without leave | Payer: BLUE CROSS/BLUE SHIELD

## 2017-06-03 ENCOUNTER — Other Ambulatory Visit: Payer: Self-pay | Admitting: Internal Medicine

## 2017-06-05 ENCOUNTER — Ambulatory Visit (INDEPENDENT_AMBULATORY_CARE_PROVIDER_SITE_OTHER): Payer: BLUE CROSS/BLUE SHIELD

## 2017-06-05 DIAGNOSIS — Z23 Encounter for immunization: Secondary | ICD-10-CM

## 2017-06-05 LAB — HM DIABETES EYE EXAM

## 2017-06-26 ENCOUNTER — Encounter: Payer: Self-pay | Admitting: Internal Medicine

## 2017-06-26 NOTE — Progress Notes (Signed)
Outside notes received. Information abstracted. Notes sent to scan.  

## 2017-07-02 ENCOUNTER — Encounter: Payer: Self-pay | Admitting: Internal Medicine

## 2017-08-10 ENCOUNTER — Ambulatory Visit (INDEPENDENT_AMBULATORY_CARE_PROVIDER_SITE_OTHER): Payer: BLUE CROSS/BLUE SHIELD

## 2017-08-10 DIAGNOSIS — Z299 Encounter for prophylactic measures, unspecified: Secondary | ICD-10-CM

## 2017-09-18 ENCOUNTER — Telehealth: Payer: Self-pay | Admitting: Pulmonary Disease

## 2017-09-18 NOTE — Telephone Encounter (Signed)
Called number given for [place]. Unable to reach anyone it was a fax machine number. Called patient to see if she had any information on this, unable to reach left message to give [place] a call back.

## 2017-09-19 NOTE — Telephone Encounter (Signed)
ATC x2  

## 2017-09-20 NOTE — Telephone Encounter (Signed)
lmtcb for pt.  

## 2017-09-20 NOTE — Telephone Encounter (Signed)
Spoke with Magda Paganini, she is requesting a form that that was faxed over on the behalf of pt for a traveling CPAP. I asked her to fax it again so we can see if we can help. Pt has not been seen here since 02/2016. Will await fax.

## 2017-09-20 NOTE — Telephone Encounter (Signed)
ATC Lesli, no answer. Left message for her to call back.

## 2017-09-20 NOTE — Telephone Encounter (Signed)
Magda Paganini from [employer] calling for an update on the requested paperwork. Cb is [phone]

## 2017-09-20 NOTE — Telephone Encounter (Signed)
[name] is calling back [phone]

## 2017-09-21 NOTE — Telephone Encounter (Signed)
Sierra Johnson from [employer] calling to check the status of the form that was faxed over yesterday. CB is [phone]

## 2017-09-21 NOTE — Telephone Encounter (Signed)
Cherina do you know if this form has been received? Thanks!

## 2017-09-24 NOTE — Telephone Encounter (Signed)
Pt is calling back [phone]

## 2017-09-24 NOTE — Telephone Encounter (Signed)
I spoke with Sierra Johnson and fax was received. However, patient needs an office visit before any further actions are taken. Patient has not been seen in office since 9.20.2017.  LMTCB x1 on preferred phone number listed for patient.

## 2017-09-24 NOTE — Telephone Encounter (Signed)
Called and spoke to patient. Advised patient that she will need an OV before being able to get Rx for travel CPAP. Scheduled patient appointment for Monday 10/01/17 at 0930. Nothing further needed at this time.

## 2017-09-26 ENCOUNTER — Telehealth: Payer: Self-pay | Admitting: Pulmonary Disease

## 2017-09-26 NOTE — Telephone Encounter (Signed)
Spoke with Magda Paganini. Advised her that the patient is overdue for a OSA follow-up and RA will not release any information until after her appt on Monday.   She verbalized understanding. Nothing else needed at time of call.   I have the form in my folder with Monday's chart for reference.

## 2017-10-01 ENCOUNTER — Encounter: Payer: Self-pay | Admitting: Pulmonary Disease

## 2017-10-01 ENCOUNTER — Ambulatory Visit (INDEPENDENT_AMBULATORY_CARE_PROVIDER_SITE_OTHER): Payer: BLUE CROSS/BLUE SHIELD | Admitting: Pulmonary Disease

## 2017-10-01 VITALS — BP 124/78 | HR 61 | Ht 60.0 in | Wt 222.6 lb

## 2017-10-01 DIAGNOSIS — G4733 Obstructive sleep apnea (adult) (pediatric): Secondary | ICD-10-CM | POA: Diagnosis not present

## 2017-10-01 NOTE — Assessment & Plan Note (Signed)
Weight loss encouraged 

## 2017-10-01 NOTE — Progress Notes (Signed)
   Subjective:    Patient ID: Sierra Johnson, female    DOB: 10/08/1950, 67 y.o.   MRN: [id number]  HPI  66/F for followup of OSA. She also has issues with sleep maintenance insomnia  She has been maintained on CPAP for about 9 years.  On her last visit in 2017, we got her new CPAP machine and this is working well.  CPAP is certainly helped improve her daytime somnolence and fatigue. She uses nasal pillows and seems like this, pressure is adequate.  She denies dryness or sleep pressure in the daytime.  She is able to function well. CPAP download was reviewed and this shows excellent control of events on 12 cm with good compliance and minimal leak. She is traveling on a trip to [place] in December and would like a portable machine  Review of Systems Patient denies significant dyspnea,cough, hemoptysis,  chest pain, palpitations, pedal edema, orthopnea, paroxysmal nocturnal dyspnea, lightheadedness, nausea, vomiting, abdominal or  leg pains      Objective:   Physical Exam  Gen. Pleasant, obese, in no distress ENT - no lesions, no post nasal drip Neck: No JVD, no thyromegaly, no carotid bruits Lungs: no use of accessory muscles, no dullness to percussion, decreased without rales or rhonchi  Cardiovascular: Rhythm regular, heart sounds  normal, no murmurs or gallops, no peripheral edema Musculoskeletal: No deformities, no cyanosis or clubbing , no tremors       Assessment & Plan:

## 2017-10-01 NOTE — Assessment & Plan Note (Signed)
We will send in prescription for portable CPAP 12 cm CPAP supplies will be renewed for a year  Weight loss encouraged, compliance with goal of at least 4-6 hrs every night is the expectation. Advised against medications with sedative side effects Cautioned against driving when sleepy - understanding that sleepiness will vary on a day to day basis

## 2017-10-01 NOTE — Patient Instructions (Signed)
We will send in prescription for portable CPAP 12 cm CPAP supplies will be renewed for a year

## 2018-01-17 ENCOUNTER — Ambulatory Visit: Payer: Self-pay | Admitting: Internal Medicine

## 2018-01-17 NOTE — Telephone Encounter (Signed)
noted 

## 2018-01-17 NOTE — Telephone Encounter (Signed)
I returned her call.   She is c/o having a mostly non productive cough for 2 weeks.   It's worse at night and causing her difficulty with sleeping.  Denies any other symptoms.   "I have allergies".  Been using OTC medications without relief.  "He usually gives me cough medicine with codeine when I've had this in the past".  See triage notes.  I scheduled her with Dr. Alain Marion for 01/18/18 at 1:40.      Reason for Disposition . [1] Continuous (nonstop) coughing interferes with work or school AND [2] no improvement using cough treatment per Care Advice  Answer Assessment - Initial Assessment Questions 1. ONSET: "When did the cough begin?"      2 weeks ago it started.  I've been using Claritin and Nyquill and Mucinex during the day.   2. SEVERITY: "How bad is the cough today?"      It's not getting any better.   I'm coughing a lot.   Mostly a dry cough buy sometimes I cough up something. 3. RESPIRATORY DISTRESS: "Describe your breathing."      No shortness of breath.   Only symptoms is a dry cough.   Coughing all night.   Not sleeping. 4. FEVER: "Do you have a fever?" If so, ask: "What is your temperature, how was it measured, and when did it start?"     No 5. SPUTUM: "Describe the color of your sputum" (clear, white, yellow, green)     Clear.   I have allergies 6. HEMOPTYSIS: "Are you coughing up any blood?" If so ask: "How much?" (flecks, streaks, tablespoons, etc.)     No blood 7. CARDIAC HISTORY: "Do you have any history of heart disease?" (e.g., heart attack, congestive heart failure)      No 8. LUNG HISTORY: "Do you have any history of lung disease?"  (e.g., pulmonary embolus, asthma, emphysema)     No 9. PE RISK FACTORS: "Do you have a history of blood clots?" (or: recent major surgery, recent prolonged travel, bedridden)     No 10. OTHER SYMPTOMS: "Do you have any other symptoms?" (e.g., runny nose, wheezing, chest pain)       No other symptoms just coughing. 11. PREGNANCY: "Is  there any chance you are pregnant?" "When was your last menstrual period?"       Not asked due to age 42. TRAVEL: "Have you traveled out of the country in the last month?" (e.g., travel history, exposures)       No travels or exposures.  Protocols used: COUGH - ACUTE PRODUCTIVE-A-AH

## 2018-01-18 ENCOUNTER — Encounter: Payer: Self-pay | Admitting: Internal Medicine

## 2018-01-18 ENCOUNTER — Ambulatory Visit (INDEPENDENT_AMBULATORY_CARE_PROVIDER_SITE_OTHER): Payer: BLUE CROSS/BLUE SHIELD | Admitting: Internal Medicine

## 2018-01-18 DIAGNOSIS — E669 Obesity, unspecified: Secondary | ICD-10-CM

## 2018-01-18 DIAGNOSIS — E1169 Type 2 diabetes mellitus with other specified complication: Secondary | ICD-10-CM

## 2018-01-18 DIAGNOSIS — J4521 Mild intermittent asthma with (acute) exacerbation: Secondary | ICD-10-CM | POA: Diagnosis not present

## 2018-01-18 DIAGNOSIS — J069 Acute upper respiratory infection, unspecified: Secondary | ICD-10-CM | POA: Diagnosis not present

## 2018-01-18 DIAGNOSIS — J45909 Unspecified asthma, uncomplicated: Secondary | ICD-10-CM | POA: Insufficient documentation

## 2018-01-18 MED ORDER — AZITHROMYCIN 250 MG PO TABS: ORAL_TABLET | ORAL | 0 refills | Status: DC

## 2018-01-18 MED ORDER — FLUTICASONE FUROATE-VILANTEROL 100-25 MCG/INH IN AEPB: 1.0000 | INHALATION_SPRAY | Freq: Every day | RESPIRATORY_TRACT | 5 refills | Status: DC

## 2018-01-18 MED ORDER — PROMETHAZINE-CODEINE 6.25-10 MG/5ML PO SYRP: 5.0000 mL | ORAL_SOLUTION | ORAL | 0 refills | Status: DC | PRN

## 2018-01-18 NOTE — Progress Notes (Signed)
Subjective:  Patient ID: Sierra Johnson, female    DOB: 02-10-1951  Age: 67 y.o. MRN: [id number]  CC: No chief complaint on file.   HPI TEALE GOODGAME presents for cough x 2 weeks  Outpatient Medications Prior to Visit  Medication Sig Dispense Refill  . albuterol (PROVENTIL HFA;VENTOLIN HFA) 108 (90 Base) MCG/ACT inhaler Inhale 2 puffs into the lungs every 4 (four) hours as needed for wheezing or shortness of breath. Dispense with aerochamber 1 Inhaler 0  . Cholecalciferol (VITAMIN D3) 1000 UNITS CAPS Take 1 capsule by mouth daily.     . fish oil-omega-3 fatty acids 1000 MG capsule Take 1 capsule by mouth 2 (two) times daily.      [name] glucose blood (FREESTYLE INSULINX TEST) test strip 1 each by Other route 2 (two) times daily. Use to check blood sugars twice a day 200 each 1  . glucose monitoring kit (FREESTYLE) monitoring kit 1 each by Does not apply route 2 (two) times daily. Dx: E11.9, I10 1 each 0  . ibuprofen (ADVIL,MOTRIN) 600 MG tablet TAKE 1 TABLET (600 MG TOTAL) BY MOUTH 2 (TWO) TIMES DAILY AS NEEDED FOR MODERATE PAIN. 60 tablet 3  . Insulin Glargine (TOUJEO SOLOSTAR) 300 UNIT/ML SOPN Inject 18 Units into the skin every morning. 12 pen 1  . Insulin Pen Needle 31G X 5 MM MISC 1 Units by Does not apply route 4 (four) times daily. 50 each 11  . Lancets (FREESTYLE) lancets 1 each by Other route 2 (two) times daily. Dx: E11.9, I10 100 each 11  . metFORMIN (GLUCOPHAGE) 500 MG tablet TAKE 1 TABLET BY MOUTH 3 TIMES DAILY 270 tablet 3  . Spacer/Aero-Holding Chambers (AEROCHAMBER PLUS) inhaler Use as instructed 1 each 2  . TOUJEO SOLOSTAR 300 UNIT/ML SOPN INJECT 18 UNITS INTO THE SKIN EVERY MORNING. 18 pen 1  . triamterene-hydrochlorothiazide (MAXZIDE-25) 37.5-25 MG tablet TAKE 1 TABLET BY MOUTH DAILY. 30 tablet 11  . vitamin B-12 (CYANOCOBALAMIN) 1000 MCG tablet Take 1,000 mcg by mouth daily.       No facility-administered medications prior to visit.     ROS: Review of Systems    Constitutional: Negative for activity change, appetite change, chills, fatigue and unexpected weight change.  HENT: Negative for congestion, mouth sores and sinus pressure.   Eyes: Negative for visual disturbance.  Respiratory: Positive for cough. Negative for chest tightness, shortness of breath and wheezing.   Gastrointestinal: Negative for abdominal pain and nausea.  Genitourinary: Negative for difficulty urinating, frequency and vaginal pain.  Musculoskeletal: Negative for back pain and gait problem.  Skin: Negative for pallor and rash.  Neurological: Negative for dizziness, tremors, weakness, numbness and headaches.  Psychiatric/Behavioral: Negative for confusion and sleep disturbance.    Objective:  BP 124/82 (BP Location: Left Arm, Patient Position: Sitting, Cuff Size: Large)   Pulse 80   Temp 98.6 F (37 C) (Oral)   Ht 5' (1.524 m)   Wt 229 lb (103.9 kg)   SpO2 97%   BMI 44.72 kg/m   BP Readings from Last 3 Encounters:  01/18/18 124/82  10/01/17 124/78  04/27/17 128/82    Wt Readings from Last 3 Encounters:  01/18/18 229 lb (103.9 kg)  10/01/17 222 lb 9.6 oz (101 kg)  04/27/17 225 lb (102.1 kg)    Physical Exam  Constitutional: She appears well-developed. No distress.  HENT:  Head: Normocephalic.  Right Ear: External ear normal.  Left Ear: External ear normal.  Nose: Nose normal.  Mouth/Throat:  Oropharynx is clear and moist.  Eyes: Pupils are equal, round, and reactive to light. Conjunctivae are normal. Right eye exhibits no discharge. Left eye exhibits no discharge.  Neck: Normal range of motion. Neck supple. No JVD present. No tracheal deviation present. No thyromegaly present.  Cardiovascular: Normal rate, regular rhythm and normal heart sounds.  Pulmonary/Chest: No stridor. No respiratory distress. She has no wheezes.  Abdominal: Soft. Bowel sounds are normal. She exhibits no distension and no mass. There is no tenderness. There is no rebound and no  guarding.  Musculoskeletal: She exhibits no edema or tenderness.  Lymphadenopathy:    She has no cervical adenopathy.  Neurological: She displays normal reflexes. No cranial nerve deficit. She exhibits normal muscle tone. Coordination normal.  Skin: No rash noted. No erythema.  Psychiatric: She has a normal mood and affect. Her behavior is normal. Judgment and thought content normal.   I personally provided Breo inhaler use teaching. After the teaching patient was able to demonstrate it's use effectively. All questions were answered  Lab Results  Component Value Date   WBC 9.3 04/27/2017   HGB 12.5 04/27/2017   HCT 38.7 04/27/2017   PLT 374.0 04/27/2017   GLUCOSE 103 (H) 04/27/2017   CHOL 220 (H) 04/27/2017   TRIG 60.0 04/27/2017   HDL 79.80 04/27/2017   LDLDIRECT 119.5 08/27/2012   LDLCALC 128 (H) 04/27/2017   ALT 23 04/27/2017   AST 19 04/27/2017   NA 138 04/27/2017   K 4.4 04/27/2017   CL 101 04/27/2017   CREATININE 1.04 04/27/2017   BUN 19 04/27/2017   CO2 30 04/27/2017   TSH 1.41 04/27/2017   HGBA1C 6.6 (H) 04/27/2017   MICROALBUR 37.9 (H) 04/27/2017    No results found.  Assessment & Plan:   There are no diagnoses linked to this encounter.   No orders of the defined types were placed in this encounter.    Follow-up: No follow-ups on file.  Walker Kehr, MD

## 2018-01-18 NOTE — Assessment & Plan Note (Signed)
Metformin 

## 2018-01-18 NOTE — Assessment & Plan Note (Signed)
Zpac if worse 

## 2018-01-18 NOTE — Assessment & Plan Note (Signed)
Prom-Cod Breo qd

## 2018-01-20 ENCOUNTER — Encounter (HOSPITAL_COMMUNITY): Payer: Self-pay | Admitting: Emergency Medicine

## 2018-01-20 ENCOUNTER — Emergency Department (HOSPITAL_COMMUNITY): Payer: BLUE CROSS/BLUE SHIELD

## 2018-01-20 ENCOUNTER — Emergency Department (HOSPITAL_COMMUNITY)
Admission: EM | Admit: 2018-01-20 | Discharge: 2018-01-20 | Disposition: A | Discharge status: Home / Self Care | Payer: BLUE CROSS/BLUE SHIELD | Attending: Emergency Medicine | Admitting: Emergency Medicine

## 2018-01-20 DIAGNOSIS — Z79899 Other long term (current) drug therapy: Secondary | ICD-10-CM | POA: Diagnosis not present

## 2018-01-20 DIAGNOSIS — Z794 Long term (current) use of insulin: Secondary | ICD-10-CM | POA: Insufficient documentation

## 2018-01-20 DIAGNOSIS — E119 Type 2 diabetes mellitus without complications: Secondary | ICD-10-CM | POA: Diagnosis not present

## 2018-01-20 DIAGNOSIS — J00 Acute nasopharyngitis [common cold]: Secondary | ICD-10-CM | POA: Insufficient documentation

## 2018-01-20 DIAGNOSIS — R05 Cough: Secondary | ICD-10-CM | POA: Diagnosis present

## 2018-01-20 DIAGNOSIS — H66001 Acute suppurative otitis media without spontaneous rupture of ear drum, right ear: Secondary | ICD-10-CM | POA: Diagnosis not present

## 2018-01-20 DIAGNOSIS — I10 Essential (primary) hypertension: Secondary | ICD-10-CM | POA: Diagnosis not present

## 2018-01-20 LAB — CBC WITH DIFFERENTIAL/PLATELET
Abs Immature Granulocytes: 0 10*3/uL (ref 0.0–0.1)
BASOS ABS: 0.1 10*3/uL (ref 0.0–0.1)
BASOS PCT: 1 %
EOS ABS: 0.3 10*3/uL (ref 0.0–0.7)
Eosinophils Relative: 3 %
HCT: 38.6 % (ref 36.0–46.0)
Hemoglobin: 12.4 g/dL (ref 12.0–15.0)
IMMATURE GRANULOCYTES: 0 %
Lymphocytes Relative: 23 %
Lymphs Abs: 2.1 10*3/uL (ref 0.7–4.0)
MCH: 28.1 pg (ref 26.0–34.0)
MCHC: 32.1 g/dL (ref 30.0–36.0)
MCV: 87.3 fL (ref 78.0–100.0)
Monocytes Absolute: 1.2 10*3/uL — ABNORMAL HIGH (ref 0.1–1.0)
Monocytes Relative: 13 %
NEUTROS PCT: 60 %
Neutro Abs: 5.4 10*3/uL (ref 1.7–7.7)
PLATELETS: 305 10*3/uL (ref 150–400)
RBC: 4.42 MIL/uL (ref 3.87–5.11)
RDW: 13.8 % (ref 11.5–15.5)
WBC: 9.1 10*3/uL (ref 4.0–10.5)

## 2018-01-20 LAB — COMPREHENSIVE METABOLIC PANEL
ALK PHOS: 73 U/L (ref 38–126)
ALT: 22 U/L (ref 0–44)
AST: 27 U/L (ref 15–41)
Albumin: 3.3 g/dL — ABNORMAL LOW (ref 3.5–5.0)
Anion gap: 12 (ref 5–15)
BILIRUBIN TOTAL: 0.6 mg/dL (ref 0.3–1.2)
BUN: 13 mg/dL (ref 8–23)
CALCIUM: 9.1 mg/dL (ref 8.9–10.3)
CHLORIDE: 97 mmol/L — AB (ref 98–111)
CO2: 23 mmol/L (ref 22–32)
CREATININE: 1.1 mg/dL — AB (ref 0.44–1.00)
GFR, EST NON AFRICAN AMERICAN: 52 mL/min — AB (ref >60)
Glucose, Bld: 111 mg/dL — ABNORMAL HIGH (ref 70–99)
Potassium: 3.6 mmol/L (ref 3.5–5.1)
Sodium: 132 mmol/L — ABNORMAL LOW (ref 135–145)
TOTAL PROTEIN: 8 g/dL (ref 6.5–8.1)

## 2018-01-20 LAB — I-STAT CG4 LACTIC ACID, ED: LACTIC ACID, VENOUS: 0.95 mmol/L (ref 0.5–1.9)

## 2018-01-20 MED ORDER — BENZONATATE 100 MG PO CAPS: 100.0000 mg | ORAL_CAPSULE | Freq: Three times a day (TID) | ORAL | 0 refills | Status: DC

## 2018-01-20 MED ORDER — AZITHROMYCIN 250 MG PO TABS: 250.0000 mg | ORAL_TABLET | Freq: Every day | ORAL | 0 refills | Status: DC

## 2018-01-20 NOTE — Discharge Instructions (Signed)
Take tylenol 2 pills 4 times a day and motrin 4 pills 3 times a day.  Drink plenty of fluids.  Return for worsening shortness of breath, headache, confusion. Follow up with your family doctor.   

## 2018-01-20 NOTE — ED Provider Notes (Signed)
[facility] EMERGENCY DEPARTMENT Provider Note   CSN: [id number] Arrival date & time: 01/20/18  1903     History   Chief Complaint Chief Complaint  Patient presents with  . Cough  . Fever  . Nasal Congestion  . Chills    HPI Sierra Johnson is a 67 y.o. female.  67 yo F with URI-like symptoms for the past couple weeks.  Started having fevers today.  As high as 101.  Called her doctor who suggested to come to the ED to be evaluated for pneumonia.  She has had some right chest wall pain is worse with coughing.  Denies nausea or vomiting denies rash denies tick bite.  Denies sick contacts.  Does smoking.   The history is provided by the patient.  Cough  Associated symptoms include chest pain. Pertinent negatives include no chills, no headaches, no rhinorrhea, no myalgias, no shortness of breath, no wheezing and no eye redness.  Fever   Associated symptoms include chest pain and cough. Pertinent negatives include no vomiting, no congestion and no headaches.  Illness  This is a new problem. The current episode started more than 1 week ago. The problem occurs constantly. The problem has been gradually worsening. Associated symptoms include chest pain. Pertinent negatives include no abdominal pain, no headaches and no shortness of breath. Nothing aggravates the symptoms. Nothing relieves the symptoms. She has tried nothing for the symptoms. The treatment provided no relief.    Past Medical History:  Diagnosis Date  . ANEMIA-NOS 04/24/2007  . DIABETES MELLITUS, TYPE II 04/24/2007  . Elevated glucose 2011  . HYPERLIPIDEMIA 07/17/2007   mild  . HYPERTENSION 04/24/2007  . Menopause 1998   Dr. Gaetano Net  . OBSTRUCTIVE SLEEP APNEA 09/30/2009   on CPAP 2011  . VITAMIN B12 DEFICIENCY 04/24/2007    Patient Active Problem List   Diagnosis Date Noted  . Asthmatic bronchitis 01/18/2018  . Upper respiratory infection 04/27/2017  . Insomnia 03/08/2016  . Eczema 04/13/2015  .  Edema 02/11/2015  . S/P laparoscopic hernia repair 06/03/2014  . Knee osteoarthritis 04/30/2014  . Recurrent ventral incisional hernia 10/16/2012  . S/P repair of ventral hernia 08/28/2012  . Diabetes mellitus type 2 in obese ([place]) 05/07/2012  . Morbid obesity ([place]) 01/18/2012  . Well adult exam 04/17/2011  . Sinusitis, acute 04/17/2011  . SHOULDER PAIN 12/07/2009  . POLYURIA 12/07/2009  . Obstructive sleep apnea 09/30/2009  . FATIGUE 09/15/2009  . PARESTHESIA 06/08/2008  . Dyslipidemia 07/17/2007  . B12 deficiency 04/24/2007  . Anemia 04/24/2007  . Essential hypertension 04/24/2007    Past Surgical History:  Procedure Laterality Date  . ABDOMINAL HYSTERECTOMY    . HERNIA REPAIR  11/11   Incar. midline hernia  . INCISIONAL HERNIA REPAIR N/A 06/03/2014   Procedure: LAPAROSCOPIC REPAIR RECURRENT  INCISIONAL HERNIA;  Surgeon: Georganna Skeans, MD;  Location: [facility];  Service: General;  Laterality: N/A;  . INSERTION OF MESH N/A 06/03/2014   Procedure: INSERTION OF MESH;  Surgeon: Georganna Skeans, MD;  Location: [facility];  Service: General;  Laterality: N/A;     OB History   None      Home Medications    Prior to Admission medications   Medication Sig Start Date End Date Taking? Authorizing Provider  albuterol (PROVENTIL HFA;VENTOLIN HFA) 108 (90 Base) MCG/ACT inhaler Inhale 2 puffs into the lungs every 4 (four) hours as needed for wheezing or shortness of breath. Dispense with aerochamber 12/10/15   Melynda Ripple, MD  azithromycin (ZITHROMAX) 250 MG tablet Take 1 tablet (250 mg total) by mouth daily. Take first 2 tablets together, then 1 every day until finished. 01/20/18   Deno Etienne, DO  benzonatate (TESSALON) 100 MG capsule Take 1 capsule (100 mg total) by mouth every 8 (eight) hours. 01/20/18   Deno Etienne, DO  Cholecalciferol (VITAMIN D3) 1000 UNITS CAPS Take 1 capsule by mouth daily.     [provider]  fish oil-omega-3 fatty acids 1000 MG capsule Take 1 capsule by  mouth 2 (two) times daily.      [provider]  fluticasone furoate-vilanterol (BREO ELLIPTA) 100-25 MCG/INH AEPB Inhale 1 puff into the lungs daily. 01/18/18   Plotnikov, [name], MD  glucose blood (FREESTYLE INSULINX TEST) test strip 1 each by Other route 2 (two) times daily. Use to check blood sugars twice a day 11/10/16   Plotnikov, [name], MD  glucose monitoring kit (FREESTYLE) monitoring kit 1 each by Does not apply route 2 (two) times daily. Dx: E11.9, I10 10/15/15   Plotnikov, [name], MD  ibuprofen (ADVIL,MOTRIN) 600 MG tablet TAKE 1 TABLET (600 MG TOTAL) BY MOUTH 2 (TWO) TIMES DAILY AS NEEDED FOR MODERATE PAIN. 04/23/17   Plotnikov, [name], MD  Insulin Glargine (TOUJEO SOLOSTAR) 300 UNIT/ML SOPN Inject 18 Units into the skin every morning. 03/20/16   Plotnikov, [name], MD  Insulin Pen Needle 31G X 5 MM MISC 1 Units by Does not apply route 4 (four) times daily. 12/22/16   Plotnikov, [name], MD  Lancets (FREESTYLE) lancets 1 each by Other route 2 (two) times daily. Dx: E11.9, I10 04/13/16   Plotnikov, [name], MD  metFORMIN (GLUCOPHAGE) 500 MG tablet TAKE 1 TABLET BY MOUTH 3 TIMES DAILY 05/06/17   Plotnikov, [name], MD  promethazine-codeine (PHENERGAN WITH CODEINE) 6.25-10 MG/5ML syrup Take 5 mLs by mouth every 4 (four) hours as needed. 01/18/18   Plotnikov, [name], MD  Spacer/Aero-Holding Chambers (AEROCHAMBER PLUS) inhaler Use as instructed 12/10/15   Melynda Ripple, MD  TOUJEO SOLOSTAR 300 UNIT/ML SOPN INJECT 18 UNITS INTO THE SKIN EVERY MORNING. 06/04/17   Plotnikov, [name], MD  triamterene-hydrochlorothiazide (MAXZIDE-25) 37.5-25 MG tablet TAKE 1 TABLET BY MOUTH DAILY. 03/07/17   Plotnikov, [name], MD  vitamin B-12 (CYANOCOBALAMIN) 1000 MCG tablet Take 1,000 mcg by mouth daily.      [provider]    Family History Family History  Problem Relation Age of Onset  . Heart disease Mother 20       ?CAD  . Cancer Father        lung  . Heart  disease Brother 102       CAD  . Hypertension Other     Social History Social History   Tobacco Use  . Smoking status: Never Smoker  . Smokeless tobacco: Never Used  Substance Use Topics  . Alcohol use: No  . Drug use: No     Allergies   Amlodipine; Benazepril; and Tetracycline   Review of Systems Review of Systems  Constitutional: Positive for fever. Negative for chills.  HENT: Negative for congestion and rhinorrhea.   Eyes: Negative for redness and visual disturbance.  Respiratory: Positive for cough. Negative for shortness of breath and wheezing.   Cardiovascular: Positive for chest pain. Negative for palpitations.  Gastrointestinal: Negative for abdominal pain, nausea and vomiting.  Genitourinary: Negative for dysuria and urgency.  Musculoskeletal: Negative for arthralgias and myalgias.  Skin: Negative for pallor and wound.  Neurological:  Negative for dizziness and headaches.     Physical Exam Updated Vital Signs BP 134/80 (BP Location: Right Arm)   Pulse 99   Temp (!) 100.4 F (38 C) (Oral)   Resp 19   Ht 5' (1.524 m)   Wt 103.9 kg (229 lb)   SpO2 99%   BMI 44.72 kg/m   Physical Exam  Constitutional: She is oriented to person, place, and time. She appears well-developed and well-nourished. No distress.  HENT:  Head: Normocephalic and atraumatic.  Swollen turbinates, posterior nasal drip, no noted sinus ttp, right TM with a purulent effusion.  There is no change in landmarks.  Eyes: Pupils are equal, round, and reactive to light. EOM are normal.  Neck: Normal range of motion. Neck supple.  Cardiovascular: Normal rate and regular rhythm. Exam reveals no gallop and no friction rub.  No murmur heard. Pulmonary/Chest: Effort normal. She has no wheezes. She has no rales.  Abdominal: Soft. She exhibits no distension. There is no tenderness.  Musculoskeletal: She exhibits no edema or tenderness.  Neurological: She is alert and oriented to person, place, and  time.  Skin: Skin is warm and dry. She is not diaphoretic.  Psychiatric: She has a normal mood and affect. Her behavior is normal.  Nursing note and vitals reviewed.    ED Treatments / Results  Labs (all labs ordered are listed, but only abnormal results are displayed) Labs Reviewed  COMPREHENSIVE METABOLIC PANEL - Abnormal; Notable for the following components:      Result Value   Sodium 132 (*)    Chloride 97 (*)    Glucose, Bld 111 (*)    Creatinine, Ser 1.10 (*)    Albumin 3.3 (*)    GFR calc non Af Amer 52 (*)    All other components within normal limits  CBC WITH DIFFERENTIAL/PLATELET - Abnormal; Notable for the following components:   Monocytes Absolute 1.2 (*)    All other components within normal limits  I-STAT CG4 LACTIC ACID, ED  I-STAT CG4 LACTIC ACID, ED    EKG None  Radiology Dg Chest 2 View  Result Date: 01/20/2018 CLINICAL DATA:  Cough x2 weeks, fever EXAM: CHEST - 2 VIEW COMPARISON:  05/27/2014 FINDINGS: Lungs are clear.  No pleural effusion or pneumothorax. The heart is normal in size. Degenerative changes of the visualized thoracolumbar spine. IMPRESSION: Normal chest radiographs. Electronically Signed   By: Julian Hy M.D.   On: 01/20/2018 19:41    Procedures Procedures (including critical care time)  Medications Ordered in ED Medications - No data to display   Initial Impression / Assessment and Plan / ED Course  I have reviewed the triage vital signs and the nursing notes.  Pertinent labs & imaging results that were available during my care of the patient were reviewed by me and considered in my medical decision making (see chart for details).     67 yo F with a chief complaint of cough.  This been going on for the past 2 weeks now she is having fevers.  On my exam patient has signs of right otitis media.  Will cover with azithromycin to cover for pertussis as well as otitis media.  Lactate is negative.  Patient's sodium and chloride are  mildly low.  No leukocytosis.  Chest x-ray reviewed by me without focal infiltrate.  Have her follow-up with her PCP.  Discharge home.  11:29 PM:  I have discussed the diagnosis/risks/treatment options with the patient and family and believe  the pt to be eligible for discharge home to follow-up with PCP. We also discussed returning to the ED immediately if new or worsening sx occur. We discussed the sx which are most concerning (e.g., sudden worsening pain, fever, inability to tolerate by mouth) that necessitate immediate return. Medications administered to the patient during their visit and any new prescriptions provided to the patient are listed below.  Medications given during this visit Medications - No data to display    The patient appears reasonably screen and/or stabilized for discharge and I doubt any other medical condition or other [facility] requiring further screening, evaluation, or treatment in the ED at this time prior to discharge.    Final Clinical Impressions(s) / ED Diagnoses   Final diagnoses:  Acute suppurative otitis media of right ear without spontaneous rupture of tympanic membrane, recurrence not specified  Acute nasopharyngitis    ED Discharge Orders        Ordered    azithromycin (ZITHROMAX) 250 MG tablet  Daily     01/20/18 2250    benzonatate (TESSALON) 100 MG capsule  Every 8 hours     01/20/18 [place], [place], DO 01/20/18 2330

## 2018-01-20 NOTE — ED Triage Notes (Signed)
Pt presents with cough x 2 wks; seen by PCP on Friday and dx with bronchitis; now with fever, chills, body aches; here for pneumonia r/o

## 2018-01-20 NOTE — ED Notes (Signed)
Pt verbalizes understanding of d/c instructions. Pt will pick up prescriptions. Pt ambulatory at d/c with all belongings and with family.

## 2018-01-28 ENCOUNTER — Telehealth: Payer: Self-pay | Admitting: Internal Medicine

## 2018-01-28 NOTE — Telephone Encounter (Signed)
Copied from [place] [phone]. Topic: General - Other >> Jan 28, 2018  3:15 PM Yvette Rack wrote: Reason for CRM: pt calling stating that she is still taking the albuterol (PROVENTIL HFA;VENTOLIN HFA) 108 (90 Base) MCG/ACT inhaler azithromycin (ZITHROMAX) 250 MG tablet and has been running a fever of 101.6 she take tylenol still doesn't help  [phone] will be this number until 4 after that call her home number at [phone]

## 2018-01-28 NOTE — Telephone Encounter (Signed)
Last seen by plot on 8/2

## 2018-01-29 NOTE — Telephone Encounter (Signed)
Copied from [place] [phone]. Topic: General - Other >> Jan 29, 2018  8:27 AM Carolyn Stare wrote:  Pt is following up on her call back   [phone]

## 2018-01-29 NOTE — Telephone Encounter (Signed)
I replied via MyChart ?Thx ?

## 2018-01-29 NOTE — Telephone Encounter (Deleted)
Copied from [place] [phone]. Topic: General - Other >> Jan 29, 2018  8:27 AM Carolyn Stare wrote:  Pt is following up on her call back   [phone]

## 2018-01-29 NOTE — Telephone Encounter (Signed)
Please advise 

## 2018-01-30 ENCOUNTER — Other Ambulatory Visit: Payer: Self-pay | Admitting: Internal Medicine

## 2018-01-30 ENCOUNTER — Ambulatory Visit (INDEPENDENT_AMBULATORY_CARE_PROVIDER_SITE_OTHER): Payer: BLUE CROSS/BLUE SHIELD | Admitting: Internal Medicine

## 2018-01-30 ENCOUNTER — Encounter: Payer: Self-pay | Admitting: Internal Medicine

## 2018-01-30 ENCOUNTER — Ambulatory Visit (INDEPENDENT_AMBULATORY_CARE_PROVIDER_SITE_OTHER)
Admission: RE | Admit: 2018-01-30 | Discharge: 2018-01-30 | Disposition: A | Discharge status: Home / Self Care | Payer: BLUE CROSS/BLUE SHIELD | Source: Ambulatory Visit | Attending: Internal Medicine | Admitting: Internal Medicine

## 2018-01-30 VITALS — BP 122/84 | HR 98 | Temp 99.0°F | Ht 60.0 in | Wt 224.0 lb

## 2018-01-30 DIAGNOSIS — R509 Fever, unspecified: Secondary | ICD-10-CM | POA: Diagnosis not present

## 2018-01-30 DIAGNOSIS — R519 Headache, unspecified: Secondary | ICD-10-CM | POA: Insufficient documentation

## 2018-01-30 DIAGNOSIS — R51 Headache: Secondary | ICD-10-CM

## 2018-01-30 MED ORDER — AMOXICILLIN-POT CLAVULANATE 875-125 MG PO TABS: 1.0000 | ORAL_TABLET | Freq: Two times a day (BID) | ORAL | 0 refills | Status: DC

## 2018-01-30 NOTE — Assessment & Plan Note (Signed)
acute w/a HA Sinus CT PO Augmentin Off work

## 2018-01-30 NOTE — Progress Notes (Signed)
Subjective:  Patient ID: Sierra Johnson, female    DOB: 1951/03/10  Age: 67 y.o. MRN: [id number]  CC: No chief complaint on file.   HPI Sierra Johnson presents for HA, chills, fever since Friday. T 101.6 every evening. The cough is better. The HA is frontal. 8/10. Finished Z pack on Friday. Labs, CXR were ok  Outpatient Medications Prior to Visit  Medication Sig Dispense Refill  . albuterol (PROVENTIL HFA;VENTOLIN HFA) 108 (90 Base) MCG/ACT inhaler Inhale 2 puffs into the lungs every 4 (four) hours as needed for wheezing or shortness of breath. Dispense with aerochamber 1 Inhaler 0  . Cholecalciferol (VITAMIN D3) 1000 UNITS CAPS Take 1 capsule by mouth daily.     . fish oil-omega-3 fatty acids 1000 MG capsule Take 1 capsule by mouth 2 (two) times daily.      . fluticasone furoate-vilanterol (BREO ELLIPTA) 100-25 MCG/INH AEPB Inhale 1 puff into the lungs daily. 1 each 5  . FREESTYLE INSULINX TEST test strip 1 EACH BY OTHER ROUTE 2 (TWO) TIMES DAILY. USE TO CHECK BLOOD SUGARS TWICE A DAY 200 each 1  . glucose monitoring kit (FREESTYLE) monitoring kit 1 each by Does not apply route 2 (two) times daily. Dx: E11.9, I10 1 each 0  . ibuprofen (ADVIL,MOTRIN) 600 MG tablet TAKE 1 TABLET (600 MG TOTAL) BY MOUTH 2 (TWO) TIMES DAILY AS NEEDED FOR MODERATE PAIN. 60 tablet 3  . Insulin Glargine (TOUJEO SOLOSTAR) 300 UNIT/ML SOPN Inject 18 Units into the skin every morning. 12 pen 1  . Insulin Pen Needle 31G X 5 MM MISC 1 Units by Does not apply route 4 (four) times daily. 50 each 11  . Lancets (FREESTYLE) lancets 1 each by Other route 2 (two) times daily. Dx: E11.9, I10 100 each 11  . metFORMIN (GLUCOPHAGE) 500 MG tablet TAKE 1 TABLET BY MOUTH 3 TIMES DAILY 270 tablet 3  . promethazine-codeine (PHENERGAN WITH CODEINE) 6.25-10 MG/5ML syrup Take 5 mLs by mouth every 4 (four) hours as needed. 300 mL 0  . Spacer/Aero-Holding Chambers (AEROCHAMBER PLUS) inhaler Use as instructed 1 each 2  . TOUJEO SOLOSTAR  300 UNIT/ML SOPN INJECT 18 UNITS INTO THE SKIN EVERY MORNING. 18 pen 1  . triamterene-hydrochlorothiazide (MAXZIDE-25) 37.5-25 MG tablet TAKE 1 TABLET BY MOUTH DAILY. 30 tablet 11  . vitamin B-12 (CYANOCOBALAMIN) 1000 MCG tablet Take 1,000 mcg by mouth daily.      . benzonatate (TESSALON) 100 MG capsule Take 1 capsule (100 mg total) by mouth every 8 (eight) hours. (Patient not taking: Reported on 01/30/2018) 21 capsule 0  . azithromycin (ZITHROMAX) 250 MG tablet Take 1 tablet (250 mg total) by mouth daily. Take first 2 tablets together, then 1 every day until finished. (Patient not taking: Reported on 01/30/2018) 6 tablet 0   No facility-administered medications prior to visit.     ROS: Review of Systems  Constitutional: Positive for chills and fever. Negative for activity change, appetite change, fatigue and unexpected weight change.  HENT: Negative for congestion, mouth sores and sinus pressure.   Eyes: Negative for visual disturbance.  Respiratory: Positive for cough. Negative for chest tightness.   Gastrointestinal: Negative for abdominal pain and nausea.  Genitourinary: Negative for difficulty urinating, frequency and vaginal pain.  Musculoskeletal: Negative for back pain and gait problem.  Skin: Negative for pallor and rash.  Neurological: Positive for headaches. Negative for dizziness, tremors, weakness and numbness.  Psychiatric/Behavioral: Negative for confusion, sleep disturbance and suicidal ideas.  Objective:  BP 122/84 (BP Location: Left Arm, Patient Position: Sitting, Cuff Size: Large)   Pulse 98   Temp 99 F (37.2 C) (Oral)   Ht 5' (1.524 m)   Wt 224 lb (101.6 kg)   SpO2 95%   BMI 43.75 kg/m   BP Readings from Last 3 Encounters:  01/30/18 122/84  01/20/18 134/80  01/18/18 124/82    Wt Readings from Last 3 Encounters:  01/30/18 224 lb (101.6 kg)  01/20/18 229 lb (103.9 kg)  01/18/18 229 lb (103.9 kg)    Physical Exam  Constitutional: She appears  well-developed. No distress.  HENT:  Head: Normocephalic.  Right Ear: External ear normal.  Left Ear: External ear normal.  Nose: Nose normal.  Mouth/Throat: Oropharynx is clear and moist.  Eyes: Pupils are equal, round, and reactive to light. Conjunctivae are normal. Right eye exhibits no discharge. Left eye exhibits no discharge.  Neck: Normal range of motion. Neck supple. No JVD present. No tracheal deviation present. No thyromegaly present.  Cardiovascular: Normal rate, regular rhythm and normal heart sounds.  Pulmonary/Chest: No stridor. No respiratory distress. She has no wheezes.  Abdominal: Soft. Bowel sounds are normal. She exhibits no distension and no mass. There is no tenderness. There is no rebound and no guarding.  Musculoskeletal: She exhibits no edema or tenderness.  Lymphadenopathy:    She has no cervical adenopathy.  Neurological: She displays normal reflexes. No cranial nerve deficit. She exhibits normal muscle tone. Coordination normal.  Skin: No rash noted. No erythema.  Psychiatric: She has a normal mood and affect. Her behavior is normal. Judgment and thought content normal.  looks tired Neck supple  Lab Results  Component Value Date   WBC 9.1 01/20/2018   HGB 12.4 01/20/2018   HCT 38.6 01/20/2018   PLT 305 01/20/2018   GLUCOSE 111 (H) 01/20/2018   CHOL 220 (H) 04/27/2017   TRIG 60.0 04/27/2017   HDL 79.80 04/27/2017   LDLDIRECT 119.5 08/27/2012   LDLCALC 128 (H) 04/27/2017   ALT 22 01/20/2018   AST 27 01/20/2018   NA 132 (L) 01/20/2018   K 3.6 01/20/2018   CL 97 (L) 01/20/2018   CREATININE 1.10 (H) 01/20/2018   BUN 13 01/20/2018   CO2 23 01/20/2018   TSH 1.41 04/27/2017   HGBA1C 6.6 (H) 04/27/2017   MICROALBUR 37.9 (H) 04/27/2017    Dg Chest 2 View  Result Date: 01/20/2018 CLINICAL DATA:  Cough x2 weeks, fever EXAM: CHEST - 2 VIEW COMPARISON:  05/27/2014 FINDINGS: Lungs are clear.  No pleural effusion or pneumothorax. The heart is normal in size.  Degenerative changes of the visualized thoracolumbar spine. IMPRESSION: Normal chest radiographs. Electronically Signed   By: Julian Hy M.D.   On: 01/20/2018 19:41    Assessment & Plan:   There are no diagnoses linked to this encounter.   No orders of the defined types were placed in this encounter.    Follow-up: No follow-ups on file.  Walker Kehr, MD

## 2018-01-30 NOTE — Assessment & Plan Note (Signed)
Sinus CT PO Augmentin Off work

## 2018-02-04 ENCOUNTER — Other Ambulatory Visit: Payer: Self-pay

## 2018-02-04 ENCOUNTER — Encounter: Payer: Self-pay | Admitting: Internal Medicine

## 2018-02-04 ENCOUNTER — Emergency Department (HOSPITAL_COMMUNITY)
Admission: EM | Admit: 2018-02-04 | Discharge: 2018-02-04 | Disposition: A | Discharge status: Home / Self Care | Payer: BLUE CROSS/BLUE SHIELD | Attending: Emergency Medicine | Admitting: Emergency Medicine

## 2018-02-04 ENCOUNTER — Emergency Department (HOSPITAL_COMMUNITY): Payer: BLUE CROSS/BLUE SHIELD

## 2018-02-04 ENCOUNTER — Ambulatory Visit (INDEPENDENT_AMBULATORY_CARE_PROVIDER_SITE_OTHER): Payer: BLUE CROSS/BLUE SHIELD | Admitting: Internal Medicine

## 2018-02-04 VITALS — BP 118/74 | HR 85 | Temp 99.0°F | Ht 60.0 in | Wt 222.0 lb

## 2018-02-04 DIAGNOSIS — I10 Essential (primary) hypertension: Secondary | ICD-10-CM | POA: Diagnosis not present

## 2018-02-04 DIAGNOSIS — B349 Viral infection, unspecified: Secondary | ICD-10-CM | POA: Diagnosis not present

## 2018-02-04 DIAGNOSIS — R509 Fever, unspecified: Secondary | ICD-10-CM

## 2018-02-04 DIAGNOSIS — E119 Type 2 diabetes mellitus without complications: Secondary | ICD-10-CM | POA: Insufficient documentation

## 2018-02-04 DIAGNOSIS — Z794 Long term (current) use of insulin: Secondary | ICD-10-CM | POA: Diagnosis not present

## 2018-02-04 DIAGNOSIS — Z79899 Other long term (current) drug therapy: Secondary | ICD-10-CM | POA: Diagnosis not present

## 2018-02-04 DIAGNOSIS — R51 Headache: Secondary | ICD-10-CM

## 2018-02-04 DIAGNOSIS — J988 Other specified respiratory disorders: Secondary | ICD-10-CM | POA: Diagnosis not present

## 2018-02-04 DIAGNOSIS — B9789 Other viral agents as the cause of diseases classified elsewhere: Secondary | ICD-10-CM

## 2018-02-04 DIAGNOSIS — R519 Headache, unspecified: Secondary | ICD-10-CM

## 2018-02-04 LAB — URINALYSIS, ROUTINE W REFLEX MICROSCOPIC
BACTERIA UA: NONE SEEN
BILIRUBIN URINE: NEGATIVE
Glucose, UA: NEGATIVE mg/dL
Hgb urine dipstick: NEGATIVE
KETONES UR: 5 mg/dL — AB
Leukocytes, UA: NEGATIVE
Nitrite: NEGATIVE
PROTEIN: 100 mg/dL — AB
Specific Gravity, Urine: 1.031 — ABNORMAL HIGH (ref 1.005–1.030)
pH: 5 (ref 5.0–8.0)

## 2018-02-04 LAB — CBC WITH DIFFERENTIAL/PLATELET
Abs Immature Granulocytes: 0 10*3/uL (ref 0.0–0.1)
BASOS ABS: 0 10*3/uL (ref 0.0–0.1)
Basophils Relative: 0 %
Eosinophils Absolute: 0 10*3/uL (ref 0.0–0.7)
Eosinophils Relative: 0 %
HCT: 40 % (ref 36.0–46.0)
HEMOGLOBIN: 13 g/dL (ref 12.0–15.0)
Immature Granulocytes: 0 %
LYMPHS PCT: 15 %
Lymphs Abs: 1.6 10*3/uL (ref 0.7–4.0)
MCH: 27.5 pg (ref 26.0–34.0)
MCHC: 32.5 g/dL (ref 30.0–36.0)
MCV: 84.6 fL (ref 78.0–100.0)
MONO ABS: 1 10*3/uL (ref 0.1–1.0)
MONOS PCT: 10 %
NEUTROS ABS: 7.6 10*3/uL (ref 1.7–7.7)
Neutrophils Relative %: 75 %
Platelets: 296 10*3/uL (ref 150–400)
RBC: 4.73 MIL/uL (ref 3.87–5.11)
RDW: 13.4 % (ref 11.5–15.5)
WBC: 10.3 10*3/uL (ref 4.0–10.5)

## 2018-02-04 LAB — COMPREHENSIVE METABOLIC PANEL
ALK PHOS: 65 U/L (ref 38–126)
ALT: 25 U/L (ref 0–44)
AST: 37 U/L (ref 15–41)
Albumin: 3.1 g/dL — ABNORMAL LOW (ref 3.5–5.0)
Anion gap: 12 (ref 5–15)
BUN: 11 mg/dL (ref 8–23)
CALCIUM: 9 mg/dL (ref 8.9–10.3)
CO2: 21 mmol/L — AB (ref 22–32)
CREATININE: 1.35 mg/dL — AB (ref 0.44–1.00)
Chloride: 99 mmol/L (ref 98–111)
GFR calc non Af Amer: 41 mL/min — ABNORMAL LOW (ref >60)
GFR, EST AFRICAN AMERICAN: 47 mL/min — AB (ref >60)
GLUCOSE: 137 mg/dL — AB (ref 70–99)
Potassium: 4.5 mmol/L (ref 3.5–5.1)
SODIUM: 132 mmol/L — AB (ref 135–145)
Total Bilirubin: 0.7 mg/dL (ref 0.3–1.2)
Total Protein: 8.3 g/dL — ABNORMAL HIGH (ref 6.5–8.1)

## 2018-02-04 LAB — LIPASE, BLOOD: Lipase: 27 U/L (ref 11–51)

## 2018-02-04 MED ORDER — SODIUM CHLORIDE 0.9 % IV BOLUS
500.0000 mL | Freq: Once | INTRAVENOUS | Status: AC
  Administered 2018-02-04: 500 mL via INTRAVENOUS

## 2018-02-04 MED ORDER — KETOROLAC TROMETHAMINE 60 MG/2ML IM SOLN
60.0000 mg | Freq: Once | INTRAMUSCULAR | Status: AC
  Administered 2018-02-04: 60 mg via INTRAMUSCULAR

## 2018-02-04 MED ORDER — LACTATED RINGERS IV BOLUS
1000.0000 mL | Freq: Once | INTRAVENOUS | Status: AC
  Administered 2018-02-04: 1000 mL via INTRAVENOUS

## 2018-02-04 NOTE — Progress Notes (Signed)
Subjective:  Patient ID: Sierra Johnson, female    DOB: 12/29/1950  Age: 67 y.o. MRN: [id number]  CC: No chief complaint on file.   HPI Sierra Johnson presents for a low grade fever and HA 8/10 HA. Took Z pac. Now on Augmentin. In bed since Wed -Thur last week. Not better...  Outpatient Medications Prior to Visit  Medication Sig Dispense Refill  . albuterol (PROVENTIL HFA;VENTOLIN HFA) 108 (90 Base) MCG/ACT inhaler Inhale 2 puffs into the lungs every 4 (four) hours as needed for wheezing or shortness of breath. Dispense with aerochamber 1 Inhaler 0  . amoxicillin-clavulanate (AUGMENTIN) 875-125 MG tablet Take 1 tablet by mouth 2 (two) times daily. 20 tablet 0  . benzonatate (TESSALON) 100 MG capsule Take 1 capsule (100 mg total) by mouth every 8 (eight) hours. 21 capsule 0  . Cholecalciferol (VITAMIN D3) 1000 UNITS CAPS Take 1 capsule by mouth daily.     . fish oil-omega-3 fatty acids 1000 MG capsule Take 1 capsule by mouth 2 (two) times daily.      . fluticasone furoate-vilanterol (BREO ELLIPTA) 100-25 MCG/INH AEPB Inhale 1 puff into the lungs daily. 1 each 5  . FREESTYLE INSULINX TEST test strip 1 EACH BY OTHER ROUTE 2 (TWO) TIMES DAILY. USE TO CHECK BLOOD SUGARS TWICE A DAY 200 each 1  . glucose monitoring kit (FREESTYLE) monitoring kit 1 each by Does not apply route 2 (two) times daily. Dx: E11.9, I10 1 each 0  . ibuprofen (ADVIL,MOTRIN) 600 MG tablet TAKE 1 TABLET (600 MG TOTAL) BY MOUTH 2 (TWO) TIMES DAILY AS NEEDED FOR MODERATE PAIN. 60 tablet 3  . Insulin Glargine (TOUJEO SOLOSTAR) 300 UNIT/ML SOPN Inject 18 Units into the skin every morning. 12 pen 1  . Insulin Pen Needle 31G X 5 MM MISC 1 Units by Does not apply route 4 (four) times daily. 50 each 11  . Lancets (FREESTYLE) lancets 1 each by Other route 2 (two) times daily. Dx: E11.9, I10 100 each 11  . metFORMIN (GLUCOPHAGE) 500 MG tablet TAKE 1 TABLET BY MOUTH 3 TIMES DAILY 270 tablet 3  . promethazine-codeine (PHENERGAN WITH  CODEINE) 6.25-10 MG/5ML syrup Take 5 mLs by mouth every 4 (four) hours as needed. 300 mL 0  . Spacer/Aero-Holding Chambers (AEROCHAMBER PLUS) inhaler Use as instructed 1 each 2  . TOUJEO SOLOSTAR 300 UNIT/ML SOPN INJECT 18 UNITS INTO THE SKIN EVERY MORNING. 18 pen 1  . triamterene-hydrochlorothiazide (MAXZIDE-25) 37.5-25 MG tablet TAKE 1 TABLET BY MOUTH DAILY. 30 tablet 11  . vitamin B-12 (CYANOCOBALAMIN) 1000 MCG tablet Take 1,000 mcg by mouth daily.       No facility-administered medications prior to visit.     ROS: Review of Systems  Constitutional: Positive for chills, fatigue and fever. Negative for activity change, appetite change and unexpected weight change.  HENT: Negative for congestion, mouth sores and sinus pressure.   Eyes: Negative for visual disturbance.  Respiratory: Negative for cough and chest tightness.   Gastrointestinal: Negative for abdominal pain and nausea.  Genitourinary: Negative for difficulty urinating, frequency and vaginal pain.  Musculoskeletal: Negative for back pain and gait problem.  Skin: Negative for pallor and rash.  Neurological: Positive for headaches. Negative for dizziness, tremors, weakness and numbness.  Psychiatric/Behavioral: Negative for behavioral problems, confusion, decreased concentration, hallucinations and sleep disturbance. The patient is not nervous/anxious.     Objective:  BP 118/74 (BP Location: Left Arm, Patient Position: Sitting, Cuff Size: Large)   Pulse 85  Temp 99 F (37.2 C) (Oral)   Ht 5' (1.524 m)   Wt 222 lb (100.7 kg)   SpO2 95%   BMI 43.36 kg/m   BP Readings from Last 3 Encounters:  02/04/18 118/74  01/30/18 122/84  01/20/18 134/80    Wt Readings from Last 3 Encounters:  02/04/18 222 lb (100.7 kg)  01/30/18 224 lb (101.6 kg)  01/20/18 229 lb (103.9 kg)    Physical Exam  Constitutional: She appears well-developed. No distress.  HENT:  Head: Normocephalic.  Right Ear: External ear normal.  Left Ear:  External ear normal.  Nose: Nose normal.  Mouth/Throat: Oropharynx is clear and moist.  Eyes: Pupils are equal, round, and reactive to light. Conjunctivae are normal. Right eye exhibits no discharge. Left eye exhibits no discharge.  Neck: Normal range of motion. Neck supple. No JVD present. No tracheal deviation present. No thyromegaly present.  Cardiovascular: Normal rate, regular rhythm and normal heart sounds.  Pulmonary/Chest: No stridor. No respiratory distress. She has no wheezes.  Abdominal: Soft. Bowel sounds are normal. She exhibits no distension and no mass. There is no tenderness. There is no rebound and no guarding.  Musculoskeletal: She exhibits no edema or tenderness.  Lymphadenopathy:    She has no cervical adenopathy.  Neurological: She displays normal reflexes. No cranial nerve deficit or sensory deficit. She exhibits normal muscle tone. Coordination normal.  Skin: No rash noted. No erythema.  Psychiatric: She has a normal mood and affect. Her behavior is normal. Judgment and thought content normal.  looks tired; laying down on the exam table Meningeal signs are (-). Neck is a little stiff  Lab Results  Component Value Date   WBC 9.1 01/20/2018   HGB 12.4 01/20/2018   HCT 38.6 01/20/2018   PLT 305 01/20/2018   GLUCOSE 111 (H) 01/20/2018   CHOL 220 (H) 04/27/2017   TRIG 60.0 04/27/2017   HDL 79.80 04/27/2017   LDLDIRECT 119.5 08/27/2012   LDLCALC 128 (H) 04/27/2017   ALT 22 01/20/2018   AST 27 01/20/2018   NA 132 (L) 01/20/2018   K 3.6 01/20/2018   CL 97 (L) 01/20/2018   CREATININE 1.10 (H) 01/20/2018   BUN 13 01/20/2018   CO2 23 01/20/2018   TSH 1.41 04/27/2017   HGBA1C 6.6 (H) 04/27/2017   MICROALBUR 37.9 (H) 04/27/2017    Ct Maxillofacial Ltd Wo Cm  Result Date: 01/30/2018 CLINICAL DATA:  Frontal headache and sinus drainage EXAM: CT PARANASAL SINUS LIMITED WITHOUT CONTRAST TECHNIQUE: Non-contiguous multidetector CT images of the paranasal sinuses were  obtained in a single plane without contrast. COMPARISON:  None. FINDINGS: There is a small amount of fluid in the right sphenoid sinus. Trace bilateral maxillary sinus fluid. IMPRESSION: Fluid in the sphenoid and maxillary sinuses may indicate acute sinusitis. Electronically Signed   By: Ulyses Jarred M.D.   On: 01/30/2018 20:26    Assessment & Plan:   There are no diagnoses linked to this encounter.   No orders of the defined types were placed in this encounter.    Follow-up: No follow-ups on file.  Walker Kehr, MD

## 2018-02-04 NOTE — ED Provider Notes (Signed)
[facility] EMERGENCY DEPARTMENT Provider Note   CSN: [id number] Arrival date & time: 02/04/18  1214     History   Chief Complaint Chief Complaint  Patient presents with  . Fever    HPI Sierra Johnson is a 67 y.o. female.  The history is provided by the patient.  Fever   This is a new problem. The current episode started more than 1 week ago. Episode frequency: intermittently. The problem has been gradually improving. The maximum temperature noted was 99 to 99.9 F. Associated symptoms include headaches. Pertinent negatives include no chest pain, no fussiness, no sleepiness, no diarrhea, no vomiting, no congestion, no sore throat, no tugging at ear, no muscle aches and no cough. She has tried acetaminophen and ibuprofen (tried zpak, on augmentin now) for the symptoms. The treatment provided mild relief.    Past Medical History:  Diagnosis Date  . ANEMIA-NOS 04/24/2007  . DIABETES MELLITUS, TYPE II 04/24/2007  . Elevated glucose 2011  . HYPERLIPIDEMIA 07/17/2007   mild  . HYPERTENSION 04/24/2007  . Menopause 1998   Dr. Gaetano Net  . OBSTRUCTIVE SLEEP APNEA 09/30/2009   on CPAP 2011  . VITAMIN B12 DEFICIENCY 04/24/2007    Patient Active Problem List   Diagnosis Date Noted  . Fever and chills 01/30/2018  . Headache 01/30/2018  . Asthmatic bronchitis 01/18/2018  . Upper respiratory infection 04/27/2017  . Insomnia 03/08/2016  . Eczema 04/13/2015  . Edema 02/11/2015  . S/P laparoscopic hernia repair 06/03/2014  . Knee osteoarthritis 04/30/2014  . Recurrent ventral incisional hernia 10/16/2012  . S/P repair of ventral hernia 08/28/2012  . Diabetes mellitus type 2 in obese ([place]) 05/07/2012  . Morbid obesity ([place]) 01/18/2012  . Well adult exam 04/17/2011  . Sinusitis, acute 04/17/2011  . SHOULDER PAIN 12/07/2009  . POLYURIA 12/07/2009  . Obstructive sleep apnea 09/30/2009  . FATIGUE 09/15/2009  . PARESTHESIA 06/08/2008  . Dyslipidemia 07/17/2007  . B12  deficiency 04/24/2007  . Anemia 04/24/2007  . Essential hypertension 04/24/2007    Past Surgical History:  Procedure Laterality Date  . ABDOMINAL HYSTERECTOMY    . HERNIA REPAIR  11/11   Incar. midline hernia  . INCISIONAL HERNIA REPAIR N/A 06/03/2014   Procedure: LAPAROSCOPIC REPAIR RECURRENT  INCISIONAL HERNIA;  Surgeon: Georganna Skeans, MD;  Location: [facility];  Service: General;  Laterality: N/A;  . INSERTION OF MESH N/A 06/03/2014   Procedure: INSERTION OF MESH;  Surgeon: Georganna Skeans, MD;  Location: [facility];  Service: General;  Laterality: N/A;     OB History   None      Home Medications    Prior to Admission medications   Medication Sig Start Date End Date Taking? Authorizing Provider  amoxicillin-clavulanate (AUGMENTIN) 875-125 MG tablet Take 1 tablet by mouth 2 (two) times daily. 01/30/18  Yes Plotnikov, [name], MD  Cholecalciferol (VITAMIN D3) 1000 UNITS CAPS Take 1,000 Units by mouth daily.    Yes [provider]  fish oil-omega-3 fatty acids 1000 MG capsule Take 1 g by mouth 2 (two) times daily.    Yes [provider]  ibuprofen (ADVIL,MOTRIN) 600 MG tablet TAKE 1 TABLET (600 MG TOTAL) BY MOUTH 2 (TWO) TIMES DAILY AS NEEDED FOR MODERATE PAIN. 04/23/17  Yes Plotnikov, [name], MD  Insulin Glargine (TOUJEO SOLOSTAR) 300 UNIT/ML SOPN Inject 18 Units into the skin every morning. 03/20/16  Yes Plotnikov, [name], MD  metFORMIN (GLUCOPHAGE) 500 MG tablet TAKE 1 TABLET BY MOUTH 3 TIMES DAILY  Patient taking differently: Take 500 mg by mouth 3 (three) times daily.  05/06/17  Yes Plotnikov, [name], MD  vitamin B-12 (CYANOCOBALAMIN) 1000 MCG tablet Take 1,000 mcg by mouth daily.     Yes [provider]  albuterol (PROVENTIL HFA;VENTOLIN HFA) 108 (90 Base) MCG/ACT inhaler Inhale 2 puffs into the lungs every 4 (four) hours as needed for wheezing or shortness of breath. Dispense with aerochamber Patient not taking: Reported on 02/04/2018 12/10/15    Melynda Ripple, MD  benzonatate (TESSALON) 100 MG capsule Take 1 capsule (100 mg total) by mouth every 8 (eight) hours. Patient not taking: Reported on 02/04/2018 01/20/18   Deno Etienne, DO  fluticasone furoate-vilanterol (BREO ELLIPTA) 100-25 MCG/INH AEPB Inhale 1 puff into the lungs daily. Patient not taking: Reported on 02/04/2018 01/18/18   Plotnikov, [name], MD  FREESTYLE INSULINX TEST test strip 1 EACH BY OTHER ROUTE 2 (TWO) TIMES DAILY. USE TO CHECK BLOOD SUGARS TWICE A DAY 01/30/18   Plotnikov, [name], MD  glucose monitoring kit (FREESTYLE) monitoring kit 1 each by Does not apply route 2 (two) times daily. Dx: E11.9, I10 10/15/15   Plotnikov, [name], MD  Insulin Pen Needle 31G X 5 MM MISC 1 Units by Does not apply route 4 (four) times daily. 12/22/16   Plotnikov, [name], MD  Lancets (FREESTYLE) lancets 1 each by Other route 2 (two) times daily. Dx: E11.9, I10 04/13/16   Plotnikov, [name], MD  promethazine-codeine (PHENERGAN WITH CODEINE) 6.25-10 MG/5ML syrup Take 5 mLs by mouth every 4 (four) hours as needed. Patient not taking: Reported on 02/04/2018 01/18/18   Plotnikov, [name], MD  Spacer/Aero-Holding Chambers (AEROCHAMBER PLUS) inhaler Use as instructed 12/10/15   Melynda Ripple, MD  TOUJEO SOLOSTAR 300 UNIT/ML SOPN INJECT 18 UNITS INTO THE SKIN EVERY MORNING. Patient not taking: Reported on 02/04/2018 06/04/17   Plotnikov, [name], MD  triamterene-hydrochlorothiazide (MAXZIDE-25) 37.5-25 MG tablet TAKE 1 TABLET BY MOUTH DAILY. Patient not taking: Reported on 02/04/2018 03/07/17   Plotnikov, [name], MD    Family History Family History  Problem Relation Age of Onset  . Heart disease Mother 51       ?CAD  . Cancer Father        lung  . Heart disease Brother 67       CAD  . Hypertension Other     Social History Social History   Tobacco Use  . Smoking status: Never Smoker  . Smokeless tobacco: Never Used  Substance Use Topics  . Alcohol use: No  . Drug use: No      Allergies   Amlodipine; Benazepril; and Tetracycline   Review of Systems Review of Systems  Constitutional: Positive for fever. Negative for chills.  HENT: Negative for congestion, ear pain and sore throat.   Eyes: Negative for pain and visual disturbance.  Respiratory: Negative for cough and shortness of breath.   Cardiovascular: Negative for chest pain and palpitations.  Gastrointestinal: Negative for abdominal pain, diarrhea and vomiting.  Genitourinary: Negative for dysuria and hematuria.  Musculoskeletal: Positive for myalgias. Negative for arthralgias and back pain.  Skin: Negative for color change and rash.  Neurological: Positive for headaches. Negative for dizziness, tremors, seizures, syncope, facial asymmetry, speech difficulty, weakness, light-headedness and numbness.  All other systems reviewed and are negative.    Physical Exam Updated Vital Signs  ED Triage Vitals  Enc Vitals Group     BP 02/04/18 1336 117/71     Pulse Rate  02/04/18 1336 79     Resp --      Temp 02/04/18 1349 98.8 F (37.1 C)     Temp Source 02/04/18 1349 Oral     SpO2 02/04/18 1336 97 %     Weight 02/04/18 1349 220 lb (99.8 kg)     Height 02/04/18 1349 5' (1.524 m)     Head Circumference --      Peak Flow --      Pain Score 02/04/18 1320 0     Pain Loc --      Pain Edu? --      Excl. in [place]? --     Physical Exam  Constitutional: She is oriented to person, place, and time. She appears well-developed and well-nourished. No distress.  HENT:  Head: Normocephalic and atraumatic.  Eyes: Pupils are equal, round, and reactive to light. Conjunctivae and EOM are normal.  Neck: Normal range of motion. Neck supple.  Cardiovascular: Normal rate and regular rhythm.  No murmur heard. Pulmonary/Chest: Effort normal and breath sounds normal. No respiratory distress.  Abdominal: Soft. Bowel sounds are normal. There is no tenderness.  Musculoskeletal: Normal range of motion. She exhibits no  edema.  Neurological: She is alert and oriented to person, place, and time. No cranial nerve deficit or sensory deficit. She exhibits normal muscle tone. Coordination normal.  5+/5 strength, normal sensation, no drift, normal finger to nose finger, normal gait  Skin: Skin is warm and dry.  Psychiatric: She has a normal mood and affect.  Nursing note and vitals reviewed.    ED Treatments / Results  Labs (all labs ordered are listed, but only abnormal results are displayed) Labs Reviewed  COMPREHENSIVE METABOLIC PANEL - Abnormal; Notable for the following components:      Result Value   Sodium 132 (*)    CO2 21 (*)    Glucose, Bld 137 (*)    Creatinine, Ser 1.35 (*)    Total Protein 8.3 (*)    Albumin 3.1 (*)    GFR calc non Af Amer 41 (*)    GFR calc Af Amer 47 (*)    All other components within normal limits  URINALYSIS, ROUTINE W REFLEX MICROSCOPIC - Abnormal; Notable for the following components:   Color, Urine AMBER (*)    APPearance HAZY (*)    Specific Gravity, Urine 1.031 (*)    Ketones, ur 5 (*)    Protein, ur 100 (*)    All other components within normal limits  CBC WITH DIFFERENTIAL/PLATELET  LIPASE, BLOOD    EKG None  Radiology Dg Chest 2 View  Result Date: 02/04/2018 CLINICAL DATA:  Fever EXAM: CHEST - 2 VIEW COMPARISON:  01/20/2018 chest radiograph. FINDINGS: Stable cardiomediastinal silhouette with normal heart size. No pneumothorax. No pleural effusion. Lungs appear clear, with no acute consolidative airspace disease and no pulmonary edema. IMPRESSION: No active cardiopulmonary disease. Electronically Signed   By: Ilona Sorrel M.D.   On: 02/04/2018 13:17    Procedures Procedures (including critical care time)  Medications Ordered in ED Medications  lactated ringers bolus 1,000 mL (0 mLs Intravenous Stopped 02/04/18 1407)  sodium chloride 0.9 % bolus 500 mL (0 mLs Intravenous Stopped 02/04/18 1636)     Initial Impression / Assessment and Plan / ED  Course  I have reviewed the triage vital signs and the nursing notes.  Pertinent labs & imaging results that were available during my care of the patient were reviewed by me and considered in my  medical decision making (see chart for details).     Sierra Johnson is a 67 year old female history of type 2 diabetes who presents to the ED with fever, viral-like illness.  Patient with normal vitals.  No fever.  Patient with headaches, sinus congestion over the last several weeks.  Intermittent fevers.  Patient was on Z-Pak with no improvement.  Patient currently on Augmentin for sinus infection.  Patient sent from primary care doctor for further evaluation for fever.  However, patient without fever upon arrival.  Denies any headache, neck pain, neck stiffness at this time.  Patient with normal neurological exam.  No concern for meningitis.  Patient with no signs of pneumonia on chest x-ray.  Urinalysis shows no signs of infection.  Patient with no abdominal pain.  No back pain.  No rash, joint swelling.  Patient otherwise with no significant leukocytosis, anemia, likely abnormalities, kidney injury.  Patient given IV fluids for hydration.  Overall well-appearing.  Suspect patient with ongoing viral process.  Recommend continued antibiotics and follow-up with primary care doctor.  Patient discharged in good condition.  Final Clinical Impressions(s) / ED Diagnoses   Final diagnoses:  Viral illness  Viral respiratory illness  Fever, unspecified fever cause    ED Discharge Orders    None       Lennice Sites, DO 02/04/18 1908

## 2018-02-04 NOTE — ED Notes (Signed)
ED Provider at bedside. 

## 2018-02-04 NOTE — ED Triage Notes (Signed)
Pt presents to ED with fever, body aches, and chills. Went to doctor today and was sent here for a spinal tap. Pt A&Ox4. Family at bedside. No chest pain, SOB. Symptoms started about 4 weeks ago.

## 2018-02-04 NOTE — Assessment & Plan Note (Addendum)
Severe HA w/a low grade fever and HA 8/10 HA. Took Z pac. Now on Augmentin. In bed since Wed -Thur last week. Not better... I asked the pt to go to [facility] ER to r/o meningitis vs other. Mr Tercero will drive. I spoke w/son Larkin Ina who is an IR resident in [place].  Toradol IM given for HA

## 2018-02-04 NOTE — ED Notes (Signed)
Patient verbalizes understanding of discharge instructions. Opportunity for questioning and answers were provided. Armband removed by staff, pt discharged from ED ambulatory.   

## 2018-02-04 NOTE — Assessment & Plan Note (Addendum)
Severe HA w/a low grade fever and HA 8/10 HA. Took Z pac. Now on Augmentin. In bed since Wed -Thur last week. Not better... I asked the pt to go to [facility] ER to r/o meningitis vs other. Sierra Johnson will drive. I spoke w/son Sierra Johnson who is an IR resident in [place].

## 2018-02-06 NOTE — Telephone Encounter (Signed)
Sierra Johnson, Pls extend the off work note for another week. Thx       Good Morning Doctor,       I am still running a fever,  headache, chills and diarrhea.  Basically still in bed, however I am drinking more.   I am unable to return to work.   Will you please complete the paperwork sent from [place]     Thank you , so much, I appreciate all you are doing to get me well. Thanks, Sierra Johnson. Royce Macadamia

## 2018-02-07 ENCOUNTER — Telehealth: Payer: Self-pay | Admitting: Emergency Medicine

## 2018-02-07 ENCOUNTER — Other Ambulatory Visit: Payer: Self-pay | Admitting: Internal Medicine

## 2018-02-07 NOTE — Telephone Encounter (Signed)
Is she better?  Do I need to see her tomorrow? Please please asked the patient to go to our lab on 8/23 to do the blood work that I ordered before. Thanks, AP

## 2018-02-07 NOTE — Telephone Encounter (Signed)
Copied from [place] [id number]. Topic: Referral - Request >> Feb 07, 2018  3:06 PM Scherrie Gerlach wrote: Reason for CRM: pt would like referral to an infectious disease dr.  Olam Idler states the dr knows why. Husband states ok to put on mychart

## 2018-02-07 NOTE — Telephone Encounter (Signed)
FMLA given to Dr. Alain Marion

## 2018-02-08 MED ORDER — TRIAMTERENE-HCTZ 37.5-25 MG PO TABS: 1.0000 | ORAL_TABLET | Freq: Every day | ORAL | 3 refills | Status: DC

## 2018-02-08 NOTE — Telephone Encounter (Signed)
Pt states that she has not had a fever today and does not feel that she needs to come in. Would just like referral placed. Pt will come to lab to get blood drawn.

## 2018-02-08 NOTE — Addendum Note (Signed)
Addended by: Karren Cobble on: 02/08/2018 08:52 AM   Modules accepted: Orders

## 2018-02-11 ENCOUNTER — Other Ambulatory Visit: Payer: Self-pay | Admitting: Internal Medicine

## 2018-02-11 ENCOUNTER — Telehealth: Payer: Self-pay | Admitting: Internal Medicine

## 2018-02-11 ENCOUNTER — Other Ambulatory Visit (INDEPENDENT_AMBULATORY_CARE_PROVIDER_SITE_OTHER): Payer: BLUE CROSS/BLUE SHIELD

## 2018-02-11 DIAGNOSIS — R509 Fever, unspecified: Secondary | ICD-10-CM

## 2018-02-11 DIAGNOSIS — Z0279 Encounter for issue of other medical certificate: Secondary | ICD-10-CM

## 2018-02-11 LAB — BASIC METABOLIC PANEL
BUN: 13 mg/dL (ref 6–23)
CO2: 23 meq/L (ref 19–32)
Calcium: 9.7 mg/dL (ref 8.4–10.5)
Chloride: 97 mEq/L (ref 96–112)
Creatinine, Ser: 1.28 mg/dL — ABNORMAL HIGH (ref 0.40–1.20)
GFR: 53.87 mL/min — AB (ref >60.00)
GLUCOSE: 178 mg/dL — AB (ref 70–99)
POTASSIUM: 4 meq/L (ref 3.5–5.1)
SODIUM: 130 meq/L — AB (ref 135–145)

## 2018-02-11 LAB — CBC WITH DIFFERENTIAL/PLATELET
BASOS PCT: 0.3 % (ref 0.0–3.0)
Basophils Absolute: 0 10*3/uL (ref 0.0–0.1)
EOS ABS: 0.1 10*3/uL (ref 0.0–0.7)
EOS PCT: 0.8 % (ref 0.0–5.0)
HEMATOCRIT: 35.6 % — AB (ref 36.0–46.0)
HEMOGLOBIN: 12.1 g/dL (ref 12.0–15.0)
LYMPHS PCT: 5.3 % — AB (ref 12.0–46.0)
Lymphs Abs: 0.7 10*3/uL (ref 0.7–4.0)
MCHC: 34.1 g/dL (ref 30.0–36.0)
MCV: 82.3 fl (ref 78.0–100.0)
Monocytes Absolute: 1.2 10*3/uL — ABNORMAL HIGH (ref 0.1–1.0)
Monocytes Relative: 8.9 % (ref 3.0–12.0)
Neutro Abs: 11.1 10*3/uL — ABNORMAL HIGH (ref 1.4–7.7)
Neutrophils Relative %: 84.7 % — ABNORMAL HIGH (ref 43.0–77.0)
Platelets: 419 10*3/uL — ABNORMAL HIGH (ref 150.0–400.0)
RBC: 4.32 Mil/uL (ref 3.87–5.11)
RDW: 14.5 % (ref 11.5–15.5)
WBC: 13.1 10*3/uL — AB (ref 4.0–10.5)

## 2018-02-11 LAB — URINALYSIS, ROUTINE W REFLEX MICROSCOPIC
Bilirubin Urine: NEGATIVE
HGB URINE DIPSTICK: NEGATIVE
Ketones, ur: NEGATIVE
LEUKOCYTES UA: NEGATIVE
NITRITE: NEGATIVE
RBC / HPF: NONE SEEN (ref 0–?)
Specific Gravity, Urine: 1.015 (ref 1.000–1.030)
TOTAL PROTEIN, URINE-UPE24: 30 — AB
Urine Glucose: NEGATIVE
Urobilinogen, UA: 1 (ref 0.0–1.0)
pH: 7.5 (ref 5.0–8.0)

## 2018-02-11 LAB — HEPATIC FUNCTION PANEL
ALT: 33 U/L (ref 0–35)
AST: 24 U/L (ref 0–37)
Albumin: 3.8 g/dL (ref 3.5–5.2)
Alkaline Phosphatase: 65 U/L (ref 39–117)
BILIRUBIN DIRECT: 0.1 mg/dL (ref 0.0–0.3)
BILIRUBIN TOTAL: 0.4 mg/dL (ref 0.2–1.2)
Total Protein: 8.7 g/dL — ABNORMAL HIGH (ref 6.0–8.3)

## 2018-02-11 LAB — SEDIMENTATION RATE

## 2018-02-11 NOTE — Telephone Encounter (Signed)
Please advise 

## 2018-02-11 NOTE — Telephone Encounter (Signed)
Patient came into the office stating that she had a fever and requesting additional cough syrup.  [facility]  Patient is also requesting an adjustment on her FMLA.  Stating that she was suppose to return to work today.

## 2018-02-11 NOTE — Telephone Encounter (Signed)
Forms have been updated and faxed to [facility] @ [phone], Copy sent to scan & charged for.   Patient has been informed.

## 2018-02-11 NOTE — Telephone Encounter (Signed)
Off work 1 more week if sick Is she better? Pls come to do labs Thx

## 2018-02-11 NOTE — Telephone Encounter (Signed)
&   Please advise when patient would need to return to work. Thank you.

## 2018-02-13 ENCOUNTER — Other Ambulatory Visit: Payer: Self-pay

## 2018-02-13 DIAGNOSIS — R509 Fever, unspecified: Secondary | ICD-10-CM

## 2018-02-14 ENCOUNTER — Other Ambulatory Visit: Payer: Self-pay | Admitting: Internal Medicine

## 2018-02-15 ENCOUNTER — Ambulatory Visit (INDEPENDENT_AMBULATORY_CARE_PROVIDER_SITE_OTHER): Payer: BLUE CROSS/BLUE SHIELD | Admitting: Family

## 2018-02-15 ENCOUNTER — Ambulatory Visit (INDEPENDENT_AMBULATORY_CARE_PROVIDER_SITE_OTHER)
Admission: RE | Admit: 2018-02-15 | Discharge: 2018-02-15 | Disposition: A | Discharge status: Home / Self Care | Payer: BLUE CROSS/BLUE SHIELD | Source: Ambulatory Visit | Attending: Family | Admitting: Family

## 2018-02-15 ENCOUNTER — Encounter: Payer: Self-pay | Admitting: Family

## 2018-02-15 ENCOUNTER — Other Ambulatory Visit (INDEPENDENT_AMBULATORY_CARE_PROVIDER_SITE_OTHER): Payer: BLUE CROSS/BLUE SHIELD

## 2018-02-15 ENCOUNTER — Telehealth: Payer: Self-pay | Admitting: Internal Medicine

## 2018-02-15 VITALS — BP 144/88 | HR 123 | Temp 99.5°F | Ht 60.0 in | Wt 223.0 lb

## 2018-02-15 DIAGNOSIS — R05 Cough: Secondary | ICD-10-CM

## 2018-02-15 DIAGNOSIS — R053 Chronic cough: Secondary | ICD-10-CM

## 2018-02-15 DIAGNOSIS — R509 Fever, unspecified: Secondary | ICD-10-CM

## 2018-02-15 LAB — CBC WITH DIFFERENTIAL/PLATELET
Basophils Absolute: 0.1 10*3/uL (ref 0.0–0.1)
Basophils Relative: 0.7 % (ref 0.0–3.0)
EOS PCT: 3.2 % (ref 0.0–5.0)
Eosinophils Absolute: 0.4 10*3/uL (ref 0.0–0.7)
HCT: 36.7 % (ref 36.0–46.0)
HEMOGLOBIN: 12.1 g/dL (ref 12.0–15.0)
Lymphocytes Relative: 9.3 % — ABNORMAL LOW (ref 12.0–46.0)
Lymphs Abs: 1.3 10*3/uL (ref 0.7–4.0)
MCHC: 33.1 g/dL (ref 30.0–36.0)
MCV: 81.9 fl (ref 78.0–100.0)
MONO ABS: 1.3 10*3/uL — AB (ref 0.1–1.0)
MONOS PCT: 9.6 % (ref 3.0–12.0)
NEUTROS PCT: 77.2 % — AB (ref 43.0–77.0)
Neutro Abs: 10.5 10*3/uL — ABNORMAL HIGH (ref 1.4–7.7)
Platelets: 382 10*3/uL (ref 150.0–400.0)
RBC: 4.48 Mil/uL (ref 3.87–5.11)
RDW: 14.7 % (ref 11.5–15.5)
WBC: 13.6 10*3/uL — ABNORMAL HIGH (ref 4.0–10.5)

## 2018-02-15 LAB — SEDIMENTATION RATE: SED RATE: 89 mm/h — AB (ref 0–40)

## 2018-02-15 MED ORDER — LEVOFLOXACIN 500 MG PO TABS: 500.0000 mg | ORAL_TABLET | Freq: Every day | ORAL | 0 refills | Status: DC

## 2018-02-15 MED ORDER — IPRATROPIUM-ALBUTEROL 0.5-2.5 (3) MG/3ML IN SOLN: 3.0000 mL | Freq: Four times a day (QID) | RESPIRATORY_TRACT | 0 refills | Status: DC | PRN

## 2018-02-15 MED ORDER — PREDNISONE 20 MG PO TABS: 20.0000 mg | ORAL_TABLET | Freq: Every day | ORAL | 0 refills | Status: DC

## 2018-02-15 MED ORDER — HYDROCOD POLST-CPM POLST ER 10-8 MG/5ML PO SUER: 5.0000 mL | Freq: Every evening | ORAL | 0 refills | Status: DC | PRN

## 2018-02-15 MED ORDER — IPRATROPIUM-ALBUTEROL 0.5-2.5 (3) MG/3ML IN SOLN
3.0000 mL | Freq: Once | RESPIRATORY_TRACT | Status: AC
  Administered 2018-02-15: 3 mL via RESPIRATORY_TRACT

## 2018-02-15 NOTE — Telephone Encounter (Signed)
Copied from [place] [phone]. Topic: Quick Communication - See Telephone Encounter >> Feb 15, 2018 10:27 AM Conception Chancy, NT wrote: CRM for notification. See Telephone encounter for: 02/15/18.  Patient husband is calling and states that promethazine-codeine ([place]) 6.25-10 MG/5ML syrup is not working. She is still having a bad cough. He would like something stronger called in. He states if she needs to come in today he can bring her. He would like something before the weekend.   [id number] [address], [place] [address] Phone: [phone] Fax: [phone]

## 2018-02-15 NOTE — Telephone Encounter (Signed)
Noted../lmb 

## 2018-02-15 NOTE — Telephone Encounter (Signed)
Patient called back and was scheduled an appt with Jodi Mourning today (02/15/18) at 3

## 2018-02-15 NOTE — Progress Notes (Signed)
Sierra Johnson is a 67 y.o. female with the following history as recorded in Epic[employer]:  Patient Active Problem List   Diagnosis Date Noted  . Fever and chills 01/30/2018  . Headache 01/30/2018  . Asthmatic bronchitis 01/18/2018  . Upper respiratory infection 04/27/2017  . Insomnia 03/08/2016  . Eczema 04/13/2015  . Edema 02/11/2015  . S/P laparoscopic hernia repair 06/03/2014  . Knee osteoarthritis 04/30/2014  . Recurrent ventral incisional hernia 10/16/2012  . S/P repair of ventral hernia 08/28/2012  . Diabetes mellitus type 2 in obese ([place]) 05/07/2012  . Morbid obesity ([place]) 01/18/2012  . Well adult exam 04/17/2011  . Sinusitis, acute 04/17/2011  . SHOULDER PAIN 12/07/2009  . POLYURIA 12/07/2009  . Obstructive sleep apnea 09/30/2009  . FATIGUE 09/15/2009  . PARESTHESIA 06/08/2008  . Dyslipidemia 07/17/2007  . B12 deficiency 04/24/2007  . Anemia 04/24/2007  . Essential hypertension 04/24/2007    Current Outpatient Medications  Medication Sig Dispense Refill  . amoxicillin-clavulanate (AUGMENTIN) 875-125 MG tablet Take 1 tablet by mouth 2 (two) times daily. 20 tablet 0  . Cholecalciferol (VITAMIN D3) 1000 UNITS CAPS Take 1,000 Units by mouth daily.     . fish oil-omega-3 fatty acids 1000 MG capsule Take 1 g by mouth 2 (two) times daily.     [name] FREESTYLE INSULINX TEST test strip 1 EACH BY OTHER ROUTE 2 (TWO) TIMES DAILY. USE TO CHECK BLOOD SUGARS TWICE A DAY 200 each 1  . glucose monitoring kit (FREESTYLE) monitoring kit 1 each by Does not apply route 2 (two) times daily. Dx: E11.9, I10 1 each 0  . ibuprofen (ADVIL,MOTRIN) 600 MG tablet TAKE 1 TABLET (600 MG TOTAL) BY MOUTH 2 (TWO) TIMES DAILY AS NEEDED FOR MODERATE PAIN. 60 tablet 3  . Insulin Glargine (TOUJEO SOLOSTAR) 300 UNIT/ML SOPN Inject 18 Units into the skin every morning. 12 pen 1  . Insulin Pen Needle 31G X 5 MM MISC 1 Units by Does not apply route 4 (four) times daily. 50 each 11  . Lancets (FREESTYLE) lancets 1  each by Other route 2 (two) times daily. Dx: E11.9, I10 100 each 11  . metFORMIN (GLUCOPHAGE) 500 MG tablet TAKE 1 TABLET BY MOUTH 3 TIMES DAILY (Patient taking differently: Take 500 mg by mouth 3 (three) times daily. ) 270 tablet 3  . Spacer/Aero-Holding Chambers (AEROCHAMBER PLUS) inhaler Use as instructed 1 each 2  . triamterene-hydrochlorothiazide (MAXZIDE-25) 37.5-25 MG tablet Take 1 tablet by mouth daily. 90 tablet 3  . vitamin B-12 (CYANOCOBALAMIN) 1000 MCG tablet Take 1,000 mcg by mouth daily.      [name] albuterol (PROVENTIL HFA;VENTOLIN HFA) 108 (90 Base) MCG/ACT inhaler Inhale 2 puffs into the lungs every 4 (four) hours as needed for wheezing or shortness of breath. Dispense with aerochamber (Patient not taking: Reported on 02/04/2018) 1 Inhaler 0  . benzonatate (TESSALON) 100 MG capsule Take 1 capsule (100 mg total) by mouth every 8 (eight) hours. (Patient not taking: Reported on 02/04/2018) 21 capsule 0  . chlorpheniramine-HYDROcodone (TUSSIONEX PENNKINETIC ER) 10-8 MG/5ML SUER Take 5 mLs by mouth at bedtime as needed for cough. 115 mL 0  . ipratropium-albuterol (DUONEB) 0.5-2.5 (3) MG/3ML SOLN Take 3 mLs by nebulization every 6 (six) hours as needed. 360 mL 0  . levofloxacin (LEVAQUIN) 500 MG tablet Take 1 tablet (500 mg total) by mouth daily. 7 tablet 0  . predniSONE (DELTASONE) 20 MG tablet Take 1 tablet (20 mg total) by mouth daily with breakfast. 5 tablet 0  .  promethazine-codeine (PHENERGAN WITH CODEINE) 6.25-10 MG/5ML syrup Take 5 mLs by mouth every 4 (four) hours as needed. (Patient not taking: Reported on 02/04/2018) 300 mL 0   No current facility-administered medications for this visit.     Allergies: Amlodipine; Benazepril; and Tetracycline  Past Medical History:  Diagnosis Date  . ANEMIA-NOS 04/24/2007  . DIABETES MELLITUS, TYPE II 04/24/2007  . Elevated glucose 2011  . HYPERLIPIDEMIA 07/17/2007   mild  . HYPERTENSION 04/24/2007  . Menopause 1998   Dr. Gaetano Net  . OBSTRUCTIVE  SLEEP APNEA 09/30/2009   on CPAP 2011  . VITAMIN B12 DEFICIENCY 04/24/2007    Past Surgical History:  Procedure Laterality Date  . ABDOMINAL HYSTERECTOMY    . HERNIA REPAIR  11/11   Incar. midline hernia  . INCISIONAL HERNIA REPAIR N/A 06/03/2014   Procedure: LAPAROSCOPIC REPAIR RECURRENT  INCISIONAL HERNIA;  Surgeon: Georganna Skeans, MD;  Location: [facility];  Service: General;  Laterality: N/A;  . INSERTION OF MESH N/A 06/03/2014   Procedure: INSERTION OF MESH;  Surgeon: Georganna Skeans, MD;  Location: [facility];  Service: General;  Laterality: N/A;    Family History  Problem Relation Age of Onset  . Heart disease Mother 57       ?CAD  . Cancer Father        lung  . Heart disease Brother 55       CAD  . Hypertension Other     Social History   Tobacco Use  . Smoking status: Never Smoker  . Smokeless tobacco: Never Used  Substance Use Topics  . Alcohol use: No    Subjective:   Patient presents with her husband today; has been sick for the past month- originally diagnosed with asthmatic bronchitis approximately one month ago; has taken Zithromax and Augmentin with mild benefit- seemed to improve initially but symptoms have re-flared in the past 2 days; taking BREO daily for cough; + barking, persisting cough;  Scheduled to see ID on September 4 due to persisting elevated sed rate, creatinine;    Objective:  Vitals:   02/15/18 1516  BP: (!) 144/88  Pulse: (!) 123  Temp: 99.5 F (37.5 C)  TempSrc: Oral  SpO2: 96%  Weight: 223 lb 0.6 oz (101.2 kg)  Height: 5' (1.524 m)    General: Well developed, well nourished, in no acute distress  Skin : Warm and dry.  Head: Normocephalic and atraumatic  Eyes: Sclera and conjunctiva clear; pupils round and reactive to light; extraocular movements intact  Ears: External normal; canals clear; tympanic membranes normal  Oropharynx: Pink, supple. No suspicious lesions  Neck: Supple without thyromegaly, adenopathy  Lungs: Respirations  unlabored; clear to auscultation bilaterally without wheeze, rales, rhonchi  CVS exam: normal rate and regular rhythm.  Neurologic: Alert and oriented; speech intact; face symmetrical; moves all extremities well; CNII-XII intact without focal deficit   Assessment:  1. Persistent cough for 3 weeks or longer     Plan:  Update CXR and CBC today; no pneumonia seen; WBC is stable at 13 compared to 4 days ago; Duo-neb given in office with relief; Rx for Levaquin 500 mg qd x 7 days, Prednisone 20 mg qd x 5 days, nebulizer to use every 4-6 hours; follow-up with her PCP next week for follow-up; Strict ER precautions for upcoming holiday weekend.    Return for Dr. Felicie Morn on Tuesday.  Orders Placed This Encounter  Procedures  . DG Chest 2 View    Standing Status:   Future  Number of Occurrences:   1    Standing Expiration Date:   04/18/2019    Order Specific Question:   Reason for Exam (SYMPTOM  OR DIAGNOSIS REQUIRED)    Answer:   persistent cough    Order Specific Question:   Preferred imaging location?    Answer:   [facility]    Order Specific Question:   Radiology Contrast Protocol - do NOT remove file path    Answer:   [email or web address]  . CBC w/Diff    Standing Status:   Future    Number of Occurrences:   1    Standing Expiration Date:   02/15/2019    Requested Prescriptions   Signed Prescriptions Disp Refills  . chlorpheniramine-HYDROcodone (TUSSIONEX PENNKINETIC ER) 10-8 MG/5ML SUER 115 mL 0    Sig: Take 5 mLs by mouth at bedtime as needed for cough.  . predniSONE (DELTASONE) 20 MG tablet 5 tablet 0    Sig: Take 1 tablet (20 mg total) by mouth daily with breakfast.  . levofloxacin (LEVAQUIN) 500 MG tablet 7 tablet 0    Sig: Take 1 tablet (500 mg total) by mouth daily.  [name] ipratropium-albuterol (DUONEB) 0.5-2.5 (3) MG/3ML SOLN 360 mL 0    Sig: Take 3 mLs by nebulization every 6 (six) hours as needed.

## 2018-02-17 LAB — SEDIMENTATION RATE: SED RATE: 78 mm/h — AB (ref 0–40)

## 2018-02-17 LAB — SPECIMEN STATUS REPORT

## 2018-02-19 LAB — CULTURE BLOOD MANUAL
Micro Number: 91022534
Result: NO GROWTH
Specimen Quality: ADEQUATE

## 2018-02-20 ENCOUNTER — Encounter: Payer: Self-pay | Admitting: Internal Medicine

## 2018-02-20 ENCOUNTER — Encounter: Payer: Self-pay | Admitting: Family

## 2018-02-20 ENCOUNTER — Ambulatory Visit (INDEPENDENT_AMBULATORY_CARE_PROVIDER_SITE_OTHER): Payer: BLUE CROSS/BLUE SHIELD | Admitting: Internal Medicine

## 2018-02-20 ENCOUNTER — Ambulatory Visit (INDEPENDENT_AMBULATORY_CARE_PROVIDER_SITE_OTHER): Payer: BLUE CROSS/BLUE SHIELD | Admitting: Family

## 2018-02-20 VITALS — BP 96/44 | HR 106 | Temp 98.4°F | Ht 60.0 in | Wt 212.0 lb

## 2018-02-20 DIAGNOSIS — R509 Fever, unspecified: Secondary | ICD-10-CM

## 2018-02-20 DIAGNOSIS — R05 Cough: Secondary | ICD-10-CM | POA: Diagnosis not present

## 2018-02-20 DIAGNOSIS — J069 Acute upper respiratory infection, unspecified: Secondary | ICD-10-CM

## 2018-02-20 DIAGNOSIS — J4521 Mild intermittent asthma with (acute) exacerbation: Secondary | ICD-10-CM

## 2018-02-20 DIAGNOSIS — R059 Cough, unspecified: Secondary | ICD-10-CM

## 2018-02-20 MED ORDER — PROMETHAZINE-CODEINE 6.25-10 MG/5ML PO SYRP: 5.0000 mL | ORAL_SOLUTION | ORAL | 0 refills | Status: DC | PRN

## 2018-02-20 MED ORDER — IPRATROPIUM-ALBUTEROL 0.5-2.5 (3) MG/3ML IN SOLN: 3.0000 mL | Freq: Four times a day (QID) | RESPIRATORY_TRACT | 1 refills | Status: DC | PRN

## 2018-02-20 NOTE — Assessment & Plan Note (Addendum)
HA - resolved last week. Severe cough started on Wed last week. She started Levaquin and prednisone on Sat 9/1 and the fever normalized on Sun evening. Cough is a little better - using Duoneb HHN. CPAP machine helps to cough less. ID clinic appt is today  HHN Duoneb Prom-cod syr

## 2018-02-20 NOTE — Addendum Note (Signed)
Addended by: Karren Cobble on: 02/20/2018 04:50 PM   Modules accepted: Orders

## 2018-02-20 NOTE — Progress Notes (Signed)
Subjective:    Patient ID: Sierra Johnson, female    DOB: 12/23/1950, 67 y.o.   MRN: [id number]  Chief Complaint  Patient presents with  . New Patient (Initial Visit)    fever of unknown ; headaches, chills,cough    HPI:  Sierra Johnson is a 67 y.o. female who presents today for initial office visit for fevers.  [name] as initially seen in her primary care office on 01/18/18 with on-going cough for about 2 weeks and diagnosed with an acute upper respiratory infection and treated with a course of Azithromycin. She was then seen in the ED 2 days later with cough, fever, chills and nasal congestion. Fevers were noted to be as high as 101. On physical exam she had a fever of 100.4. Her chest x-ray with no evidence of pneumonia or acute cardiopulmomary problems. She was continued on the Azithromycin. On 01/30/18 she was once again seen in primary care with headache, chills, and fever this time fever of 101.6 every evening. She was given a prescription for Augmentin after completing the Azithromycin. She was seen for follow up on 8/19 and not improved. CT scan of her sinuses showed fluid in the sphenoid and maxillary sinuses with possible acute sinusitis. Temperature was 99 during her office visit. She was sent to the ED to rule out possibility of meningitis. Her temperature was 98.8. Diagnosed with a viral infection. She was seen on 8/30 with persistent cough and given a prescription for levofloxacin and prednisone.  She was seen today in primary care with her headache being resolved last week and fevers normalizing 3 days ago. Her cough was slightly improved with Duoneb. Her temperature is 98.4.   Blood work on 02/11/18 with a WBC count of 13.1 and ESR >130. Neutrophils were 84.7%. On 8/30 her WBC count remained stable at 13.6 with improved ESR of 78 and Neutrophils down to 77%.  A blood culture was performed on 02/11/18 with no growth after 7 days.   She reports taking the levofloxacin and  prednisone as prescribed with some improvements. Continues to have a phlegm that is yellow and sometimes clear. Her cough continues to remain. Her CPAP machine seems to help. She has been afebrile for about 72 hours now. Denies any fullness, burning or reflux. No recent travel. No history of tobacco use.   Allergies  Allergen Reactions  . Amlodipine     LE swelling on 10 mg  . Benazepril Swelling    Lip swelling  . Tetracycline Nausea And Vomiting      Outpatient Medications Prior to Visit  Medication Sig Dispense Refill  . albuterol (PROVENTIL HFA;VENTOLIN HFA) 108 (90 Base) MCG/ACT inhaler Inhale 2 puffs into the lungs every 4 (four) hours as needed for wheezing or shortness of breath. Dispense with aerochamber 1 Inhaler 0  . benzonatate (TESSALON) 100 MG capsule Take 1 capsule (100 mg total) by mouth every 8 (eight) hours. 21 capsule 0  . Cholecalciferol (VITAMIN D3) 1000 UNITS CAPS Take 1,000 Units by mouth daily.     . fish oil-omega-3 fatty acids 1000 MG capsule Take 1 g by mouth 2 (two) times daily.     [name] FREESTYLE INSULINX TEST test strip 1 EACH BY OTHER ROUTE 2 (TWO) TIMES DAILY. USE TO CHECK BLOOD SUGARS TWICE A DAY 200 each 1  . glucose monitoring kit (FREESTYLE) monitoring kit 1 each by Does not apply route 2 (two) times daily. Dx: E11.9, I10 1 each 0  . ibuprofen (  ADVIL,MOTRIN) 600 MG tablet TAKE 1 TABLET (600 MG TOTAL) BY MOUTH 2 (TWO) TIMES DAILY AS NEEDED FOR MODERATE PAIN. 60 tablet 3  . Insulin Glargine (TOUJEO SOLOSTAR) 300 UNIT/ML SOPN Inject 18 Units into the skin every morning. 12 pen 1  . Insulin Pen Needle 31G X 5 MM MISC 1 Units by Does not apply route 4 (four) times daily. 50 each 11  . ipratropium-albuterol (DUONEB) 0.5-2.5 (3) MG/3ML SOLN Take 3 mLs by nebulization every 6 (six) hours as needed. 360 mL 1  . Lancets (FREESTYLE) lancets 1 each by Other route 2 (two) times daily. Dx: E11.9, I10 100 each 11  . levofloxacin (LEVAQUIN) 500 MG tablet Take 1 tablet (500  mg total) by mouth daily. 7 tablet 0  . metFORMIN (GLUCOPHAGE) 500 MG tablet TAKE 1 TABLET BY MOUTH 3 TIMES DAILY (Patient taking differently: Take 500 mg by mouth 3 (three) times daily. ) 270 tablet 3  . predniSONE (DELTASONE) 20 MG tablet Take 1 tablet (20 mg total) by mouth daily with breakfast. 5 tablet 0  . Spacer/Aero-Holding Chambers (AEROCHAMBER PLUS) inhaler Use as instructed 1 each 2  . triamterene-hydrochlorothiazide (MAXZIDE-25) 37.5-25 MG tablet Take 1 tablet by mouth daily. 90 tablet 3  . vitamin B-12 (CYANOCOBALAMIN) 1000 MCG tablet Take 1,000 mcg by mouth daily.      . promethazine-codeine (PHENERGAN WITH CODEINE) 6.25-10 MG/5ML syrup Take 5 mLs by mouth every 4 (four) hours as needed. 300 mL 0   No facility-administered medications prior to visit.      Past Medical History:  Diagnosis Date  . ANEMIA-NOS 04/24/2007  . DIABETES MELLITUS, TYPE II 04/24/2007  . Elevated glucose 2011  . HYPERLIPIDEMIA 07/17/2007   mild  . HYPERTENSION 04/24/2007  . Menopause 1998   Dr. Gaetano Net  . OBSTRUCTIVE SLEEP APNEA 09/30/2009   on CPAP 2011  . VITAMIN B12 DEFICIENCY 04/24/2007      Past Surgical History:  Procedure Laterality Date  . ABDOMINAL HYSTERECTOMY    . HERNIA REPAIR  11/11   Incar. midline hernia  . INCISIONAL HERNIA REPAIR N/A 06/03/2014   Procedure: LAPAROSCOPIC REPAIR RECURRENT  INCISIONAL HERNIA;  Surgeon: Georganna Skeans, MD;  Location: [facility];  Service: General;  Laterality: N/A;  . INSERTION OF MESH N/A 06/03/2014   Procedure: INSERTION OF MESH;  Surgeon: Georganna Skeans, MD;  Location: [facility];  Service: General;  Laterality: N/A;      Family History  Problem Relation Age of Onset  . Heart disease Mother 57       ?CAD  . Cancer Father        lung  . Heart disease Brother 19       CAD  . Hypertension Other       Social History   Socioeconomic History  . Marital status: Married    Spouse name: Not on file  . Number of children: 2  . Years of education:  Not on file  . Highest education level: Not on file  Occupational History  . Occupation: Psychologist, occupational: [employer]  Social Needs  . Financial resource strain: Not on file  . Food insecurity:    Worry: Not on file    Inability: Not on file  . Transportation needs:    Medical: Not on file    Non-medical: Not on file  Tobacco Use  . Smoking status: Never Smoker  . Smokeless tobacco: Never Used  Substance and Sexual Activity  . Alcohol use: No  .  Drug use: No  . Sexual activity: Yes  Lifestyle  . Physical activity:    Days per week: Not on file    Minutes per session: Not on file  . Stress: Not on file  Relationships  . Social connections:    Talks on phone: Not on file    Gets together: Not on file    Attends religious service: Not on file    Active member of club or organization: Not on file    Attends meetings of clubs or organizations: Not on file    Relationship status: Not on file  . Intimate partner violence:    Fear of current or ex partner: Not on file    Emotionally abused: Not on file    Physically abused: Not on file    Forced sexual activity: Not on file  Other Topics Concern  . Not on file  Social History Narrative   Vegan      Review of Systems  Constitutional: Negative for chills, fatigue and fever.  HENT: Negative for congestion, sinus pressure, sinus pain, sneezing and sore throat.   Respiratory: Positive for cough and shortness of breath (occasional). Negative for chest tightness and wheezing.   Cardiovascular: Negative for chest pain.  Gastrointestinal: Negative for constipation, nausea and vomiting.       Objective:    BP (!) 96/44   Pulse (!) 106   Temp 98.4 F (36.9 C)   Ht 5' (1.524 m)   Wt 212 lb (96.2 kg)   BMI 41.40 kg/m  Nursing note and vital signs reviewed.  Physical Exam  Constitutional: She is oriented to person, place, and time. She appears well-developed and well-nourished. No distress.  Seated in  the chair; pleasant; drowsy  Cardiovascular: Normal rate, regular rhythm, normal heart sounds and intact distal pulses. Exam reveals no gallop and no friction rub.  No murmur heard. Pulmonary/Chest: Effort normal and breath sounds normal. No stridor. No respiratory distress. She has no wheezes. She has no rales. She exhibits no tenderness.  Abdominal: Soft. Bowel sounds are normal.  Neurological: She is alert and oriented to person, place, and time.  Skin: Skin is warm and dry.  Psychiatric: She has a normal mood and affect.        Assessment & Plan:   Problem List Items Addressed This Visit      Other   Cough - Primary    Sierra Johnson has had a occasionally productive cough going on for about 1.5 months now recently worsened within the past 1-2 weeks. This was accompanied by low grade fevers for about 3 weeks. Chest x-rays have been negative and has no history of asthma or tobacco usage. These symptoms have been refractory to courses of Azithromycin, Augmentin and is currently in the process of completing levofloxacin. Afebrile for the last 72 hours with no anti-pyretics. Her inflammatory markers appear to be improving. Blood cultures were negative. There is potential concern for atypical infection and I will check her sputum cultures as well as repeat her CBC today. May consider a CT of the chest or possible referral to pulmonology if symptoms persist for consideration of bronchoscopy. If this was a typical infection I would have expected this to have responded to the antibiotic therapy given the broad spectrum of coverage with this antibiotics. This could also be a prolonged viral infection as well. Continue supportive measures. Follow up pending blood work or sooner if needed if fever returns or cough worsens.  Relevant Orders   CBC w/Diff (Completed)   Sedimentation rate (Completed)   C-reactive protein (Completed)   MYCOBACTERIA, CULTURE, WITH FLUOROCHROME SMEAR   Respiratory or  Resp and Sputum Culture       I am having Sierra Johnson maintain her fish oil-omega-3 fatty acids, vitamin B-12, Vitamin D3, glucose monitoring kit, albuterol, AEROCHAMBER PLUS, Insulin Glargine, freestyle, Insulin Pen Needle, ibuprofen, metFORMIN, benzonatate, FREESTYLE INSULINX TEST, triamterene-hydrochlorothiazide, predniSONE, levofloxacin, and ipratropium-albuterol.   Follow-up: Return if symptoms worsen or fail to improve.    Terri Piedra, MSN, FNP-C Nurse Practitioner [facility] Office phone: [phone] Pager: [facility] number: [phone]

## 2018-02-20 NOTE — Assessment & Plan Note (Addendum)
?  etiology HA - resolved last week. Severe cough started on Wed last week. She started Levaquin and prednisone on Sat 9/1 and the fever normalized on Sun evening. Cough is a little better - using Duoneb HHN. CPAP machine helps to cough less.  ID consult later today To work on 9/18 if ok

## 2018-02-20 NOTE — Addendum Note (Signed)
Addended by: Cassandria Anger on: 02/20/2018 04:52 PM   Modules accepted: Orders

## 2018-02-20 NOTE — Patient Instructions (Signed)
Nice to meet you.  We will check your blood work and order sputum cultures today.  Continue to take your levofloxacin until completed.  Continue to use the albuterol as prescribed.  May consider a pulmonary function test if your symptoms continue to persist.  Follow up pending your culture results and blood work.

## 2018-02-20 NOTE — Progress Notes (Signed)
Subjective:  Patient ID: Sierra Johnson, female    DOB: [birth year]  Age: 67 y.o. MRN: [id number]  CC: No chief complaint on file.   HPI Sierra Johnson presents for HA - resolved last week. Severe cough started on Wed last week. She started Levaquin and prednisone on Sat 9/1 and the fever normalized on Sun evening. Cough is a little better - using Duoneb HHN. CPAP machine helps to cough less.  Outpatient Medications Prior to Visit  Medication Sig Dispense Refill  . albuterol (PROVENTIL HFA;VENTOLIN HFA) 108 (90 Base) MCG/ACT inhaler Inhale 2 puffs into the lungs every 4 (four) hours as needed for wheezing or shortness of breath. Dispense with aerochamber 1 Inhaler 0  . benzonatate (TESSALON) 100 MG capsule Take 1 capsule (100 mg total) by mouth every 8 (eight) hours. 21 capsule 0  . chlorpheniramine-HYDROcodone (TUSSIONEX PENNKINETIC ER) 10-8 MG/5ML SUER Take 5 mLs by mouth at bedtime as needed for cough. 115 mL 0  . Cholecalciferol (VITAMIN D3) 1000 UNITS CAPS Take 1,000 Units by mouth daily.     . fish oil-omega-3 fatty acids 1000 MG capsule Take 1 g by mouth 2 (two) times daily.     [name] FREESTYLE INSULINX TEST test strip 1 EACH BY OTHER ROUTE 2 (TWO) TIMES DAILY. USE TO CHECK BLOOD SUGARS TWICE A DAY 200 each 1  . glucose monitoring kit (FREESTYLE) monitoring kit 1 each by Does not apply route 2 (two) times daily. Dx: E11.9, I10 1 each 0  . ibuprofen (ADVIL,MOTRIN) 600 MG tablet TAKE 1 TABLET (600 MG TOTAL) BY MOUTH 2 (TWO) TIMES DAILY AS NEEDED FOR MODERATE PAIN. 60 tablet 3  . Insulin Glargine (TOUJEO SOLOSTAR) 300 UNIT/ML SOPN Inject 18 Units into the skin every morning. 12 pen 1  . Insulin Pen Needle 31G X 5 MM MISC 1 Units by Does not apply route 4 (four) times daily. 50 each 11  . ipratropium-albuterol (DUONEB) 0.5-2.5 (3) MG/3ML SOLN Take 3 mLs by nebulization every 6 (six) hours as needed. 360 mL 0  . Lancets (FREESTYLE) lancets 1 each by Other route 2 (two) times daily. Dx: E11.9,  I10 100 each 11  . levofloxacin (LEVAQUIN) 500 MG tablet Take 1 tablet (500 mg total) by mouth daily. 7 tablet 0  . metFORMIN (GLUCOPHAGE) 500 MG tablet TAKE 1 TABLET BY MOUTH 3 TIMES DAILY (Patient taking differently: Take 500 mg by mouth 3 (three) times daily. ) 270 tablet 3  . predniSONE (DELTASONE) 20 MG tablet Take 1 tablet (20 mg total) by mouth daily with breakfast. 5 tablet 0  . promethazine-codeine (PHENERGAN WITH CODEINE) 6.25-10 MG/5ML syrup Take 5 mLs by mouth every 4 (four) hours as needed. 300 mL 0  . Spacer/Aero-Holding Chambers (AEROCHAMBER PLUS) inhaler Use as instructed 1 each 2  . triamterene-hydrochlorothiazide (MAXZIDE-25) 37.5-25 MG tablet Take 1 tablet by mouth daily. 90 tablet 3  . vitamin B-12 (CYANOCOBALAMIN) 1000 MCG tablet Take 1,000 mcg by mouth daily.      [name] amoxicillin-clavulanate (AUGMENTIN) 875-125 MG tablet Take 1 tablet by mouth 2 (two) times daily. (Patient not taking: Reported on 02/20/2018) 20 tablet 0   No facility-administered medications prior to visit.     ROS: Review of Systems  Constitutional: Positive for fatigue. Negative for activity change, appetite change, chills and unexpected weight change.  HENT: Negative for congestion, mouth sores and sinus pressure.   Eyes: Negative for visual disturbance.  Respiratory: Positive for cough. Negative for chest tightness and shortness of breath.  Gastrointestinal: Negative for abdominal pain and nausea.  Genitourinary: Negative for difficulty urinating, frequency and vaginal pain.  Musculoskeletal: Negative for back pain and gait problem.  Skin: Negative for pallor and rash.  Neurological: Negative for dizziness, tremors, weakness, numbness and headaches.  Psychiatric/Behavioral: Negative for confusion and sleep disturbance.    Objective:  BP 138/88 (BP Location: Left Arm, Patient Position: Sitting, Cuff Size: Large)   Pulse 91   Temp 98.4 F (36.9 C) (Oral)   Ht 5' (1.524 m)   Wt 223 lb (101.2 kg)    SpO2 94%   BMI 43.55 kg/m   BP Readings from Last 3 Encounters:  02/20/18 138/88  02/15/18 (!) 144/88  02/04/18 110/68    Wt Readings from Last 3 Encounters:  02/20/18 223 lb (101.2 kg)  02/15/18 223 lb 0.6 oz (101.2 kg)  02/04/18 220 lb (99.8 kg)    Physical Exam  Constitutional: She appears well-developed. No distress.  HENT:  Head: Normocephalic.  Right Ear: External ear normal.  Left Ear: External ear normal.  Nose: Nose normal.  Mouth/Throat: Oropharynx is clear and moist.  Eyes: Pupils are equal, round, and reactive to light. Conjunctivae are normal. Right eye exhibits no discharge. Left eye exhibits no discharge.  Neck: Normal range of motion. Neck supple. No JVD present. No tracheal deviation present. No thyromegaly present.  Cardiovascular: Normal rate, regular rhythm and normal heart sounds.  Pulmonary/Chest: No stridor. No respiratory distress. She has no wheezes.  Abdominal: Soft. Bowel sounds are normal. She exhibits no distension and no mass. There is no tenderness. There is no rebound and no guarding.  Musculoskeletal: She exhibits no edema or tenderness.  Lymphadenopathy:    She has no cervical adenopathy.  Neurological: She displays normal reflexes. No cranial nerve deficit. She exhibits normal muscle tone. Coordination normal.  Skin: No rash noted. No erythema.  Psychiatric: She has a normal mood and affect. Her behavior is normal. Judgment and thought content normal.  coughing hard - dry cough NAD  Lab Results  Component Value Date   WBC 13.6 (H) 02/15/2018   HGB 12.1 02/15/2018   HCT 36.7 02/15/2018   PLT 382.0 02/15/2018   GLUCOSE 178 (H) 02/11/2018   CHOL 220 (H) 04/27/2017   TRIG 60.0 04/27/2017   HDL 79.80 04/27/2017   LDLDIRECT 119.5 08/27/2012   LDLCALC 128 (H) 04/27/2017   ALT 33 02/11/2018   AST 24 02/11/2018   NA 130 (L) 02/11/2018   K 4.0 02/11/2018   CL 97 02/11/2018   CREATININE 1.28 (H) 02/11/2018   BUN 13 02/11/2018   CO2 23  02/11/2018   TSH 1.41 04/27/2017   HGBA1C 6.6 (H) 04/27/2017   MICROALBUR 37.9 (H) 04/27/2017    Dg Chest 2 View  Result Date: 02/15/2018 CLINICAL DATA:  Persistent cough and low-grade fever for 3 weeks, diabetes mellitus, hypertension EXAM: CHEST - 2 VIEW COMPARISON:  02/04/2018 FINDINGS: Normal heart size, mediastinal contours, and pulmonary vascularity. Lungs clear. No pulmonary infiltrate, pleural effusion or pneumothorax. Multilevel endplate spur formation thoracic spine. IMPRESSION: No acute abnormalities. Electronically Signed   By: Lavonia Dana M.D.   On: 02/15/2018 16:19    Assessment & Plan:   There are no diagnoses linked to this encounter.   No orders of the defined types were placed in this encounter.    Follow-up: No follow-ups on file.  Walker Kehr, MD

## 2018-02-20 NOTE — Assessment & Plan Note (Signed)
To work on 9/18 if ok HHN Duoneb Prom-cod syr

## 2018-02-21 ENCOUNTER — Encounter: Payer: Self-pay | Admitting: Family

## 2018-02-21 ENCOUNTER — Other Ambulatory Visit: Payer: BLUE CROSS/BLUE SHIELD

## 2018-02-21 DIAGNOSIS — R053 Chronic cough: Secondary | ICD-10-CM | POA: Insufficient documentation

## 2018-02-21 DIAGNOSIS — R059 Cough, unspecified: Secondary | ICD-10-CM | POA: Insufficient documentation

## 2018-02-21 DIAGNOSIS — R05 Cough: Secondary | ICD-10-CM | POA: Insufficient documentation

## 2018-02-21 LAB — CBC WITH DIFFERENTIAL/PLATELET
Basophils Absolute: 42 cells/uL (ref 0–200)
Basophils Relative: 0.4 %
Eosinophils Absolute: 11 cells/uL — ABNORMAL LOW (ref 15–500)
Eosinophils Relative: 0.1 %
HCT: 36.2 % (ref 35.0–45.0)
Hemoglobin: 11.9 g/dL (ref 11.7–15.5)
Lymphs Abs: 1313 cells/uL (ref 850–3900)
MCH: 27.3 pg (ref 27.0–33.0)
MCHC: 32.9 g/dL (ref 32.0–36.0)
MCV: 83 fL (ref 80.0–100.0)
MPV: 9 fL (ref 7.5–12.5)
Monocytes Relative: 4.8 %
Neutro Abs: 8631 cells/uL — ABNORMAL HIGH (ref 1500–7800)
Neutrophils Relative %: 82.2 %
Platelets: 418 10*3/uL — ABNORMAL HIGH (ref 140–400)
RBC: 4.36 10*6/uL (ref 3.80–5.10)
RDW: 13.4 % (ref 11.0–15.0)
Total Lymphocyte: 12.5 %
WBC mixed population: 504 cells/uL (ref 200–950)
WBC: 10.5 10*3/uL (ref 3.8–10.8)

## 2018-02-21 LAB — SEDIMENTATION RATE: Sed Rate: 101 mm/h — ABNORMAL HIGH (ref 0–30)

## 2018-02-21 LAB — C-REACTIVE PROTEIN: CRP: 13 mg/L — ABNORMAL HIGH (ref <8.0)

## 2018-02-21 NOTE — Assessment & Plan Note (Addendum)
Sierra Johnson has had a occasionally productive cough going on for about 1.5 months now recently worsened within the past 1-2 weeks. This was accompanied by low grade fevers for about 3 weeks. Chest x-rays have been negative and has no history of asthma or tobacco usage. These symptoms have been refractory to courses of Azithromycin, Augmentin and is currently in the process of completing levofloxacin. Afebrile for the last 72 hours with no anti-pyretics. Her inflammatory markers appear to be improving. Blood cultures were negative. There is potential concern for atypical infection and I will check her sputum cultures as well as repeat her CBC today. May consider a CT of the chest or possible referral to pulmonology if symptoms persist for consideration of bronchoscopy. If this was a typical infection I would have expected this to have responded to the antibiotic therapy given the broad spectrum of coverage with this antibiotics. This could also be a prolonged viral infection as well. Continue supportive measures. Follow up pending blood work or sooner if needed if fever returns or cough worsens.

## 2018-02-23 ENCOUNTER — Ambulatory Visit (INDEPENDENT_AMBULATORY_CARE_PROVIDER_SITE_OTHER): Payer: BLUE CROSS/BLUE SHIELD | Admitting: Family Medicine

## 2018-02-23 ENCOUNTER — Encounter: Payer: Self-pay | Admitting: Family Medicine

## 2018-02-23 VITALS — BP 102/74 | HR 95 | Temp 98.5°F | Resp 16 | Wt 217.0 lb

## 2018-02-23 DIAGNOSIS — R05 Cough: Secondary | ICD-10-CM

## 2018-02-23 DIAGNOSIS — R059 Cough, unspecified: Secondary | ICD-10-CM

## 2018-02-23 DIAGNOSIS — R509 Fever, unspecified: Secondary | ICD-10-CM | POA: Diagnosis not present

## 2018-02-23 NOTE — Progress Notes (Signed)
ACUTE VISIT  HPI:  Chief Complaint  Patient presents with  . Cough    productive x6 wks  . Fever  . Chills  . Head Injury    Sierra Johnson is a 67 y.o.female here today with her husband complaining of 6 weeks of cough and intermittent fever.  She is following with ID, she is not interested in follow-up, she states that provider "did not have any experience, I was not impressed." Upon reviewing records she has seen by her PCP on 01/18/2018 when she was complaining about 2 weeks of persistent cough. She was initially treated with azithromycin, which did not help. She presented to the ED on 01/20/2018 with similar complaints, she was instructed to complete antibiotic treatment. She also completed Augmentin and yesterday she took the last dose of levofloxacin.  While she is taking antibiotics we will improves, this morning temperature was 99.0 F.  According to husband, the plan with PCP/ID was to obtain a chest CT if fever was persistent after completing levofloxacin, so he would like this to be ordered today.  Symptoms have been intermittent but otherwise stable. She denies dyspnea, chest pain, wheezing, abdominal pain, changes in bowel habits, urinary symptoms, or any skin rash.  CXR done on 01/20/2018, 02/04/2018, 02/15/2018, all reported no acute abnormalities. On 01/30/2018 she had a maxillofacial CT done: Fluid in the sphenoid and maxillary sinuses may indicate acute sinusitis.  Generalized dull headache, exacerbated by cough, also for 6 weeks intermittent. Not associated visual changes, nausea, vomiting, or focal deficit.  Lab Results  Component Value Date   WBC 10.5 02/20/2018   HGB 11.9 02/20/2018   HCT 36.2 02/20/2018   MCV 83.0 02/20/2018   PLT 418 (H) 02/20/2018   Lab Results  Component Value Date   CREATININE 1.28 (H) 02/11/2018   BUN 13 02/11/2018   NA 130 (L) 02/11/2018   K 4.0 02/11/2018   CL 97 02/11/2018   CO2 23 02/11/2018    Lab Results    Component Value Date   ESRSEDRATE 101 (H) 02/20/2018   ESR was > 120 about 12 days ago.  Lab Results  Component Value Date   ALT 33 02/11/2018   AST 24 02/11/2018   ALKPHOS 65 02/11/2018   BILITOT 0.4 02/11/2018   She is also taking prednisone, on DuoNeb 4 times daily as needed, albuterol inhaler. She is taking Phenergan with codeine to help with coughing.    Cough  This is a recurrent problem. The current episode started more than 1 month ago. The cough is productive of sputum. Associated symptoms include chills, a fever, headaches, nasal congestion and postnasal drip. Pertinent negatives include no chest pain, ear pain, eye redness, heartburn, hemoptysis, rash, rhinorrhea, sore throat, shortness of breath, sweats, weight loss or wheezing. She has tried prescription cough suppressant, rest and a beta-agonist inhaler for the symptoms. The treatment provided mild relief. There is no history of asthma.  Fever   This is a recurrent problem. The current episode started more than 1 month ago. The problem occurs intermittently. The problem has been unchanged. The maximum temperature noted was 101 to 101.9 F. Associated symptoms include congestion, coughing and headaches. Pertinent negatives include no abdominal pain, chest pain, diarrhea, ear pain, nausea, rash, sore throat, urinary pain, vomiting or wheezing. She has tried nothing for the symptoms.  Risk factors: no contaminated food, no occupational exposure, no recent travel and no sick contacts     No Hx of  recent travel. No sick contact. No known insect bite.  Hx of allergies: Not known.    Review of Systems  Constitutional: Positive for activity change, appetite change, chills, fatigue and fever. Negative for unexpected weight change and weight loss.  HENT: Positive for congestion and postnasal drip. Negative for ear pain, mouth sores, rhinorrhea, sinus pressure, sore throat, trouble swallowing and voice change.   Eyes: Negative  for redness and visual disturbance.  Respiratory: Positive for cough. Negative for hemoptysis, shortness of breath and wheezing.   Cardiovascular: Negative for chest pain, palpitations and leg swelling.  Gastrointestinal: Negative for abdominal pain, diarrhea, heartburn, nausea and vomiting.  Endocrine: Negative for cold intolerance and heat intolerance.  Genitourinary: Negative for decreased urine volume, dysuria and hematuria.  Musculoskeletal: Negative for gait problem, joint swelling and neck pain.  Skin: Negative for rash.  Neurological: Positive for headaches. Negative for syncope, facial asymmetry and weakness.  Hematological: Negative for adenopathy.  Psychiatric/Behavioral: Negative for confusion. The patient is nervous/anxious.       Current Outpatient Medications on File Prior to Visit  Medication Sig Dispense Refill  . albuterol (PROVENTIL HFA;VENTOLIN HFA) 108 (90 Base) MCG/ACT inhaler Inhale 2 puffs into the lungs every 4 (four) hours as needed for wheezing or shortness of breath. Dispense with aerochamber 1 Inhaler 0  . benzonatate (TESSALON) 100 MG capsule Take 1 capsule (100 mg total) by mouth every 8 (eight) hours. 21 capsule 0  . Cholecalciferol (VITAMIN D3) 1000 UNITS CAPS Take 1,000 Units by mouth daily.     . fish oil-omega-3 fatty acids 1000 MG capsule Take 1 g by mouth 2 (two) times daily.     [name] FREESTYLE INSULINX TEST test strip 1 EACH BY OTHER ROUTE 2 (TWO) TIMES DAILY. USE TO CHECK BLOOD SUGARS TWICE A DAY 200 each 1  . glucose monitoring kit (FREESTYLE) monitoring kit 1 each by Does not apply route 2 (two) times daily. Dx: E11.9, I10 1 each 0  . ibuprofen (ADVIL,MOTRIN) 600 MG tablet TAKE 1 TABLET (600 MG TOTAL) BY MOUTH 2 (TWO) TIMES DAILY AS NEEDED FOR MODERATE PAIN. 60 tablet 3  . Insulin Glargine (TOUJEO SOLOSTAR) 300 UNIT/ML SOPN Inject 18 Units into the skin every morning. 12 pen 1  . Insulin Pen Needle 31G X 5 MM MISC 1 Units by Does not apply route 4  (four) times daily. 50 each 11  . ipratropium-albuterol (DUONEB) 0.5-2.5 (3) MG/3ML SOLN Take 3 mLs by nebulization every 6 (six) hours as needed. 360 mL 1  . Lancets (FREESTYLE) lancets 1 each by Other route 2 (two) times daily. Dx: E11.9, I10 100 each 11  . levofloxacin (LEVAQUIN) 500 MG tablet Take 1 tablet (500 mg total) by mouth daily. 7 tablet 0  . metFORMIN (GLUCOPHAGE) 500 MG tablet TAKE 1 TABLET BY MOUTH 3 TIMES DAILY (Patient taking differently: Take 500 mg by mouth 3 (three) times daily. ) 270 tablet 3  . predniSONE (DELTASONE) 20 MG tablet Take 1 tablet (20 mg total) by mouth daily with breakfast. 5 tablet 0  . promethazine-codeine (PHENERGAN WITH CODEINE) 6.25-10 MG/5ML syrup Take 5 mLs by mouth every 4 (four) hours as needed. 300 mL 0  . Spacer/Aero-Holding Chambers (AEROCHAMBER PLUS) inhaler Use as instructed 1 each 2  . triamterene-hydrochlorothiazide (MAXZIDE-25) 37.5-25 MG tablet Take 1 tablet by mouth daily. 90 tablet 3  . vitamin B-12 (CYANOCOBALAMIN) 1000 MCG tablet Take 1,000 mcg by mouth daily.       No current facility-administered medications on file  prior to visit.      Past Medical History:  Diagnosis Date  . ANEMIA-NOS 04/24/2007  . DIABETES MELLITUS, TYPE II 04/24/2007  . Elevated glucose 2011  . HYPERLIPIDEMIA 07/17/2007   mild  . HYPERTENSION 04/24/2007  . Menopause 1998   Dr. Gaetano Net  . OBSTRUCTIVE SLEEP APNEA 09/30/2009   on CPAP 2011  . VITAMIN B12 DEFICIENCY 04/24/2007   Allergies  Allergen Reactions  . Amlodipine     LE swelling on 10 mg  . Benazepril Swelling    Lip swelling  . Tetracycline Nausea And Vomiting    Social History   Socioeconomic History  . Marital status: Married    Spouse name: Not on file  . Number of children: 2  . Years of education: Not on file  . Highest education level: Not on file  Occupational History  . Occupation: Psychologist, occupational: [employer]  Social Needs  . Financial resource strain: Not on  file  . Food insecurity:    Worry: Not on file    Inability: Not on file  . Transportation needs:    Medical: Not on file    Non-medical: Not on file  Tobacco Use  . Smoking status: Never Smoker  . Smokeless tobacco: Never Used  Substance and Sexual Activity  . Alcohol use: No  . Drug use: No  . Sexual activity: Yes  Lifestyle  . Physical activity:    Days per week: Not on file    Minutes per session: Not on file  . Stress: Not on file  Relationships  . Social connections:    Talks on phone: Not on file    Gets together: Not on file    Attends religious service: Not on file    Active member of club or organization: Not on file    Attends meetings of clubs or organizations: Not on file    Relationship status: Not on file  Other Topics Concern  . Not on file  Social History Narrative   Vegan    Vitals:   02/23/18 1230  BP: 102/74  Pulse: 95  Resp: 16  Temp: 98.5 F (36.9 C)  SpO2: 95%   Body mass index is 42.38 kg/m.   Physical Exam  Nursing note and vitals reviewed. Constitutional: She is oriented to person, place, and time. She appears well-developed. No distress.  HENT:  Head: Normocephalic and atraumatic.  Mouth/Throat: Oropharynx is clear and moist and mucous membranes are normal.  Eyes: Pupils are equal, round, and reactive to light. Conjunctivae are normal.  Neck: Neck supple.  Cardiovascular: Normal rate and regular rhythm.  No murmur heard. Respiratory: Effort normal and breath sounds normal. No respiratory distress.  GI: Soft. She exhibits no mass. There is no hepatomegaly. There is no tenderness.  Musculoskeletal: She exhibits no edema.  Lymphadenopathy:    She has no cervical adenopathy.  Neurological: She is alert and oriented to person, place, and time. She has normal strength. Gait normal.  Skin: Skin is warm. No rash noted. No erythema.  Psychiatric: Her affect is blunt.  Well groomed, poor eye contact.      ASSESSMENT AND  PLAN:  Sierra Johnson was seen today for cough, fever,and chills.  Diagnoses and all orders for this visit:  Cough  We discussed possible etiologies. She did not cough during the visit, normal respiratory rate, normal lung auscultation, and no in respiratory distress. Follow-up with PCP as planned.  Explained that I do not think  we need to do stat chest CT, given the fact she is clinically stable. She was clearly instructed about warning signs, she and her husband voiced understanding.   Fever of unknown origin  Intermittent for 6 weeks. Antibiotics have not resolved problem, so it may not be bacterial.  I do not think is appropriate to prescribe a fourth course of antibiotics.  Examination today do not suggest a serious neurologic/cardiac infection process.  ?  Autoimmune disorder, atypical infectious disease, and malignancy among some to be considered. She can continue acetaminophen 500 mg 3 times daily as needed. Continue monitoring temperature. Clearly instructed about warning signs.  She is not interested in continue following with ID, she would like to let her PCP know at this visit and to proceed with chest CT as planned (per patient).     12:13 pm to 12:57 pm face to face OV. > 50% was dedicated to discussion of differential Dx, warning signs that would warrant ER evaluation, and some side effects of abx. Explained that given the fact symptoms have not resolved after 3 courses of antibiotics, I do not think longer treatment with Levofloxacin will help.    Betty G. Martinique, MD  [facility]. [place] office.

## 2018-02-23 NOTE — Patient Instructions (Addendum)
  Sierra Johnson I have seen you today for an acute visit.  A few things to remember from today's visit:   Cough  Fever of unknown origin   Since you are not in acute distress and oxygen is appropriate ,so I do not think you need to go to the ER. It sudden worsening of respiratory symptoms or fever 80 F that does not respond to Acetaminophen you need to go to the ER. I will send note to yur PCP as requested.      In general please monitor for signs of worsening symptoms and seek immediate medical attention if any concerning.   I hope you get better soon!

## 2018-02-26 ENCOUNTER — Encounter: Payer: Self-pay | Admitting: Internal Medicine

## 2018-02-26 ENCOUNTER — Ambulatory Visit (INDEPENDENT_AMBULATORY_CARE_PROVIDER_SITE_OTHER): Payer: BLUE CROSS/BLUE SHIELD | Admitting: Internal Medicine

## 2018-02-26 ENCOUNTER — Telehealth: Payer: Self-pay | Admitting: Internal Medicine

## 2018-02-26 ENCOUNTER — Other Ambulatory Visit (INDEPENDENT_AMBULATORY_CARE_PROVIDER_SITE_OTHER): Payer: BLUE CROSS/BLUE SHIELD

## 2018-02-26 ENCOUNTER — Telehealth: Payer: Self-pay | Admitting: *Deleted

## 2018-02-26 DIAGNOSIS — R059 Cough, unspecified: Secondary | ICD-10-CM

## 2018-02-26 DIAGNOSIS — R05 Cough: Secondary | ICD-10-CM

## 2018-02-26 DIAGNOSIS — R0602 Shortness of breath: Secondary | ICD-10-CM

## 2018-02-26 DIAGNOSIS — R51 Headache: Secondary | ICD-10-CM | POA: Diagnosis not present

## 2018-02-26 DIAGNOSIS — R519 Headache, unspecified: Secondary | ICD-10-CM

## 2018-02-26 DIAGNOSIS — R509 Fever, unspecified: Secondary | ICD-10-CM

## 2018-02-26 DIAGNOSIS — R634 Abnormal weight loss: Secondary | ICD-10-CM

## 2018-02-26 LAB — BASIC METABOLIC PANEL
BUN: 15 mg/dL (ref 6–23)
CO2: 25 mEq/L (ref 19–32)
Calcium: 9.5 mg/dL (ref 8.4–10.5)
Chloride: 87 mEq/L — ABNORMAL LOW (ref 96–112)
Creatinine, Ser: 1.16 mg/dL (ref 0.40–1.20)
GFR: 60.34 mL/min (ref >60.00)
GLUCOSE: 160 mg/dL — AB (ref 70–99)
POTASSIUM: 3.3 meq/L — AB (ref 3.5–5.1)
SODIUM: 121 meq/L — AB (ref 135–145)

## 2018-02-26 LAB — SEDIMENTATION RATE: SED RATE: 130 mm/h — AB (ref 0–30)

## 2018-02-26 NOTE — Assessment & Plan Note (Addendum)
FUO Relapsed  Labs. 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 They make cardboard boxes.

## 2018-02-26 NOTE — Assessment & Plan Note (Addendum)
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 They make cardboard boxes.

## 2018-02-26 NOTE — Assessment & Plan Note (Signed)
Relapsed  Labs

## 2018-02-26 NOTE — Telephone Encounter (Signed)
Copied from [place] [id number]. Topic: General - Other >> Feb 26, 2018  1:39 PM Lennox Solders wrote: Reason for CRM: patient talk with [employer]. And they need detail letter on [place] with patient booking number [phone] and her name type also on the letter needs to include her husband name roy Shetley and the medical reason for cancellation.   Pt saw [facility] today and would like a pick up the letter today

## 2018-02-26 NOTE — Progress Notes (Signed)
Subjective:  Patient ID: Sierra Johnson, female    DOB: July 18, 1950  Age: 67 y.o. MRN: [id number]  CC: No chief complaint on file.   HPI [address] presents for ferver T 100 relapsed on Fri; HA 5/10; cough achy. Not doing well. Lost wt per pt. 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 They make cardboard boxes.   Outpatient Medications Prior to Visit  Medication Sig Dispense Refill  . albuterol (PROVENTIL HFA;VENTOLIN HFA) 108 (90 Base) MCG/ACT inhaler Inhale 2 puffs into the lungs every 4 (four) hours as needed for wheezing or shortness of breath. Dispense with aerochamber 1 Inhaler 0  . benzonatate (TESSALON) 100 MG capsule Take 1 capsule (100 mg total) by mouth every 8 (eight) hours. 21 capsule 0  . Cholecalciferol (VITAMIN D3) 1000 UNITS CAPS Take 1,000 Units by mouth daily.     . fish oil-omega-3 fatty acids 1000 MG capsule Take 1 g by mouth 2 (two) times daily.     [name] FREESTYLE INSULINX TEST test strip 1 EACH BY OTHER ROUTE 2 (TWO) TIMES DAILY. USE TO CHECK BLOOD SUGARS TWICE A DAY 200 each 1  . glucose monitoring kit (FREESTYLE) monitoring kit 1 each by Does not apply route 2 (two) times daily. Dx: E11.9, I10 1 each 0  . ibuprofen (ADVIL,MOTRIN) 600 MG tablet TAKE 1 TABLET (600 MG TOTAL) BY MOUTH 2 (TWO) TIMES DAILY AS NEEDED FOR MODERATE PAIN. 60 tablet 3  . Insulin Glargine (TOUJEO SOLOSTAR) 300 UNIT/ML SOPN Inject 18 Units into the skin every morning. 12 pen 1  . Insulin Pen Needle 31G X 5 MM MISC 1 Units by Does not apply route 4 (four) times daily. 50 each 11  . ipratropium-albuterol (DUONEB) 0.5-2.5 (3) MG/3ML SOLN Take 3 mLs by nebulization every 6 (six) hours as needed. 360 mL 1  . Lancets (FREESTYLE) lancets 1 each by Other route 2 (two) times daily. Dx: E11.9, I10 100 each 11  . levofloxacin (LEVAQUIN) 500 MG tablet Take 1 tablet (500 mg total) by mouth daily. 7 tablet 0  . metFORMIN  (GLUCOPHAGE) 500 MG tablet TAKE 1 TABLET BY MOUTH 3 TIMES DAILY (Patient taking differently: Take 500 mg by mouth 3 (three) times daily. ) 270 tablet 3  . predniSONE (DELTASONE) 20 MG tablet Take 1 tablet (20 mg total) by mouth daily with breakfast. 5 tablet 0  . promethazine-codeine (PHENERGAN WITH CODEINE) 6.25-10 MG/5ML syrup Take 5 mLs by mouth every 4 (four) hours as needed. 300 mL 0  . Spacer/Aero-Holding Chambers (AEROCHAMBER PLUS) inhaler Use as instructed 1 each 2  . triamterene-hydrochlorothiazide (MAXZIDE-25) 37.5-25 MG tablet Take 1 tablet by mouth daily. 90 tablet 3  . vitamin B-12 (CYANOCOBALAMIN) 1000 MCG tablet Take 1,000 mcg by mouth daily.       No facility-administered medications prior to visit.     ROS: Review of Systems  Constitutional: Positive for chills, fatigue and fever. Negative for activity change, appetite change and unexpected weight change.  HENT: Negative for congestion, mouth sores and sinus pressure.   Eyes: Negative for visual disturbance.  Respiratory: Positive for cough and shortness of breath. Negative for chest tightness.   Gastrointestinal: Positive for nausea. Negative for abdominal pain.  Genitourinary: Negative for difficulty urinating, frequency and vaginal pain.  Musculoskeletal: Positive for arthralgias. Negative for back pain and gait problem.  Skin: Negative for pallor and rash.  Neurological: Positive  for weakness and headaches. Negative for dizziness, tremors and numbness.  Psychiatric/Behavioral: Negative for confusion, sleep disturbance and suicidal ideas.    Objective:  BP 104/72 (BP Location: Left Arm, Patient Position: Sitting, Cuff Size: Large)   Pulse 88   Temp 97.7 F (36.5 C) (Oral)   Ht 5' (1.524 m)   Wt 221 lb (100.2 kg)   SpO2 97%   BMI 43.16 kg/m   BP Readings from Last 3 Encounters:  02/26/18 104/72  02/23/18 102/74  02/20/18 (!) 96/44    Wt Readings from Last 3 Encounters:  02/26/18 221 lb (100.2 kg)    02/23/18 217 lb (98.4 kg)  02/20/18 212 lb (96.2 kg)    Physical Exam  Constitutional: She appears well-developed. No distress.  HENT:  Head: Normocephalic.  Right Ear: External ear normal.  Left Ear: External ear normal.  Nose: Nose normal.  Mouth/Throat: Oropharynx is clear and moist.  Eyes: Pupils are equal, round, and reactive to light. Conjunctivae are normal. Right eye exhibits no discharge. Left eye exhibits no discharge.  Neck: Normal range of motion. Neck supple. No JVD present. No tracheal deviation present. No thyromegaly present.  Cardiovascular: Normal rate, regular rhythm and normal heart sounds.  Pulmonary/Chest: No stridor. No respiratory distress. She has no wheezes.  Abdominal: Soft. Bowel sounds are normal. She exhibits no distension and no mass. There is no tenderness. There is no rebound and no guarding.  Musculoskeletal: She exhibits no edema or tenderness.  Lymphadenopathy:    She has no cervical adenopathy.  Neurological: She displays normal reflexes. No cranial nerve deficit. She exhibits normal muscle tone. Coordination normal.  Skin: No rash noted. No erythema.  Psychiatric: She has a normal mood and affect. Her behavior is normal. Judgment and thought content normal.    Lab Results  Component Value Date   WBC 10.5 02/20/2018   HGB 11.9 02/20/2018   HCT 36.2 02/20/2018   PLT 418 (H) 02/20/2018   GLUCOSE 178 (H) 02/11/2018   CHOL 220 (H) 04/27/2017   TRIG 60.0 04/27/2017   HDL 79.80 04/27/2017   LDLDIRECT 119.5 08/27/2012   LDLCALC 128 (H) 04/27/2017   ALT 33 02/11/2018   AST 24 02/11/2018   NA 130 (L) 02/11/2018   K 4.0 02/11/2018   CL 97 02/11/2018   CREATININE 1.28 (H) 02/11/2018   BUN 13 02/11/2018   CO2 23 02/11/2018   TSH 1.41 04/27/2017   HGBA1C 6.6 (H) 04/27/2017   MICROALBUR 37.9 (H) 04/27/2017    Dg Chest 2 View  Result Date: 02/15/2018 CLINICAL DATA:  Persistent cough and low-grade fever for 3 weeks, diabetes mellitus,  hypertension EXAM: CHEST - 2 VIEW COMPARISON:  02/04/2018 FINDINGS: Normal heart size, mediastinal contours, and pulmonary vascularity. Lungs clear. No pulmonary infiltrate, pleural effusion or pneumothorax. Multilevel endplate spur formation thoracic spine. IMPRESSION: No acute abnormalities. Electronically Signed   By: Lavonia Dana M.D.   On: 02/15/2018 16:19    Assessment & Plan:   There are no diagnoses linked to this encounter.   No orders of the defined types were placed in this encounter.    Follow-up: No follow-ups on file.  Walker Kehr, MD

## 2018-02-26 NOTE — Telephone Encounter (Signed)
Per lab patient's sodium is critical at 121.

## 2018-02-27 ENCOUNTER — Other Ambulatory Visit: Payer: Self-pay

## 2018-02-27 ENCOUNTER — Observation Stay (HOSPITAL_COMMUNITY)
Admission: EM | Admit: 2018-02-27 | Discharge: 2018-03-02 | Disposition: A | Discharge status: Home / Self Care | Payer: BLUE CROSS/BLUE SHIELD | Attending: Internal Medicine | Admitting: Internal Medicine

## 2018-02-27 ENCOUNTER — Encounter (HOSPITAL_COMMUNITY): Payer: Self-pay | Admitting: Emergency Medicine

## 2018-02-27 ENCOUNTER — Ambulatory Visit
Admission: RE | Admit: 2018-02-27 | Discharge: 2018-02-27 | Disposition: A | Discharge status: Home / Self Care | Payer: BLUE CROSS/BLUE SHIELD | Source: Ambulatory Visit | Attending: Internal Medicine | Admitting: Internal Medicine

## 2018-02-27 DIAGNOSIS — Z794 Long term (current) use of insulin: Secondary | ICD-10-CM | POA: Insufficient documentation

## 2018-02-27 DIAGNOSIS — E538 Deficiency of other specified B group vitamins: Secondary | ICD-10-CM | POA: Insufficient documentation

## 2018-02-27 DIAGNOSIS — I2699 Other pulmonary embolism without acute cor pulmonale: Secondary | ICD-10-CM | POA: Diagnosis present

## 2018-02-27 DIAGNOSIS — I5030 Unspecified diastolic (congestive) heart failure: Secondary | ICD-10-CM | POA: Insufficient documentation

## 2018-02-27 DIAGNOSIS — Z90710 Acquired absence of both cervix and uterus: Secondary | ICD-10-CM | POA: Insufficient documentation

## 2018-02-27 DIAGNOSIS — E119 Type 2 diabetes mellitus without complications: Secondary | ICD-10-CM | POA: Diagnosis not present

## 2018-02-27 DIAGNOSIS — I10 Essential (primary) hypertension: Secondary | ICD-10-CM | POA: Diagnosis present

## 2018-02-27 DIAGNOSIS — R0600 Dyspnea, unspecified: Secondary | ICD-10-CM | POA: Diagnosis not present

## 2018-02-27 DIAGNOSIS — R634 Abnormal weight loss: Secondary | ICD-10-CM

## 2018-02-27 DIAGNOSIS — Z888 Allergy status to other drugs, medicaments and biological substances status: Secondary | ICD-10-CM | POA: Diagnosis not present

## 2018-02-27 DIAGNOSIS — R0602 Shortness of breath: Secondary | ICD-10-CM

## 2018-02-27 DIAGNOSIS — Z86711 Personal history of pulmonary embolism: Secondary | ICD-10-CM | POA: Diagnosis not present

## 2018-02-27 DIAGNOSIS — E785 Hyperlipidemia, unspecified: Secondary | ICD-10-CM | POA: Insufficient documentation

## 2018-02-27 DIAGNOSIS — E871 Hypo-osmolality and hyponatremia: Secondary | ICD-10-CM | POA: Diagnosis not present

## 2018-02-27 DIAGNOSIS — D649 Anemia, unspecified: Secondary | ICD-10-CM | POA: Insufficient documentation

## 2018-02-27 DIAGNOSIS — I110 Hypertensive heart disease with heart failure: Secondary | ICD-10-CM | POA: Insufficient documentation

## 2018-02-27 DIAGNOSIS — Z881 Allergy status to other antibiotic agents status: Secondary | ICD-10-CM | POA: Diagnosis not present

## 2018-02-27 DIAGNOSIS — R05 Cough: Secondary | ICD-10-CM

## 2018-02-27 DIAGNOSIS — Z9889 Other specified postprocedural states: Secondary | ICD-10-CM | POA: Insufficient documentation

## 2018-02-27 DIAGNOSIS — Z8249 Family history of ischemic heart disease and other diseases of the circulatory system: Secondary | ICD-10-CM | POA: Insufficient documentation

## 2018-02-27 DIAGNOSIS — E669 Obesity, unspecified: Secondary | ICD-10-CM | POA: Diagnosis present

## 2018-02-27 DIAGNOSIS — G4733 Obstructive sleep apnea (adult) (pediatric): Secondary | ICD-10-CM | POA: Diagnosis not present

## 2018-02-27 DIAGNOSIS — Z79899 Other long term (current) drug therapy: Secondary | ICD-10-CM | POA: Diagnosis not present

## 2018-02-27 DIAGNOSIS — E1169 Type 2 diabetes mellitus with other specified complication: Secondary | ICD-10-CM | POA: Diagnosis present

## 2018-02-27 DIAGNOSIS — R059 Cough, unspecified: Secondary | ICD-10-CM

## 2018-02-27 DIAGNOSIS — R509 Fever, unspecified: Secondary | ICD-10-CM

## 2018-02-27 LAB — COMPREHENSIVE METABOLIC PANEL
ALBUMIN: 2.8 g/dL — AB (ref 3.5–5.0)
ALK PHOS: 57 U/L (ref 38–126)
ALT: 22 U/L (ref 0–44)
ANION GAP: 12 (ref 5–15)
AST: 29 U/L (ref 15–41)
BUN: 10 mg/dL (ref 8–23)
CALCIUM: 9 mg/dL (ref 8.9–10.3)
CO2: 26 mmol/L (ref 22–32)
Chloride: 82 mmol/L — ABNORMAL LOW (ref 98–111)
Creatinine, Ser: 1.25 mg/dL — ABNORMAL HIGH (ref 0.44–1.00)
GFR calc non Af Amer: 44 mL/min — ABNORMAL LOW (ref >60)
GFR, EST AFRICAN AMERICAN: 52 mL/min — AB (ref >60)
GLUCOSE: 150 mg/dL — AB (ref 70–99)
Potassium: 3.4 mmol/L — ABNORMAL LOW (ref 3.5–5.1)
SODIUM: 120 mmol/L — AB (ref 135–145)
Total Bilirubin: 0.6 mg/dL (ref 0.3–1.2)
Total Protein: 7.4 g/dL (ref 6.5–8.1)

## 2018-02-27 LAB — CBC WITH DIFFERENTIAL/PLATELET
Abs Immature Granulocytes: 0 10*3/uL (ref 0.0–0.1)
BASOS ABS: 0 10*3/uL (ref 0.0–0.1)
BASOS PCT: 1 %
EOS PCT: 3 %
Eosinophils Absolute: 0.2 10*3/uL (ref 0.0–0.7)
HCT: 33.5 % — ABNORMAL LOW (ref 36.0–46.0)
HEMOGLOBIN: 11.2 g/dL — AB (ref 12.0–15.0)
Immature Granulocytes: 1 %
LYMPHS PCT: 24 %
Lymphs Abs: 1.7 10*3/uL (ref 0.7–4.0)
MCH: 27 pg (ref 26.0–34.0)
MCHC: 33.4 g/dL (ref 30.0–36.0)
MCV: 80.7 fL (ref 78.0–100.0)
MONO ABS: 1.9 10*3/uL — AB (ref 0.1–1.0)
Monocytes Relative: 26 %
NEUTROS ABS: 3.3 10*3/uL (ref 1.7–7.7)
Neutrophils Relative %: 45 %
PLATELETS: 239 10*3/uL (ref 150–400)
RBC: 4.15 MIL/uL (ref 3.87–5.11)
RDW: 14.3 % (ref 11.5–15.5)
WBC: 7.2 10*3/uL (ref 4.0–10.5)

## 2018-02-27 MED ORDER — ONDANSETRON HCL 4 MG/2ML IJ SOLN
INTRAMUSCULAR | Status: AC
  Filled 2018-02-27: qty 2

## 2018-02-27 MED ORDER — HEPARIN BOLUS VIA INFUSION
6000.0000 [IU] | Freq: Once | INTRAVENOUS | Status: DC
  Filled 2018-02-27: qty 6000

## 2018-02-27 MED ORDER — ALBUTEROL SULFATE (2.5 MG/3ML) 0.083% IN NEBU: 5.0000 mg | INHALATION_SOLUTION | Freq: Once | RESPIRATORY_TRACT | Status: DC

## 2018-02-27 MED ORDER — HEPARIN BOLUS VIA INFUSION
5000.0000 [IU] | Freq: Once | INTRAVENOUS | Status: AC
  Administered 2018-02-28: 5000 [IU] via INTRAVENOUS
  Filled 2018-02-27: qty 5000

## 2018-02-27 MED ORDER — ONDANSETRON HCL 4 MG/2ML IJ SOLN
4.0000 mg | Freq: Once | INTRAMUSCULAR | Status: AC
  Administered 2018-02-27: 4 mg via INTRAVENOUS

## 2018-02-27 MED ORDER — GI COCKTAIL ~~LOC~~
30.0000 mL | Freq: Once | ORAL | Status: AC
  Administered 2018-02-27: 30 mL via ORAL

## 2018-02-27 MED ORDER — IOPAMIDOL (ISOVUE-370) INJECTION 76%
75.0000 mL | Freq: Once | INTRAVENOUS | Status: AC | PRN
  Administered 2018-02-27: 75 mL via INTRAVENOUS

## 2018-02-27 MED ORDER — HEPARIN (PORCINE) IN NACL 100-0.45 UNIT/ML-% IJ SOLN
1100.0000 [IU]/h | INTRAMUSCULAR | Status: DC
  Administered 2018-02-28: 1100 [IU]/h via INTRAVENOUS
  Administered 2018-02-28: 1300 [IU]/h via INTRAVENOUS
  Filled 2018-02-27 (×2): qty 250

## 2018-02-27 NOTE — ED Provider Notes (Signed)
Patient presented to the ER with low-grade fever, cough, shortness of breath.  She had an outpatient CT scan today which showed PE.  Face to face Exam: HEENT - PERRLA Lungs - CTAB Heart - RRR, no M/R/G Abd - S/NT/ND Neuro - alert, oriented x3  Plan: Patient appears well, not hypoxic.  She does, however, have a fairly large clot burden in her right lung.  No heart strain.  Initiate heparinization, admit to hospital.   Orpah Greek, MD 02/27/18 2350

## 2018-02-27 NOTE — ED Provider Notes (Signed)
[facility] EMERGENCY DEPARTMENT Provider Note   CSN: [id number] Arrival date & time: 02/27/18  2118     History   Chief Complaint Chief Complaint  Patient presents with  . Shortness of Breath    HPI Sierra Johnson is a 67 y.o. female.  The history is provided by the patient and medical records. No language interpreter was used.  Shortness of Breath      67 year old female with history of diabetes, hypertension, hyperlipidemia and obstructive sleep apnea on CPAP sent here for recommendation of PCP due to CT angiogram today show evidence of PE.  Patient report for the past 6 weeks she has has cough and intermittent fever.  She has been seen evaluate multiple times both at the ED and at PCP office.  She has been treated with azithromycin, Augmentin yet symptoms persist.  For the past 3 to 4 days she noticed worsening shortness of breath even with walking for short distance.  She endorsed cough productive with yellow sputum without blood.  She endorsed some vague chest discomfort while she is in the ED.  Described as a "heartburn sensation" to her midsternum, 4 out of 10.  She denies fever, lightheadedness, dizziness, hemoptysis, exertional chest pain, abdominal pain or back pain.  She denies any prior history of PE or DVT, no recent surgery, prolonged bedrest, active cancer, on oral hormone, leg swelling or calf pain.  She is not a smoker.  Past Medical History:  Diagnosis Date  . ANEMIA-NOS 04/24/2007  . DIABETES MELLITUS, TYPE II 04/24/2007  . Elevated glucose 2011  . HYPERLIPIDEMIA 07/17/2007   mild  . HYPERTENSION 04/24/2007  . Menopause 1998   Dr. Gaetano Net  . OBSTRUCTIVE SLEEP APNEA 09/30/2009   on CPAP 2011  . VITAMIN B12 DEFICIENCY 04/24/2007    Patient Active Problem List   Diagnosis Date Noted  . Cough 02/21/2018  . Fever and chills 01/30/2018  . Headache 01/30/2018  . Asthmatic bronchitis 01/18/2018  . Upper respiratory infection 04/27/2017  .  Insomnia 03/08/2016  . Eczema 04/13/2015  . Edema 02/11/2015  . S/P laparoscopic hernia repair 06/03/2014  . Knee osteoarthritis 04/30/2014  . Recurrent ventral incisional hernia 10/16/2012  . S/P repair of ventral hernia 08/28/2012  . Diabetes mellitus type 2 in obese ([place]) 05/07/2012  . Morbid obesity ([place]) 01/18/2012  . Well adult exam 04/17/2011  . Sinusitis, acute 04/17/2011  . SHOULDER PAIN 12/07/2009  . POLYURIA 12/07/2009  . Obstructive sleep apnea 09/30/2009  . FATIGUE 09/15/2009  . PARESTHESIA 06/08/2008  . Dyslipidemia 07/17/2007  . B12 deficiency 04/24/2007  . Anemia 04/24/2007  . Essential hypertension 04/24/2007    Past Surgical History:  Procedure Laterality Date  . ABDOMINAL HYSTERECTOMY    . HERNIA REPAIR  11/11   Incar. midline hernia  . INCISIONAL HERNIA REPAIR N/A 06/03/2014   Procedure: LAPAROSCOPIC REPAIR RECURRENT  INCISIONAL HERNIA;  Surgeon: Georganna Skeans, MD;  Location: [facility];  Service: General;  Laterality: N/A;  . INSERTION OF MESH N/A 06/03/2014   Procedure: INSERTION OF MESH;  Surgeon: Georganna Skeans, MD;  Location: [facility];  Service: General;  Laterality: N/A;     OB History   None      Home Medications    Prior to Admission medications   Medication Sig Start Date End Date Taking? Authorizing Provider  albuterol (PROVENTIL HFA;VENTOLIN HFA) 108 (90 Base) MCG/ACT inhaler Inhale 2 puffs into the lungs every 4 (four) hours as needed for wheezing or shortness of  breath. Dispense with aerochamber 12/10/15   Melynda Ripple, MD  benzonatate (TESSALON) 100 MG capsule Take 1 capsule (100 mg total) by mouth every 8 (eight) hours. 01/20/18   Deno Etienne, DO  Cholecalciferol (VITAMIN D3) 1000 UNITS CAPS Take 1,000 Units by mouth daily.     [provider]  fish oil-omega-3 fatty acids 1000 MG capsule Take 1 g by mouth 2 (two) times daily.     [provider]  FREESTYLE INSULINX TEST test strip 1 EACH BY OTHER ROUTE 2 (TWO) TIMES DAILY.  USE TO CHECK BLOOD SUGARS TWICE A DAY 01/30/18   Plotnikov, [name], MD  glucose monitoring kit (FREESTYLE) monitoring kit 1 each by Does not apply route 2 (two) times daily. Dx: E11.9, I10 10/15/15   Plotnikov, [name], MD  ibuprofen (ADVIL,MOTRIN) 600 MG tablet TAKE 1 TABLET (600 MG TOTAL) BY MOUTH 2 (TWO) TIMES DAILY AS NEEDED FOR MODERATE PAIN. 04/23/17   Plotnikov, [name], MD  Insulin Glargine (TOUJEO SOLOSTAR) 300 UNIT/ML SOPN Inject 18 Units into the skin every morning. 03/20/16   Plotnikov, [name], MD  Insulin Pen Needle 31G X 5 MM MISC 1 Units by Does not apply route 4 (four) times daily. 12/22/16   Plotnikov, [name], MD  ipratropium-albuterol (DUONEB) 0.5-2.5 (3) MG/3ML SOLN Take 3 mLs by nebulization every 6 (six) hours as needed. 02/20/18   Plotnikov, [name], MD  Lancets (FREESTYLE) lancets 1 each by Other route 2 (two) times daily. Dx: E11.9, I10 04/13/16   Plotnikov, [name], MD  levofloxacin (LEVAQUIN) 500 MG tablet Take 1 tablet (500 mg total) by mouth daily. 02/15/18   Marrian Salvage, FNP  metFORMIN (GLUCOPHAGE) 500 MG tablet TAKE 1 TABLET BY MOUTH 3 TIMES DAILY Patient taking differently: Take 500 mg by mouth 3 (three) times daily.  05/06/17   Plotnikov, [name], MD  predniSONE (DELTASONE) 20 MG tablet Take 1 tablet (20 mg total) by mouth daily with breakfast. 02/15/18   Marrian Salvage, FNP  promethazine-codeine [facility] WITH CODEINE) 6.25-10 MG/5ML syrup Take 5 mLs by mouth every 4 (four) hours as needed. 02/20/18   Plotnikov, [name], MD  Spacer/Aero-Holding Chambers (AEROCHAMBER PLUS) inhaler Use as instructed 12/10/15   Melynda Ripple, MD  triamterene-hydrochlorothiazide (MAXZIDE-25) 37.5-25 MG tablet Take 1 tablet by mouth daily. 02/08/18   Plotnikov, [name], MD  vitamin B-12 (CYANOCOBALAMIN) 1000 MCG tablet Take 1,000 mcg by mouth daily.      [provider]    Family History Family History  Problem Relation Age of Onset  . Heart  disease Mother 28       ?CAD  . Cancer Father        lung  . Heart disease Brother 89       CAD  . Hypertension Other     Social History Social History   Tobacco Use  . Smoking status: Never Smoker  . Smokeless tobacco: Never Used  Substance Use Topics  . Alcohol use: No  . Drug use: No     Allergies   Amlodipine; Benazepril; and Tetracycline   Review of Systems Review of Systems  Respiratory: Positive for shortness of breath.   All other systems reviewed and are negative.    Physical Exam Updated Vital Signs BP (!) 143/92 (BP Location: Right Arm)   Pulse 88   Temp 98.6 F (37 C) (Oral)   Resp 18   SpO2 96%   Physical Exam  Constitutional: She appears well-developed and  well-nourished. No distress.  Obese female nontoxic in appearance  HENT:  Head: Atraumatic.  Eyes: Conjunctivae are normal.  Neck: Neck supple.  Cardiovascular: Normal rate and regular rhythm.  Pulmonary/Chest: Effort normal and breath sounds normal. No accessory muscle usage. No respiratory distress. She has no decreased breath sounds. She has no wheezes. She has no rhonchi. She has no rales.  Abdominal: Soft. She exhibits no mass. There is no tenderness.  Musculoskeletal:       Right lower leg: She exhibits no edema.       Left lower leg: She exhibits no edema.  Neurological: She is alert.  Skin: No rash noted.  Psychiatric: She has a normal mood and affect.  Nursing note and vitals reviewed.    ED Treatments / Results  Labs (all labs ordered are listed, but only abnormal results are displayed) Labs Reviewed  CBC WITH DIFFERENTIAL/PLATELET - Abnormal; Notable for the following components:      Result Value   Hemoglobin 11.2 (*)    HCT 33.5 (*)    Monocytes Absolute 1.9 (*)    All other components within normal limits  COMPREHENSIVE METABOLIC PANEL - Abnormal; Notable for the following components:   Sodium 120 (*)    Potassium 3.4 (*)    Chloride 82 (*)    Glucose, Bld 150  (*)    Creatinine, Ser 1.25 (*)    Albumin 2.8 (*)    GFR calc non Af Amer 44 (*)    GFR calc Af Amer 52 (*)    All other components within normal limits  HEPARIN LEVEL (UNFRACTIONATED)  CBC  TROPONIN I    EKG None   Date: 02/27/2018  Rate: 87  Rhythm: normal sinus rhythm  QRS Axis: normal  Intervals: normal  ST/T Wave abnormalities: normal  Conduction Disutrbances: none  Narrative Interpretation:   Old EKG Reviewed: No significant changes noted     Radiology Ct Angio Chest Pe W Or Wo Contrast  Result Date: 02/27/2018 CLINICAL DATA:  Cough , fever chills and headaches x 4 -6 weeks General abd pai hysterectomy with single oophorectomy Hernia repair with mesh Prev cxr EXAM: CT ANGIOGRAPHY CHEST WITH CONTRAST TECHNIQUE: Multidetector CT imaging of the chest was performed using the standard protocol during bolus administration of intravenous contrast. Multiplanar CT image reconstructions and MIPs were obtained to evaluate the vascular anatomy. CONTRAST:  2m ISOVUE-370 IOPAMIDOL (ISOVUE-370) INJECTION 76% COMPARISON:  None. FINDINGS: Cardiovascular: Heart size normal. No pericardial effusion. Embolus in right upper lobe branch of the pulmonary artery extending into the segmental branches. No left-sided emboli. Fair contrast opacification of the thoracic aorta, with no evidence of dissection, aneurysm, or stenosis. There is classic 3-vessel brachiocephalic arch anatomy without proximal stenosis. No significant atheromatous irregularity. Visualized proximal abdominal aorta unremarkable. Mediastinum/Nodes: No hilar or mediastinal adenopathy. Lungs/Pleura: No pleural effusion. No pneumothorax. Platelike atelectasis laterally in the right lower lobe. Consolidation/atelectasis in posterior and medial basal segments left lower lobe. Upper Abdomen: No acute findings. Musculoskeletal: Anterior vertebral endplate spurring at multiple levels in the mid and lower thoracic spine. No fracture or  worrisome bone lesion. Review of the MIP images confirms the above findings. IMPRESSION: 1. Pulmonary embolus in right upper lobe branch of the pulmonary artery extending into the segmental branches. These results will be called to the ordering clinician or representative by the Radiologist Assistant, and communication documented in the PACS or zVision Dashboard. 2. Bibasilar airspace disease favor atelectasis over consolidation, left worse than right. Electronically Signed  By: Lucrezia Europe M.D.   On: 02/27/2018 16:53   Ct Abdomen Pelvis W Contrast  Result Date: 02/27/2018 CLINICAL DATA:  Cough , fever chills and headaches x 4 -6 weeks General abd pai hysterectomy with single oophorectomy Hernia repair with mesh Prev cxr EXAM: CT ABDOMEN AND PELVIS WITH CONTRAST TECHNIQUE: Multidetector CT imaging of the abdomen and pelvis was performed using the standard protocol following bolus administration of intravenous contrast. CONTRAST:  17m ISOVUE-370 IOPAMIDOL (ISOVUE-370) INJECTION 76% COMPARISON:  09/10/2012 FINDINGS: Lower chest: Bibasilar consolidation/atelectasis left greater than right. No pleural or pericardial effusion. Hepatobiliary: No focal liver abnormality is seen. No gallstones, gallbladder wall thickening, or biliary dilatation. Pancreas: Unremarkable. No pancreatic ductal dilatation or surrounding inflammatory changes. Spleen: Normal in size without focal abnormality. Adrenals/Urinary Tract: Normal adrenals. Normal right kidney. 4.1 cm mid left renal cyst. No hydronephrosis. Urinary bladder is nondistended. Stomach/Bowel: Stomach is incompletely distended with ingested material. Small bowel nondilated. Normal appendix. Moderate proximal colonic fecal material without dilatation. Distal colon decompressed, unremarkable. Vascular/Lymphatic: Mild scattered aortoiliac atheromatous calcifications without aneurysm or stenosis. No abdominal or pelvic adenopathy. Portal vein patent. Reproductive: Status post  hysterectomy. No adnexal masses. Other: No ascites.  No free air. Musculoskeletal: Interval ventral hernia mesh repair. Spondylitic changes in the lower lumbar spine. No fracture or worrisome bone lesion. IMPRESSION: 1.  Bilateral lower lobe airspace disease,Left greater than right. 2. No acute abdominal process. 3. Postop and degenerative changes as above. Electronically Signed   By: DLucrezia EuropeM.D.   On: 02/27/2018 16:58    Procedures .Critical Care Performed by: TDomenic Moras PA-C Authorized by: TDomenic Moras PA-C   Critical care provider statement:    Critical care time (minutes):  30   Critical care was time spent personally by me on the following activities:  Discussions with consultants, evaluation of patient's response to treatment, examination of patient, ordering and performing treatments and interventions, ordering and review of laboratory studies, ordering and review of radiographic studies, pulse oximetry, re-evaluation of patient's condition, obtaining history from patient or surrogate and review of old charts   (including critical care time)  Medications Ordered in ED Medications  albuterol (PROVENTIL) (2.5 MG/3ML) 0.083% nebulizer solution 5 mg (5 mg Nebulization Not Given 02/27/18 2231)     Initial Impression / Assessment and Plan / ED Course  I have reviewed the triage vital signs and the nursing notes.  Pertinent labs & imaging results that were available during my care of the patient were reviewed by me and considered in my medical decision making (see chart for details).     BP (!) 143/92 (BP Location: Right Arm)   Pulse 88   Temp 98.6 F (37 C) (Oral)   Resp 18   Ht 5' (1.524 m)   Wt 100.2 kg   SpO2 96%   BMI 43.14 kg/m    Final Clinical Impressions(s) / ED Diagnoses   Final diagnoses:  Pulmonary embolus, right ([facility]    ED Discharge Orders    None     10:53 PM Patient complaining of low-grade fever, productive cough and now increased shortness of  breath.  Symptom ongoing for 6 weeks.  PCP obtain chest CT angiogram today that shows PE in the right upper branch of the pulmonary artery extending to the segmental branches.  No report of right heart strain.  Aside from age, she does not have any significant risk factor for PE.  Her vital signs are stable.  She is however complaining of chest  discomfort that started while she is in the ER.  Will check labs, including EKG and troponin.  Initiate heparin.  11:58 PM Care discussed with Dr. Betsey Holiday.  Appreciate consultation from [facility] Dr. Hal Hope who agrees to see and admit pt for further management of pt's PE.  Pt is currently on heparin.     Domenic Moras, PA-C 02/27/18 2359    [name] Ferguson, MD 03/01/18 [phone]

## 2018-02-27 NOTE — ED Triage Notes (Signed)
Pt reports SHOB 3-4 days. Pt reports having CT scan, PCP told pt she has a PE. Pt denies CP.

## 2018-02-27 NOTE — Telephone Encounter (Signed)
Letter written, LM notifying pt that she can come by and pick up

## 2018-02-27 NOTE — Telephone Encounter (Signed)
I called the patient.  She was informed of her low sodium.  I informed the patient of her CT scan results (right-sided pulmonary embolism) and instructed her to go to most go to ER immediately.  The patient agreed.  I informed [facility] ER triage nurse Joellen Jersey of her arrival.

## 2018-02-27 NOTE — Progress Notes (Signed)
ANTICOAGULATION CONSULT NOTE - Initial Consult  Pharmacy Consult for heparin Indication: pulmonary embolus  Allergies  Allergen Reactions  . Amlodipine     LE swelling on 10 mg  . Benazepril Swelling    Lip swelling  . Tetracycline Nausea And Vomiting    Patient Measurements: Height: 5' (152.4 cm) Weight: 220 lb 14.4 oz (100.2 kg) IBW/kg (Calculated) : 45.5 Heparin Dosing Weight: 70kg  Vital Signs: Temp: 98.6 F (37 C) (09/11 2125) Temp Source: Oral (09/11 2125) BP: 143/92 (09/11 2125) Pulse Rate: 88 (09/11 2125)  Labs: Recent Labs    02/26/18 1134  CREATININE 1.16    Estimated Creatinine Clearance: 52.1 mL/min (by C-G formula based on SCr of 1.16 mg/dL).   Medical History: Past Medical History:  Diagnosis Date  . ANEMIA-NOS 04/24/2007  . DIABETES MELLITUS, TYPE II 04/24/2007  . Elevated glucose 2011  . HYPERLIPIDEMIA 07/17/2007   mild  . HYPERTENSION 04/24/2007  . Menopause 1998   Dr. Gaetano Net  . OBSTRUCTIVE SLEEP APNEA 09/30/2009   on CPAP 2011  . VITAMIN B12 DEFICIENCY 04/24/2007    Assessment: 67yo female presented to PCP w/ SOB x3-4d, had CT which revealed PE, sent to ED, now to start heparin.  Goal of Therapy:  Heparin level 0.3-0.7 units/ml Monitor platelets by anticoagulation protocol: Yes   Plan:  Will give heparin bolus of 5000 units followed by gtt at 1300 units/hr and monitor heparin levels and CBC.  Wynona Neat, PharmD, BCPS  02/27/2018,11:07 PM

## 2018-02-27 NOTE — ED Notes (Addendum)
Pt has been having a productive cough for the past month with generalized weakness and fever. Pt seen PCP and had a CT angio with labwork today (results listed in chart review); CTA showing "Pulmonary embolus in right upper lobe branch of the pulmonary artery extending into the segmental branches." Denies CP at present.

## 2018-02-27 NOTE — ED Notes (Signed)
Pt vomiting and diaphoretic. Orders obtained for heartburn and nausea.

## 2018-02-28 ENCOUNTER — Ambulatory Visit: Payer: BLUE CROSS/BLUE SHIELD | Admitting: Primary Care

## 2018-02-28 ENCOUNTER — Other Ambulatory Visit: Payer: Self-pay

## 2018-02-28 ENCOUNTER — Encounter (HOSPITAL_COMMUNITY): Payer: Self-pay | Admitting: Internal Medicine

## 2018-02-28 DIAGNOSIS — Z86711 Personal history of pulmonary embolism: Secondary | ICD-10-CM | POA: Diagnosis present

## 2018-02-28 DIAGNOSIS — I2699 Other pulmonary embolism without acute cor pulmonale: Secondary | ICD-10-CM | POA: Diagnosis not present

## 2018-02-28 DIAGNOSIS — E871 Hypo-osmolality and hyponatremia: Secondary | ICD-10-CM | POA: Diagnosis present

## 2018-02-28 LAB — BASIC METABOLIC PANEL
ANION GAP: 11 (ref 5–15)
ANION GAP: 11 (ref 5–15)
BUN: 8 mg/dL (ref 8–23)
BUN: 7 mg/dL — AB (ref 8–23)
CHLORIDE: 87 mmol/L — AB (ref 98–111)
CHLORIDE: 86 mmol/L — AB (ref 98–111)
CO2: 26 mmol/L (ref 22–32)
CO2: 28 mmol/L (ref 22–32)
Calcium: 8.9 mg/dL (ref 8.9–10.3)
Calcium: 8.8 mg/dL — ABNORMAL LOW (ref 8.9–10.3)
Creatinine, Ser: 1.15 mg/dL — ABNORMAL HIGH (ref 0.44–1.00)
Creatinine, Ser: 1.14 mg/dL — ABNORMAL HIGH (ref 0.44–1.00)
GFR, EST AFRICAN AMERICAN: 57 mL/min — AB (ref >60)
GFR, EST AFRICAN AMERICAN: 58 mL/min — AB (ref >60)
GFR, EST NON AFRICAN AMERICAN: 49 mL/min — AB (ref >60)
GFR, EST NON AFRICAN AMERICAN: 50 mL/min — AB (ref >60)
Glucose, Bld: 106 mg/dL — ABNORMAL HIGH (ref 70–99)
Glucose, Bld: 98 mg/dL (ref 70–99)
POTASSIUM: 3.8 mmol/L (ref 3.5–5.1)
Potassium: 3.7 mmol/L (ref 3.5–5.1)
Sodium: 124 mmol/L — ABNORMAL LOW (ref 135–145)
Sodium: 125 mmol/L — ABNORMAL LOW (ref 135–145)

## 2018-02-28 LAB — SODIUM, URINE, RANDOM: SODIUM UR: 14 mmol/L

## 2018-02-28 LAB — URINALYSIS, ROUTINE W REFLEX MICROSCOPIC
BILIRUBIN URINE: NEGATIVE
GLUCOSE, UA: NEGATIVE mg/dL
HGB URINE DIPSTICK: NEGATIVE
Ketones, ur: NEGATIVE mg/dL
Leukocytes, UA: NEGATIVE
Nitrite: NEGATIVE
PH: 7 (ref 5.0–8.0)
PROTEIN: NEGATIVE mg/dL
Specific Gravity, Urine: 1.005 (ref 1.005–1.030)

## 2018-02-28 LAB — GLUCOSE, CAPILLARY
GLUCOSE-CAPILLARY: 117 mg/dL — AB (ref 70–99)
GLUCOSE-CAPILLARY: 139 mg/dL — AB (ref 70–99)
Glucose-Capillary: 167 mg/dL — ABNORMAL HIGH (ref 70–99)
Glucose-Capillary: 107 mg/dL — ABNORMAL HIGH (ref 70–99)
Glucose-Capillary: 181 mg/dL — ABNORMAL HIGH (ref 70–99)

## 2018-02-28 LAB — CBC
HCT: 32.5 % — ABNORMAL LOW (ref 36.0–46.0)
HEMOGLOBIN: 10.7 g/dL — AB (ref 12.0–15.0)
MCH: 26.6 pg (ref 26.0–34.0)
MCHC: 32.9 g/dL (ref 30.0–36.0)
MCV: 80.8 fL (ref 78.0–100.0)
PLATELETS: 266 10*3/uL (ref 150–400)
RBC: 4.02 MIL/uL (ref 3.87–5.11)
RDW: 14.2 % (ref 11.5–15.5)
WBC: 7 10*3/uL (ref 4.0–10.5)

## 2018-02-28 LAB — TROPONIN I
Troponin I: <0.03 ng/mL (ref <0.03)
Troponin I: <0.03 ng/mL (ref <0.03)

## 2018-02-28 LAB — PROCALCITONIN: Procalcitonin: <0.1 ng/mL

## 2018-02-28 LAB — OSMOLALITY, URINE: Osmolality, Ur: 119 mOsm/kg — ABNORMAL LOW (ref 300–900)

## 2018-02-28 LAB — HEPARIN LEVEL (UNFRACTIONATED)
HEPARIN UNFRACTIONATED: 1.05 [IU]/mL — AB (ref 0.30–0.70)
HEPARIN UNFRACTIONATED: 0.62 [IU]/mL (ref 0.30–0.70)

## 2018-02-28 LAB — HIV ANTIBODY (ROUTINE TESTING W REFLEX): HIV Screen 4th Generation wRfx: NONREACTIVE

## 2018-02-28 LAB — CORTISOL: CORTISOL PLASMA: 4.5 ug/dL

## 2018-02-28 LAB — OSMOLALITY: Osmolality: 261 mOsm/kg — ABNORMAL LOW (ref 275–295)

## 2018-02-28 MED ORDER — ACETAMINOPHEN 325 MG PO TABS
650.0000 mg | ORAL_TABLET | Freq: Four times a day (QID) | ORAL | Status: DC | PRN
  Administered 2018-02-28 – 2018-03-02 (×6): 650 mg via ORAL
  Filled 2018-02-28 (×6): qty 2

## 2018-02-28 MED ORDER — GUAIFENESIN 100 MG/5ML PO SOLN
5.0000 mL | ORAL | Status: DC | PRN
  Administered 2018-02-28: 100 mg via ORAL
  Filled 2018-02-28: qty 5

## 2018-02-28 MED ORDER — ACETAMINOPHEN 650 MG RE SUPP: 650.0000 mg | Freq: Four times a day (QID) | RECTAL | Status: DC | PRN

## 2018-02-28 MED ORDER — SODIUM CHLORIDE 0.9 % IV SOLN: INTRAVENOUS | Status: AC

## 2018-02-28 MED ORDER — SODIUM CHLORIDE 0.9 % IV SOLN: INTRAVENOUS | Status: DC

## 2018-02-28 MED ORDER — INSULIN GLARGINE 100 UNIT/ML ~~LOC~~ SOLN
18.0000 [IU] | Freq: Every day | SUBCUTANEOUS | Status: DC
  Filled 2018-02-28 (×3): qty 0.18

## 2018-02-28 MED ORDER — ONDANSETRON HCL 4 MG PO TABS: 4.0000 mg | ORAL_TABLET | Freq: Four times a day (QID) | ORAL | Status: DC | PRN

## 2018-02-28 MED ORDER — POLYETHYLENE GLYCOL 3350 17 G PO PACK
17.0000 g | PACK | Freq: Two times a day (BID) | ORAL | Status: DC
  Administered 2018-02-28 – 2018-03-02 (×5): 17 g via ORAL
  Filled 2018-02-28 (×5): qty 1

## 2018-02-28 MED ORDER — PROMETHAZINE-CODEINE 6.25-10 MG/5ML PO SYRP
5.0000 mL | ORAL_SOLUTION | Freq: Four times a day (QID) | ORAL | Status: DC | PRN
  Administered 2018-02-28 – 2018-03-02 (×5): 5 mL via ORAL
  Filled 2018-02-28 (×5): qty 5

## 2018-02-28 MED ORDER — OMEGA-3-ACID ETHYL ESTERS 1 G PO CAPS
1.0000 g | ORAL_CAPSULE | Freq: Two times a day (BID) | ORAL | Status: DC
  Administered 2018-02-28 – 2018-03-02 (×5): 1 g via ORAL
  Filled 2018-02-28 (×5): qty 1

## 2018-02-28 MED ORDER — SODIUM CHLORIDE 0.9 % IV SOLN
INTRAVENOUS | Status: DC
  Administered 2018-02-28 (×2): via INTRAVENOUS

## 2018-02-28 MED ORDER — MAGNESIUM OXIDE 400 (241.3 MG) MG PO TABS
200.0000 mg | ORAL_TABLET | Freq: Two times a day (BID) | ORAL | Status: DC
  Administered 2018-02-28 – 2018-03-02 (×5): 200 mg via ORAL
  Filled 2018-02-28 (×5): qty 1

## 2018-02-28 MED ORDER — ONDANSETRON HCL 4 MG/2ML IJ SOLN: 4.0000 mg | Freq: Four times a day (QID) | INTRAMUSCULAR | Status: DC | PRN

## 2018-02-28 MED ORDER — INSULIN ASPART 100 UNIT/ML ~~LOC~~ SOLN
0.0000 [IU] | Freq: Three times a day (TID) | SUBCUTANEOUS | Status: DC
  Administered 2018-03-01: 2 [IU] via SUBCUTANEOUS

## 2018-02-28 MED ORDER — DOCUSATE SODIUM 100 MG PO CAPS
200.0000 mg | ORAL_CAPSULE | Freq: Two times a day (BID) | ORAL | Status: DC
  Administered 2018-02-28 – 2018-03-02 (×5): 200 mg via ORAL
  Filled 2018-02-28 (×5): qty 2

## 2018-02-28 MED ORDER — VITAMIN D 1000 UNITS PO TABS
1000.0000 [IU] | ORAL_TABLET | Freq: Every day | ORAL | Status: DC
  Administered 2018-02-28 – 2018-03-02 (×3): 1000 [IU] via ORAL
  Filled 2018-02-28 (×3): qty 1

## 2018-02-28 MED ORDER — VITAMIN B-12 1000 MCG PO TABS
1000.0000 ug | ORAL_TABLET | Freq: Every day | ORAL | Status: DC
  Administered 2018-02-28 – 2018-03-02 (×3): 1000 ug via ORAL
  Filled 2018-02-28 (×3): qty 1

## 2018-02-28 NOTE — Progress Notes (Signed)
ANTICOAGULATION CONSULT NOTE - Follow Up Consult  Pharmacy Consult for Heparin Indication: pulmonary embolus  Allergies  Allergen Reactions  . Amlodipine Swelling and Other (See Comments)    LE swelling on 10 mg  . Benazepril Swelling and Other (See Comments)    Lip swelling  . Tetracycline Nausea And Vomiting    Patient Measurements: Height: 5' (152.4 cm) Weight: 223 lb 3.2 oz (101.2 kg) IBW/kg (Calculated) : 45.5 Heparin Dosing Weight: 70 kg  Vital Signs: Temp: 98.4 F (36.9 C) (09/12 0821) Temp Source: Oral (09/12 0821) BP: 139/88 (09/12 0821) Pulse Rate: 93 (09/12 0821)  Labs: Recent Labs    02/26/18 1134 02/27/18 2245 02/27/18 2301 02/28/18 0655 02/28/18 0756  HGB  --  11.2*  --  10.7*  --   HCT  --  33.5*  --  32.5*  --   PLT  --  239  --  266  --   HEPARINUNFRC  --   --   --   --  1.05*  CREATININE 1.16 1.25*  --  1.15*  --   TROPONINI  --   --  <0.03 <0.03  --     Estimated Creatinine Clearance: 52.9 mL/min (A) (by C-G formula based on SCr of 1.15 mg/dL (H)).  Assessment:  67 yr old female on IV heparin for pulmonary embolus per CTA.  Initial heparin level is supratherapeutic (1.05) on 1300 units/hr.  Sample drawn from opposite arm from IV site, no bleeding reported.    Goal of Therapy:  Heparin level 0.3-0.7 units/ml Monitor platelets by anticoagulation protocol: Yes   Plan:   Decrease heparin drip to 1100 units/hr  Heparin level ~6 hrs after rate change.  Daily heparin level and CBC.  Arty Baumgartner, [place] Pager: [phone] or phone: [phone] 02/28/2018,10:57 AM

## 2018-02-28 NOTE — Care Management (Signed)
02-28-18  BENEFITS CHECK :  #  4.   S/W  JOEL @  [facility]  RX # [phone]  1. XARELTO  15 MG BID COVER- YES CO-PAY- $ 181.37 TIER- PREFERRED BRAND PRIOR APPROVAL- NO  2. ELIQUIS   5 MG BID COVER- YES CO-PAY- $ 90.01 TIER- PREFERRED BRAND PRIOR APPROVAL- NO  NO DEDUCTIBLE  PREFERRED PHARMACY : YES [facility] AND [facility]

## 2018-02-28 NOTE — Progress Notes (Signed)
RT setup CPAP at patient bedside.  Patient unable to get comfortable.  Family member at bedside left to go get patients home CPAP.

## 2018-02-28 NOTE — Progress Notes (Signed)
@IPLOG @        PROGRESS NOTE                                                                                                                                                                                                             Patient Demographics:    Puja Caffey, is a 67 y.o. female, DOB - Feb 19, 1951, IRJ:[id number]  Admit date - 02/27/2018   Admitting Physician Rise Patience, MD  Outpatient Primary MD for the patient is Plotnikov, [name], MD  LOS - 0  Chief Complaint  Patient presents with  . Shortness of Breath       Brief Narrative  MICHAELE AMUNDSON is a 67 y.o. female with history of diabetes mellitus type 2, hypertension who has been experiencing fever and productive cough for last 1 month and had undergone 2 courses of antibiotics last one was last week last Friday about a week ago and also had one course of prednisone has been expressing exertional shortness of breath for last 4 days.  Denies any chest pain.  Had gone to her PCP ordered CT angiogram of the chest which showed right upper lobe pulmonary embolism and was admitted for further treatment.   Subjective:    Park Meo today has, No headache, No chest pain, No abdominal pain - No Nausea, No new weakness tingling or numbness, No Cough - SOB.    Assessment  & Plan :     1.  Acute right upper lobe PE.  Hemodynamically stable, minimally short of breath, on heparin drip which will be continued for 24 hours thereafter can be switched to Eliquis tomorrow, troponin negative x2 sets, pain-free.  No apparent inciting factors.  May require outpatient hematology follow-up post discharge.  2.  Hyponatremia.  Likely HCTZ induced, urine sodium around 14, gently hydrate and trend.  No signs of volume overload.  3.  Active fever and chills for a month.  No signs of infection, UA unremarkable, currently stable, question due to PE.  Monitor blood cultures.  Procalcitonin levels were undetectable.  Mild atelectasis  noted on CT.  Will add incentive spirometry.  4. Hypertension.  For now hold HCTZ due to hyponatremia, PRN hydralazine only for now.    5. DM type II.  Placed on Lantus and sliding scale monitor CBGs.  CBG (last 3)  Recent Labs    02/28/18 0157 02/28/18 0808 02/28/18 1127  GLUCAP 167* 107* 117*     Family Communication  :  None  Code Status :  Full  Disposition Plan  :  SDU  Consults  :    Procedures  :   CT -chest abdomen and pelvis. 1. Pulmonary embolus in right upper lobe branch of the pulmonary artery extending into the segmental branches.  Abdominal series nonacute.  DVT Prophylaxis  :    Heparin gtt  Lab Results  Component Value Date   PLT 266 02/28/2018    Diet :  Diet Order            Diet heart healthy/carb modified Room service appropriate? Yes; Fluid consistency: Thin  Diet effective now               Inpatient Medications Scheduled Meds: . cholecalciferol  1,000 Units Oral Daily  . docusate sodium  200 mg Oral BID  . insulin aspart  0-9 Units Subcutaneous TID WC  . insulin glargine  18 Units Subcutaneous Daily  . magnesium oxide  200 mg Oral BID  . omega-3 acid ethyl esters  1 g Oral BID  . polyethylene glycol  17 g Oral BID  . vitamin B-12  1,000 mcg Oral Daily   Continuous Infusions: . sodium chloride 75 mL/hr at 02/28/18 1229  . heparin 1,100 Units/hr (02/28/18 1209)   PRN Meds:.acetaminophen **OR** acetaminophen, guaiFENesin, ondansetron **OR** ondansetron (ZOFRAN) IV, promethazine-codeine  Antibiotics  :   Anti-infectives (From admission, onward)   None          Objective:   Vitals:   02/28/18 0131 02/28/18 0625 02/28/18 0821 02/28/18 1200  BP: (!) 153/90 139/83 139/88 122/74  Pulse: 87 90 93 86  Resp: 19 20 (!) 22 18  Temp: 98.7 F (37.1 C) 98.8 F (37.1 C) 98.4 F (36.9 C) 98.4 F (36.9 C)  TempSrc: Oral Oral Oral Oral  SpO2: 93% 92% 94% 94%  Weight:  101.2 kg    Height:        Wt Readings from Last 3  Encounters:  02/28/18 101.2 kg  02/26/18 100.2 kg  02/23/18 98.4 kg     Intake/Output Summary (Last 24 hours) at 02/28/2018 1337 Last data filed at 02/28/2018 1229 Gross per 24 hour  Intake 1020.35 ml  Output 100 ml  Net 920.35 ml     Physical Exam  Awake Alert, Oriented X 3, No new F.N deficits, Normal affect [place].AT,PERRAL Supple Neck,No JVD, No cervical lymphadenopathy appriciated.  Symmetrical Chest wall movement, Good air movement bilaterally, CTAB RRR,No Gallops,Rubs or new Murmurs, No Parasternal Heave +ve B.Sounds, Abd Soft, No tenderness, No organomegaly appriciated, No rebound - guarding or rigidity. No Cyanosis, Clubbing or edema, No new Rash or bruise       Data Review:    CBC Recent Labs  Lab 02/27/18 2245 02/28/18 0655  WBC 7.2 7.0  HGB 11.2* 10.7*  HCT 33.5* 32.5*  PLT 239 266  MCV 80.7 80.8  MCH 27.0 26.6  MCHC 33.4 32.9  RDW 14.3 14.2  LYMPHSABS 1.7  --   MONOABS 1.9*  --   EOSABS 0.2  --   BASOSABS 0.0  --     Chemistries  Recent Labs  Lab 02/26/18 1134 02/27/18 2245 02/28/18 0655  NA 121* 120* 124*  K 3.3* 3.4* 3.7  CL 87* 82* 87*  CO2 25 26 26   GLUCOSE 160* 150* 106*  BUN 15 10 8   CREATININE 1.16 1.25* 1.15*  CALCIUM 9.5 9.0 8.9  AST  --  29  --   ALT  --  22  --   ALKPHOS  --  57  --   BILITOT  --  0.6  --    ------------------------------------------------------------------------------------------------------------------ No results for input(s): CHOL, HDL, LDLCALC, TRIG, CHOLHDL, LDLDIRECT in the last 72 hours.  Lab Results  Component Value Date   HGBA1C 6.6 (H) 04/27/2017   ------------------------------------------------------------------------------------------------------------------ No results for input(s): TSH, T4TOTAL, T3FREE, THYROIDAB in the last 72 hours.  Invalid input(s): FREET3 ------------------------------------------------------------------------------------------------------------------ No results for  input(s): VITAMINB12, FOLATE, FERRITIN, TIBC, IRON, RETICCTPCT in the last 72 hours.  Coagulation profile No results for input(s): INR, PROTIME in the last 168 hours.  Recent Labs    02/26/18 1134  DDIMER 1.39*    Cardiac Enzymes Recent Labs  Lab 02/27/18 2301 02/28/18 0655  TROPONINI <0.03 <0.03   ------------------------------------------------------------------------------------------------------------------ No results found for: BNP  Micro Results Recent Results (from the past 240 hour(s))  MYCOBACTERIA, CULTURE, WITH FLUOROCHROME SMEAR     Status: None (Preliminary result)   Collection Time: 02/21/18 10:17 AM  Result Value Ref Range Status   MICRO NUMBER: [id number]  Preliminary   SPECIMEN QUALITY: ADEQUATE  Preliminary   Source: SPUTUM  Preliminary   STATUS: PRELIMINARY  Preliminary   SMEAR: No acid fast bacilli seen.  Preliminary   RESULT:   Preliminary    Culture results to follow. Final reports of negative cultures can be expected in approximately six weeks. Positive cultures are reported immediately.  Respiratory or Resp and Sputum Culture     Status: Abnormal   Collection Time: 02/21/18 10:17 AM  Result Value Ref Range Status   MICRO NUMBER: [id number]  Final   SPECIMEN QUALITY: ADEQUATE  Final   Source SPUTUM  Final   STATUS: FINAL  Final   GRAM STAIN: (A)  Final    No white blood cells seen Few Gram positive cocci in pairs epithelial cells   RESULT: Growth of normal oropharyngeal flora.  Final  Blood culture (routine single)     Status: None (Preliminary result)   Collection Time: 02/26/18 11:34 AM  Result Value Ref Range Status   MICRO NUMBER: [id number]  Preliminary   SPECIMEN QUALITY: ADEQUATE  Preliminary   Source NOT GIVEN  Preliminary   STATUS: PRELIMINARY  Preliminary   Result:   Preliminary    No growth to date. Culture is examined daily for a total of 7 days incubation. A change in status will result in a phone report followed by an updated printed  culture report.   COMMENT:   Preliminary    We received non-standard blood culture bottles with no test indicated. Based upon the specimen submitted, a blood culture test was performed. If this is not what you intended to order, please contact your local client service representative immediately  so that we can adjust our billing appropriately. You may also inquire about alternative or additional testing. Aerobic and anaerobic bottle received.     Radiology Reports Dg Chest 2 View  Result Date: 02/15/2018 CLINICAL DATA:  Persistent cough and low-grade fever for 3 weeks, diabetes mellitus, hypertension EXAM: CHEST - 2 VIEW COMPARISON:  02/04/2018 FINDINGS: Normal heart size, mediastinal contours, and pulmonary vascularity. Lungs clear. No pulmonary infiltrate, pleural effusion or pneumothorax. Multilevel endplate spur formation thoracic spine. IMPRESSION: No acute abnormalities. Electronically Signed   By: Lavonia Dana M.D.   On: 02/15/2018 16:19   Dg Chest 2 View  Result Date: 02/04/2018 CLINICAL DATA:  Fever EXAM: CHEST - 2 VIEW COMPARISON:  01/20/2018 chest radiograph. FINDINGS: Stable cardiomediastinal silhouette with normal heart size. No pneumothorax. No pleural effusion. Lungs  appear clear, with no acute consolidative airspace disease and no pulmonary edema. IMPRESSION: No active cardiopulmonary disease. Electronically Signed   By: Ilona Sorrel M.D.   On: 02/04/2018 13:17   Ct Angio Chest Pe W Or Wo Contrast  Result Date: 02/27/2018 CLINICAL DATA:  Cough , fever chills and headaches x 4 -6 weeks General abd pai hysterectomy with single oophorectomy Hernia repair with mesh Prev cxr EXAM: CT ANGIOGRAPHY CHEST WITH CONTRAST TECHNIQUE: Multidetector CT imaging of the chest was performed using the standard protocol during bolus administration of intravenous contrast. Multiplanar CT image reconstructions and MIPs were obtained to evaluate the vascular anatomy. CONTRAST:  35mL ISOVUE-370 IOPAMIDOL  (ISOVUE-370) INJECTION 76% COMPARISON:  None. FINDINGS: Cardiovascular: Heart size normal. No pericardial effusion. Embolus in right upper lobe branch of the pulmonary artery extending into the segmental branches. No left-sided emboli. Fair contrast opacification of the thoracic aorta, with no evidence of dissection, aneurysm, or stenosis. There is classic 3-vessel brachiocephalic arch anatomy without proximal stenosis. No significant atheromatous irregularity. Visualized proximal abdominal aorta unremarkable. Mediastinum/Nodes: No hilar or mediastinal adenopathy. Lungs/Pleura: No pleural effusion. No pneumothorax. Platelike atelectasis laterally in the right lower lobe. Consolidation/atelectasis in posterior and medial basal segments left lower lobe. Upper Abdomen: No acute findings. Musculoskeletal: Anterior vertebral endplate spurring at multiple levels in the mid and lower thoracic spine. No fracture or worrisome bone lesion. Review of the MIP images confirms the above findings. IMPRESSION: 1. Pulmonary embolus in right upper lobe branch of the pulmonary artery extending into the segmental branches. These results will be called to the ordering clinician or representative by the Radiologist Assistant, and communication documented in the PACS or zVision Dashboard. 2. Bibasilar airspace disease favor atelectasis over consolidation, left worse than right. Electronically Signed   By: Lucrezia Europe M.D.   On: 02/27/2018 16:53   Ct Abdomen Pelvis W Contrast  Result Date: 02/27/2018 CLINICAL DATA:  Cough , fever chills and headaches x 4 -6 weeks General abd pai hysterectomy with single oophorectomy Hernia repair with mesh Prev cxr EXAM: CT ABDOMEN AND PELVIS WITH CONTRAST TECHNIQUE: Multidetector CT imaging of the abdomen and pelvis was performed using the standard protocol following bolus administration of intravenous contrast. CONTRAST:  49mL ISOVUE-370 IOPAMIDOL (ISOVUE-370) INJECTION 76% COMPARISON:  09/10/2012  FINDINGS: Lower chest: Bibasilar consolidation/atelectasis left greater than right. No pleural or pericardial effusion. Hepatobiliary: No focal liver abnormality is seen. No gallstones, gallbladder wall thickening, or biliary dilatation. Pancreas: Unremarkable. No pancreatic ductal dilatation or surrounding inflammatory changes. Spleen: Normal in size without focal abnormality. Adrenals/Urinary Tract: Normal adrenals. Normal right kidney. 4.1 cm mid left renal cyst. No hydronephrosis. Urinary bladder is nondistended. Stomach/Bowel: Stomach is incompletely distended with ingested material. Small bowel nondilated. Normal appendix. Moderate proximal colonic fecal material without dilatation. Distal colon decompressed, unremarkable. Vascular/Lymphatic: Mild scattered aortoiliac atheromatous calcifications without aneurysm or stenosis. No abdominal or pelvic adenopathy. Portal vein patent. Reproductive: Status post hysterectomy. No adnexal masses. Other: No ascites.  No free air. Musculoskeletal: Interval ventral hernia mesh repair. Spondylitic changes in the lower lumbar spine. No fracture or worrisome bone lesion. IMPRESSION: 1.  Bilateral lower lobe airspace disease,Left greater than right. 2. No acute abdominal process. 3. Postop and degenerative changes as above. Electronically Signed   By: Lucrezia Europe M.D.   On: 02/27/2018 16:58   Ct Maxillofacial Ltd Wo Cm  Result Date: 01/30/2018 CLINICAL DATA:  Frontal headache and sinus drainage EXAM: CT PARANASAL SINUS LIMITED WITHOUT CONTRAST TECHNIQUE: Non-contiguous multidetector CT images of  the paranasal sinuses were obtained in a single plane without contrast. COMPARISON:  None. FINDINGS: There is a small amount of fluid in the right sphenoid sinus. Trace bilateral maxillary sinus fluid. IMPRESSION: Fluid in the sphenoid and maxillary sinuses may indicate acute sinusitis. Electronically Signed   By: Ulyses Jarred M.D.   On: 01/30/2018 20:26    Time Spent in minutes   30   Lala Lund M.D on 02/28/2018 at 1:37 PM  To page go to [email or web address] - password [email or web address]

## 2018-02-28 NOTE — Progress Notes (Signed)
Pt ambulated 300 feet in the hall for about 5 minutes with no assistive devices or equipment. She tolerated this well with no complaints. Her heart rate went up to 116 bpm.

## 2018-02-28 NOTE — Plan of Care (Signed)
  Problem: Education: Goal: Knowledge of General Education information will improve Description: Including pain rating scale, medication(s)/side effects and non-pharmacologic comfort measures Outcome: Progressing   Problem: Coping: Goal: Level of anxiety will decrease Outcome: Progressing   Problem: Pain Managment: Goal: General experience of comfort will improve Outcome: Progressing   

## 2018-02-28 NOTE — Progress Notes (Signed)
Patient says she only takes Toujeo (Lantus) if blood sugar is above 150. Patient checks blood sugar at home every morning. MD and pharmacy notified.

## 2018-02-28 NOTE — Plan of Care (Signed)
  Problem: Health Behavior/Discharge Planning: Goal: Ability to manage health-related needs will improve Outcome: Progressing   Problem: Clinical Measurements: Goal: Ability to maintain clinical measurements within normal limits will improve Outcome: Progressing   Problem: Clinical Measurements: Goal: Diagnostic test results will improve Outcome: Progressing   

## 2018-02-28 NOTE — Progress Notes (Signed)
Patient with frequent cough, states she wants something with codeine in it. MD paged

## 2018-02-28 NOTE — Progress Notes (Signed)
Patients O2 89% on room air, up to 91% after taking deep breaths. Patient not in any distress. O2 applied PRN.

## 2018-02-28 NOTE — Progress Notes (Signed)
ANTICOAGULATION CONSULT NOTE - Follow Up Consult  Pharmacy Consult for Heparin Indication: pulmonary embolus  Patient Measurements: Height: 5' (152.4 cm) Weight: 223 lb 3.2 oz (101.2 kg) IBW/kg (Calculated) : 45.5 Heparin Dosing Weight: 70 kg  Vital Signs: Temp: 98.9 F (37.2 C) (09/12 2006) Temp Source: Oral (09/12 2006) BP: 120/71 (09/12 2006) Pulse Rate: 84 (09/12 2006)  Labs: Recent Labs    02/27/18 2245 02/27/18 2301 02/28/18 0655 02/28/18 0756 02/28/18 1203 02/28/18 1301 02/28/18 1804  HGB 11.2*  --  10.7*  --   --   --   --   HCT 33.5*  --  32.5*  --   --   --   --   PLT 239  --  266  --   --   --   --   HEPARINUNFRC  --   --   --  1.05*  --   --  0.62  CREATININE 1.25*  --  1.15*  --  1.14*  --   --   TROPONINI  --  <0.03 <0.03  --   --  <0.03  --     Estimated Creatinine Clearance: 53.4 mL/min (A) (by C-G formula based on SCr of 1.14 mg/dL (H)).  Assessment:  67 yr old female on IV heparin for pulmonary embolus per CTA. Pharmacy consulted to dose Heparin.  Heparin level this evening is therapeutic after a rate decrease earlier today (HL 0.62 << 1.05, goal of 0.3-0.7). No bleeding noted.   Goal of Therapy:  Heparin level 0.3-0.7 units/ml Monitor platelets by anticoagulation protocol: Yes   Plan:  - Continue Heparin at 1100 units/hr (11 ml/hr) - Will continue to monitor for any signs/symptoms of bleeding and will follow up with heparin level in 6 hours to confirm therapeutic  Thank you for allowing pharmacy to be a part of this patient's care.  Alycia Rossetti, PharmD, BCPS Clinical Pharmacist Please check AMION for all [place] numbers 02/28/2018 8:07 PM

## 2018-02-28 NOTE — H&P (Signed)
History and Physical    Sierra Johnson BHA:[id number] DOB: [birth year] DOA: 02/27/2018  PCP: Cassandria Anger, MD  Patient coming from: Home.  Chief Complaint: Shortness of breath.  HPI: Sierra Johnson is a 67 y.o. female with history of diabetes mellitus type 2, hypertension who has been experiencing fever and productive cough for last 1 month and had undergone 2 courses of antibiotics last one was last week last Friday about a week ago and also had one course of prednisone has been expressing exertional shortness of breath for last 4 days.  Denies any chest pain.  Had gone to her PCP ordered CT angiogram of the chest which showed right upper lobe pulmonary embolism but no definite strain pattern.  Patient was referred to the ER.  ED Course: In the ER patient EKG shows normal sinus rhythm blood pressure was in the normal range.  Patient was started on heparin.  Blood work show hyponatremia with sodium around 120.  Patient had one episode of nausea vomiting in the ER denies any abdominal pain.  Patient takes hydrochlorothiazide for blood pressure which is not new.  Review of Systems: As per HPI, rest all negative.   Past Medical History:  Diagnosis Date  . ANEMIA-NOS 04/24/2007  . DIABETES MELLITUS, TYPE II 04/24/2007  . Elevated glucose 2011  . HYPERLIPIDEMIA 07/17/2007   mild  . HYPERTENSION 04/24/2007  . Menopause 1998   Dr. Gaetano Net  . OBSTRUCTIVE SLEEP APNEA 09/30/2009   on CPAP 2011  . VITAMIN B12 DEFICIENCY 04/24/2007    Past Surgical History:  Procedure Laterality Date  . ABDOMINAL HYSTERECTOMY    . HERNIA REPAIR  11/11   Incar. midline hernia  . INCISIONAL HERNIA REPAIR N/A 06/03/2014   Procedure: LAPAROSCOPIC REPAIR RECURRENT  INCISIONAL HERNIA;  Surgeon: Georganna Skeans, MD;  Location: [place];  Service: General;  Laterality: N/A;  . INSERTION OF MESH N/A 06/03/2014   Procedure: INSERTION OF MESH;  Surgeon: Georganna Skeans, MD;  Location: [place];  Service: General;   Laterality: N/A;     reports that she has never smoked. She has never used smokeless tobacco. She reports that she does not drink alcohol or use drugs.  Allergies  Allergen Reactions  . Amlodipine Swelling and Other (See Comments)    LE swelling on 10 mg  . Benazepril Swelling and Other (See Comments)    Lip swelling  . Tetracycline Nausea And Vomiting    Family History  Problem Relation Age of Onset  . Heart disease Mother 29       ?CAD  . Cancer Father        lung  . Heart disease Brother 55       CAD  . Hypertension Other     Prior to Admission medications   Medication Sig Start Date End Date Taking? Authorizing Provider  Cholecalciferol (VITAMIN D3) 1000 UNITS CAPS Take 1,000 Units by mouth daily.    Yes [provider]  fish oil-omega-3 fatty acids 1000 MG capsule Take 1 g by mouth 2 (two) times daily.    Yes [provider]  ibuprofen (ADVIL,MOTRIN) 600 MG tablet TAKE 1 TABLET (600 MG TOTAL) BY MOUTH 2 (TWO) TIMES DAILY AS NEEDED FOR MODERATE PAIN. 04/23/17  Yes Plotnikov, [name], MD  Insulin Glargine (TOUJEO SOLOSTAR) 300 UNIT/ML SOPN Inject 18 Units into the skin every morning. 03/20/16  Yes Plotnikov, [name], MD  ipratropium-albuterol (DUONEB) 0.5-2.5 (3) MG/3ML SOLN Take 3 mLs by nebulization every  6 (six) hours as needed. Patient taking differently: Take 3 mLs by nebulization every 6 (six) hours as needed (for shortness of breath or wheezing).  02/20/18  Yes Plotnikov, [name], MD  MAGNESIUM PO Take 1 tablet by mouth 2 (two) times daily.   Yes [provider]  metFORMIN (GLUCOPHAGE) 500 MG tablet TAKE 1 TABLET BY MOUTH 3 TIMES DAILY Patient taking differently: Take 500 mg by mouth 3 (three) times daily.  05/06/17  Yes Plotnikov, [name], MD  Methylsulfonylmethane (MSM PO) Take 1 tablet by mouth 2 (two) times daily.   Yes [provider]  promethazine-codeine (PHENERGAN WITH CODEINE) 6.25-10 MG/5ML syrup Take 5 mLs by mouth  every 4 (four) hours as needed. Patient taking differently: Take 5 mLs by mouth every 4 (four) hours as needed for cough.  02/20/18  Yes Plotnikov, [name], MD  triamterene-hydrochlorothiazide (MAXZIDE-25) 37.5-25 MG tablet Take 1 tablet by mouth daily. Patient taking differently: Take 1 tablet by mouth at bedtime.  02/08/18  Yes Plotnikov, [name], MD  vitamin B-12 (CYANOCOBALAMIN) 1000 MCG tablet Take 1,000 mcg by mouth daily.     Yes [provider]  albuterol (PROVENTIL HFA;VENTOLIN HFA) 108 (90 Base) MCG/ACT inhaler Inhale 2 puffs into the lungs every 4 (four) hours as needed for wheezing or shortness of breath. Dispense with aerochamber Patient not taking: Reported on 02/28/2018 12/10/15   Melynda Ripple, MD  benzonatate (TESSALON) 100 MG capsule Take 1 capsule (100 mg total) by mouth every 8 (eight) hours. Patient not taking: Reported on 02/28/2018 01/20/18   Deno Etienne, DO  FREESTYLE INSULINX TEST test strip 1 EACH BY OTHER ROUTE 2 (TWO) TIMES DAILY. USE TO CHECK BLOOD SUGARS TWICE A DAY Patient taking differently: 1 each by Other route 2 (two) times daily.  01/30/18   Plotnikov, [name], MD  glucose monitoring kit (FREESTYLE) monitoring kit 1 each by Does not apply route 2 (two) times daily. Dx: E11.9, I10 10/15/15   Plotnikov, [name], MD  Insulin Pen Needle 31G X 5 MM MISC 1 Units by Does not apply route 4 (four) times daily. 12/22/16   Plotnikov, [name], MD  Lancets (FREESTYLE) lancets 1 each by Other route 2 (two) times daily. Dx: E11.9, I10 04/13/16   Plotnikov, [name], MD  levofloxacin (LEVAQUIN) 500 MG tablet Take 1 tablet (500 mg total) by mouth daily. Patient not taking: Reported on 02/28/2018 02/15/18   Marrian Salvage, FNP  predniSONE (DELTASONE) 20 MG tablet Take 1 tablet (20 mg total) by mouth daily with breakfast. Patient not taking: Reported on 02/28/2018 02/15/18   Marrian Salvage, FNP  Spacer/Aero-Holding Chambers (AEROCHAMBER PLUS) inhaler Use as  instructed 12/10/15   Melynda Ripple, MD    Physical Exam: Vitals:   02/27/18 2125 02/27/18 2300 02/28/18 0000  BP: (!) 143/92    Pulse: 88  81  Resp: 18  (!) 21  Temp: 98.6 F (37 C)    TempSrc: Oral    SpO2: 96%  95%  Weight:  100.2 kg   Height:  5' (1.524 m)       Constitutional: Moderately built and nourished. Vitals:   02/27/18 2125 02/27/18 2300 02/28/18 0000  BP: (!) 143/92    Pulse: 88  81  Resp: 18  (!) 21  Temp: 98.6 F (37 C)    TempSrc: Oral    SpO2: 96%  95%  Weight:  100.2 kg   Height:  5' (1.524 m)    Eyes: Anicteric  no pallor. ENMT: No discharge from the ears eyes nose or mouth. Neck: No JVD appreciated no mass found. Respiratory: No rhonchi or crepitations. Cardiovascular: S1-S2 heard no murmurs appreciated. Abdomen: Soft nontender bowel sounds present. Musculoskeletal: No edema.  No joint effusion. Skin: No rash. Neurologic: Alert awake oriented to time place and person.  Moves all extremities. Psychiatric: Appears normal per normal affect.   Labs on Admission: I have personally reviewed following labs and imaging studies  CBC: Recent Labs  Lab 02/27/18 2245  WBC 7.2  NEUTROABS 3.3  HGB 11.2*  HCT 33.5*  MCV 80.7  PLT 096   Basic Metabolic Panel: Recent Labs  Lab 02/26/18 1134 02/27/18 2245  NA 121* 120*  K 3.3* 3.4*  CL 87* 82*  CO2 25 26  GLUCOSE 160* 150*  BUN 15 10  CREATININE 1.16 1.25*  CALCIUM 9.5 9.0   GFR: Estimated Creatinine Clearance: 48.4 mL/min (A) (by C-G formula based on SCr of 1.25 mg/dL (H)). Liver Function Tests: Recent Labs  Lab 02/27/18 2245  AST 29  ALT 22  ALKPHOS 57  BILITOT 0.6  PROT 7.4  ALBUMIN 2.8*   No results for input(s): LIPASE, AMYLASE in the last 168 hours. No results for input(s): AMMONIA in the last 168 hours. Coagulation Profile: No results for input(s): INR, PROTIME in the last 168 hours. Cardiac Enzymes: Recent Labs  Lab 02/27/18 2301  TROPONINI <0.03   BNP (last 3  results) No results for input(s): PROBNP in the last 8760 hours. HbA1C: No results for input(s): HGBA1C in the last 72 hours. CBG: No results for input(s): GLUCAP in the last 168 hours. Lipid Profile: No results for input(s): CHOL, HDL, LDLCALC, TRIG, CHOLHDL, LDLDIRECT in the last 72 hours. Thyroid Function Tests: No results for input(s): TSH, T4TOTAL, FREET4, T3FREE, THYROIDAB in the last 72 hours. Anemia Panel: No results for input(s): VITAMINB12, FOLATE, FERRITIN, TIBC, IRON, RETICCTPCT in the last 72 hours. Urine analysis:    Component Value Date/Time   COLORURINE YELLOW 02/11/2018 0802   APPEARANCEUR CLEAR 02/11/2018 0802   LABSPEC 1.015 02/11/2018 0802   PHURINE 7.5 02/11/2018 0802   GLUCOSEU NEGATIVE 02/11/2018 0802   HGBUR NEGATIVE 02/11/2018 0802   BILIRUBINUR NEGATIVE 02/11/2018 0802   KETONESUR NEGATIVE 02/11/2018 0802   PROTEINUR 100 (A) 02/04/2018 1556   UROBILINOGEN 1.0 02/11/2018 0802   NITRITE NEGATIVE 02/11/2018 0802   LEUKOCYTESUR NEGATIVE 02/11/2018 0802   Sepsis Labs: _0 (procalcitonin:4,lacticidven:4) ) Recent Results (from the past 240 hour(s))  MYCOBACTERIA, CULTURE, WITH FLUOROCHROME SMEAR     Status: None (Preliminary result)   Collection Time: 02/21/18 10:17 AM  Result Value Ref Range Status   MICRO NUMBER: [id number]  Preliminary   SPECIMEN QUALITY: ADEQUATE  Preliminary   Source: SPUTUM  Preliminary   STATUS: PRELIMINARY  Preliminary   SMEAR: No acid fast bacilli seen.  Preliminary   RESULT:   Preliminary    Culture results to follow. Final reports of negative cultures can be expected in approximately six weeks. Positive cultures are reported immediately.  Respiratory or Resp and Sputum Culture     Status: Abnormal   Collection Time: 02/21/18 10:17 AM  Result Value Ref Range Status   MICRO NUMBER: [id number]  Final   SPECIMEN QUALITY: ADEQUATE  Final   Source SPUTUM  Final   STATUS: FINAL  Final   GRAM STAIN: (A)  Final    No white  blood cells seen Few Gram positive cocci in pairs epithelial cells   RESULT: Growth  of normal oropharyngeal flora.  Final  Blood culture (routine single)     Status: None (Preliminary result)   Collection Time: 02/26/18 11:34 AM  Result Value Ref Range Status   MICRO NUMBER: [id number]  Preliminary   SPECIMEN QUALITY: ADEQUATE  Preliminary   Source NOT GIVEN  Preliminary   STATUS: PRELIMINARY  Preliminary   Result:   Preliminary    No growth to date. Culture is examined daily for a total of 7 days incubation. A change in status will result in a phone report followed by an updated printed culture report.   COMMENT:   Preliminary    We received non-standard blood culture bottles with no test indicated. Based upon the specimen submitted, a blood culture test was performed. If this is not what you intended to order, please contact your local client service representative immediately  so that we can adjust our billing appropriately. You may also inquire about alternative or additional testing. Aerobic and anaerobic bottle received.      Radiological Exams on Admission: Ct Angio Chest Pe W Or Wo Contrast  Result Date: 02/27/2018 CLINICAL DATA:  Cough , fever chills and headaches x 4 -6 weeks General abd pai hysterectomy with single oophorectomy Hernia repair with mesh Prev cxr EXAM: CT ANGIOGRAPHY CHEST WITH CONTRAST TECHNIQUE: Multidetector CT imaging of the chest was performed using the standard protocol during bolus administration of intravenous contrast. Multiplanar CT image reconstructions and MIPs were obtained to evaluate the vascular anatomy. CONTRAST:  7m ISOVUE-370 IOPAMIDOL (ISOVUE-370) INJECTION 76% COMPARISON:  None. FINDINGS: Cardiovascular: Heart size normal. No pericardial effusion. Embolus in right upper lobe branch of the pulmonary artery extending into the segmental branches. No left-sided emboli. Fair contrast opacification of the thoracic aorta, with no evidence of dissection,  aneurysm, or stenosis. There is classic 3-vessel brachiocephalic arch anatomy without proximal stenosis. No significant atheromatous irregularity. Visualized proximal abdominal aorta unremarkable. Mediastinum/Nodes: No hilar or mediastinal adenopathy. Lungs/Pleura: No pleural effusion. No pneumothorax. Platelike atelectasis laterally in the right lower lobe. Consolidation/atelectasis in posterior and medial basal segments left lower lobe. Upper Abdomen: No acute findings. Musculoskeletal: Anterior vertebral endplate spurring at multiple levels in the mid and lower thoracic spine. No fracture or worrisome bone lesion. Review of the MIP images confirms the above findings. IMPRESSION: 1. Pulmonary embolus in right upper lobe branch of the pulmonary artery extending into the segmental branches. These results will be called to the ordering clinician or representative by the Radiologist Assistant, and communication documented in the PACS or zVision Dashboard. 2. Bibasilar airspace disease favor atelectasis over consolidation, left worse than right. Electronically Signed   By: DLucrezia EuropeM.D.   On: 02/27/2018 16:53   Ct Abdomen Pelvis W Contrast  Result Date: 02/27/2018 CLINICAL DATA:  Cough , fever chills and headaches x 4 -6 weeks General abd pai hysterectomy with single oophorectomy Hernia repair with mesh Prev cxr EXAM: CT ABDOMEN AND PELVIS WITH CONTRAST TECHNIQUE: Multidetector CT imaging of the abdomen and pelvis was performed using the standard protocol following bolus administration of intravenous contrast. CONTRAST:  749mISOVUE-370 IOPAMIDOL (ISOVUE-370) INJECTION 76% COMPARISON:  09/10/2012 FINDINGS: Lower chest: Bibasilar consolidation/atelectasis left greater than right. No pleural or pericardial effusion. Hepatobiliary: No focal liver abnormality is seen. No gallstones, gallbladder wall thickening, or biliary dilatation. Pancreas: Unremarkable. No pancreatic ductal dilatation or surrounding inflammatory  changes. Spleen: Normal in size without focal abnormality. Adrenals/Urinary Tract: Normal adrenals. Normal right kidney. 4.1 cm mid left renal cyst. No hydronephrosis. Urinary bladder  is nondistended. Stomach/Bowel: Stomach is incompletely distended with ingested material. Small bowel nondilated. Normal appendix. Moderate proximal colonic fecal material without dilatation. Distal colon decompressed, unremarkable. Vascular/Lymphatic: Mild scattered aortoiliac atheromatous calcifications without aneurysm or stenosis. No abdominal or pelvic adenopathy. Portal vein patent. Reproductive: Status post hysterectomy. No adnexal masses. Other: No ascites.  No free air. Musculoskeletal: Interval ventral hernia mesh repair. Spondylitic changes in the lower lumbar spine. No fracture or worrisome bone lesion. IMPRESSION: 1.  Bilateral lower lobe airspace disease,Left greater than right. 2. No acute abdominal process. 3. Postop and degenerative changes as above. Electronically Signed   By: Lucrezia Europe M.D.   On: 02/27/2018 16:58    EKG: Independently reviewed.  Normal sinus rhythm.  Assessment/Plan Active Problems:   Acute pulmonary embolism (HCC)   Pulmonary embolus, right ([place])    1. Acute pulmonary embolism unprovoked hemodynamically stable -patient placed on heparin infusion which can be changed to oral anticoagulation if patient remains stable.  Check Dopplers of the lower extremity.  We will cycle cardiac markers.  Patient denies any recent travel or recent surgery or any family history of pulmonary embolism or use of contraceptive pills. 2. Hyponatremia appears to be new.  Will discontinue hydrochlorothiazide gently hydrate closely follow metabolic panel.  Check urine for sodium osmolality check TSH and cortisol levels. 3. Fever and chill has been ongoing for last 1 month.  Will check procalcitonin and blood cultures.  Chest x-ray and CT scan was showing some airspace opacities but no definite signs of  pneumonia. 4. Hypertension we will hold hydrochlorothiazide due to hyponatremia.  Will keep patient on PRN IV hydralazine. 5. History of diabetes mellitus type 2 -takes long-acting insulin only once blood sugar is more than 120 in the morning.  Will keep patient on sliding scale coverage for now.   DVT prophylaxis: Lovenox. Code Status: Full code. Family Communication: Patient husband. Disposition Plan: Home. Consults called: None. Admission status: Observation.   Rise Patience MD [facility] Pager [phone].  If 7PM-7AM, please contact night-coverage [email or web address] Password [facility]  02/28/2018, 12:58 AM

## 2018-03-01 ENCOUNTER — Observation Stay (HOSPITAL_BASED_OUTPATIENT_CLINIC_OR_DEPARTMENT_OTHER): Payer: BLUE CROSS/BLUE SHIELD

## 2018-03-01 DIAGNOSIS — R609 Edema, unspecified: Secondary | ICD-10-CM | POA: Diagnosis not present

## 2018-03-01 DIAGNOSIS — I2699 Other pulmonary embolism without acute cor pulmonale: Secondary | ICD-10-CM | POA: Diagnosis not present

## 2018-03-01 DIAGNOSIS — R0600 Dyspnea, unspecified: Secondary | ICD-10-CM | POA: Diagnosis not present

## 2018-03-01 LAB — BASIC METABOLIC PANEL
Anion gap: 12 (ref 5–15)
BUN: 6 mg/dL — AB (ref 8–23)
CHLORIDE: 89 mmol/L — AB (ref 98–111)
CO2: 25 mmol/L (ref 22–32)
Calcium: 8.9 mg/dL (ref 8.9–10.3)
Creatinine, Ser: 1.13 mg/dL — ABNORMAL HIGH (ref 0.44–1.00)
GFR calc Af Amer: 58 mL/min — ABNORMAL LOW (ref >60)
GFR calc non Af Amer: 50 mL/min — ABNORMAL LOW (ref >60)
GLUCOSE: 130 mg/dL — AB (ref 70–99)
POTASSIUM: 4.1 mmol/L (ref 3.5–5.1)
Sodium: 126 mmol/L — ABNORMAL LOW (ref 135–145)

## 2018-03-01 LAB — CBC
HCT: 32 % — ABNORMAL LOW (ref 36.0–46.0)
HCT: 31.8 % — ABNORMAL LOW (ref 36.0–46.0)
Hemoglobin: 10.5 g/dL — ABNORMAL LOW (ref 12.0–15.0)
Hemoglobin: 10.6 g/dL — ABNORMAL LOW (ref 12.0–15.0)
MCH: 27.2 pg (ref 26.0–34.0)
MCH: 26.7 pg (ref 26.0–34.0)
MCHC: 32.8 g/dL (ref 30.0–36.0)
MCHC: 33.3 g/dL (ref 30.0–36.0)
MCV: 81.5 fL (ref 78.0–100.0)
MCV: 81.4 fL (ref 78.0–100.0)
PLATELETS: 260 10*3/uL (ref 150–400)
PLATELETS: 283 10*3/uL (ref 150–400)
RBC: 3.9 MIL/uL (ref 3.87–5.11)
RBC: 3.93 MIL/uL (ref 3.87–5.11)
RDW: 14.6 % (ref 11.5–15.5)
RDW: 14.6 % (ref 11.5–15.5)
WBC: 8.6 10*3/uL (ref 4.0–10.5)
WBC: 9 10*3/uL (ref 4.0–10.5)

## 2018-03-01 LAB — GLUCOSE, CAPILLARY
GLUCOSE-CAPILLARY: 146 mg/dL — AB (ref 70–99)
Glucose-Capillary: 114 mg/dL — ABNORMAL HIGH (ref 70–99)
Glucose-Capillary: 162 mg/dL — ABNORMAL HIGH (ref 70–99)
Glucose-Capillary: 151 mg/dL — ABNORMAL HIGH (ref 70–99)

## 2018-03-01 LAB — HEPARIN LEVEL (UNFRACTIONATED)
Heparin Unfractionated: 0.71 IU/mL — ABNORMAL HIGH (ref 0.30–0.70)
Heparin Unfractionated: 0.6 IU/mL (ref 0.30–0.70)

## 2018-03-01 LAB — ECHOCARDIOGRAM COMPLETE
Height: 60 in
Weight: 3576 oz

## 2018-03-01 LAB — MAGNESIUM: Magnesium: 1.7 mg/dL (ref 1.7–2.4)

## 2018-03-01 MED ORDER — APIXABAN 5 MG PO TABS: 5.0000 mg | ORAL_TABLET | Freq: Two times a day (BID) | ORAL | Status: DC

## 2018-03-01 MED ORDER — MAGNESIUM HYDROXIDE 400 MG/5ML PO SUSP: 30.0000 mL | Freq: Every day | ORAL | Status: DC | PRN

## 2018-03-01 MED ORDER — HEPARIN (PORCINE) IN NACL 100-0.45 UNIT/ML-% IJ SOLN
1050.0000 [IU]/h | INTRAMUSCULAR | Status: AC
  Administered 2018-03-01: 1050 [IU]/h via INTRAVENOUS

## 2018-03-01 MED ORDER — APIXABAN 5 MG PO TABS
10.0000 mg | ORAL_TABLET | Freq: Two times a day (BID) | ORAL | Status: DC
  Administered 2018-03-01 – 2018-03-02 (×2): 10 mg via ORAL
  Filled 2018-03-01 (×3): qty 2

## 2018-03-01 NOTE — Progress Notes (Signed)
ANTICOAGULATION CONSULT NOTE - Follow Up Consult  Pharmacy Consult for Heparin Indication: pulmonary embolus  Patient Measurements: Height: 5' (152.4 cm) Weight: 223 lb 8 oz (101.4 kg) IBW/kg (Calculated) : 45.5 Heparin Dosing Weight: 70 kg  Vital Signs: Temp: 99.6 F (37.6 C) (09/13 0615) Temp Source: Oral (09/13 0615) BP: 120/75 (09/13 0615) Pulse Rate: 100 (09/13 0615)  Labs: Recent Labs    02/27/18 2301 02/28/18 0655  02/28/18 1203 02/28/18 1301 02/28/18 1804 03/01/18 0014 03/01/18 0835  HGB  --  10.7*  --   --   --   --  10.5* 10.6*  HCT  --  32.5*  --   --   --   --  32.0* 31.8*  PLT  --  266  --   --   --   --  283 260  HEPARINUNFRC  --   --    < >  --   --  0.62 0.71* 0.60  CREATININE  --  1.15*  --  1.14*  --   --  1.13*  --   TROPONINI <0.03 <0.03  --   --  <0.03  --   --   --    < > = values in this interval not displayed.    Estimated Creatinine Clearance: 53.9 mL/min (A) (by C-G formula based on SCr of 1.13 mg/dL (H)).  Assessment:  67 yr old female continues on IV heparin for pulmonary embolus per CTA on 02/27/18.  Dopplers pending.   Heparin level is therapeutic (0.60) on 1050 units/hr. CBC stable.  Goal of Therapy:  Heparin level 0.3-0.7 units/ml Monitor platelets by anticoagulation protocol: Yes   Plan:   Continue heparin drip at 1050 units/hr  Daily heparin level and CBC while on heparin.  Follow up oral anticoagulation plan.  Arty Baumgartner, [place] Pager: [phone] or phone: [phone] 03/01/2018,9:55 AM

## 2018-03-01 NOTE — Progress Notes (Signed)
  Echocardiogram 2D Echocardiogram has been performed.  Matilde Bash 03/01/2018, 11:19 AM

## 2018-03-01 NOTE — Progress Notes (Addendum)
ANTICOAGULATION CONSULT NOTE - Follow Up Consult  Pharmacy Consult for Heparin Indication: pulmonary embolus  Patient Measurements: Height: 5' (152.4 cm) Weight: 223 lb 3.2 oz (101.2 kg) IBW/kg (Calculated) : 45.5 Heparin Dosing Weight: 70 kg  Vital Signs: Temp: 98.7 F (37.1 C) (09/13 0150) Temp Source: Oral (09/13 0150) BP: 119/72 (09/13 0150) Pulse Rate: 92 (09/13 0150)  Labs: Recent Labs    02/27/18 2245 02/27/18 2301 02/28/18 0655 02/28/18 0756 02/28/18 1203 02/28/18 1301 02/28/18 1804 03/01/18 0014  HGB 11.2*  --  10.7*  --   --   --   --  10.5*  HCT 33.5*  --  32.5*  --   --   --   --  32.0*  PLT 239  --  266  --   --   --   --  283  HEPARINUNFRC  --   --   --  1.05*  --   --  0.62 0.71*  CREATININE 1.25*  --  1.15*  --  1.14*  --   --   --   TROPONINI  --  <0.03 <0.03  --   --  <0.03  --   --     Estimated Creatinine Clearance: 53.4 mL/min (A) (by C-G formula based on SCr of 1.14 mg/dL (H)).  Assessment:  67 yr old female on IV heparin for pulmonary embolus per CTA. Pharmacy consulted to dose Heparin.  6 hour heparin level = 0.71, on heparin drip 1100 units/hr. Slight increase in heparin level from previous level on same heparin rate. Goal 0.3-0.7 units/ml.  No bleeding noted.   Goal of Therapy:  Heparin level 0.3-0.7 units/ml Monitor platelets by anticoagulation protocol: Yes   Plan:  Decrease Heparin to 1050 units/hr.  Reschedule daily heparin level to 6 hours post rate change. Will continue to monitor for any signs/symptoms of bleeding.  Thank you for allowing pharmacy to be a part of this patient's care.  Nicole Cella, RPh Clinical Pharmacist Please check AMION for all [facility] numbers 03/01/2018 1:56 AM

## 2018-03-01 NOTE — Progress Notes (Signed)
ANTICOAGULATION CONSULT NOTE - Follow Up Consult  Pharmacy Consult for apixaban Indication: pulmonary embolus  Patient Measurements: Height: 5' (152.4 cm) Weight: 223 lb 8 oz (101.4 kg) IBW/kg (Calculated) : 45.5  Vital Signs: Temp: 98.7 F (37.1 C) (09/13 1157) Temp Source: Oral (09/13 1157) BP: 116/73 (09/13 1157) Pulse Rate: 93 (09/13 1157)  Labs: Recent Labs    02/27/18 2301 02/28/18 0655  02/28/18 1203 02/28/18 1301 02/28/18 1804 03/01/18 0014 03/01/18 0835  HGB  --  10.7*  --   --   --   --  10.5* 10.6*  HCT  --  32.5*  --   --   --   --  32.0* 31.8*  PLT  --  266  --   --   --   --  283 260  HEPARINUNFRC  --   --    < >  --   --  0.62 0.71* 0.60  CREATININE  --  1.15*  --  1.14*  --   --  1.13*  --   TROPONINI <0.03 <0.03  --   --  <0.03  --   --   --    < > = values in this interval not displayed.    Estimated Creatinine Clearance: 53.9 mL/min (A) (by C-G formula based on SCr of 1.13 mg/dL (H)).  Assessment: 67 yr old female who continues on IV heparin for pulmonary embolus per CTA on 02/27/18.  Bilateral LE duplex negative for DVT. Pharmacy consulted to transition to apixaban.  Plan:  Discontinue heparin drip  Apixaban 10 mg PO bid for 7 days then 5 mg PO bid Education prior to discharge Pharmacy signing off but will continue to follow peripherally    Renold Genta, PharmD, BCPS Clinical Pharmacist Clinical phone for 03/01/2018 until 10p is [phone] Please check AMION for all Pharmacist numbers by unit 03/01/2018 4:43 PM

## 2018-03-01 NOTE — Progress Notes (Signed)
*  Preliminary Results* Bilateral lower extremity venous duplex completed. Bilateral lower extremities are negative for deep vein thrombosis. There is no evidence of Baker's cyst bilaterally.  03/01/2018 11:15 AM Abram Sander

## 2018-03-01 NOTE — Progress Notes (Signed)
PROGRESS NOTE    Sierra Johnson  ZOX:[id number] DOB: Sep 05, 1950 DOA: 02/27/2018 PCP: Cassandria Anger, MD   Brief Narrative: Brief Narrative  Sierra Kalb Fosteris a 67 y.o.femalewithhistory of diabetes mellitus type 2, hypertension who has been experiencing fever and productive cough for last 1 month and had undergone 2 courses of antibiotics last one was last week last Friday about a week ago and also had one course of prednisone has been expressing exertional shortness of breath for last 4 days. Denies any chest pain. Had gone to her PCP ordered CT angiogram of the chest which showed right upper lobe pulmonary embolism and was admitted for further treatment.   Assessment & Plan:   Principal Problem:   Pulmonary embolus, right (HCC) Active Problems:   Obstructive sleep apnea   Essential hypertension   Diabetes mellitus type 2 in obese ([place])   Acute pulmonary embolism (HCC)   Hyponatremia  1-Acute right upper lobe PE>  ECHO no right side heart strain.  On Heparin gtt.  Will plan to transition to eliquis today. Will check oxygen sat first.  Doppler negative.  Risk benefit of eliquis discussed with patient.   Hyponatremia;  Agree with holding HCTZ.  Received IV fluids.  Repeat labs in am.   Fever, ? Related to PE. Work up for fever unrevealing. No leukocytosis.   DM SSI.       DVT prophylaxis: heparin  Code Status: Full code.  Family Communication: care discussed with patient  Disposition Plan: home when stable.   Consultants:   none   Procedures:   ECHO normal ef  Doppler. negative  Antimicrobials:  none  Subjective: She is still SOB, denies worsening dyspnea.   Objective: Vitals:   02/28/18 2006 03/01/18 0150 03/01/18 0615 03/01/18 1157  BP: 120/71 119/72 120/75 116/73  Pulse: 84 92 100 93  Resp: 18 18 18 18   Temp: 98.9 F (37.2 C) 98.7 F (37.1 C) 99.6 F (37.6 C) 98.7 F (37.1 C)  TempSrc: Oral Oral Oral Oral  SpO2: 98% 94% 97% 94%    Weight:   101.4 kg   Height:        Intake/Output Summary (Last 24 hours) at 03/01/2018 1404 Last data filed at 03/01/2018 1050 Gross per 24 hour  Intake 1225.24 ml  Output 1650 ml  Net -424.76 ml   Filed Weights   02/27/18 2300 02/28/18 0625 03/01/18 0615  Weight: 100.2 kg 101.2 kg 101.4 kg    Examination:  General exam: Appears calm and comfortable  Respiratory system: Clear to auscultation. Respiratory effort normal. Cardiovascular system: S1 & S2 heard, RRR. No JVD, murmurs, rubs, gallops or clicks. No pedal edema. Gastrointestinal system: Abdomen is nondistended, soft and nontender. No organomegaly or masses felt. Normal bowel sounds heard. Central nervous system: Alert and oriented. No focal neurological deficits. Extremities: Symmetric 5 x 5 power. Skin: No rashes, lesions or ulcers Psychiatry: Judgement and insight appear normal. Mood & affect appropriate.     Data Reviewed: I have personally reviewed following labs and imaging studies  CBC: Recent Labs  Lab 02/27/18 2245 02/28/18 0655 03/01/18 0014 03/01/18 0835  WBC 7.2 7.0 8.6 9.0  NEUTROABS 3.3  --   --   --   HGB 11.2* 10.7* 10.5* 10.6*  HCT 33.5* 32.5* 32.0* 31.8*  MCV 80.7 80.8 81.4 81.5  PLT 239 266 283 811   Basic Metabolic Panel: Recent Labs  Lab 02/26/18 1134 02/27/18 2245 02/28/18 0655 02/28/18 1203 03/01/18 0014  NA 121*  120* 124* 125* 126*  K 3.3* 3.4* 3.7 3.8 4.1  CL 87* 82* 87* 86* 89*  CO2 25 26 26 28 25   GLUCOSE 160* 150* 106* 98 130*  BUN 15 10 8  7* 6*  CREATININE 1.16 1.25* 1.15* 1.14* 1.13*  CALCIUM 9.5 9.0 8.9 8.8* 8.9  MG  --   --   --   --  1.7   GFR: Estimated Creatinine Clearance: 53.9 mL/min (A) (by C-G formula based on SCr of 1.13 mg/dL (H)). Liver Function Tests: Recent Labs  Lab 02/27/18 2245  AST 29  ALT 22  ALKPHOS 57  BILITOT 0.6  PROT 7.4  ALBUMIN 2.8*   No results for input(s): LIPASE, AMYLASE in the last 168 hours. No results for input(s): AMMONIA  in the last 168 hours. Coagulation Profile: No results for input(s): INR, PROTIME in the last 168 hours. Cardiac Enzymes: Recent Labs  Lab 02/27/18 2301 02/28/18 0655 02/28/18 1301  TROPONINI <0.03 <0.03 <0.03   BNP (last 3 results) No results for input(s): PROBNP in the last 8760 hours. HbA1C: No results for input(s): HGBA1C in the last 72 hours. CBG: Recent Labs  Lab 02/28/18 1127 02/28/18 1611 02/28/18 2037 03/01/18 0805 03/01/18 1156  GLUCAP 117* 139* 181* 114* 162*   Lipid Profile: No results for input(s): CHOL, HDL, LDLCALC, TRIG, CHOLHDL, LDLDIRECT in the last 72 hours. Thyroid Function Tests: No results for input(s): TSH, T4TOTAL, FREET4, T3FREE, THYROIDAB in the last 72 hours. Anemia Panel: No results for input(s): VITAMINB12, FOLATE, FERRITIN, TIBC, IRON, RETICCTPCT in the last 72 hours. Sepsis Labs: Recent Labs  Lab 02/28/18 0756  PROCALCITON <0.10    Recent Results (from the past 240 hour(s))  MYCOBACTERIA, CULTURE, WITH FLUOROCHROME SMEAR     Status: None (Preliminary result)   Collection Time: 02/21/18 10:17 AM  Result Value Ref Range Status   MICRO NUMBER: [id number]  Preliminary   SPECIMEN QUALITY: ADEQUATE  Preliminary   Source: SPUTUM  Preliminary   STATUS: PRELIMINARY  Preliminary   SMEAR: No acid fast bacilli seen.  Preliminary   RESULT:   Preliminary    Culture results to follow. Final reports of negative cultures can be expected in approximately six weeks. Positive cultures are reported immediately.  Respiratory or Resp and Sputum Culture     Status: Abnormal   Collection Time: 02/21/18 10:17 AM  Result Value Ref Range Status   MICRO NUMBER: [id number]  Final   SPECIMEN QUALITY: ADEQUATE  Final   Source SPUTUM  Final   STATUS: FINAL  Final   GRAM STAIN: (A)  Final    No white blood cells seen Few Gram positive cocci in pairs epithelial cells   RESULT: Growth of normal oropharyngeal flora.  Final  Blood culture (routine single)     Status:  None (Preliminary result)   Collection Time: 02/26/18 11:34 AM  Result Value Ref Range Status   MICRO NUMBER: [id number]  Preliminary   SPECIMEN QUALITY: ADEQUATE  Preliminary   Source NOT GIVEN  Preliminary   STATUS: PRELIMINARY  Preliminary   Result:   Preliminary    No growth to date. Culture is examined daily for a total of 7 days incubation. A change in status will result in a phone report followed by an updated printed culture report.   COMMENT:   Preliminary    We received non-standard blood culture bottles with no test indicated. Based upon the specimen submitted, a blood culture test was performed. If this is not what you  intended to order, please contact your local client service representative immediately  so that we can adjust our billing appropriately. You may also inquire about alternative or additional testing. Aerobic and anaerobic bottle received.   Culture, blood (routine x 2)     Status: None (Preliminary result)   Collection Time: 02/28/18  7:59 AM  Result Value Ref Range Status   Specimen Description BLOOD RIGHT ANTECUBITAL  Final   Special Requests   Final    BOTTLES DRAWN AEROBIC AND ANAEROBIC Blood Culture results may not be optimal due to an excessive volume of blood received in culture bottles   Culture   Final    NO GROWTH < 24 HOURS Performed at [facility] [address] [place], [place] [address]    Report Status PENDING  Incomplete  Culture, blood (routine x 2)     Status: None (Preliminary result)   Collection Time: 02/28/18  7:59 AM  Result Value Ref Range Status   Specimen Description BLOOD RIGHT HAND  Final   Special Requests   Final    BOTTLES DRAWN AEROBIC AND ANAEROBIC Blood Culture adequate volume   Culture   Final    NO GROWTH < 24 HOURS Performed at [facility], [place] [address] [place], [place] [address]    Report Status PENDING  Incomplete         Radiology Studies: Ct Angio Chest Pe W Or Wo Contrast  Result Date:  02/27/2018 CLINICAL DATA:  Cough , fever chills and headaches x 4 -6 weeks General abd pai hysterectomy with single oophorectomy Hernia repair with mesh Prev cxr EXAM: CT ANGIOGRAPHY CHEST WITH CONTRAST TECHNIQUE: Multidetector CT imaging of the chest was performed using the standard protocol during bolus administration of intravenous contrast. Multiplanar CT image reconstructions and MIPs were obtained to evaluate the vascular anatomy. CONTRAST:  59mL ISOVUE-370 IOPAMIDOL (ISOVUE-370) INJECTION 76% COMPARISON:  None. FINDINGS: Cardiovascular: Heart size normal. No pericardial effusion. Embolus in right upper lobe branch of the pulmonary artery extending into the segmental branches. No left-sided emboli. Fair contrast opacification of the thoracic aorta, with no evidence of dissection, aneurysm, or stenosis. There is classic 3-vessel brachiocephalic arch anatomy without proximal stenosis. No significant atheromatous irregularity. Visualized proximal abdominal aorta unremarkable. Mediastinum/Nodes: No hilar or mediastinal adenopathy. Lungs/Pleura: No pleural effusion. No pneumothorax. Platelike atelectasis laterally in the right lower lobe. Consolidation/atelectasis in posterior and medial basal segments left lower lobe. Upper Abdomen: No acute findings. Musculoskeletal: Anterior vertebral endplate spurring at multiple levels in the mid and lower thoracic spine. No fracture or worrisome bone lesion. Review of the MIP images confirms the above findings. IMPRESSION: 1. Pulmonary embolus in right upper lobe branch of the pulmonary artery extending into the segmental branches. These results will be called to the ordering clinician or representative by the Radiologist Assistant, and communication documented in the PACS or zVision Dashboard. 2. Bibasilar airspace disease favor atelectasis over consolidation, left worse than right. Electronically Signed   By: Lucrezia Europe M.D.   On: 02/27/2018 16:53   Ct Abdomen Pelvis W  Contrast  Result Date: 02/27/2018 CLINICAL DATA:  Cough , fever chills and headaches x 4 -6 weeks General abd pai hysterectomy with single oophorectomy Hernia repair with mesh Prev cxr EXAM: CT ABDOMEN AND PELVIS WITH CONTRAST TECHNIQUE: Multidetector CT imaging of the abdomen and pelvis was performed using the standard protocol following bolus administration of intravenous contrast. CONTRAST:  36mL ISOVUE-370 IOPAMIDOL (ISOVUE-370) INJECTION 76% COMPARISON:  09/10/2012 FINDINGS:  Lower chest: Bibasilar consolidation/atelectasis left greater than right. No pleural or pericardial effusion. Hepatobiliary: No focal liver abnormality is seen. No gallstones, gallbladder wall thickening, or biliary dilatation. Pancreas: Unremarkable. No pancreatic ductal dilatation or surrounding inflammatory changes. Spleen: Normal in size without focal abnormality. Adrenals/Urinary Tract: Normal adrenals. Normal right kidney. 4.1 cm mid left renal cyst. No hydronephrosis. Urinary bladder is nondistended. Stomach/Bowel: Stomach is incompletely distended with ingested material. Small bowel nondilated. Normal appendix. Moderate proximal colonic fecal material without dilatation. Distal colon decompressed, unremarkable. Vascular/Lymphatic: Mild scattered aortoiliac atheromatous calcifications without aneurysm or stenosis. No abdominal or pelvic adenopathy. Portal vein patent. Reproductive: Status post hysterectomy. No adnexal masses. Other: No ascites.  No free air. Musculoskeletal: Interval ventral hernia mesh repair. Spondylitic changes in the lower lumbar spine. No fracture or worrisome bone lesion. IMPRESSION: 1.  Bilateral lower lobe airspace disease,Left greater than right. 2. No acute abdominal process. 3. Postop and degenerative changes as above. Electronically Signed   By: Lucrezia Europe M.D.   On: 02/27/2018 16:58        Scheduled Meds: . cholecalciferol  1,000 Units Oral Daily  . docusate sodium  200 mg Oral BID  . insulin  aspart  0-9 Units Subcutaneous TID WC  . insulin glargine  18 Units Subcutaneous Daily  . magnesium oxide  200 mg Oral BID  . omega-3 acid ethyl esters  1 g Oral BID  . polyethylene glycol  17 g Oral BID  . vitamin B-12  1,000 mcg Oral Daily   Continuous Infusions: . heparin 1,050 Units/hr (03/01/18 0216)     LOS: 0 days    Time spent: 35 minutes.     Elmarie Shiley, MD [facility] Pager [phone]  If 7PM-7AM, please contact night-coverage [email or web address] Password [facility] 03/01/2018, 2:04 PM

## 2018-03-02 DIAGNOSIS — I2699 Other pulmonary embolism without acute cor pulmonale: Secondary | ICD-10-CM | POA: Diagnosis not present

## 2018-03-02 LAB — BASIC METABOLIC PANEL
ANION GAP: 10 (ref 5–15)
BUN: <5 mg/dL — ABNORMAL LOW (ref 8–23)
CALCIUM: 9 mg/dL (ref 8.9–10.3)
CO2: 27 mmol/L (ref 22–32)
CREATININE: 1.11 mg/dL — AB (ref 0.44–1.00)
Chloride: 92 mmol/L — ABNORMAL LOW (ref 98–111)
GFR, EST AFRICAN AMERICAN: 59 mL/min — AB (ref >60)
GFR, EST NON AFRICAN AMERICAN: 51 mL/min — AB (ref >60)
Glucose, Bld: 125 mg/dL — ABNORMAL HIGH (ref 70–99)
Potassium: 4.3 mmol/L (ref 3.5–5.1)
SODIUM: 129 mmol/L — AB (ref 135–145)

## 2018-03-02 LAB — GLUCOSE, CAPILLARY
Glucose-Capillary: 123 mg/dL — ABNORMAL HIGH (ref 70–99)
Glucose-Capillary: 148 mg/dL — ABNORMAL HIGH (ref 70–99)

## 2018-03-02 MED ORDER — APIXABAN 5 MG PO TABS: 10.0000 mg | ORAL_TABLET | Freq: Two times a day (BID) | ORAL | 0 refills | Status: DC

## 2018-03-02 MED ORDER — DOCUSATE SODIUM 100 MG PO CAPS: 200.0000 mg | ORAL_CAPSULE | Freq: Two times a day (BID) | ORAL | 0 refills | Status: DC

## 2018-03-02 MED ORDER — APIXABAN 5 MG PO TABS: 5.0000 mg | ORAL_TABLET | Freq: Two times a day (BID) | ORAL | 0 refills | Status: DC

## 2018-03-02 MED ORDER — ALBUTEROL SULFATE HFA 108 (90 BASE) MCG/ACT IN AERS: 2.0000 | INHALATION_SPRAY | RESPIRATORY_TRACT | 0 refills | Status: DC | PRN

## 2018-03-02 MED ORDER — POLYETHYLENE GLYCOL 3350 17 G PO PACK: 17.0000 g | PACK | Freq: Two times a day (BID) | ORAL | 0 refills | Status: DC

## 2018-03-02 MED ORDER — GUAIFENESIN 100 MG/5ML PO SOLN: 5.0000 mL | ORAL | 0 refills | Status: DC | PRN

## 2018-03-02 NOTE — Progress Notes (Signed)
Case manager aware of eliquis prescription

## 2018-03-02 NOTE — Progress Notes (Signed)
SATURATION QUALIFICATIONS: (This note is used to comply with regulatory documentation for home oxygen)  Patient Saturations on Room Air at Rest = 89%  Patient Saturations on Room Air while Ambulating = 86%  Patient Saturations on 2 Liters of oxygen while Ambulating = 95%  MD and case manager aware  Update provided to pt and family at bedside

## 2018-03-02 NOTE — Discharge Instructions (Signed)
Information on my medicine - ELIQUIS (apixaban)  This medication education was reviewed with me or my healthcare representative as part of my discharge preparation.  Why was Eliquis prescribed for you? Eliquis was prescribed to treat blood clots that may have been found in the veins of your legs (deep vein thrombosis) or in your lungs (pulmonary embolism) and to reduce the risk of them occurring again.  What do You need to know about Eliquis ? The starting dose is 10 mg (two 5 mg tablets) taken TWICE daily for the FIRST SEVEN (7) DAYS, then on 03/09/18 the dose is reduced to ONE 5 mg tablet taken TWICE daily.  Eliquis may be taken with or without food.   Try to take the dose about the same time in the morning and in the evening. If you have difficulty swallowing the tablet whole please discuss with your pharmacist how to take the medication safely.  Take Eliquis exactly as prescribed and DO NOT stop taking Eliquis without talking to the doctor who prescribed the medication.  Stopping may increase your risk of developing a new blood clot.  Refill your prescription before you run out.  After discharge, you should have regular check-up appointments with your healthcare provider that is prescribing your Eliquis.    What do you do if you miss a dose? If a dose of ELIQUIS is not taken at the scheduled time, take it as soon as possible on the same day and twice-daily administration should be resumed. The dose should not be doubled to make up for a missed dose.  Important Safety Information A possible side effect of Eliquis is bleeding. You should call your healthcare provider right away if you experience any of the following: ? Bleeding from an injury or your nose that does not stop. ? Unusual colored urine (red or dark brown) or unusual colored stools (red or black). ? Unusual bruising for unknown reasons. ? A serious fall or if you hit your head (even if there is no bleeding).  Some  medicines may interact with Eliquis and might increase your risk of bleeding or clotting while on Eliquis. To help avoid this, consult your healthcare provider or pharmacist prior to using any new prescription or non-prescription medications, including herbals, vitamins, non-steroidal anti-inflammatory drugs (NSAIDs) and supplements.  This website has more information on Eliquis (apixaban): http://www.eliquis.com/eliquis/home

## 2018-03-02 NOTE — Progress Notes (Signed)
Patient stated she requires no assistance per home unit CPAP at this time. No distress noted, patient aware to contact respiratory if she were to require any assistance at anytime.

## 2018-03-02 NOTE — Progress Notes (Signed)
Pt has both oxygen tanks Pt discharged via wheelchair

## 2018-03-02 NOTE — Progress Notes (Signed)
NT re walked pt on 2L [place], pt walked length of entire hallway, saturation above 92% Pt tolerated well

## 2018-03-02 NOTE — Care Management (Signed)
Pt given 30 day free Eliquis and copay card.  Pt verbalizes understanding and will call ahead to pharmacy to ensure availability.  Will get copay from pharmacist and make plans accordingly for next month's supply.  DME Oxygen referral sent to [facility].  Advised patient the portable oxygen tank will be delivered to their room for transport home and may be 1-2 hours.  [facility] will deliver other oxygen DME to home this afternoon.

## 2018-03-02 NOTE — Plan of Care (Signed)
  Problem: Clinical Measurements: Goal: Will remain free from infection Outcome: Progressing   Problem: Clinical Measurements: Goal: Respiratory complications will improve Outcome: Progressing   Problem: Elimination: Goal: Will not experience complications related to bowel motility Outcome: Progressing

## 2018-03-02 NOTE — Progress Notes (Signed)
Pt discharge provided at bedside with pt and pt husband. Pt has all belongings including printed prescription and eliquis card. Awaiting oxygen delivery. Pt telemetry and IV removed, catheter intact.

## 2018-03-02 NOTE — Discharge Summary (Signed)
Physician Discharge Summary  Sierra Johnson HYQ:[id number] DOB: 13-Apr-1951 DOA: 02/27/2018  PCP: Sierra Anger, MD  Admit date: 02/27/2018 Discharge date: 03/02/2018  Admitted From: Home  Disposition:  Home   Recommendations for Outpatient Follow-up:  1. Follow up with PCP in 1-2 weeks 2. Please obtain BMP/CBC in one week 3. Needs referral to hematologist  4. Needs appropriate screening for malignancy for age  67. If fever persist, will need further evaluation 6. Consider start BP medication, would not start Maxzide due to Hyponatremia  Home oxygen   Discharge Condition: stable.  CODE STATUS: full code.  Diet recommendation:  Carb Modified  Brief/Interim Summary: Brief Narrative: Brief NarrativeDenise C Fosteris a 67 y.o.femalewithhistory of diabetes mellitus type 2, hypertension who has been experiencing fever and productive cough for last 1 month and had undergone 2 courses of antibiotics last one was last week last Friday about a week ago and also had one course of prednisone has been expressing exertional shortness of breath for last 4 days. Denies any chest pain. Had gone to her PCP ordered CT angiogram of the chest which showed right upper lobe pulmonary embolismand was admitted for further treatment.   Assessment & Plan:   Principal Problem:   Pulmonary embolus, right (HCC) Active Problems:   Obstructive sleep apnea   Essential hypertension   Diabetes mellitus type 2 in obese (Sierra Johnson)   Acute pulmonary embolism (HCC)   Hyponatremia  1-Acute right upper lobe PE>  ECHO no right side heart strain.  Treated with heparin Gtt. Transition to eliquis.  Doppler negative.  Risk benefit of eliquis discussed with patient.  she will need home oxygen to be use on ambulation.   Hyponatremia;  Agree with holding HCTZ. (Maxzide)  Received IV fluids.  Improved.  121---129  Fever, ? Related to PE. Work up for fever unrevealing. No leukocytosis.   DM SSI.    Discharge Diagnoses:  Principal Problem:   Pulmonary embolus, right Sierra Johnson) Active Problems:   Obstructive sleep apnea   Essential hypertension   Diabetes mellitus type 2 in obese [facility])   Acute pulmonary embolism (Sierra Johnson)   Hyponatremia    Discharge Instructions  Discharge Instructions    Increase activity slowly   Complete by:  As directed      Allergies as of 03/02/2018      Reactions   Amlodipine Swelling, Other (See Comments)   LE swelling on 10 mg   Benazepril Swelling, Other (See Comments)   Lip swelling   Tetracycline Nausea And Vomiting      Medication List    STOP taking these medications   benzonatate 100 MG capsule Commonly known as:  TESSALON   ibuprofen 600 MG tablet Commonly known as:  ADVIL,MOTRIN   levofloxacin 500 MG tablet Commonly known as:  LEVAQUIN   predniSONE 20 MG tablet Commonly known as:  DELTASONE   triamterene-hydrochlorothiazide 37.5-25 MG tablet Commonly known as:  MAXZIDE-25     TAKE these medications   AEROCHAMBER PLUS inhaler Use as instructed   albuterol 108 (90 Base) MCG/ACT inhaler Commonly known as:  PROVENTIL HFA;VENTOLIN HFA Inhale 2 puffs into the lungs every 4 (four) hours as needed for wheezing or shortness of breath. Dispense with aerochamber   apixaban 5 MG Tabs tablet Commonly known as:  ELIQUIS Take 2 tablets (10 mg total) by mouth every 12 (twelve) hours for 7 days.   apixaban 5 MG Tabs tablet Commonly known as:  ELIQUIS Take 1 tablet (5 mg total) by mouth  every 12 (twelve) hours. Start taking on:  03/08/2018   docusate sodium 100 MG capsule Commonly known as:  COLACE Take 2 capsules (200 mg total) by mouth 2 (two) times daily.   fish oil-omega-3 fatty acids 1000 MG capsule Take 1 g by mouth 2 (two) times daily.   FREESTYLE INSULINX TEST test strip Generic drug:  glucose blood 1 EACH BY OTHER ROUTE 2 (TWO) TIMES DAILY. USE TO CHECK BLOOD SUGARS TWICE A DAY What changed:  See the new instructions.    freestyle lancets 1 each by Other route 2 (two) times daily. Dx: E11.9, I10   glucose monitoring kit monitoring kit 1 each by Does not apply route 2 (two) times daily. Dx: E11.9, I10   guaiFENesin 100 MG/5ML Soln Commonly known as:  ROBITUSSIN Take 5 mLs (100 mg total) by mouth every 4 (four) hours as needed for cough or to loosen phlegm.   Insulin Glargine 300 UNIT/ML Sopn Inject 18 Units into the skin every morning.   Insulin Pen Needle 31G X 5 MM Misc 1 Units by Does not apply route 4 (four) times daily.   ipratropium-albuterol 0.5-2.5 (3) MG/3ML Soln Commonly known as:  DUONEB Take 3 mLs by nebulization every 6 (six) hours as needed. What changed:  reasons to take this   MAGNESIUM PO Take 1 tablet by mouth 2 (two) times daily.   metFORMIN 500 MG tablet Commonly known as:  GLUCOPHAGE TAKE 1 TABLET BY MOUTH 3 TIMES DAILY   MSM PO Take 1 tablet by mouth 2 (two) times daily.   polyethylene glycol packet Commonly known as:  MIRALAX / GLYCOLAX Take 17 g by mouth 2 (two) times daily.   promethazine-codeine 6.25-10 MG/5ML syrup Commonly known as:  PHENERGAN with CODEINE Take 5 mLs by mouth every 4 (four) hours as needed. What changed:  reasons to take this   vitamin B-12 1000 MCG tablet Commonly known as:  CYANOCOBALAMIN Take 1,000 mcg by mouth daily.   Vitamin D3 1000 units Caps Take 1,000 Units by mouth daily.       Allergies  Allergen Reactions  . Amlodipine Swelling and Other (See Comments)    LE swelling on 10 mg  . Benazepril Swelling and Other (See Comments)    Lip swelling  . Tetracycline Nausea And Vomiting    Consultations: none  Procedures/Studies: Dg Chest 2 View  Result Date: 02/15/2018 CLINICAL DATA:  Persistent cough and low-grade fever for 3 weeks, diabetes mellitus, hypertension EXAM: CHEST - 2 VIEW COMPARISON:  02/04/2018 FINDINGS: Normal heart size, mediastinal contours, and pulmonary vascularity. Lungs clear. No pulmonary infiltrate,  pleural effusion or pneumothorax. Multilevel endplate spur formation thoracic spine. IMPRESSION: No acute abnormalities. Electronically Signed   By: Sierra Johnson M.D.   On: 02/15/2018 16:19   Dg Chest 2 View  Result Date: 02/04/2018 CLINICAL DATA:  Fever EXAM: CHEST - 2 VIEW COMPARISON:  01/20/2018 chest radiograph. FINDINGS: Stable cardiomediastinal silhouette with normal heart size. No pneumothorax. No pleural effusion. Lungs appear clear, with no acute consolidative airspace disease and no pulmonary edema. IMPRESSION: No active cardiopulmonary disease. Electronically Signed   By: Ilona Sorrel M.D.   On: 02/04/2018 13:17   Ct Angio Chest Pe W Or Wo Contrast  Result Date: 02/27/2018 CLINICAL DATA:  Cough , fever chills and headaches x 4 -6 weeks General abd pai hysterectomy with single oophorectomy Hernia repair with mesh Prev cxr EXAM: CT ANGIOGRAPHY CHEST WITH CONTRAST TECHNIQUE: Multidetector CT imaging of the chest was performed using  the standard protocol during bolus administration of intravenous contrast. Multiplanar CT image reconstructions and MIPs were obtained to evaluate the vascular anatomy. CONTRAST:  55m ISOVUE-370 IOPAMIDOL (ISOVUE-370) INJECTION 76% COMPARISON:  None. FINDINGS: Cardiovascular: Heart size normal. No pericardial effusion. Embolus in right upper lobe branch of the pulmonary artery extending into the segmental branches. No left-sided emboli. Fair contrast opacification of the thoracic aorta, with no evidence of dissection, aneurysm, or stenosis. There is classic 3-vessel brachiocephalic arch anatomy without proximal stenosis. No significant atheromatous irregularity. Visualized proximal abdominal aorta unremarkable. Mediastinum/Nodes: No hilar or mediastinal adenopathy. Lungs/Pleura: No pleural effusion. No pneumothorax. Platelike atelectasis laterally in the right lower lobe. Consolidation/atelectasis in posterior and medial basal segments left lower lobe. Upper Abdomen: No  acute findings. Musculoskeletal: Anterior vertebral endplate spurring at multiple levels in the mid and lower thoracic spine. No fracture or worrisome bone lesion. Review of the MIP images confirms the above findings. IMPRESSION: 1. Pulmonary embolus in right upper lobe branch of the pulmonary artery extending into the segmental branches. These results will be called to the ordering clinician or representative by the Radiologist Assistant, and communication documented in the PACS or zVision Dashboard. 2. Bibasilar airspace disease favor atelectasis over consolidation, left worse than right. Electronically Signed   By: DLucrezia EuropeM.D.   On: 02/27/2018 16:53   Ct Abdomen Pelvis W Contrast  Result Date: 02/27/2018 CLINICAL DATA:  Cough , fever chills and headaches x 4 -6 weeks General abd pai hysterectomy with single oophorectomy Hernia repair with mesh Prev cxr EXAM: CT ABDOMEN AND PELVIS WITH CONTRAST TECHNIQUE: Multidetector CT imaging of the abdomen and pelvis was performed using the standard protocol following bolus administration of intravenous contrast. CONTRAST:  735mISOVUE-370 IOPAMIDOL (ISOVUE-370) INJECTION 76% COMPARISON:  09/10/2012 FINDINGS: Lower chest: Bibasilar consolidation/atelectasis left greater than right. No pleural or pericardial effusion. Hepatobiliary: No focal liver abnormality is seen. No gallstones, gallbladder wall thickening, or biliary dilatation. Pancreas: Unremarkable. No pancreatic ductal dilatation or surrounding inflammatory changes. Spleen: Normal in size without focal abnormality. Adrenals/Urinary Tract: Normal adrenals. Normal right kidney. 4.1 cm mid left renal cyst. No hydronephrosis. Urinary bladder is nondistended. Stomach/Bowel: Stomach is incompletely distended with ingested material. Small bowel nondilated. Normal appendix. Moderate proximal colonic fecal material without dilatation. Distal colon decompressed, unremarkable. Vascular/Lymphatic: Mild scattered  aortoiliac atheromatous calcifications without aneurysm or stenosis. No abdominal or pelvic adenopathy. Portal vein patent. Reproductive: Status post hysterectomy. No adnexal masses. Other: No ascites.  No free air. Musculoskeletal: Interval ventral hernia mesh repair. Spondylitic changes in the lower lumbar spine. No fracture or worrisome bone lesion. IMPRESSION: 1.  Bilateral lower lobe airspace disease,Left greater than right. 2. No acute abdominal process. 3. Postop and degenerative changes as above. Electronically Signed   By: D Lucrezia Europe.D.   On: 02/27/2018 16:58    Subjective: Feeling better, dyspnea stable  Discharge Exam: Vitals:   03/02/18 0550 03/02/18 0816  BP: 135/86 139/90  Pulse: (!) 105 (!) 107  Resp: 19   Temp: 98.6 F (37 C)   SpO2: 91%    Vitals:   03/01/18 1157 03/01/18 1959 03/02/18 0550 03/02/18 0816  BP: 116/73 122/70 135/86 139/90  Pulse: 93 94 (!) 105 (!) 107  Resp: '18 19 19   '$ Temp: 98.7 F (37.1 C) 100.1 F (37.8 C) 98.6 F (37 C)   TempSrc: Oral Oral Oral   SpO2: 94% 92% 91%   Weight:   100.5 kg   Height:  General: Pt is alert, awake, not in acute distress Cardiovascular: RRR, S1/S2 +, no rubs, no gallops Respiratory: CTA bilaterally, no wheezing, no rhonchi Abdominal: Soft, NT, ND, bowel sounds + Extremities: no edema, no cyanosis    The results of significant diagnostics from this hospitalization (including imaging, microbiology, ancillary and laboratory) are listed below for reference.     Microbiology: Recent Results (from the past 240 hour(s))  MYCOBACTERIA, CULTURE, WITH FLUOROCHROME SMEAR     Status: None (Preliminary result)   Collection Time: 02/21/18 10:17 AM  Result Value Ref Range Status   MICRO NUMBER: [id number]  Preliminary   SPECIMEN QUALITY: ADEQUATE  Preliminary   Source: SPUTUM  Preliminary   STATUS: PRELIMINARY  Preliminary   SMEAR: No acid fast bacilli seen.  Preliminary   RESULT:   Preliminary    Culture  results to follow. Final reports of negative cultures can be expected in approximately six weeks. Positive cultures are reported immediately.  Respiratory or Resp and Sputum Culture     Status: Abnormal   Collection Time: 02/21/18 10:17 AM  Result Value Ref Range Status   MICRO NUMBER: [id number]  Final   SPECIMEN QUALITY: ADEQUATE  Final   Source SPUTUM  Final   STATUS: FINAL  Final   GRAM STAIN: (A)  Final    No white blood cells seen Few Gram positive cocci in pairs epithelial cells   RESULT: Growth of normal oropharyngeal flora.  Final  Blood culture (routine single)     Status: None (Preliminary result)   Collection Time: 02/26/18 11:34 AM  Result Value Ref Range Status   MICRO NUMBER: [id number]  Preliminary   SPECIMEN QUALITY: ADEQUATE  Preliminary   Source NOT GIVEN  Preliminary   STATUS: PRELIMINARY  Preliminary   Result:   Preliminary    No growth to date. Culture is examined daily for a total of 7 days incubation. A change in status will result in a phone report followed by an updated printed culture report.   COMMENT:   Preliminary    We received non-standard blood culture bottles with no test indicated. Based upon the specimen submitted, a blood culture test was performed. If this is not what you intended to order, please contact your local client service representative immediately  so that we can adjust our billing appropriately. You may also inquire about alternative or additional testing. Aerobic and anaerobic bottle received.   Culture, blood (routine x 2)     Status: None (Preliminary result)   Collection Time: 02/28/18  7:59 AM  Result Value Ref Range Status   Specimen Description BLOOD RIGHT ANTECUBITAL  Final   Special Requests   Final    BOTTLES DRAWN AEROBIC AND ANAEROBIC Blood Culture results may not be optimal due to an excessive volume of blood received in culture bottles   Culture   Final    NO GROWTH 2 DAYS Performed at [facility] [address],  [place], [place] [address]    Report Status PENDING  Incomplete  Culture, blood (routine x 2)     Status: None (Preliminary result)   Collection Time: 02/28/18  7:59 AM  Result Value Ref Range Status   Specimen Description BLOOD RIGHT HAND  Final   Special Requests   Final    BOTTLES DRAWN AEROBIC AND ANAEROBIC Blood Culture adequate volume   Culture   Final    NO GROWTH 2 DAYS Performed at [facility], [place] [address], [place], [place]  [address]    Report Status PENDING  Incomplete     Labs: BNP (last 3 results) No results for input(s): BNP in the last 8760 hours. Basic Metabolic Panel: Recent Labs  Lab 02/27/18 2245 02/28/18 0655 02/28/18 1203 03/01/18 0014 03/02/18 0559  NA 120* 124* 125* 126* 129*  K 3.4* 3.7 3.8 4.1 4.3  CL 82* 87* 86* 89* 92*  CO2 '26 26 28 25 27  '$ GLUCOSE 150* 106* 98 130* 125*  BUN 10 8 7* 6* <5*  CREATININE 1.25* 1.15* 1.14* 1.13* 1.11*  CALCIUM 9.0 8.9 8.8* 8.9 9.0  MG  --   --   --  1.7  --    Liver Function Tests: Recent Labs  Lab 02/27/18 2245  AST 29  ALT 22  ALKPHOS 57  BILITOT 0.6  PROT 7.4  ALBUMIN 2.8*   No results for input(s): LIPASE, AMYLASE in the last 168 hours. No results for input(s): AMMONIA in the last 168 hours. CBC: Recent Labs  Lab 02/27/18 2245 02/28/18 0655 03/01/18 0014 03/01/18 0835  WBC 7.2 7.0 8.6 9.0  NEUTROABS 3.3  --   --   --   HGB 11.2* 10.7* 10.5* 10.6*  HCT 33.5* 32.5* 32.0* 31.8*  MCV 80.7 80.8 81.4 81.5  PLT 239 266 283 260   Cardiac Enzymes: Recent Labs  Lab 02/27/18 2301 02/28/18 0655 02/28/18 1301  TROPONINI <0.03 <0.03 <0.03   BNP: Invalid input(s): POCBNP CBG: Recent Labs  Lab 03/01/18 0805 03/01/18 1156 03/01/18 1645 03/01/18 2049 03/02/18 0743  GLUCAP 114* 162* 146* 151* 123*   D-Dimer No results for input(s): DDIMER in the last 72 hours. Hgb A1c No results for input(s): HGBA1C in the last 72 hours. Lipid Profile No results for input(s): CHOL, HDL, LDLCALC, TRIG,  CHOLHDL, LDLDIRECT in the last 72 hours. Thyroid function studies No results for input(s): TSH, T4TOTAL, T3FREE, THYROIDAB in the last 72 hours.  Invalid input(s): FREET3 Anemia work up No results for input(s): VITAMINB12, FOLATE, FERRITIN, TIBC, IRON, RETICCTPCT in the last 72 hours. Urinalysis    Component Value Date/Time   COLORURINE STRAW (A) 02/28/2018 1150   APPEARANCEUR CLEAR 02/28/2018 1150   LABSPEC 1.005 02/28/2018 1150   PHURINE 7.0 02/28/2018 1150   GLUCOSEU NEGATIVE 02/28/2018 1150   GLUCOSEU NEGATIVE 02/11/2018 0802   HGBUR NEGATIVE 02/28/2018 1150   BILIRUBINUR NEGATIVE 02/28/2018 1150   KETONESUR NEGATIVE 02/28/2018 1150   PROTEINUR NEGATIVE 02/28/2018 1150   UROBILINOGEN 1.0 02/11/2018 0802   NITRITE NEGATIVE 02/28/2018 1150   LEUKOCYTESUR NEGATIVE 02/28/2018 1150   Sepsis Labs Invalid input(s): PROCALCITONIN,  WBC,  LACTICIDVEN Microbiology Recent Results (from the past 240 hour(s))  MYCOBACTERIA, CULTURE, WITH FLUOROCHROME SMEAR     Status: None (Preliminary result)   Collection Time: 02/21/18 10:17 AM  Result Value Ref Range Status   MICRO NUMBER: [id number]  Preliminary   SPECIMEN QUALITY: ADEQUATE  Preliminary   Source: SPUTUM  Preliminary   STATUS: PRELIMINARY  Preliminary   SMEAR: No acid fast bacilli seen.  Preliminary   RESULT:   Preliminary    Culture results to follow. Final reports of negative cultures can be expected in approximately six weeks. Positive cultures are reported immediately.  Respiratory or Resp and Sputum Culture     Status: Abnormal   Collection Time: 02/21/18 10:17 AM  Result Value Ref Range Status   MICRO NUMBER: [id number]  Final   SPECIMEN QUALITY: ADEQUATE  Final   Source SPUTUM  Final   STATUS: FINAL  Final   GRAM STAIN: (A)  Final    No white blood cells seen Few Gram positive cocci in pairs epithelial cells   RESULT: Growth of normal oropharyngeal flora.  Final  Blood culture (routine single)     Status: None  (Preliminary result)   Collection Time: 02/26/18 11:34 AM  Result Value Ref Range Status   MICRO NUMBER: [id number]  Preliminary   SPECIMEN QUALITY: ADEQUATE  Preliminary   Source NOT GIVEN  Preliminary   STATUS: PRELIMINARY  Preliminary   Result:   Preliminary    No growth to date. Culture is examined daily for a total of 7 days incubation. A change in status will result in a phone report followed by an updated printed culture report.   COMMENT:   Preliminary    We received non-standard blood culture bottles with no test indicated. Based upon the specimen submitted, a blood culture test was performed. If this is not what you intended to order, please contact your local client service representative immediately  so that we can adjust our billing appropriately. You may also inquire about alternative or additional testing. Aerobic and anaerobic bottle received.   Culture, blood (routine x 2)     Status: None (Preliminary result)   Collection Time: 02/28/18  7:59 AM  Result Value Ref Range Status   Specimen Description BLOOD RIGHT ANTECUBITAL  Final   Special Requests   Final    BOTTLES DRAWN AEROBIC AND ANAEROBIC Blood Culture results may not be optimal due to an excessive volume of blood received in culture bottles   Culture   Final    NO GROWTH 2 DAYS Performed at [facility] [address], [place], [place] [address]    Report Status PENDING  Incomplete  Culture, blood (routine x 2)     Status: None (Preliminary result)   Collection Time: 02/28/18  7:59 AM  Result Value Ref Range Status   Specimen Description BLOOD RIGHT HAND  Final   Special Requests   Final    BOTTLES DRAWN AEROBIC AND ANAEROBIC Blood Culture adequate volume   Culture   Final    NO GROWTH 2 DAYS Performed at [facility], [place] [address], [place], [place] [address]    Report Status PENDING  Incomplete     Time coordinating discharge: 35 minutes   SIGNED:   Elmarie Shiley, MD  [facility] 03/02/2018, 11:35 AM Pager   If 7PM-7AM, please contact night-coverage [email or web address] Password [email or web address]

## 2018-03-04 ENCOUNTER — Telehealth: Payer: Self-pay | Admitting: Internal Medicine

## 2018-03-04 NOTE — Telephone Encounter (Signed)
Called patient to follow up and make sure is ok since discharge patient stated she is feeling better and will see PCP on 03/05/18.

## 2018-03-05 ENCOUNTER — Ambulatory Visit (INDEPENDENT_AMBULATORY_CARE_PROVIDER_SITE_OTHER): Payer: BLUE CROSS/BLUE SHIELD | Admitting: Internal Medicine

## 2018-03-05 ENCOUNTER — Other Ambulatory Visit (INDEPENDENT_AMBULATORY_CARE_PROVIDER_SITE_OTHER): Payer: BLUE CROSS/BLUE SHIELD

## 2018-03-05 ENCOUNTER — Encounter: Payer: Self-pay | Admitting: Internal Medicine

## 2018-03-05 DIAGNOSIS — I10 Essential (primary) hypertension: Secondary | ICD-10-CM | POA: Diagnosis not present

## 2018-03-05 DIAGNOSIS — R0902 Hypoxemia: Secondary | ICD-10-CM

## 2018-03-05 DIAGNOSIS — I2699 Other pulmonary embolism without acute cor pulmonale: Secondary | ICD-10-CM | POA: Diagnosis not present

## 2018-03-05 LAB — CULTURE, BLOOD (ROUTINE X 2)
Culture: NO GROWTH
Culture: NO GROWTH
Special Requests: ADEQUATE

## 2018-03-05 LAB — CBC WITH DIFFERENTIAL/PLATELET
BASOS ABS: 0 10*3/uL (ref 0.0–0.1)
BASOS PCT: 0.5 % (ref 0.0–3.0)
EOS ABS: 0.2 10*3/uL (ref 0.0–0.7)
Eosinophils Relative: 2.8 % (ref 0.0–5.0)
HEMATOCRIT: 32.1 % — AB (ref 36.0–46.0)
HEMOGLOBIN: 10.8 g/dL — AB (ref 12.0–15.0)
LYMPHS PCT: 25.7 % (ref 12.0–46.0)
Lymphs Abs: 2.1 10*3/uL (ref 0.7–4.0)
MCHC: 33.7 g/dL (ref 30.0–36.0)
MCV: 81 fl (ref 78.0–100.0)
Monocytes Absolute: 1.6 10*3/uL — ABNORMAL HIGH (ref 0.1–1.0)
Monocytes Relative: 19.2 % — ABNORMAL HIGH (ref 3.0–12.0)
NEUTROS ABS: 4.3 10*3/uL (ref 1.4–7.7)
Neutrophils Relative %: 51.8 % (ref 43.0–77.0)
PLATELETS: 348 10*3/uL (ref 150.0–400.0)
RBC: 3.96 Mil/uL (ref 3.87–5.11)
RDW: 15.3 % (ref 11.5–15.5)
WBC: 8.2 10*3/uL (ref 4.0–10.5)

## 2018-03-05 LAB — BASIC METABOLIC PANEL
BUN: 9 mg/dL (ref 6–23)
CHLORIDE: 95 meq/L — AB (ref 96–112)
CO2: 26 meq/L (ref 19–32)
Calcium: 9.5 mg/dL (ref 8.4–10.5)
Creatinine, Ser: 1.03 mg/dL (ref 0.40–1.20)
GFR: 69.21 mL/min (ref >60.00)
Glucose, Bld: 95 mg/dL (ref 70–99)
POTASSIUM: 4.2 meq/L (ref 3.5–5.1)
Sodium: 130 mEq/L — ABNORMAL LOW (ref 135–145)

## 2018-03-05 LAB — CULTURE, BLOOD (SINGLE)
MICRO NUMBER:: 91081625
RESULT: NO GROWTH
SPECIMEN QUALITY: ADEQUATE

## 2018-03-05 LAB — RHEUMATOID FACTOR: Rhuematoid fact SerPl-aCnc: <14 IU/mL (ref <14)

## 2018-03-05 LAB — D-DIMER, QUANTITATIVE (NOT AT ARMC): D DIMER QUANT: 1.39 ug{FEU}/mL — AB (ref <0.50)

## 2018-03-05 LAB — ANA: ANA: NEGATIVE

## 2018-03-05 LAB — ANCA SCREEN W REFLEX TITER

## 2018-03-05 MED ORDER — PROMETHAZINE-CODEINE 6.25-10 MG/5ML PO SYRP: 5.0000 mL | ORAL_SOLUTION | ORAL | 0 refills | Status: DC | PRN

## 2018-03-05 NOTE — Assessment & Plan Note (Signed)
9/19 due to PE O2 Pulm ref

## 2018-03-05 NOTE — Assessment & Plan Note (Signed)
Wt Readings from Last 3 Encounters:  03/05/18 223 lb (101.2 kg)  03/02/18 221 lb 8 oz (100.5 kg)  02/26/18 221 lb (100.2 kg)

## 2018-03-05 NOTE — Progress Notes (Signed)
Subjective:  Patient ID: Sierra Johnson, female    DOB: 09/19/1950  Age: 67 y.o. MRN: [id number]  CC: No chief complaint on file.   HPI Sierra Johnson presents for FUO, PE f/u. No fever in 2 d.  Per hx:  "Recommendations for Outpatient Follow-up:  1. Follow up with PCP in 1-2 weeks 2. Please obtain BMP/CBC in one week 3. Needs referral to hematologist  4. Needs appropriate screening for malignancy for age  42. If fever persist, will need further evaluation 6. Consider start BP medication, would not start Maxzide due to Hyponatremia  Home oxygen   Discharge Condition: stable.  CODE STATUS: full code.  Diet recommendation:  Carb Modified  Brief/Interim Summary: Brief Narrative:Brief NarrativeDenise C Fosteris a 67 y.o.femalewithhistory of diabetes mellitus type 2, hypertension who has been experiencing fever and productive cough for last 1 month and had undergone 2 courses of antibiotics last one was last week last Friday about a week ago and also had one course of prednisone has been expressing exertional shortness of breath for last 4 days. Denies any chest pain. Had gone to her PCP ordered CT angiogram of the chest which showed right upper lobe pulmonary embolismand was admitted for further treatment.   Assessment & Plan:  Principal Problem: Pulmonary embolus, right (HCC) Active Problems: Obstructive sleep apnea Essential hypertension Diabetes mellitus type 2 in obese ([place]) Acute pulmonary embolism (HCC) Hyponatremia  1-Acute right upper lobe PE> ECHO no right side heart strain.  Treated with heparin Gtt. Transition to eliquis.  Doppler negative.  Risk benefit of eliquis discussed with patient.  she will need home oxygen to be use on ambulation.   Hyponatremia;  Agree with holding HCTZ. (Maxzide)  Received IV fluids.  Improved.  121---129  Fever,? Related to PE. Work up for fever unrevealing. No leukocytosis.   DMSSI.    Discharge Diagnoses:  Principal Problem:   Pulmonary embolus, right ([place]) Active Problems:   Obstructive sleep apnea   Essential hypertension   Diabetes mellitus type 2 in obese ([place])   Acute pulmonary embolism ([place])   Hyponatremia"    Outpatient Medications Prior to Visit  Medication Sig Dispense Refill  . albuterol (PROVENTIL HFA;VENTOLIN HFA) 108 (90 Base) MCG/ACT inhaler Inhale 2 puffs into the lungs every 4 (four) hours as needed for wheezing or shortness of breath. Dispense with aerochamber 1 Inhaler 0  . apixaban (ELIQUIS) 5 MG TABS tablet Take 2 tablets (10 mg total) by mouth every 12 (twelve) hours for 7 days. 27 tablet 0  . [START ON 03/08/2018] apixaban (ELIQUIS) 5 MG TABS tablet Take 1 tablet (5 mg total) by mouth every 12 (twelve) hours. 60 tablet 0  . Cholecalciferol (VITAMIN D3) 1000 UNITS CAPS Take 1,000 Units by mouth daily.     [name] docusate sodium (COLACE) 100 MG capsule Take 2 capsules (200 mg total) by mouth 2 (two) times daily. 10 capsule 0  . fish oil-omega-3 fatty acids 1000 MG capsule Take 1 g by mouth 2 (two) times daily.     [name] FREESTYLE INSULINX TEST test strip 1 EACH BY OTHER ROUTE 2 (TWO) TIMES DAILY. USE TO CHECK BLOOD SUGARS TWICE A DAY (Patient taking differently: 1 each by Other route 2 (two) times daily. ) 200 each 1  . glucose monitoring kit (FREESTYLE) monitoring kit 1 each by Does not apply route 2 (two) times daily. Dx: E11.9, I10 1 each 0  . guaiFENesin (ROBITUSSIN) 100 MG/5ML SOLN Take 5 mLs (100 mg total)  by mouth every 4 (four) hours as needed for cough or to loosen phlegm. 1200 mL 0  . Insulin Glargine (TOUJEO SOLOSTAR) 300 UNIT/ML SOPN Inject 18 Units into the skin every morning. 12 pen 1  . Insulin Pen Needle 31G X 5 MM MISC 1 Units by Does not apply route 4 (four) times daily. 50 each 11  . ipratropium-albuterol (DUONEB) 0.5-2.5 (3) MG/3ML SOLN Take 3 mLs by nebulization every 6 (six) hours as needed. (Patient taking differently: Take 3 mLs by  nebulization every 6 (six) hours as needed (for shortness of breath or wheezing). ) 360 mL 1  . Lancets (FREESTYLE) lancets 1 each by Other route 2 (two) times daily. Dx: E11.9, I10 100 each 11  . MAGNESIUM PO Take 1 tablet by mouth 2 (two) times daily.    . metFORMIN (GLUCOPHAGE) 500 MG tablet TAKE 1 TABLET BY MOUTH 3 TIMES DAILY (Patient taking differently: Take 500 mg by mouth 3 (three) times daily. ) 270 tablet 3  . Methylsulfonylmethane (MSM PO) Take 1 tablet by mouth 2 (two) times daily.    . polyethylene glycol (MIRALAX / GLYCOLAX) packet Take 17 g by mouth 2 (two) times daily. 14 each 0  . promethazine-codeine (PHENERGAN WITH CODEINE) 6.25-10 MG/5ML syrup Take 5 mLs by mouth every 4 (four) hours as needed. (Patient taking differently: Take 5 mLs by mouth every 4 (four) hours as needed for cough. ) 300 mL 0  . Spacer/Aero-Holding Chambers (AEROCHAMBER PLUS) inhaler Use as instructed 1 each 2  . vitamin B-12 (CYANOCOBALAMIN) 1000 MCG tablet Take 1,000 mcg by mouth daily.       No facility-administered medications prior to visit.     ROS: Review of Systems  Constitutional: Negative for activity change, appetite change, chills, fatigue, fever and unexpected weight change.  HENT: Negative for congestion, mouth sores and sinus pressure.   Eyes: Negative for visual disturbance.  Respiratory: Positive for cough and shortness of breath. Negative for chest tightness.   Gastrointestinal: Negative for abdominal pain and nausea.  Genitourinary: Negative for difficulty urinating, frequency and vaginal pain.  Musculoskeletal: Negative for back pain and gait problem.  Skin: Negative for pallor and rash.  Neurological: Negative for dizziness, tremors, weakness, numbness and headaches.  Psychiatric/Behavioral: Negative for confusion, sleep disturbance and suicidal ideas.    Objective:  BP 110/72 (BP Location: Left Arm, Patient Position: Sitting, Cuff Size: Large)   Pulse 94   Temp 98 F (36.7 C)  (Oral)   Ht 5' (1.524 m)   Wt 223 lb (101.2 kg)   SpO2 94% Comment: 2 LITERS OF O2  BMI 43.55 kg/m   BP Readings from Last 3 Encounters:  03/05/18 110/72  03/02/18 139/90  02/26/18 104/72    Wt Readings from Last 3 Encounters:  03/05/18 223 lb (101.2 kg)  03/02/18 221 lb 8 oz (100.5 kg)  02/26/18 221 lb (100.2 kg)    Physical Exam  Constitutional: She appears well-developed. No distress.  HENT:  Head: Normocephalic.  Right Ear: External ear normal.  Left Ear: External ear normal.  Nose: Nose normal.  Mouth/Throat: Oropharynx is clear and moist.  Eyes: Pupils are equal, round, and reactive to light. Conjunctivae are normal. Right eye exhibits no discharge. Left eye exhibits no discharge.  Neck: Normal range of motion. Neck supple. No JVD present. No tracheal deviation present. No thyromegaly present.  Cardiovascular: Normal rate, regular rhythm and normal heart sounds.  Pulmonary/Chest: No stridor. No respiratory distress. She has no wheezes.  Abdominal:  Soft. Bowel sounds are normal. She exhibits no distension and no mass. There is no tenderness. There is no rebound and no guarding.  Musculoskeletal: She exhibits no edema or tenderness.  Lymphadenopathy:    She has no cervical adenopathy.  Neurological: She displays normal reflexes. No cranial nerve deficit. She exhibits normal muscle tone. Coordination normal.  Skin: No rash noted. No erythema.  Psychiatric: She has a normal mood and affect. Her behavior is normal. Judgment and thought content normal.  O2 is on  Lab Results  Component Value Date   WBC 9.0 03/01/2018   HGB 10.6 (L) 03/01/2018   HCT 31.8 (L) 03/01/2018   PLT 260 03/01/2018   GLUCOSE 125 (H) 03/02/2018   CHOL 220 (H) 04/27/2017   TRIG 60.0 04/27/2017   HDL 79.80 04/27/2017   LDLDIRECT 119.5 08/27/2012   LDLCALC 128 (H) 04/27/2017   ALT 22 02/27/2018   AST 29 02/27/2018   NA 129 (L) 03/02/2018   K 4.3 03/02/2018   CL 92 (L) 03/02/2018    CREATININE 1.11 (H) 03/02/2018   BUN <5 (L) 03/02/2018   CO2 27 03/02/2018   TSH 1.41 04/27/2017   HGBA1C 6.6 (H) 04/27/2017   MICROALBUR 37.9 (H) 04/27/2017    Ct Angio Chest Pe W Or Wo Contrast  Result Date: 02/27/2018 CLINICAL DATA:  Cough , fever chills and headaches x 4 -6 weeks General abd pai hysterectomy with single oophorectomy Hernia repair with mesh Prev cxr EXAM: CT ANGIOGRAPHY CHEST WITH CONTRAST TECHNIQUE: Multidetector CT imaging of the chest was performed using the standard protocol during bolus administration of intravenous contrast. Multiplanar CT image reconstructions and MIPs were obtained to evaluate the vascular anatomy. CONTRAST:  51m ISOVUE-370 IOPAMIDOL (ISOVUE-370) INJECTION 76% COMPARISON:  None. FINDINGS: Cardiovascular: Heart size normal. No pericardial effusion. Embolus in right upper lobe branch of the pulmonary artery extending into the segmental branches. No left-sided emboli. Fair contrast opacification of the thoracic aorta, with no evidence of dissection, aneurysm, or stenosis. There is classic 3-vessel brachiocephalic arch anatomy without proximal stenosis. No significant atheromatous irregularity. Visualized proximal abdominal aorta unremarkable. Mediastinum/Nodes: No hilar or mediastinal adenopathy. Lungs/Pleura: No pleural effusion. No pneumothorax. Platelike atelectasis laterally in the right lower lobe. Consolidation/atelectasis in posterior and medial basal segments left lower lobe. Upper Abdomen: No acute findings. Musculoskeletal: Anterior vertebral endplate spurring at multiple levels in the mid and lower thoracic spine. No fracture or worrisome bone lesion. Review of the MIP images confirms the above findings. IMPRESSION: 1. Pulmonary embolus in right upper lobe branch of the pulmonary artery extending into the segmental branches. These results will be called to the ordering clinician or representative by the Radiologist Assistant, and communication  documented in the PACS or zVision Dashboard. 2. Bibasilar airspace disease favor atelectasis over consolidation, left worse than right. Electronically Signed   By: DLucrezia EuropeM.D.   On: 02/27/2018 16:53   Ct Abdomen Pelvis W Contrast  Result Date: 02/27/2018 CLINICAL DATA:  Cough , fever chills and headaches x 4 -6 weeks General abd pai hysterectomy with single oophorectomy Hernia repair with mesh Prev cxr EXAM: CT ABDOMEN AND PELVIS WITH CONTRAST TECHNIQUE: Multidetector CT imaging of the abdomen and pelvis was performed using the standard protocol following bolus administration of intravenous contrast. CONTRAST:  744mISOVUE-370 IOPAMIDOL (ISOVUE-370) INJECTION 76% COMPARISON:  09/10/2012 FINDINGS: Lower chest: Bibasilar consolidation/atelectasis left greater than right. No pleural or pericardial effusion. Hepatobiliary: No focal liver abnormality is seen. No gallstones, gallbladder wall thickening, or biliary dilatation.  Pancreas: Unremarkable. No pancreatic ductal dilatation or surrounding inflammatory changes. Spleen: Normal in size without focal abnormality. Adrenals/Urinary Tract: Normal adrenals. Normal right kidney. 4.1 cm mid left renal cyst. No hydronephrosis. Urinary bladder is nondistended. Stomach/Bowel: Stomach is incompletely distended with ingested material. Small bowel nondilated. Normal appendix. Moderate proximal colonic fecal material without dilatation. Distal colon decompressed, unremarkable. Vascular/Lymphatic: Mild scattered aortoiliac atheromatous calcifications without aneurysm or stenosis. No abdominal or pelvic adenopathy. Portal vein patent. Reproductive: Status post hysterectomy. No adnexal masses. Other: No ascites.  No free air. Musculoskeletal: Interval ventral hernia mesh repair. Spondylitic changes in the lower lumbar spine. No fracture or worrisome bone lesion. IMPRESSION: 1.  Bilateral lower lobe airspace disease,Left greater than right. 2. No acute abdominal process. 3.  Postop and degenerative changes as above. Electronically Signed   By: Lucrezia Europe M.D.   On: 02/27/2018 16:58    Assessment & Plan:   There are no diagnoses linked to this encounter.   No orders of the defined types were placed in this encounter.    Follow-up: No follow-ups on file.  Walker Kehr, MD

## 2018-03-05 NOTE — Assessment & Plan Note (Signed)
On Eliquis O2 Pulm ref Hem ref

## 2018-03-08 NOTE — Telephone Encounter (Signed)
Forms have been signed, Faxed to [facility] @ [phone], Copy sent to scan.

## 2018-03-18 ENCOUNTER — Encounter: Payer: Self-pay | Admitting: Pulmonary Disease

## 2018-03-18 ENCOUNTER — Other Ambulatory Visit: Payer: Self-pay | Admitting: Internal Medicine

## 2018-03-18 ENCOUNTER — Ambulatory Visit (INDEPENDENT_AMBULATORY_CARE_PROVIDER_SITE_OTHER)
Admission: RE | Admit: 2018-03-18 | Discharge: 2018-03-18 | Disposition: A | Discharge status: Home / Self Care | Payer: BLUE CROSS/BLUE SHIELD | Source: Ambulatory Visit | Attending: Pulmonary Disease | Admitting: Pulmonary Disease

## 2018-03-18 ENCOUNTER — Ambulatory Visit (INDEPENDENT_AMBULATORY_CARE_PROVIDER_SITE_OTHER): Payer: BLUE CROSS/BLUE SHIELD | Admitting: Pulmonary Disease

## 2018-03-18 VITALS — BP 118/76 | HR 77 | Ht 60.0 in | Wt 223.0 lb

## 2018-03-18 DIAGNOSIS — R0602 Shortness of breath: Secondary | ICD-10-CM

## 2018-03-18 DIAGNOSIS — I2699 Other pulmonary embolism without acute cor pulmonale: Secondary | ICD-10-CM

## 2018-03-18 DIAGNOSIS — R0902 Hypoxemia: Secondary | ICD-10-CM

## 2018-03-18 NOTE — Assessment & Plan Note (Signed)
Does not desaturate anymore, can discontinue oxygen  Obtain chest x-ray today to follow-up on bibasilar consolidation versus atelectasis, not seen on prior chest x-rays

## 2018-03-18 NOTE — Patient Instructions (Signed)
Ambulatory saturation check.  Stay on Eliquis 5 mg twice daily for 6 months -we will reassess need for this then.  Chest x-ray today  Okay to travel on a cruise

## 2018-03-18 NOTE — Assessment & Plan Note (Signed)
Appears unprovoked, duplex was negative. I personally reviewed images Stay on Eliquis 5 mg twice daily for 6 months -we will reassess need for this then.  Okay to travel on a cruise in November.  I explained simple precautions

## 2018-03-18 NOTE — Progress Notes (Signed)
   Subjective:    Patient ID: Sierra Johnson, female    DOB: 06/19/1951, 67 y.o.   MRN: [id number]  HPI  66/F who I was following for OSA, presents after hospital visit for pneumonia and pulmonary embolism  Chief Complaint  Patient presents with  . Follow-up    f/u for OSA. Uses [facility] as her DME. Denies any breathing issues.    She developed cough with sputum production and fevers.  She was treated by an antibiotic by her PCP.  I have personally reviewed her imaging studies, chest x-ray on 8/19 and 03/16/2023 did not show any evidence of infiltrate.  CT sinuses 01/30/2018 did show fluid in the sphenoid and maxillary sinuses. She was hospitalized 9/11 and CT angiogram chest showed pulmonary embolism in the right upper lobe.  There was also bibasilar airspace disease and radiologist favored atelectasis or consolidation left worse than right. Duplex was surprisingly negative for DVT. Echo did not show any evidence of pulmonary hypertension.  She was treated with IV heparin initially and then transition to Eliquis which she has been compliant with since discharge.  She was provided with oxygen and discharge She reports a persistent cough, dyspnea is improved.  She has chest pain on her sides which she attributes to coughing There is no family history of VTE  Labs -Mild leukocytosis on admit which is resolved by discharge  On ambulation oxygen saturation stayed at 96% although heart rate increased from 89-1 36 on 2 laps  Past Medical History:  Diagnosis Date  . ANEMIA-NOS 04/24/2007  . DIABETES MELLITUS, TYPE II 04/24/2007  . Elevated glucose 2011  . HYPERLIPIDEMIA 07/17/2007   mild  . HYPERTENSION 04/24/2007  . Menopause 1998   Dr. Gaetano Net  . OBSTRUCTIVE SLEEP APNEA 09/30/2009   on CPAP 2011  . VITAMIN B12 DEFICIENCY 04/24/2007       Review of Systems neg for any significant sore throat, dysphagia, itching, sneezing, nasal congestion or excess/ purulent secretions, fever, chills, sweats,  unintended wt loss, pleuritic or exertional cp, hempoptysis, orthopnea pnd or change in chronic leg swelling. Also denies presyncope, palpitations, heartburn, abdominal pain, nausea, vomiting, diarrhea or change in bowel or urinary habits, dysuria,hematuria, rash, arthralgias, visual complaints, headache, numbness weakness or ataxia.     Objective:   Physical Exam  Gen. Pleasant, obese, in no distress ENT - no lesions, no post nasal drip Neck: No JVD, no thyromegaly, no carotid bruits Lungs: no use of accessory muscles, no dullness to percussion, decreased without rales or rhonchi  Cardiovascular: Rhythm regular, heart sounds  normal, no murmurs or gallops, no peripheral edema Musculoskeletal: No deformities, no cyanosis or clubbing , no tremors        Assessment & Plan:

## 2018-03-19 ENCOUNTER — Other Ambulatory Visit: Payer: Self-pay

## 2018-03-19 DIAGNOSIS — J181 Lobar pneumonia, unspecified organism: Principal | ICD-10-CM

## 2018-03-19 DIAGNOSIS — J189 Pneumonia, unspecified organism: Secondary | ICD-10-CM

## 2018-03-19 MED ORDER — LEVOFLOXACIN 500 MG PO TABS: 500.0000 mg | ORAL_TABLET | Freq: Every day | ORAL | 0 refills | Status: DC

## 2018-03-25 ENCOUNTER — Other Ambulatory Visit: Payer: Self-pay | Admitting: Hematology

## 2018-03-25 DIAGNOSIS — D649 Anemia, unspecified: Secondary | ICD-10-CM

## 2018-03-25 DIAGNOSIS — I2699 Other pulmonary embolism without acute cor pulmonale: Secondary | ICD-10-CM

## 2018-03-25 NOTE — Assessment & Plan Note (Addendum)
The patient denies recent history of bleeding such as epistaxis, hematuria, hematochezia or melena. She is asymptomatic from the anemia.  Iron studies pending.  She does not require transfusion now.  Her last colonoscopy was in 2008. Given that it's been over 10 years since last colonoscopy, I recommended to pt to follow up with her PCP re: repeat colonoscopy.

## 2018-03-25 NOTE — Telephone Encounter (Signed)
Dr. Alva please advise. Thanks. 

## 2018-03-25 NOTE — Assessment & Plan Note (Addendum)
I reviewed the patient recent hospital records.  I also independently reviewed the radiologic studies, including CT angiogram, and agree with the findings as documented below.  I discussed with the patient about the plan for care for recent acute symptomatic PTE.  This episode of blood clot appeared to be unprovoked. We discussed about the pros and cons about testing for thrombophilia disorder. Her current anticoagulation therapy will interfere with some the tests and it is not possible to interpret the test results. Taking her off the anticoagulation therapy to do the tests may precipitate another thrombotic event. I do not see a reason to order additional testing to screen for thrombophilia disorder as it would not change our management. In the absence of contraindications for anticoagulation, the goal of anticoagulation therapy is for lifelong.   Patient was discharged from the hospital on Eliquis. She has tolerated it well and we will continue the current regimen. After she completes 6 months of therapeutic dose, we can consider reducing the dose by half for secondary prophylaxis.    Refill for Eliquis 5mg  BID x 90 days w/ 1 refill was given to the patient today.   I recommend the patient to use elastic compression stockings at 20-30 mmHg to reduce risks of chronic thrombophlebitis.  Finally, at the end of our consultation today, I reinforced the importance of preventive strategies such as avoiding hormonal supplement, avoiding cigarette smoking, keeping up-to-date with screening programs for early cancer detection, frequent ambulation for long distance travel and aggressive DVT prophylaxis in all surgical settings.  Should she need any interruption of the anticoagulation for elective procedures in the future, feel free to contact me regarding peri-operative management.

## 2018-03-25 NOTE — Assessment & Plan Note (Deleted)
I reviewed with the patient about the plan for care for recent acute symptomatic PTE.  This last episode of blood clot appeared to be unprovoked. We discussed about the pros and cons about testing for thrombophilia disorder. Her current anticoagulation therapy will interfere with some the tests and it is not possible to interpret the test results. Taking her off the anticoagulation therapy to do the tests may precipitate another thrombotic event. I do not see a reason to order additional testing to screen for thrombophilia disorder as it would not change our management. In the absence of contraindications for anticoagulation, the goal of anticoagulation therapy is for lifelong.   Patient was discharged from the hospital on Eliquis. She has tolerated it well and we will continue the current regimen. After she completes 6 months of therapeutic dose, we can consider reducing the dose by half for secondary prophylaxis.   I recommend the patient to use elastic compression stockings at 20-30 mmHg to reduce risks of chronic thrombophlebitis.  Finally, at the end of our consultation today, I reinforced the importance of preventive strategies such as avoiding hormonal supplement, avoiding cigarette smoking, keeping up-to-date with screening programs for early cancer detection, frequent ambulation for long distance travel and aggressive DVT prophylaxis in all surgical settings.  Should she need any interruption of the anticoagulation for elective procedures in the future, feel free to contact me regarding peri-operative management.

## 2018-03-26 ENCOUNTER — Inpatient Hospital Stay: Payer: BLUE CROSS/BLUE SHIELD | Attending: Hematology | Admitting: Hematology

## 2018-03-26 ENCOUNTER — Other Ambulatory Visit: Payer: Self-pay

## 2018-03-26 ENCOUNTER — Encounter: Payer: Self-pay | Admitting: Hematology

## 2018-03-26 ENCOUNTER — Inpatient Hospital Stay: Payer: BLUE CROSS/BLUE SHIELD

## 2018-03-26 VITALS — BP 134/81 | HR 79 | Temp 98.3°F | Resp 18 | Wt 221.5 lb

## 2018-03-26 DIAGNOSIS — Z7901 Long term (current) use of anticoagulants: Secondary | ICD-10-CM | POA: Diagnosis not present

## 2018-03-26 DIAGNOSIS — E119 Type 2 diabetes mellitus without complications: Secondary | ICD-10-CM

## 2018-03-26 DIAGNOSIS — Z794 Long term (current) use of insulin: Secondary | ICD-10-CM | POA: Insufficient documentation

## 2018-03-26 DIAGNOSIS — D649 Anemia, unspecified: Secondary | ICD-10-CM

## 2018-03-26 DIAGNOSIS — I2699 Other pulmonary embolism without acute cor pulmonale: Secondary | ICD-10-CM | POA: Insufficient documentation

## 2018-03-26 DIAGNOSIS — I10 Essential (primary) hypertension: Secondary | ICD-10-CM | POA: Insufficient documentation

## 2018-03-26 DIAGNOSIS — Z79899 Other long term (current) drug therapy: Secondary | ICD-10-CM | POA: Diagnosis not present

## 2018-03-26 LAB — CBC WITH DIFFERENTIAL (CANCER CENTER ONLY)
Basophils Absolute: 0 10*3/uL (ref 0.0–0.1)
Basophils Relative: 0 %
Eosinophils Absolute: 0.2 10*3/uL (ref 0.0–0.5)
Eosinophils Relative: 2 %
HEMATOCRIT: 36.2 % (ref 34.8–46.6)
Hemoglobin: 11.6 g/dL (ref 11.6–15.9)
Lymphocytes Relative: 28 %
Lymphs Abs: 2.4 10*3/uL (ref 0.9–3.3)
MCH: 27.2 pg (ref 26.0–34.0)
MCHC: 32 g/dL (ref 32.0–36.0)
MCV: 85 fL (ref 81.0–101.0)
MONO ABS: 0.8 10*3/uL (ref 0.1–0.9)
MONOS PCT: 9 %
Neutro Abs: 5.2 10*3/uL (ref 1.5–6.5)
Neutrophils Relative %: 61 %
Platelet Count: 357 10*3/uL (ref 145–400)
RBC: 4.26 MIL/uL (ref 3.70–5.32)
RDW: 16.5 % — AB (ref 11.1–15.7)
WBC Count: 8.5 10*3/uL (ref 3.9–10.0)

## 2018-03-26 LAB — CMP (CANCER CENTER ONLY)
ALT: 30 U/L (ref 0–44)
ANION GAP: 9 (ref 5–15)
AST: 34 U/L (ref 15–41)
Albumin: 3.3 g/dL — ABNORMAL LOW (ref 3.5–5.0)
Alkaline Phosphatase: 82 U/L (ref 38–126)
BILIRUBIN TOTAL: 0.5 mg/dL (ref 0.3–1.2)
BUN: 9 mg/dL (ref 8–23)
CO2: 27 mmol/L (ref 22–32)
CREATININE: 1.08 mg/dL — AB (ref 0.44–1.00)
Calcium: 10.2 mg/dL (ref 8.9–10.3)
Chloride: 103 mmol/L (ref 98–111)
GFR, EST NON AFRICAN AMERICAN: 53 mL/min — AB (ref >60)
Glucose, Bld: 108 mg/dL — ABNORMAL HIGH (ref 70–99)
POTASSIUM: 4 mmol/L (ref 3.5–5.1)
Sodium: 139 mmol/L (ref 135–145)
Total Protein: 8.7 g/dL — ABNORMAL HIGH (ref 6.5–8.1)

## 2018-03-26 LAB — FERRITIN: FERRITIN: 82 ng/mL (ref 11–307)

## 2018-03-26 LAB — IRON AND TIBC
Iron: 57 ug/dL (ref 41–142)
Saturation Ratios: 19 % — ABNORMAL LOW (ref 21–57)
TIBC: 296 ug/dL (ref 236–444)
UIBC: 240 ug/dL

## 2018-03-26 LAB — SAVE SMEAR

## 2018-03-26 MED ORDER — APIXABAN 5 MG PO TABS: 5.0000 mg | ORAL_TABLET | Freq: Two times a day (BID) | ORAL | 1 refills | Status: DC

## 2018-03-26 NOTE — Progress Notes (Signed)
Sierra Johnson  Patient Care Team: Plotnikov, [name], MD as PCP - General  ASSESSMENT & PLAN:  Normocytic anemia The patient denies recent history of bleeding such as epistaxis, hematuria, hematochezia or melena. She is asymptomatic from the anemia.  Iron studies pending.  She does not require transfusion now.  Her last colonoscopy was in 2008. Given that it's been over 10 years since last colonoscopy, I recommended to pt to follow up with her PCP re: repeat colonoscopy.   Acute pulmonary embolism ([place]) I reviewed the patient recent hospital records.  I also independently reviewed the radiologic studies, including CT angiogram, and agree with the findings as documented below.  I discussed with the patient about the plan for care for recent acute symptomatic PTE.  This episode of blood clot appeared to be unprovoked. We discussed about the pros and cons about testing for thrombophilia disorder. Her current anticoagulation therapy will interfere with some the tests and it is not possible to interpret the test results. Taking her off the anticoagulation therapy to do the tests may precipitate another thrombotic event. I do not see a reason to order additional testing to screen for thrombophilia disorder as it would not change our management. In the absence of contraindications for anticoagulation, the goal of anticoagulation therapy is for lifelong.   Patient was discharged from the hospital on Eliquis. She has tolerated it well and we will continue the current regimen. After she completes 6 months of therapeutic dose, we can consider reducing the dose by half for secondary prophylaxis.    Refill for Eliquis 41m BID x 90 days w/ 1 refill was given to the patient today.   I recommend the patient to use elastic compression stockings at 20-30 mmHg to reduce risks of chronic thrombophlebitis.  Finally, at the end of our consultation today, I reinforced the importance of  preventive strategies such as avoiding hormonal supplement, avoiding cigarette smoking, keeping up-to-date with screening programs for early cancer detection, frequent ambulation for long distance travel and aggressive DVT prophylaxis in all surgical settings.  Should she need any interruption of the anticoagulation for elective procedures in the future, feel free to contact me regarding peri-operative management.   Orders Placed This Encounter  Procedures  . CBC with Differential (Cancer Center Only)    Standing Status:   Future    Standing Expiration Date:   04/30/2019  . [place]    Standing Status:   Future    Standing Expiration Date:   04/30/2019    A total of more than 45 minutes were spent face-to-face with the patient during this encounter and over half of that time was spent on counseling and coordination of care as outlined above.    All questions were answered. The patient knows to call the clinic with any problems, questions or concerns.  Return to clinic in 3 months for labs and anticoagulation monitoring.   YTish Men MD 03/26/2018 10:49 AM   CHIEF COMPLAINTS/PURPOSE OF CONSULTATION:  "I am here for my blood clot"  HISTORY OF PRESENTING ILLNESS:  [name]89y.o. female is here because of newly diagnosed PTE of the right upper lobe of the lung.  Patient reports that starting in early August 2019, she began to develop new onset cough and sinus symptoms, associated with low-grade fever, for which she was prescribed 3 different courses of antibiotics without improvement.  In early September 2019, she also developed new onset shortness of  breath as well as chest wall soreness from repetitive coughing.  She did not have any lower extremity swelling, warmth, tenderness or erythema.  Her PCP ordered a CT angiogram of the chest, which showed a right upper lobe PTE, for which she was sent to the ER for further management.  While in the hospital, patient was  initially started on heparin drip, and the transition to Eliquis at discharge.  She denied any extended travel, being on hormone replacement therapy, or hospitalization/surgery prior to her diagnosis of VTE.  There is no personal or family history of malignancy.  Patient's mother had a blood clot after hip surgery, but there is no other family member with history of VTE.  Patient reports that she still has mild intermittent chest wall soreness from coughing, but denies any fever, chills, night sweats, weight loss, chest pain, dyspnea, hemoptysis, epistaxis, hematochezia, melena, or hematuria.  She is up-to-date with her yearly mammogram.  Her last colonoscopy was in 2008, which did not show any abnormal findings.  She has not been scheduled for the next colonoscopy.  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  MEDICAL HISTORY:  Past Medical History:  Diagnosis Date  . ANEMIA-NOS 04/24/2007  . DIABETES MELLITUS, TYPE II 04/24/2007  . Elevated glucose 2011  . HYPERLIPIDEMIA 07/17/2007   mild  . HYPERTENSION 04/24/2007  . Menopause 1998   Dr. Gaetano Net  . OBSTRUCTIVE SLEEP APNEA 09/30/2009   on CPAP 2011  . VITAMIN B12 DEFICIENCY 04/24/2007    SURGICAL HISTORY: Past Surgical History:  Procedure Laterality Date  . ABDOMINAL HYSTERECTOMY    . HERNIA REPAIR  11/11   Incar. midline hernia  . INCISIONAL HERNIA REPAIR N/A 06/03/2014   Procedure: LAPAROSCOPIC REPAIR RECURRENT  INCISIONAL HERNIA;  Surgeon: Georganna Skeans, MD;  Location: [facility];  Service: General;  Laterality: N/A;  . INSERTION OF MESH N/A 06/03/2014   Procedure: INSERTION OF MESH;  Surgeon: Georganna Skeans, MD;  Location: [facility];  Service: General;  Laterality: N/A;    SOCIAL HISTORY: Social History   Socioeconomic History  . Marital status: Married    Spouse name: Not on file  . Number of children: 2  . Years of education: Not on file  . Highest education level: Not on file  Occupational History  . Occupation: Print production planner: [employer]  Social Needs  . Financial resource strain: Not on file  . Food insecurity:    Worry: Not on file    Inability: Not on file  . Transportation needs:    Medical: Not on file    Non-medical: Not on file  Tobacco Use  . Smoking status: Never Smoker  . Smokeless tobacco: Never Used  Substance and Sexual Activity  . Alcohol use: No  . Drug use: No  . Sexual activity: Yes  Lifestyle  . Physical activity:    Days per week: Not on file    Minutes per session: Not on file  . Stress: Not on file  Relationships  . Social connections:    Talks on phone: Not on file    Gets together: Not on file    Attends religious service: Not on file    Active member of club or organization: Not on file    Attends meetings of clubs or organizations: Not on file    Relationship status: Not on file  . Intimate partner violence:    Fear of current or ex partner: Not on file  Emotionally abused: Not on file    Physically abused: Not on file    Forced sexual activity: Not on file  Other Topics Concern  . Not on file  Social History Narrative   Vegan    FAMILY HISTORY: Family History  Problem Relation Age of Onset  . Heart disease Mother 29       ?CAD  . Cancer Father        lung  . Heart disease Brother 58       CAD  . Hypertension Other     ALLERGIES:  is allergic to amlodipine; benazepril; and tetracycline.  MEDICATIONS:  Current Outpatient Medications  Medication Sig Dispense Refill  . albuterol (PROVENTIL HFA;VENTOLIN HFA) 108 (90 Base) MCG/ACT inhaler Inhale 2 puffs into the lungs every 4 (four) hours as needed for wheezing or shortness of breath. Dispense with aerochamber 1 Inhaler 0  . apixaban (ELIQUIS) 5 MG TABS tablet Take 1 tablet (5 mg total) by mouth 2 (two) times daily. 180 tablet 1  . Cholecalciferol (VITAMIN D3) 1000 UNITS CAPS Take 1,000 Units by mouth daily.     [name] docusate sodium (COLACE) 100 MG capsule Take 2 capsules (200 mg total) by  mouth 2 (two) times daily. 10 capsule 0  . fish oil-omega-3 fatty acids 1000 MG capsule Take 1 g by mouth 2 (two) times daily.     [name] FREESTYLE INSULINX TEST test strip 1 EACH BY OTHER ROUTE 2 (TWO) TIMES DAILY. USE TO CHECK BLOOD SUGARS TWICE A DAY (Patient taking differently: 1 each by Other route 2 (two) times daily. ) 200 each 1  . FREESTYLE INSULINX TEST test strip 1 EACH BY OTHER ROUTE 2 (TWO) TIMES DAILY. USE TO CHECK BLOOD SUGARS TWICE A DAY 200 each 1  . glucose monitoring kit (FREESTYLE) monitoring kit 1 each by Does not apply route 2 (two) times daily. Dx: E11.9, I10 1 each 0  . guaiFENesin (ROBITUSSIN) 100 MG/5ML SOLN Take 5 mLs (100 mg total) by mouth every 4 (four) hours as needed for cough or to loosen phlegm. 1200 mL 0  . Insulin Glargine (TOUJEO SOLOSTAR) 300 UNIT/ML SOPN Inject 18 Units into the skin every morning. 12 pen 1  . Insulin Pen Needle 31G X 5 MM MISC 1 Units by Does not apply route 4 (four) times daily. 50 each 11  . ipratropium-albuterol (DUONEB) 0.5-2.5 (3) MG/3ML SOLN Take 3 mLs by nebulization every 6 (six) hours as needed. (Patient taking differently: Take 3 mLs by nebulization every 6 (six) hours as needed (for shortness of breath or wheezing). ) 360 mL 1  . Lancets (FREESTYLE) lancets 1 each by Other route 2 (two) times daily. Dx: E11.9, I10 100 each 11  . levofloxacin (LEVAQUIN) 500 MG tablet Take 1 tablet (500 mg total) by mouth daily. 5 tablet 0  . MAGNESIUM PO Take 1 tablet by mouth 2 (two) times daily.    . metFORMIN (GLUCOPHAGE) 500 MG tablet TAKE 1 TABLET BY MOUTH 3 TIMES DAILY (Patient taking differently: Take 500 mg by mouth 3 (three) times daily. ) 270 tablet 3  . Methylsulfonylmethane (MSM PO) Take 1 tablet by mouth 2 (two) times daily.    . polyethylene glycol (MIRALAX / GLYCOLAX) packet Take 17 g by mouth 2 (two) times daily. 14 each 0  . promethazine-codeine (PHENERGAN WITH CODEINE) 6.25-10 MG/5ML syrup Take 5 mLs by mouth every 4 (four) hours as needed  for cough. 300 mL 0  . Spacer/Aero-Holding Chambers (AEROCHAMBER PLUS) inhaler  Use as instructed 1 each 2  . vitamin B-12 (CYANOCOBALAMIN) 1000 MCG tablet Take 1,000 mcg by mouth daily.       No current facility-administered medications for this visit.     REVIEW OF SYSTEMS:   Constitutional: ( - ) fevers, ( - )  chills , ( - ) night sweats Eyes: ( - ) blurriness of vision, ( - ) double vision, ( - ) watery eyes Ears, nose, mouth, throat, and face: ( - ) mucositis, ( - ) sore throat Respiratory: ( + ) cough, ( - ) dyspnea, ( - ) wheezes Cardiovascular: ( - ) palpitation, ( - ) lower extremity swelling Gastrointestinal:  ( - ) nausea, ( - ) heartburn, ( - ) change in bowel habits Skin: ( - ) abnormal skin rashes Lymphatics: ( - ) new lymphadenopathy, ( - ) easy bruising Neurological: ( - ) numbness, ( - ) tingling, ( - ) new weaknesses Behavioral/Psych: ( - ) mood change, ( - ) new changes  All other systems were reviewed with the patient and are negative.  PHYSICAL EXAMINATION: ECOG PERFORMANCE STATUS: 1 - Symptomatic but completely ambulatory  Vitals:   03/26/18 1000  BP: 134/81  Pulse: 79  Resp: 18  Temp: 98.3 F (36.8 C)  SpO2: 99%   Filed Weights   03/26/18 1000  Weight: 221 lb 8 oz (100.5 kg)    GENERAL: alert, no distress and comfortable SKIN: skin color, texture, turgor are normal, no rashes or significant lesions EYES: conjunctiva are pink and non-injected, sclera clear OROPHARYNX: no exudate, no erythema; lips, buccal mucosa, and tongue normal  NECK: supple, non-tender LYMPH:  no palpable lymphadenopathy in the cervical or axillary  LUNGS: clear to auscultation and percussion with normal breathing effort HEART: regular rate & rhythm and no murmurs and no lower extremity edema ABDOMEN: soft, non-tender, non-distended, normal bowel sounds Musculoskeletal: no cyanosis of digits and no clubbing  PSYCH: alert & oriented x 3, fluent speech NEURO: no focal  motor/sensory deficits  LABORATORY DATA:  I have reviewed the data as listed Lab Results  Component Value Date   WBC 8.5 03/26/2018   HGB 11.6 03/26/2018   HCT 36.2 03/26/2018   MCV 85.0 03/26/2018   PLT 357 03/26/2018   Lab Results  Component Value Date   NA 130 (L) 03/05/2018   K 4.2 03/05/2018   CL 95 (L) 03/05/2018   CO2 26 03/05/2018   I personally reviewed the patient's peripheral blood smear today.  There was no peripheral blast.  The white blood cells were of normal morphology.  The red blood cells were normocytic/borderline microcytic, with mild scattered hypochromia.  There was no schistocytosis.  The platelets are of normal size and I have verified that there were no platelet clumping.  RADIOGRAPHIC STUDIES: I have personally reviewed the radiological images as listed and agreed with the findings in the report. Dg Chest 2 View  Result Date: 03/19/2018 CLINICAL DATA:  Cough and shortness of breath for the past 2 months. History of diabetes and hypertension. Previous pulmonary embolism. Nonsmoker. EXAM: CHEST - 2 VIEW COMPARISON:  CT scan of the chest of February 27, 2018 and chest x-ray of February 15, 2018 FINDINGS: There is dense infiltrate in the left lower lobe which is new. The left upper lobe and the right lung are clear. The heart is normal in size. The pulmonary vascularity is not engorged. There is calcification in the wall of the aortic arch. There is multilevel degenerative  disc disease of the thoracic spine. IMPRESSION: Increased density in the left retrocardiac region consistent with pneumonia similar to that seen on the CT scan of approximately 3 weeks ago. No CHF. Followup PA and lateral chest X-ray is recommended in 3-4 weeks following trial of antibiotic therapy to ensure resolution and exclude underlying malignancy. Thoracic aortic atherosclerosis. Electronically Signed   By: David  Martinique M.D.   On: 03/19/2018 07:59   Ct Angio Chest Pe W Or Wo Contrast  Result  Date: 02/27/2018 CLINICAL DATA:  Cough , fever chills and headaches x 4 -6 weeks General abd pai hysterectomy with single oophorectomy Hernia repair with mesh Prev cxr EXAM: CT ANGIOGRAPHY CHEST WITH CONTRAST TECHNIQUE: Multidetector CT imaging of the chest was performed using the standard protocol during bolus administration of intravenous contrast. Multiplanar CT image reconstructions and MIPs were obtained to evaluate the vascular anatomy. CONTRAST:  14m ISOVUE-370 IOPAMIDOL (ISOVUE-370) INJECTION 76% COMPARISON:  None. FINDINGS: Cardiovascular: Heart size normal. No pericardial effusion. Embolus in right upper lobe branch of the pulmonary artery extending into the segmental branches. No left-sided emboli. Fair contrast opacification of the thoracic aorta, with no evidence of dissection, aneurysm, or stenosis. There is classic 3-vessel brachiocephalic arch anatomy without proximal stenosis. No significant atheromatous irregularity. Visualized proximal abdominal aorta unremarkable. Mediastinum/Nodes: No hilar or mediastinal adenopathy. Lungs/Pleura: No pleural effusion. No pneumothorax. Platelike atelectasis laterally in the right lower lobe. Consolidation/atelectasis in posterior and medial basal segments left lower lobe. Upper Abdomen: No acute findings. Musculoskeletal: Anterior vertebral endplate spurring at multiple levels in the mid and lower thoracic spine. No fracture or worrisome bone lesion. Review of the MIP images confirms the above findings. IMPRESSION: 1. Pulmonary embolus in right upper lobe branch of the pulmonary artery extending into the segmental branches. These results will be called to the ordering clinician or representative by the Radiologist Assistant, and communication documented in the PACS or zVision Dashboard. 2. Bibasilar airspace disease favor atelectasis over consolidation, left worse than right. Electronically Signed   By: DLucrezia EuropeM.D.   On: 02/27/2018 16:53   Ct Abdomen  Pelvis W Contrast  Result Date: 02/27/2018 CLINICAL DATA:  Cough , fever chills and headaches x 4 -6 weeks General abd pai hysterectomy with single oophorectomy Hernia repair with mesh Prev cxr EXAM: CT ABDOMEN AND PELVIS WITH CONTRAST TECHNIQUE: Multidetector CT imaging of the abdomen and pelvis was performed using the standard protocol following bolus administration of intravenous contrast. CONTRAST:  712mISOVUE-370 IOPAMIDOL (ISOVUE-370) INJECTION 76% COMPARISON:  09/10/2012 FINDINGS: Lower chest: Bibasilar consolidation/atelectasis left greater than right. No pleural or pericardial effusion. Hepatobiliary: No focal liver abnormality is seen. No gallstones, gallbladder wall thickening, or biliary dilatation. Pancreas: Unremarkable. No pancreatic ductal dilatation or surrounding inflammatory changes. Spleen: Normal in size without focal abnormality. Adrenals/Urinary Tract: Normal adrenals. Normal right kidney. 4.1 cm mid left renal cyst. No hydronephrosis. Urinary bladder is nondistended. Stomach/Bowel: Stomach is incompletely distended with ingested material. Small bowel nondilated. Normal appendix. Moderate proximal colonic fecal material without dilatation. Distal colon decompressed, unremarkable. Vascular/Lymphatic: Mild scattered aortoiliac atheromatous calcifications without aneurysm or stenosis. No abdominal or pelvic adenopathy. Portal vein patent. Reproductive: Status post hysterectomy. No adnexal masses. Other: No ascites.  No free air. Musculoskeletal: Interval ventral hernia mesh repair. Spondylitic changes in the lower lumbar spine. No fracture or worrisome bone lesion. IMPRESSION: 1.  Bilateral lower lobe airspace disease,Left greater than right. 2. No acute abdominal process. 3. Postop and degenerative changes as above. Electronically Signed   By:  Lucrezia Europe M.D.   On: 02/27/2018 16:58

## 2018-03-26 NOTE — Telephone Encounter (Signed)
Okay to give letter stating that she is under care for pulmonary embolism and cannot undertake a long flight due to medical reasons

## 2018-04-02 ENCOUNTER — Other Ambulatory Visit: Payer: Self-pay

## 2018-04-02 DIAGNOSIS — J181 Lobar pneumonia, unspecified organism: Principal | ICD-10-CM

## 2018-04-02 DIAGNOSIS — J189 Pneumonia, unspecified organism: Secondary | ICD-10-CM

## 2018-04-02 NOTE — Progress Notes (Signed)
Order to discontinue oxygen.

## 2018-04-02 NOTE — Telephone Encounter (Signed)
Assessment & Plan Note by [name], MD at 03/18/2018 5:51 PM  Author: [name], MD Author Type: Physician Filed: 03/18/2018 5:52 PM  Note Status: Written Cosign: Cosign Not Required Encounter Date: 03/18/2018  Problem: Hypoxemia  Editor: [name], MD (Physician)    Does not desaturate anymore, can discontinue oxygen  Obtain chest x-ray today to follow-up on bibasilar consolidation versus atelectasis, not seen on prior chest x-rays    Assessment & Plan Note by [name], MD at 03/18/2018 5:50 PM  Author: [name], MD Author Type: Physician Filed: 03/18/2018 5:51 PM  Note Status: Written Cosign: Cosign Not Required Encounter Date: 03/18/2018  Problem: Pulmonary embolus, right [facility])  Editor: [name], MD (Physician)    Appears unprovoked, duplex was negative. I personally reviewed images Stay on Eliquis 5 mg twice daily for 6 months -we will reassess need for this then.  Okay to travel on a cruise in November.  I explained simple precautions    Patient Instructions by [name], MD at 03/18/2018 3:45 PM  Author: [name], MD Author Type: Physician Filed: 03/18/2018 4:48 PM  Note Status: Signed Cosign: Cosign Not Required Encounter Date: 03/18/2018  Editor: [name], MD (Physician)     Ambulatory saturation check.  Stay on Eliquis 5 mg twice daily for 6 months -we will reassess need for this then.  Chest x-ray today  Okay to travel on a cruise

## 2018-04-03 ENCOUNTER — Telehealth: Payer: Self-pay | Admitting: Pulmonary Disease

## 2018-04-03 NOTE — Telephone Encounter (Signed)
Dr. Elsworth Soho please advise to patient's below email message. Thank you!    Dr Elsworth Soho,  Per Dr. Alain Marion, I was to return to work today.I was unaware that I needed a doctor's note in order to return.I have been sent home until I return with a note.Should I get the note from you or Dr. Alain Marion.If you, please provide me a note or let me know if I should contact Dr. Alain Marion.Thank you.  Enjoy your day, Sierra Johnson

## 2018-04-03 NOTE — Telephone Encounter (Signed)
From PCP OK from my standpoint

## 2018-04-03 NOTE — Telephone Encounter (Signed)
Attempted to contact pt. I did not receive an answer. There was no option for me to leave a message. Will try back.  

## 2018-04-04 ENCOUNTER — Encounter: Payer: Self-pay | Admitting: Internal Medicine

## 2018-04-04 NOTE — Telephone Encounter (Signed)
LMTCB

## 2018-04-05 ENCOUNTER — Telehealth: Payer: Self-pay | Admitting: Pulmonary Disease

## 2018-04-05 NOTE — Telephone Encounter (Signed)
Spoke with the pt  See phone note dated 04/03/18  Nothing further needed

## 2018-04-05 NOTE — Telephone Encounter (Signed)
Looked at the letters tab in pt's chart and saw that pt's PCP Dr. Alain Marion wrote a letter to give pt the okay to return back to work.  Nothing further needed.

## 2018-04-10 MED ORDER — FREESTYLE INSULINX SYSTEM W/DEVICE KIT: PACK | 0 refills | Status: DC

## 2018-04-11 LAB — MYCOBACTERIA,CULT W/FLUOROCHROME SMEAR
MICRO NUMBER:: 91067320
SMEAR:: NONE SEEN
SPECIMEN QUALITY:: ADEQUATE

## 2018-04-11 LAB — RESPIRATORY CULTURE OR RESPIRATORY AND SPUTUM CULTURE
MICRO NUMBER:: 91067327
RESULT:: NORMAL
SPECIMEN QUALITY: ADEQUATE

## 2018-04-14 ENCOUNTER — Other Ambulatory Visit: Payer: Self-pay | Admitting: Internal Medicine

## 2018-04-18 ENCOUNTER — Telehealth: Payer: Self-pay

## 2018-04-18 NOTE — Telephone Encounter (Signed)
Left message for patient to call back  

## 2018-04-18 NOTE — Telephone Encounter (Signed)
-----   Message from [name], MD sent at 04/18/2018  3:00 PM EDT ----- She needs OV with FU CXR with TP/BM  RA ----- Message ----- From: Interface, Rad Results In Sent: 03/19/2018   8:03 AM EDT To: [name], MD

## 2018-04-19 ENCOUNTER — Ambulatory Visit (INDEPENDENT_AMBULATORY_CARE_PROVIDER_SITE_OTHER): Payer: BLUE CROSS/BLUE SHIELD | Admitting: Physician Assistant

## 2018-04-19 ENCOUNTER — Encounter: Payer: Self-pay | Admitting: Physician Assistant

## 2018-04-19 VITALS — BP 140/78 | HR 75 | Ht 60.0 in | Wt 217.4 lb

## 2018-04-19 DIAGNOSIS — Z1211 Encounter for screening for malignant neoplasm of colon: Secondary | ICD-10-CM

## 2018-04-19 DIAGNOSIS — Z1212 Encounter for screening for malignant neoplasm of rectum: Secondary | ICD-10-CM

## 2018-04-19 DIAGNOSIS — Z7901 Long term (current) use of anticoagulants: Secondary | ICD-10-CM | POA: Diagnosis not present

## 2018-04-19 NOTE — Progress Notes (Addendum)
Chief Complaint: Preprocedural visit for a screening colonoscopy  HPI:    Sierra Johnson is a 67 year old AA female with a past medical history as listed below, including obstructive sleep apnea with use of a CPAP (echo 03/01/2018 with LVEF 60-65%) and recent acute pulmonary embolism, diagnosed 02/27/2018, on Eliquis, previously known to Dr. Sharlett Iles, who was referred to me by Plotnikov, [name], MD for a preprocedural visit for a screening colonoscopy.    08/07/2006 colonoscopy was completely normal.  Repeat recommended in 10 years.    Today, the patient tells me that she was recently admitted at the beginning of September with an acute PE she is now on Eliquis.  Explains that she saw her physician at the cancer center who recommended that she have a colonoscopy as it had been some time since she had one.  Denies all GI complaints today.    Denies fever, chills, weight loss, anorexia, nausea, vomiting, heartburn, reflux, abdominal pain or change in bowel habits.  Past Medical History:  Diagnosis Date  . ANEMIA-NOS 04/24/2007  . DIABETES MELLITUS, TYPE II 04/24/2007  . Elevated glucose 2011  . HYPERLIPIDEMIA 07/17/2007   mild  . HYPERTENSION 04/24/2007  . Menopause 1998   Dr. Gaetano Net  . OBSTRUCTIVE SLEEP APNEA 09/30/2009   on CPAP 2011  . VITAMIN B12 DEFICIENCY 04/24/2007    Past Surgical History:  Procedure Laterality Date  . ABDOMINAL HYSTERECTOMY    . HERNIA REPAIR  11/11   Incar. midline hernia  . INCISIONAL HERNIA REPAIR N/A 06/03/2014   Procedure: LAPAROSCOPIC REPAIR RECURRENT  INCISIONAL HERNIA;  Surgeon: Georganna Skeans, MD;  Location: [facility];  Service: General;  Laterality: N/A;  . INSERTION OF MESH N/A 06/03/2014   Procedure: INSERTION OF MESH;  Surgeon: Georganna Skeans, MD;  Location: [facility];  Service: General;  Laterality: N/A;    Current Outpatient Medications  Medication Sig Dispense Refill  . albuterol (PROVENTIL HFA;VENTOLIN HFA) 108 (90 Base) MCG/ACT inhaler Inhale 2 puffs  into the lungs every 4 (four) hours as needed for wheezing or shortness of breath. Dispense with aerochamber 1 Inhaler 0  . apixaban (ELIQUIS) 5 MG TABS tablet Take 1 tablet (5 mg total) by mouth 2 (two) times daily. 180 tablet 1  . Blood Glucose Monitoring Suppl (FREESTYLE FLASH SYSTEM) KIT WILL NEED RX FOR LANCETS AND STRIPS ALSO, (UNSURE IF COVERED 1 each 0  . Cholecalciferol (VITAMIN D3) 1000 UNITS CAPS Take 1,000 Units by mouth daily.     [name] docusate sodium (COLACE) 100 MG capsule Take 2 capsules (200 mg total) by mouth 2 (two) times daily. 10 capsule 0  . fish oil-omega-3 fatty acids 1000 MG capsule Take 1 g by mouth 2 (two) times daily.     [name] FREESTYLE INSULINX TEST test strip 1 EACH BY OTHER ROUTE 2 (TWO) TIMES DAILY. USE TO CHECK BLOOD SUGARS TWICE A DAY (Patient taking differently: 1 each by Other route 2 (two) times daily. ) 200 each 1  . FREESTYLE INSULINX TEST test strip 1 EACH BY OTHER ROUTE 2 (TWO) TIMES DAILY. USE TO CHECK BLOOD SUGARS TWICE A DAY 200 each 1  . glucose monitoring kit (FREESTYLE) monitoring kit 1 each by Does not apply route 2 (two) times daily. Dx: E11.9, I10 1 each 0  . guaiFENesin (ROBITUSSIN) 100 MG/5ML SOLN Take 5 mLs (100 mg total) by mouth every 4 (four) hours as needed for cough or to loosen phlegm. 1200 mL 0  . Insulin Glargine (TOUJEO SOLOSTAR) 300 UNIT/ML  SOPN Inject 18 Units into the skin every morning. 12 pen 1  . Insulin Pen Needle 31G X 5 MM MISC 1 Units by Does not apply route 4 (four) times daily. 50 each 11  . ipratropium-albuterol (DUONEB) 0.5-2.5 (3) MG/3ML SOLN Take 3 mLs by nebulization every 6 (six) hours as needed. (Patient taking differently: Take 3 mLs by nebulization every 6 (six) hours as needed (for shortness of breath or wheezing). ) 360 mL 1  . Lancets (FREESTYLE) lancets 1 each by Other route 2 (two) times daily. Dx: E11.9, I10 100 each 11  . levofloxacin (LEVAQUIN) 500 MG tablet Take 1 tablet (500 mg total) by mouth daily. 5 tablet 0  .  MAGNESIUM PO Take 1 tablet by mouth 2 (two) times daily.    . metFORMIN (GLUCOPHAGE) 500 MG tablet TAKE 1 TABLET BY MOUTH 3 TIMES DAILY (Patient taking differently: Take 500 mg by mouth 3 (three) times daily. ) 270 tablet 3  . Methylsulfonylmethane (MSM PO) Take 1 tablet by mouth 2 (two) times daily.    . polyethylene glycol (MIRALAX / GLYCOLAX) packet Take 17 g by mouth 2 (two) times daily. 14 each 0  . promethazine-codeine (PHENERGAN WITH CODEINE) 6.25-10 MG/5ML syrup Take 5 mLs by mouth every 4 (four) hours as needed for cough. 300 mL 0  . Spacer/Aero-Holding Chambers (AEROCHAMBER PLUS) inhaler Use as instructed 1 each 2  . vitamin B-12 (CYANOCOBALAMIN) 1000 MCG tablet Take 1,000 mcg by mouth daily.       No current facility-administered medications for this visit.     Allergies as of 04/19/2018 - Review Complete 03/26/2018  Allergen Reaction Noted  . Amlodipine Swelling and Other (See Comments) 02/11/2015  . Benazepril Swelling and Other (See Comments) 02/11/2015  . Tetracycline Nausea And Vomiting     Family History  Problem Relation Age of Onset  . Heart disease Mother 19       ?CAD  . Cancer Father        lung  . Heart disease Brother 26       CAD  . Hypertension Other     Social History   Socioeconomic History  . Marital status: Married    Spouse name: Not on file  . Number of children: 2  . Years of education: Not on file  . Highest education level: Not on file  Occupational History  . Occupation: Psychologist, occupational: [employer]  Social Needs  . Financial resource strain: Not on file  . Food insecurity:    Worry: Not on file    Inability: Not on file  . Transportation needs:    Medical: Not on file    Non-medical: Not on file  Tobacco Use  . Smoking status: Never Smoker  . Smokeless tobacco: Never Used  Substance and Sexual Activity  . Alcohol use: No  . Drug use: No  . Sexual activity: Yes  Lifestyle  . Physical activity:    Days per  week: Not on file    Minutes per session: Not on file  . Stress: Not on file  Relationships  . Social connections:    Talks on phone: Not on file    Gets together: Not on file    Attends religious service: Not on file    Active member of club or organization: Not on file    Attends meetings of clubs or organizations: Not on file    Relationship status: Not on file  . Intimate partner  violence:    Fear of current or ex partner: Not on file    Emotionally abused: Not on file    Physically abused: Not on file    Forced sexual activity: Not on file  Other Topics Concern  . Not on file  Social History Narrative   Vegan    Review of Systems:    Constitutional: No weight loss, fever or chills Skin: No rash Cardiovascular: No chest pain Respiratory: No SOB Gastrointestinal: See HPI and otherwise negative Genitourinary: No dysuria  Neurological: No headache, dizziness or syncope Musculoskeletal: No new muscle or joint pain Hematologic: No bleeding  Psychiatric: No history of depression or anxiety   Physical Exam:  Vital signs: BP 140/78   Pulse 75   Ht 5' (1.524 m)   Wt 217 lb 6.4 oz (98.6 kg)   BMI 42.46 kg/m   Constitutional:  Overweight AA female appears to be in NAD, Well developed, Well nourished, alert and cooperative Head:  Normocephalic and atraumatic. Eyes:   PEERL, EOMI. No icterus. Conjunctiva pink. Ears:  Normal auditory acuity. Neck:  Supple Throat: Oral cavity and pharynx without inflammation, swelling or lesion.  Respiratory: Respirations even and unlabored. Lungs clear to auscultation bilaterally.   No wheezes, crackles, or rhonchi.  Cardiovascular: Normal S1, S2. No MRG. Regular rate and rhythm. No peripheral edema, cyanosis or pallor.  Gastrointestinal:  Soft, nondistended, nontender. No rebound or guarding. Normal bowel sounds. No appreciable masses or hepatomegaly. Rectal:  Not performed.  Msk:  Symmetrical without gross deformities. Without edema, no  deformity or joint abnormality.  Neurologic:  Alert and  oriented x4;  grossly normal neurologically.  Skin:   Dry and intact without significant lesions or rashes. Psychiatric: Demonstrates good judgement and reason without abnormal affect or behaviors.  RELEVANT LABS AND IMAGING: CBC    Component Value Date/Time   WBC 8.5 03/26/2018 0946   WBC 8.2 03/05/2018 1146   RBC 4.26 03/26/2018 0946   HGB 11.6 03/26/2018 0946   HCT 36.2 03/26/2018 0946   PLT 357 03/26/2018 0946   MCV 85.0 03/26/2018 0946   MCH 27.2 03/26/2018 0946   MCHC 32.0 03/26/2018 0946   RDW 16.5 (H) 03/26/2018 0946   LYMPHSABS 2.4 03/26/2018 0946   MONOABS 0.8 03/26/2018 0946   EOSABS 0.2 03/26/2018 0946   BASOSABS 0.0 03/26/2018 0946    CMP     Component Value Date/Time   NA 139 03/26/2018 0946   K 4.0 03/26/2018 0946   CL 103 03/26/2018 0946   CO2 27 03/26/2018 0946   GLUCOSE 108 (H) 03/26/2018 0946   GLUCOSE 74 05/17/2006 0957   BUN 9 03/26/2018 0946   CREATININE 1.08 (H) 03/26/2018 0946   CALCIUM 10.2 03/26/2018 0946   PROT 8.7 (H) 03/26/2018 0946   ALBUMIN 3.3 (L) 03/26/2018 0946   AST 34 03/26/2018 0946   ALT 30 03/26/2018 0946   ALKPHOS 82 03/26/2018 0946   BILITOT 0.5 03/26/2018 0946   GFRNONAA 53 (L) 03/26/2018 0946   GFRAA >60 03/26/2018 0946    Assessment: 1.  Screening for colorectal cancer: Last colonoscopy in 2008 was completely normal, no family history of colon cancer 2.  Chronic anticoagulation: Recently started on Eliquis at the beginning of September for an acute PE  Plan: 1.  Discussed options with the patient.  She would like to proceed with Cologuard at this time.  Explained that if this is positive and she needs a colonoscopy, we will likely need to wait some time  before she is able to hold her Eliquis. 2. Patient was assigned to Dr. Hilarie Fredrickson today. She will follow in clinic in the future per recommendations after results.  Sierra Newer, PA-C [facility] 04/19/2018, 8:24 AM  Cc: Cassandria Anger, MD   Addendum: Reviewed and agree with assessment and management plan. Pyrtle, Lajuan Lines, MD

## 2018-04-19 NOTE — Patient Instructions (Signed)
Normal BMI (Body Mass Index- based on height and weight) is between 19 and 25. Your BMI today is Body mass index is 42.46 kg/m. [name] Please consider follow up  regarding your BMI with your Primary Care Provider.  Your provider has ordered Cologuard testing as an option for colon cancer screening. This is performed by [employer] and may be out of network with your insurance. PRIOR to completing the test, it is YOUR responsibility to contact your insurance about covered benefits for this test. Your out of pocket expense could be anywhere from $0.00 to $649.00.   When you call to check coverage with your insurer, please provide the following information:   -The ONLY provider of Cologuard is [place] code for Cologuard is [phone].  [employer] NPI # [id number]  -[employer] Tax ID # [id number]   We have already sent your demographic and insurance information to [employer] (phone number [phone]) and they should contact you within the next week regarding your test. If you have not heard from them within the next week, please call our office at [phone].

## 2018-04-25 NOTE — Telephone Encounter (Signed)
Left message for patient to call back  

## 2018-04-30 NOTE — Telephone Encounter (Signed)
Will send letter to patient since she has not returned our call.

## 2018-05-01 ENCOUNTER — Telehealth: Payer: Self-pay | Admitting: Hematology

## 2018-05-01 NOTE — Telephone Encounter (Signed)
Appointment from 12/31 was r/s and patient was notified of new date/time per 11/13 email message from [facility]

## 2018-05-02 ENCOUNTER — Other Ambulatory Visit: Payer: Self-pay | Admitting: Internal Medicine

## 2018-05-06 ENCOUNTER — Telehealth: Payer: Self-pay | Admitting: Pulmonary Disease

## 2018-05-06 NOTE — Telephone Encounter (Signed)
Left message for patient to call back. RA wanted to see her for a F/U of her PNA with a CXR before her visit. We had been trying to reach her since October 2019.

## 2018-05-07 NOTE — Telephone Encounter (Signed)
Called and spoke with pt letting her know that RA was wanting [place] to schedule an OV for her to come in due to the recent PNA to make sure things were going okay in regards to that.  Pt stated to me that she currently does not have any insurance and wanted to know if it was really necessary for her to come in now for an OV.  Dr. Elsworth Soho, please advise on this for pt. Thanks!

## 2018-05-07 NOTE — Telephone Encounter (Signed)
Called patient unable to reach left message to give us a call back.

## 2018-05-07 NOTE — Telephone Encounter (Signed)
She does need FU of pneumonia /PE If she is feeling well, OK to hold off until early next year

## 2018-05-08 NOTE — Telephone Encounter (Signed)
Attempted to call pt but unable to reach her. Left message for pt to return call. 

## 2018-05-09 NOTE — Telephone Encounter (Signed)
Spoke with pt. She will contact us when she is ready for an appointment. Nothing further was needed at this time.

## 2018-05-09 NOTE — Telephone Encounter (Signed)
Attempted to contact pt. I did not receive an answer. I have left a message for pt to return our call.  

## 2018-05-09 NOTE — Telephone Encounter (Signed)
Pt is returning call. Per Pt, she is feeling ok right now and can possibly follow up in Dec. When she has insurance. Cb is [phone].

## 2018-05-09 NOTE — Telephone Encounter (Signed)
Pt is returning call. Cb is [phone].

## 2018-05-12 ENCOUNTER — Other Ambulatory Visit: Payer: Self-pay | Admitting: Internal Medicine

## 2018-05-21 ENCOUNTER — Telehealth: Payer: Self-pay

## 2018-05-21 NOTE — Telephone Encounter (Signed)
j 40

## 2018-05-21 NOTE — Telephone Encounter (Signed)
Copied from [place] [phone]. Topic: General - Other >> May 20, 2018  4:52 PM Percell Belt A wrote: CRM for notification. See Telephone encounter for: 05/20/18.  Kim with [facility] [phone] called in and stated that they are need a Dignosis code from office 8/30.  Pt was seen by Jodi Mourning.  They are needing this in order to send it though their billing department

## 2018-05-21 NOTE — Telephone Encounter (Signed)
Spoke with Corene Cornea today with Customer Service and code provided.

## 2018-05-28 ENCOUNTER — Other Ambulatory Visit: Payer: Self-pay | Admitting: Internal Medicine

## 2018-05-28 NOTE — Telephone Encounter (Signed)
Pt was requesting refill on promethazine-codeine, but medication is on back order. Pharmacy recommended loaded medication, please advise

## 2018-06-17 NOTE — Progress Notes (Signed)
[place] [facility] OFFICE PROGRESS NOTE  Patient Care Team: Plotnikov, [name], MD as PCP - General  HEME/ONC OVERVIEW: 1. Unprovoked PTE of the RUL -02/2018: CTA chest for cough showed a RUL PTE; doppler negative for DVT in lower extremities -02/2018 - present: Eliquis    ASSESSMENT & PLAN:   Unprovoked PTE of the RUL -Patient is tolerating Eliquis well so far without any abnormal bleeding or bruising -Hypercoaguable work-up deferred, as anticoagulation can interfere with testing and it would not change management -The goal of anticoagulation is life-long  -I counseled the importance of preventive strategies such as avoiding hormonal supplement, avoiding cigarette smoking, keeping up-to-date with screening programs for early cancer detection, frequent ambulation for long distance travel and aggressive DVT prophylaxis in all surgical settings. -Should she need any interruption of the anticoagulation for elective procedures in the future, feel free to contact me regarding peri-operative management  Normocytic anemia -Iron profile in 03/2018 showed borderline low iron level -Patient's last colonoscopy in 2008 -She had stool studies for occult bleeding in 04/2018 but she does not know the results -She tried oral iron supplement in the past, but stopped taking it due to constipation; she is not interested in resuming oral iron supplement at this time -I encouraged patient to follow-up with her PCP regarding the Hemoccult studies, and to determine if she will require repeat colonoscopy  Mild leukocytosis -Possibly due to recent URI -Patient reports mild persistent cough with clear sputum production, but denies any fever, chest pain, dyspnea, hemoptysis -Encouraged patient to follow-up with her PCP if her symptoms do not improve  Mild thrombocytosis -Likely reactive in the setting of recent URI -No further work-up indicated at this time -We will monitor for now  Medication  management -Patient had a list of questions regarding over-the-counter herbal and vitamin supplements as well as CBD oil -I discussed with the patient that there is no well-established literature on interactions between Eliquis and various OTC medications -If she desires, I recommended her to take daily multivitamin -Also recommend her to discuss with her PCP regarding potential interactions between OTC medications and her other prescriptions  Orders Placed This Encounter  Procedures  . CBC with Differential (Cancer Center Only)    Standing Status:   Future    Standing Expiration Date:   07/25/2019  . CMP ([place] only)    Standing Status:   Future    Standing Expiration Date:   07/25/2019  . Ferritin    Standing Status:   Future    Standing Expiration Date:   07/25/2019  . Iron and TIBC    Standing Status:   Future    Standing Expiration Date:   07/25/2019   All questions were answered. The patient knows to call the clinic with any problems, questions or concerns. No barriers to learning was detected.  A total of more than 25 minutes were spent face-to-face with the patient during this encounter and over half of that time was spent on counseling and coordination of care as outlined above.   Return in 3 months for labs and clinic follow-up.   Tish Men, MD 06/20/2018 10:41 AM  CHIEF COMPLAINT: "I have a list of questions"  INTERVAL HISTORY: Sierra Johnson returns to clinic for follow-up of PTE on Eliquis.  She reports that she has had intermittent coughing with clear sputum production for the past several weeks, for which she attributed to allergies.  She denies any fever, chest pain, dyspnea, change in sputum color/quantity,  or hemoptysis.  She has not seen her PCP for the respiratory symptoms.  She is compliant with her Eliquis, and denies any abnormal bleeding or bruising.  She recently had stool cards sent back to her PCP, but she does not know the results.  She denies any other  complaint.  SUMMARY OF ONCOLOGIC HISTORY:  No history exists.    REVIEW OF SYSTEMS:   Constitutional: ( - ) fevers, ( - )  chills , ( - ) night sweats Eyes: ( - ) blurriness of vision, ( - ) double vision, ( - ) watery eyes Ears, nose, mouth, throat, and face: ( - ) mucositis, ( - ) sore throat Respiratory: ( - ) cough, ( - ) dyspnea, ( - ) wheezes Cardiovascular: ( - ) palpitation, ( - ) chest discomfort, ( - ) lower extremity swelling Gastrointestinal:  ( - ) nausea, ( - ) heartburn, ( - ) change in bowel habits Skin: ( - ) abnormal skin rashes Lymphatics: ( - ) new lymphadenopathy, ( - ) easy bruising Neurological: ( - ) numbness, ( - ) tingling, ( - ) new weaknesses Behavioral/Psych: ( - ) mood change, ( - ) new changes  All other systems were reviewed with the patient and are negative.  I have reviewed the past medical history, past surgical history, social history and family history with the patient and they are unchanged from previous note.  ALLERGIES:  is allergic to benazepril; amlodipine; and tetracycline.  MEDICATIONS:  Current Outpatient Medications  Medication Sig Dispense Refill  . albuterol (PROVENTIL HFA;VENTOLIN HFA) 108 (90 Base) MCG/ACT inhaler Inhale 2 puffs into the lungs every 4 (four) hours as needed for wheezing or shortness of breath. Dispense with aerochamber 1 Inhaler 0  . apixaban (ELIQUIS) 5 MG TABS tablet Take 1 tablet (5 mg total) by mouth 2 (two) times daily. 180 tablet 2  . Blood Glucose Monitoring Suppl (FREESTYLE FLASH SYSTEM) KIT WILL NEED RX FOR LANCETS AND STRIPS ALSO, (UNSURE IF COVERED 1 each 0  . Cholecalciferol (VITAMIN D3) 1000 UNITS CAPS Take 1,000 Units by mouth daily.     [name] docusate sodium (COLACE) 100 MG capsule Take 2 capsules (200 mg total) by mouth 2 (two) times daily. 10 capsule 0  . fish oil-omega-3 fatty acids 1000 MG capsule Take 1 g by mouth 2 (two) times daily.     [name] FREESTYLE INSULINX TEST test strip 1 EACH BY OTHER ROUTE 2  (TWO) TIMES DAILY. USE TO CHECK BLOOD SUGARS TWICE A DAY (Patient taking differently: 1 each by Other route 2 (two) times daily. ) 200 each 1  . FREESTYLE INSULINX TEST test strip 1 EACH BY OTHER ROUTE 2 (TWO) TIMES DAILY. USE TO CHECK BLOOD SUGARS TWICE A DAY 200 each 1  . glucose monitoring kit (FREESTYLE) monitoring kit 1 each by Does not apply route 2 (two) times daily. Dx: E11.9, I10 1 each 0  . guaiFENesin (ROBITUSSIN) 100 MG/5ML SOLN Take 5 mLs (100 mg total) by mouth every 4 (four) hours as needed for cough or to loosen phlegm. 1200 mL 0  . guaiFENesin-codeine 100-10 MG/5ML syrup Take 5 mLs by mouth every 4 (four) hours as needed for cough. 300 mL 0  . Insulin Glargine (TOUJEO SOLOSTAR) 300 UNIT/ML SOPN Inject 18 Units into the skin every morning. 12 pen 1  . Insulin Pen Needle 31G X 5 MM MISC 1 Units by Does not apply route 4 (four) times daily. 50 each 11  . ipratropium-albuterol (  DUONEB) 0.5-2.5 (3) MG/3ML SOLN Take 3 mLs by nebulization every 6 (six) hours as needed. (Patient taking differently: Take 3 mLs by nebulization every 6 (six) hours as needed (for shortness of breath or wheezing). ) 360 mL 1  . Lancets (FREESTYLE) lancets 1 each by Other route 2 (two) times daily. Dx: E11.9, I10 100 each 11  . MAGNESIUM PO Take 1 tablet by mouth 2 (two) times daily.    . metFORMIN (GLUCOPHAGE) 500 MG tablet TAKE 1 TABLET BY MOUTH 3 TIMES DAILY 270 tablet 3  . Methylsulfonylmethane (MSM PO) Take 1 tablet by mouth 2 (two) times daily.    . polyethylene glycol (MIRALAX / GLYCOLAX) packet Take 17 g by mouth 2 (two) times daily. 14 each 0  . Spacer/Aero-Holding Chambers (AEROCHAMBER PLUS) inhaler Use as instructed 1 each 2  . vitamin B-12 (CYANOCOBALAMIN) 1000 MCG tablet Take 1,000 mcg by mouth daily.       No current facility-administered medications for this visit.     PHYSICAL EXAMINATION: ECOG PERFORMANCE STATUS: 1 - Symptomatic but completely ambulatory  Today's Vitals   06/20/18 0950  06/20/18 0955  BP: (!) 146/83   Pulse: 84   Resp: 18   Temp: 98.2 F (36.8 C)   TempSrc: Oral   SpO2: 99%   Weight: 208 lb 1.9 oz (94.4 kg)   Height: _0  (1.549 m)   PainSc: 0-No pain 0-No pain   Body mass index is 39.32 kg/m.  Filed Weights   06/20/18 0950  Weight: 208 lb 1.9 oz (94.4 kg)    GENERAL: alert, no distress and comfortable, intermittently coughing SKIN: skin color, texture, turgor are normal, no rashes or significant lesions EYES: conjunctiva are pink and non-injected, sclera clear OROPHARYNX: no exudate, no erythema; lips, buccal mucosa, and tongue normal  NECK: supple, non-tender LYMPH:  no palpable lymphadenopathy in the cervical or axillary  LUNGS: clear to auscultation and percussion with normal breathing effort HEART: regular rate & rhythm and no murmurs and no lower extremity edema ABDOMEN: soft, non-tender, non-distended, normal bowel sounds Musculoskeletal: no cyanosis of digits and no clubbing  PSYCH: alert & oriented x 3, fluent speech NEURO: no focal motor/sensory deficits  LABORATORY DATA:  I have reviewed the data as listed    Component Value Date/Time   NA 137 06/20/2018 0941   K 4.5 06/20/2018 0941   CL 102 06/20/2018 0941   CO2 29 06/20/2018 0941   GLUCOSE 115 (H) 06/20/2018 0941   GLUCOSE 74 05/17/2006 0957   BUN 11 06/20/2018 0941   CREATININE 1.10 (H) 06/20/2018 0941   CALCIUM 9.5 06/20/2018 0941   PROT 8.0 06/20/2018 0941   ALBUMIN 3.6 06/20/2018 0941   AST 12 (L) 06/20/2018 0941   ALT 10 06/20/2018 0941   ALKPHOS 77 06/20/2018 0941   BILITOT 0.4 06/20/2018 0941   GFRNONAA 53 (L) 06/20/2018 0941   GFRAA >60 06/20/2018 0941    No results found for: SPEP, UPEP  Lab Results  Component Value Date   WBC 11.2 (H) 06/20/2018   NEUTROABS 6.5 06/20/2018   HGB 11.3 (L) 06/20/2018   HCT 36.8 06/20/2018   MCV 87.0 06/20/2018   PLT 424 (H) 06/20/2018      Chemistry      Component Value Date/Time   NA 137 06/20/2018 0941   K  4.5 06/20/2018 0941   CL 102 06/20/2018 0941   CO2 29 06/20/2018 0941   BUN 11 06/20/2018 0941   CREATININE 1.10 (H) 06/20/2018 0941  Component Value Date/Time   CALCIUM 9.5 06/20/2018 0941   ALKPHOS 77 06/20/2018 0941   AST 12 (L) 06/20/2018 0941   ALT 10 06/20/2018 0941   BILITOT 0.4 06/20/2018 0941

## 2018-06-18 ENCOUNTER — Ambulatory Visit: Payer: BLUE CROSS/BLUE SHIELD | Admitting: Hematology

## 2018-06-18 ENCOUNTER — Other Ambulatory Visit: Payer: BLUE CROSS/BLUE SHIELD

## 2018-06-20 ENCOUNTER — Inpatient Hospital Stay: Payer: Medicare Other | Attending: Hematology

## 2018-06-20 ENCOUNTER — Other Ambulatory Visit: Payer: Self-pay | Admitting: *Deleted

## 2018-06-20 ENCOUNTER — Encounter: Payer: Self-pay | Admitting: Hematology

## 2018-06-20 ENCOUNTER — Inpatient Hospital Stay (HOSPITAL_BASED_OUTPATIENT_CLINIC_OR_DEPARTMENT_OTHER): Payer: Medicare Other | Admitting: Hematology

## 2018-06-20 VITALS — BP 146/83 | HR 84 | Temp 98.2°F | Resp 18 | Ht 61.0 in | Wt 208.1 lb

## 2018-06-20 DIAGNOSIS — Z79899 Other long term (current) drug therapy: Secondary | ICD-10-CM

## 2018-06-20 DIAGNOSIS — R7989 Other specified abnormal findings of blood chemistry: Secondary | ICD-10-CM | POA: Insufficient documentation

## 2018-06-20 DIAGNOSIS — D649 Anemia, unspecified: Secondary | ICD-10-CM | POA: Diagnosis not present

## 2018-06-20 DIAGNOSIS — Z7901 Long term (current) use of anticoagulants: Secondary | ICD-10-CM | POA: Diagnosis not present

## 2018-06-20 DIAGNOSIS — I2699 Other pulmonary embolism without acute cor pulmonale: Secondary | ICD-10-CM

## 2018-06-20 DIAGNOSIS — D72829 Elevated white blood cell count, unspecified: Secondary | ICD-10-CM | POA: Insufficient documentation

## 2018-06-20 DIAGNOSIS — D75839 Thrombocytosis, unspecified: Secondary | ICD-10-CM

## 2018-06-20 DIAGNOSIS — D473 Essential (hemorrhagic) thrombocythemia: Secondary | ICD-10-CM

## 2018-06-20 DIAGNOSIS — Z794 Long term (current) use of insulin: Secondary | ICD-10-CM | POA: Insufficient documentation

## 2018-06-20 LAB — CBC WITH DIFFERENTIAL (CANCER CENTER ONLY)
ABS IMMATURE GRANULOCYTES: 0.02 10*3/uL (ref 0.00–0.07)
BASOS PCT: 0 %
Basophils Absolute: 0 10*3/uL (ref 0.0–0.1)
Eosinophils Absolute: 0.8 10*3/uL — ABNORMAL HIGH (ref 0.0–0.5)
Eosinophils Relative: 7 %
HEMATOCRIT: 36.8 % (ref 36.0–46.0)
HEMOGLOBIN: 11.3 g/dL — AB (ref 12.0–15.0)
IMMATURE GRANULOCYTES: 0 %
LYMPHS ABS: 2.8 10*3/uL (ref 0.7–4.0)
LYMPHS PCT: 25 %
MCH: 26.7 pg (ref 26.0–34.0)
MCHC: 30.7 g/dL (ref 30.0–36.0)
MCV: 87 fL (ref 80.0–100.0)
MONO ABS: 1.1 10*3/uL — AB (ref 0.1–1.0)
Monocytes Relative: 10 %
Neutro Abs: 6.5 10*3/uL (ref 1.7–7.7)
Neutrophils Relative %: 58 %
PLATELETS: 424 10*3/uL — AB (ref 150–400)
RBC: 4.23 MIL/uL (ref 3.87–5.11)
RDW: 13.7 % (ref 11.5–15.5)
WBC Count: 11.2 10*3/uL — ABNORMAL HIGH (ref 4.0–10.5)
nRBC: 0 % (ref 0.0–0.2)

## 2018-06-20 LAB — CMP (CANCER CENTER ONLY)
ALBUMIN: 3.6 g/dL (ref 3.5–5.0)
ALT: 10 U/L (ref 0–44)
AST: 12 U/L — AB (ref 15–41)
Alkaline Phosphatase: 77 U/L (ref 38–126)
Anion gap: 6 (ref 5–15)
BILIRUBIN TOTAL: 0.4 mg/dL (ref 0.3–1.2)
BUN: 11 mg/dL (ref 8–23)
CHLORIDE: 102 mmol/L (ref 98–111)
CO2: 29 mmol/L (ref 22–32)
Calcium: 9.5 mg/dL (ref 8.9–10.3)
Creatinine: 1.1 mg/dL — ABNORMAL HIGH (ref 0.44–1.00)
GFR, Est AFR Am: >60 mL/min (ref >60)
GFR, Estimated: 53 mL/min — ABNORMAL LOW (ref >60)
GLUCOSE: 115 mg/dL — AB (ref 70–99)
Potassium: 4.5 mmol/L (ref 3.5–5.1)
Sodium: 137 mmol/L (ref 135–145)
Total Protein: 8 g/dL (ref 6.5–8.1)

## 2018-06-20 MED ORDER — APIXABAN 5 MG PO TABS: 5.0000 mg | ORAL_TABLET | Freq: Two times a day (BID) | ORAL | 2 refills | Status: DC

## 2018-06-24 ENCOUNTER — Encounter: Payer: Self-pay | Admitting: Hematology

## 2018-06-25 ENCOUNTER — Ambulatory Visit: Payer: BLUE CROSS/BLUE SHIELD | Admitting: Hematology

## 2018-06-25 ENCOUNTER — Other Ambulatory Visit: Payer: BLUE CROSS/BLUE SHIELD

## 2018-06-27 ENCOUNTER — Other Ambulatory Visit: Payer: Self-pay | Admitting: *Deleted

## 2018-06-27 DIAGNOSIS — I2699 Other pulmonary embolism without acute cor pulmonale: Secondary | ICD-10-CM

## 2018-06-27 MED ORDER — APIXABAN 5 MG PO TABS: 5.0000 mg | ORAL_TABLET | Freq: Two times a day (BID) | ORAL | 2 refills | Status: DC

## 2018-07-04 ENCOUNTER — Other Ambulatory Visit: Payer: Self-pay

## 2018-07-04 ENCOUNTER — Telehealth: Payer: Self-pay | Admitting: Physician Assistant

## 2018-07-04 ENCOUNTER — Telehealth: Payer: Self-pay | Admitting: *Deleted

## 2018-07-04 LAB — COLOGUARD: Cologuard: NEGATIVE

## 2018-07-04 NOTE — Telephone Encounter (Signed)
Sierra Johnson the pt is wanting results from cologuard.  Vivien Rota was going to send to you today.  Thanks.

## 2018-07-04 NOTE — Telephone Encounter (Signed)
Hi Baxter Flattery,  Can you talk with the insurance company and find out what they need from [place]?  Thank you.  Dr. Maylon Peppers

## 2018-07-04 NOTE — Telephone Encounter (Signed)
Received voice mail message from patient stating,"can Dr. Lorette Ang office call [phone] and ask for a Cheer Exception for my Sierra Johnson? The prescription cost to much. My return number is [phone] if you have any questions. Thank you."

## 2018-07-04 NOTE — Telephone Encounter (Signed)
Pt call in wanting to discuss lab results from take home colon screen

## 2018-07-05 NOTE — Telephone Encounter (Signed)
It was negative. Please let patient know. Thanks-JLL

## 2018-07-05 NOTE — Telephone Encounter (Signed)
Left message on machine to call back  

## 2018-07-05 NOTE — Telephone Encounter (Signed)
The patient has been notified of this information and all questions answered.

## 2018-07-09 NOTE — Telephone Encounter (Signed)
Okay. Thanks for calling the patient's insurance. Please keep me updated on her status for patient assistance.  [place]

## 2018-07-10 ENCOUNTER — Other Ambulatory Visit: Payer: Self-pay | Admitting: Internal Medicine

## 2018-08-02 DIAGNOSIS — M25561 Pain in right knee: Secondary | ICD-10-CM | POA: Diagnosis not present

## 2018-08-02 DIAGNOSIS — M1711 Unilateral primary osteoarthritis, right knee: Secondary | ICD-10-CM | POA: Diagnosis not present

## 2018-08-19 ENCOUNTER — Encounter: Payer: Self-pay | Admitting: Internal Medicine

## 2018-08-19 ENCOUNTER — Ambulatory Visit (INDEPENDENT_AMBULATORY_CARE_PROVIDER_SITE_OTHER): Payer: Medicare Other | Admitting: Internal Medicine

## 2018-08-19 VITALS — BP 130/82 | HR 80 | Temp 97.6°F | Ht 61.0 in | Wt 196.0 lb

## 2018-08-19 DIAGNOSIS — Z23 Encounter for immunization: Secondary | ICD-10-CM | POA: Diagnosis not present

## 2018-08-19 DIAGNOSIS — E1169 Type 2 diabetes mellitus with other specified complication: Secondary | ICD-10-CM

## 2018-08-19 DIAGNOSIS — Z01818 Encounter for other preprocedural examination: Secondary | ICD-10-CM | POA: Diagnosis not present

## 2018-08-19 DIAGNOSIS — J309 Allergic rhinitis, unspecified: Secondary | ICD-10-CM

## 2018-08-19 DIAGNOSIS — I2699 Other pulmonary embolism without acute cor pulmonale: Secondary | ICD-10-CM

## 2018-08-19 DIAGNOSIS — E538 Deficiency of other specified B group vitamins: Secondary | ICD-10-CM | POA: Diagnosis not present

## 2018-08-19 DIAGNOSIS — E785 Hyperlipidemia, unspecified: Secondary | ICD-10-CM

## 2018-08-19 DIAGNOSIS — E669 Obesity, unspecified: Secondary | ICD-10-CM

## 2018-08-19 MED ORDER — AZITHROMYCIN 250 MG PO TABS: ORAL_TABLET | ORAL | 0 refills | Status: DC

## 2018-08-19 MED ORDER — LORATADINE 10 MG PO TABS: 10.0000 mg | ORAL_TABLET | Freq: Every day | ORAL | 11 refills | Status: DC

## 2018-08-19 MED ORDER — FREESTYLE SYSTEM KIT: 1.0000 | PACK | Freq: Two times a day (BID) | 0 refills | Status: DC

## 2018-08-19 MED ORDER — METHYLPREDNISOLONE ACETATE 80 MG/ML IJ SUSP
80.0000 mg | Freq: Once | INTRAMUSCULAR | Status: AC
  Administered 2018-08-19: 80 mg via INTRAMUSCULAR

## 2018-08-19 MED ORDER — FLUTICASONE PROPIONATE 50 MCG/ACT NA SUSP: 2.0000 | Freq: Every day | NASAL | 6 refills | Status: DC

## 2018-08-19 NOTE — Patient Instructions (Signed)
You can use over-the-counter  "cold" medicines  such as  "Afrin" nasal spray for nasal congestion as directed - 5-7 days. Use " Delsym" or" Robitussin" cough syrup varietis for cough.  You can use plain "Tylenol" or "Advil" for fever, chills and achyness. Use Halls or Ricola cough drops.  Flonase, Claritin daily Benadryl at night   Please, make an appointment if you are not better or if you're worse.

## 2018-08-19 NOTE — Assessment & Plan Note (Addendum)
Claritin Afrin x 3-7 d. Flonase Zpac Medrol dosepack if worse Depomedrol 80 mg Benadryl prn

## 2018-08-19 NOTE — Progress Notes (Signed)
Subjective:  Patient ID: Sierra Johnson, female    DOB: 06-Feb-1951  Age: 68 y.o. MRN: [id number]  CC: No chief complaint on file.   HPI Sierra Johnson presents for a surgical clearance Req by Dr Wynelle Link Reason - R TKR 10/07/18 med clearance Hx: 68 yo w/PE, anticoagulation, HTN, OA, obesity - all stable C/o sinus congestion, pain x 1-2 weeks; recurrent   Past Medical History:  Diagnosis Date  . ANEMIA-NOS 04/24/2007  . Arthritis   . DIABETES MELLITUS, TYPE II 04/24/2007  . Elevated glucose 2011  . HYPERLIPIDEMIA 07/17/2007   mild  . HYPERTENSION 04/24/2007  . Menopause 1998   Dr. Gaetano Net  . OBSTRUCTIVE SLEEP APNEA 09/30/2009   on CPAP 2011  . VITAMIN B12 DEFICIENCY 04/24/2007   Past Surgical History:  Procedure Laterality Date  . ABDOMINAL HYSTERECTOMY    . HERNIA REPAIR  11/11   Incar. midline hernia  . INCISIONAL HERNIA REPAIR N/A 06/03/2014   Procedure: LAPAROSCOPIC REPAIR RECURRENT  INCISIONAL HERNIA;  Surgeon: Georganna Skeans, MD;  Location: [facility];  Service: General;  Laterality: N/A;  . INSERTION OF MESH N/A 06/03/2014   Procedure: INSERTION OF MESH;  Surgeon: Georganna Skeans, MD;  Location: [facility];  Service: General;  Laterality: N/A;    reports that she has never smoked. She has never used smokeless tobacco. She reports that she does not drink alcohol or use drugs. family history includes Cancer in her father; Heart disease (age of onset: 3) in her brother; Heart disease (age of onset: 2) in her mother; Hypertension in an other family member. Allergies  Allergen Reactions  . Benazepril Swelling and Other (See Comments)    Lip swelling  . Amlodipine Swelling and Other (See Comments)    LE swelling on 10 mg  . Tetracycline Nausea And Vomiting     Outpatient Medications Prior to Visit  Medication Sig Dispense Refill  . albuterol (PROVENTIL HFA;VENTOLIN HFA) 108 (90 Base) MCG/ACT inhaler Inhale 2 puffs into the lungs every 4 (four) hours as needed for wheezing or  shortness of breath. Dispense with aerochamber 1 Inhaler 0  . apixaban (ELIQUIS) 5 MG TABS tablet Take 1 tablet (5 mg total) by mouth 2 (two) times daily. 180 tablet 2  . Blood Glucose Monitoring Suppl (FREESTYLE FLASH SYSTEM) KIT WILL NEED RX FOR LANCETS AND STRIPS ALSO, (UNSURE IF COVERED 1 each 0  . Cholecalciferol (VITAMIN D3) 1000 UNITS CAPS Take 1,000 Units by mouth daily.     [name] docusate sodium (COLACE) 100 MG capsule Take 2 capsules (200 mg total) by mouth 2 (two) times daily. 10 capsule 0  . fish oil-omega-3 fatty acids 1000 MG capsule Take 1 g by mouth 2 (two) times daily.     [name] FREESTYLE INSULINX TEST test strip 1 EACH BY OTHER ROUTE 2 (TWO) TIMES DAILY. USE TO CHECK BLOOD SUGARS TWICE A DAY (Patient taking differently: 1 each by Other route 2 (two) times daily. ) 200 each 1  . FREESTYLE INSULINX TEST test strip 1 EACH BY OTHER ROUTE 2 (TWO) TIMES DAILY. USE TO CHECK BLOOD SUGARS TWICE A DAY 200 each 1  . glucose monitoring kit (FREESTYLE) monitoring kit 1 each by Does not apply route 2 (two) times daily. Dx: E11.9, I10 1 each 0  . guaiFENesin (ROBITUSSIN) 100 MG/5ML SOLN Take 5 mLs (100 mg total) by mouth every 4 (four) hours as needed for cough or to loosen phlegm. 1200 mL 0  . guaiFENesin-codeine 100-10 MG/5ML syrup Take  5 mLs by mouth every 4 (four) hours as needed for cough. 300 mL 0  . Insulin Pen Needle 31G X 5 MM MISC 1 Units by Does not apply route 4 (four) times daily. 50 each 11  . ipratropium-albuterol (DUONEB) 0.5-2.5 (3) MG/3ML SOLN Take 3 mLs by nebulization every 6 (six) hours as needed. (Patient taking differently: Take 3 mLs by nebulization every 6 (six) hours as needed (for shortness of breath or wheezing). ) 360 mL 1  . Lancets (FREESTYLE) lancets 1 each by Other route 2 (two) times daily. Dx: E11.9, I10 100 each 11  . MAGNESIUM PO Take 1 tablet by mouth 2 (two) times daily.    . metFORMIN (GLUCOPHAGE) 500 MG tablet TAKE 1 TABLET BY MOUTH 3 TIMES DAILY 270 tablet 3  .  Methylsulfonylmethane (MSM PO) Take 1 tablet by mouth 2 (two) times daily.    . polyethylene glycol (MIRALAX / GLYCOLAX) packet Take 17 g by mouth 2 (two) times daily. 14 each 0  . Spacer/Aero-Holding Chambers (AEROCHAMBER PLUS) inhaler Use as instructed 1 each 2  . TOUJEO SOLOSTAR 300 UNIT/ML SOPN INJECT 18 UNITS INTO THE SKIN EVERY MORNING. 13.5 pen 9  . vitamin B-12 (CYANOCOBALAMIN) 1000 MCG tablet Take 1,000 mcg by mouth daily.       No facility-administered medications prior to visit.     ROS: Review of Systems  Constitutional: Negative for activity change, appetite change, chills, fatigue and unexpected weight change.  HENT: Positive for congestion, sinus pressure, sinus pain and sore throat. Negative for mouth sores.   Eyes: Negative for visual disturbance.  Respiratory: Positive for chest tightness. Negative for cough.   Gastrointestinal: Negative for abdominal pain and nausea.  Genitourinary: Negative for difficulty urinating, frequency and vaginal pain.  Musculoskeletal: Positive for arthralgias. Negative for back pain and gait problem.  Skin: Negative for pallor and rash.  Neurological: Negative for dizziness, tremors, weakness, numbness and headaches.  Psychiatric/Behavioral: Negative for confusion, sleep disturbance and suicidal ideas.    Objective:  BP 130/82 (BP Location: Left Arm, Patient Position: Sitting, Cuff Size: Large)   Pulse 80   Temp 97.6 F (36.4 C) (Oral)   Ht _0  (1.549 m)   Wt 196 lb (88.9 kg)   SpO2 97%   BMI 37.03 kg/m   BP Readings from Last 3 Encounters:  08/19/18 130/82  06/20/18 (!) 146/83  04/19/18 140/78    Wt Readings from Last 3 Encounters:  08/19/18 196 lb (88.9 kg)  06/20/18 208 lb 1.9 oz (94.4 kg)  04/19/18 217 lb 6.4 oz (98.6 kg)    Physical Exam Constitutional:      General: She is not in acute distress.    Appearance: She is well-developed.  HENT:     Head: Normocephalic.     Right Ear: External ear normal.     Left  Ear: External ear normal.     Nose: Nose normal.  Eyes:     General:        Right eye: No discharge.        Left eye: No discharge.     Conjunctiva/sclera: Conjunctivae normal.     Pupils: Pupils are equal, round, and reactive to light.  Neck:     Musculoskeletal: Normal range of motion and neck supple.     Thyroid: No thyromegaly.     Vascular: No JVD.     Trachea: No tracheal deviation.  Cardiovascular:     Rate and Rhythm: Normal rate and regular rhythm.  Heart sounds: Normal heart sounds.  Pulmonary:     Effort: No respiratory distress.     Breath sounds: No stridor. No wheezing.  Abdominal:     General: Bowel sounds are normal. There is no distension.     Palpations: Abdomen is soft. There is no mass.     Tenderness: There is no abdominal tenderness. There is no guarding or rebound.  Musculoskeletal:        General: No tenderness.  Lymphadenopathy:     Cervical: No cervical adenopathy.  Skin:    Findings: No erythema or rash.  Neurological:     Cranial Nerves: No cranial nerve deficit.     Motor: No abnormal muscle tone.     Coordination: Coordination normal.     Deep Tendon Reflexes: Reflexes normal.  Psychiatric:        Behavior: Behavior normal.        Thought Content: Thought content normal.        Judgment: Judgment normal.   obese Congested nasal passages  Lab Results  Component Value Date   WBC 11.2 (H) 06/20/2018   HGB 11.3 (L) 06/20/2018   HCT 36.8 06/20/2018   PLT 424 (H) 06/20/2018   GLUCOSE 115 (H) 06/20/2018   CHOL 220 (H) 04/27/2017   TRIG 60.0 04/27/2017   HDL 79.80 04/27/2017   LDLDIRECT 119.5 08/27/2012   LDLCALC 128 (H) 04/27/2017   ALT 10 06/20/2018   AST 12 (L) 06/20/2018   NA 137 06/20/2018   K 4.5 06/20/2018   CL 102 06/20/2018   CREATININE 1.10 (H) 06/20/2018   BUN 11 06/20/2018   CO2 29 06/20/2018   TSH 1.41 04/27/2017   HGBA1C 6.6 (H) 04/27/2017   MICROALBUR 37.9 (H) 04/27/2017    Dg Chest 2 View  Result Date:  03/19/2018 CLINICAL DATA:  Cough and shortness of breath for the past 2 months. History of diabetes and hypertension. Previous pulmonary embolism. Nonsmoker. EXAM: CHEST - 2 VIEW COMPARISON:  CT scan of the chest of February 27, 2018 and chest x-ray of February 15, 2018 FINDINGS: There is dense infiltrate in the left lower lobe which is new. The left upper lobe and the right lung are clear. The heart is normal in size. The pulmonary vascularity is not engorged. There is calcification in the wall of the aortic arch. There is multilevel degenerative disc disease of the thoracic spine. IMPRESSION: Increased density in the left retrocardiac region consistent with pneumonia similar to that seen on the CT scan of approximately 3 weeks ago. No CHF. Followup PA and lateral chest X-ray is recommended in 3-4 weeks following trial of antibiotic therapy to ensure resolution and exclude underlying malignancy. Thoracic aortic atherosclerosis. Electronically Signed   By: David  Martinique M.D.   On: 03/19/2018 07:59    Assessment & Plan:   There are no diagnoses linked to this encounter.   No orders of the defined types were placed in this encounter.    Follow-up: No follow-ups on file.  Walker Kehr, MD

## 2018-08-20 ENCOUNTER — Encounter: Payer: Self-pay | Admitting: Hematology

## 2018-08-20 ENCOUNTER — Other Ambulatory Visit: Payer: Self-pay

## 2018-08-20 ENCOUNTER — Inpatient Hospital Stay: Payer: Medicare Other | Attending: Hematology | Admitting: Hematology

## 2018-08-20 ENCOUNTER — Other Ambulatory Visit: Payer: Self-pay | Admitting: Hematology

## 2018-08-20 ENCOUNTER — Inpatient Hospital Stay: Payer: Medicare Other

## 2018-08-20 VITALS — BP 141/79 | HR 85 | Temp 98.2°F | Resp 18 | Ht 61.0 in | Wt 194.0 lb

## 2018-08-20 DIAGNOSIS — E1169 Type 2 diabetes mellitus with other specified complication: Secondary | ICD-10-CM | POA: Diagnosis not present

## 2018-08-20 DIAGNOSIS — D649 Anemia, unspecified: Secondary | ICD-10-CM | POA: Diagnosis not present

## 2018-08-20 DIAGNOSIS — I2699 Other pulmonary embolism without acute cor pulmonale: Secondary | ICD-10-CM

## 2018-08-20 DIAGNOSIS — M170 Bilateral primary osteoarthritis of knee: Secondary | ICD-10-CM | POA: Diagnosis not present

## 2018-08-20 DIAGNOSIS — Z7901 Long term (current) use of anticoagulants: Secondary | ICD-10-CM | POA: Insufficient documentation

## 2018-08-20 DIAGNOSIS — E669 Obesity, unspecified: Secondary | ICD-10-CM

## 2018-08-20 LAB — CMP (CANCER CENTER ONLY)
ALT: 9 U/L (ref 0–44)
AST: 13 U/L — ABNORMAL LOW (ref 15–41)
Albumin: 3.9 g/dL (ref 3.5–5.0)
Alkaline Phosphatase: 71 U/L (ref 38–126)
Anion gap: 8 (ref 5–15)
BUN: 8 mg/dL (ref 8–23)
CO2: 28 mmol/L (ref 22–32)
Calcium: 9.9 mg/dL (ref 8.9–10.3)
Chloride: 101 mmol/L (ref 98–111)
Creatinine: 0.97 mg/dL (ref 0.44–1.00)
GFR, Est AFR Am: >60 mL/min (ref >60)
GFR, Estimated: >60 mL/min (ref >60)
Glucose, Bld: 117 mg/dL — ABNORMAL HIGH (ref 70–99)
Potassium: 4.5 mmol/L (ref 3.5–5.1)
Sodium: 137 mmol/L (ref 135–145)
Total Bilirubin: 0.5 mg/dL (ref 0.3–1.2)
Total Protein: 8.4 g/dL — ABNORMAL HIGH (ref 6.5–8.1)

## 2018-08-20 LAB — CBC WITH DIFFERENTIAL (CANCER CENTER ONLY)
Abs Immature Granulocytes: 0.03 10*3/uL (ref 0.00–0.07)
BASOS ABS: 0 10*3/uL (ref 0.0–0.1)
Basophils Relative: 0 %
Eosinophils Absolute: 0.2 10*3/uL (ref 0.0–0.5)
Eosinophils Relative: 2 %
HCT: 33.2 % — ABNORMAL LOW (ref 36.0–46.0)
Hemoglobin: 10.6 g/dL — ABNORMAL LOW (ref 12.0–15.0)
Immature Granulocytes: 0 %
Lymphocytes Relative: 25 %
Lymphs Abs: 2.3 10*3/uL (ref 0.7–4.0)
MCH: 27.2 pg (ref 26.0–34.0)
MCHC: 31.9 g/dL (ref 30.0–36.0)
MCV: 85.1 fL (ref 80.0–100.0)
Monocytes Absolute: 0.8 10*3/uL (ref 0.1–1.0)
Monocytes Relative: 8 %
NRBC: 0 % (ref 0.0–0.2)
Neutro Abs: 6.1 10*3/uL (ref 1.7–7.7)
Neutrophils Relative %: 65 %
Platelet Count: 389 10*3/uL (ref 150–400)
RBC: 3.9 MIL/uL (ref 3.87–5.11)
RDW: 16.2 % — ABNORMAL HIGH (ref 11.5–15.5)
WBC Count: 9.4 10*3/uL (ref 4.0–10.5)

## 2018-08-20 LAB — HEMOGLOBIN A1C
Hgb A1c MFr Bld: 6.3 % — ABNORMAL HIGH (ref 4.8–5.6)
Mean Plasma Glucose: 134.11 mg/dL

## 2018-08-20 MED ORDER — APIXABAN 2.5 MG PO TABS: 2.5000 mg | ORAL_TABLET | Freq: Two times a day (BID) | ORAL | 3 refills | Status: DC

## 2018-08-20 NOTE — Progress Notes (Signed)
[facility] OFFICE PROGRESS NOTE  Patient Care Team: Plotnikov, [name], MD as PCP - General  HEME/ONC OVERVIEW: 1. Unprovoked PTE of the RUL -02/2018: CTA chest for cough showed a RUL PTE; doppler negative for DVT in lower extremities -02/2018 - present: Eliquis    TREATMENT REGIMEN:  Early 02/2018 - present: Eliquis '5mg'$  BID   ASSESSMENT & PLAN:   Unprovoked PTE of the RUL -Patient is tolerating Eliquis well so far without any abnormal bleeding or bruising -Hypercoagulable work-up not indicated, as the goal of anticoagulation is lifelong and the results of the hypercoagulable work-up would not change the management -As the patient has completed at least 6 months of therapeutic Eliquis dosing, she is a good candidate for reducing the Eliquis dose by half for secondary prophylaxis -I discussed the pros and cons of Eliquis for secondary prophylaxis, and after lengthy discussions, the patient agreed with the plan -I have sent Eliquis 2.'5mg'$  BID to the patient's pharmacy -Patient reports high copay for her prescription; I discussed the case with the financial counselor, who will explore any assistant program the patient may qualify for -I also counseled the patient on the importance of preventative strategy, such as avoiding hormonal supplements, avoiding cigarette smoking, keeping up-to-date with cancer screening, frequent ambulation for long distance travel, and aggressive DVT prophylaxis all surgical settings -Should she require any interruption of anticoagulation for elective procedures in the future, feel free to contact me regarding any perioperative management  Normocytic anemia -Hgb 10.6 today, worse since 06/2018; etiology unclear -Borderline low iron level in 03/2018; cologuard negative in 06/2018 -Last colonoscopy in 2008  -Patient was reluctant to undergo further GI work-up; I explained to the patient in detail regarding the rationale for endoscopic evaluation,  especially given the unexplained progressive anemia -After lengthy discussion, patient was amendable to the plan, and I have referred the patient to gastroenterology for further evaluation, including EGD and colonoscopy as indicated  Osteoarthritis -Patient is scheduled to undergo R knee replacement in 09/2018 -For per-operative anticoagulation, patient can stop Eliquis two days prior to the surgery, and may resume anticoagulation 48 to 72 hours after the surgery (ie. post-operative Day 2 to 3)  Type II DM -Patient requests to have her A1c checked today  -The lab has been ordered, and I instructed the patient to follow up with her PCP for further management   Orders Placed This Encounter  Procedures  . Hemoglobin A1c    Standing Status:   Future    Number of Occurrences:   1    Standing Expiration Date:   08/20/2019  . Ambulatory referral to Gastroenterology    Referral Priority:   Routine    Referral Type:   Consultation    Referral Reason:   Specialty Services Required    Referred to Provider:   Lavena Bullion, DO    Number of Visits Requested:   1   All questions were answered. The patient knows to call the clinic with any problems, questions or concerns. No barriers to learning was detected.  A total of more than 25 minutes were spent face-to-face with the patient during this encounter and over half of that time was spent on counseling and coordination of care as outlined above.   Return in 3 months for labs and clinic follow-up.   Tish Men, MD 08/20/2018 11:22 AM  CHIEF COMPLAINT: "I am doing okay so far"  INTERVAL HISTORY: Ms. Philbert returns to clinic for follow-up of unprovoked PTE on  Eliquis.  Patient reports that since last visit, she has been doing relatively well and denies any abnormal bleeding or bruising on Eliquis.  She has a high co-pay for Eliquis, and inquires if there is any other anticoagulation option for her PTE.  She otherwise denies any new symptoms  today.  REVIEW OF SYSTEMS:   Constitutional: ( - ) fevers, ( - )  chills , ( - ) night sweats Eyes: ( - ) blurriness of vision, ( - ) double vision, ( - ) watery eyes Ears, nose, mouth, throat, and face: ( - ) mucositis, ( - ) sore throat Respiratory: ( - ) cough, ( - ) dyspnea, ( - ) wheezes Cardiovascular: ( - ) palpitation, ( - ) chest discomfort, ( - ) lower extremity swelling Gastrointestinal:  ( - ) nausea, ( - ) heartburn, ( - ) change in bowel habits Skin: ( - ) abnormal skin rashes Lymphatics: ( - ) new lymphadenopathy, ( - ) easy bruising Neurological: ( - ) numbness, ( - ) tingling, ( - ) new weaknesses Behavioral/Psych: ( - ) mood change, ( - ) new changes  All other systems were reviewed with the patient and are negative.  I have reviewed the past medical history, past surgical history, social history and family history with the patient and they are unchanged from previous note.  ALLERGIES:  is allergic to benazepril; amlodipine; and tetracycline.  MEDICATIONS:  Current Outpatient Medications  Medication Sig Dispense Refill  . albuterol (PROVENTIL HFA;VENTOLIN HFA) 108 (90 Base) MCG/ACT inhaler Inhale 2 puffs into the lungs every 4 (four) hours as needed for wheezing or shortness of breath. Dispense with aerochamber 1 Inhaler 0  . azithromycin (ZITHROMAX Z-PAK) 250 MG tablet As directed 6 tablet 0  . Cholecalciferol (VITAMIN D3) 1000 UNITS CAPS Take 1,000 Units by mouth daily.     [name] docusate sodium (COLACE) 100 MG capsule Take 2 capsules (200 mg total) by mouth 2 (two) times daily. 10 capsule 0  . fish oil-omega-3 fatty acids 1000 MG capsule Take 1 g by mouth 2 (two) times daily.     . fluticasone (FLONASE) 50 MCG/ACT nasal spray Place 2 sprays into both nostrils daily. 16 g 6  . FREESTYLE INSULINX TEST test strip 1 EACH BY OTHER ROUTE 2 (TWO) TIMES DAILY. USE TO CHECK BLOOD SUGARS TWICE A DAY (Patient taking differently: 1 each by Other route 2 (two) times daily. ) 200 each  1  . FREESTYLE INSULINX TEST test strip 1 EACH BY OTHER ROUTE 2 (TWO) TIMES DAILY. USE TO CHECK BLOOD SUGARS TWICE A DAY 200 each 1  . glucose monitoring kit (FREESTYLE) monitoring kit 1 each by Does not apply route 2 (two) times daily. Dx: E11.9, I10 1 each 0  . guaiFENesin (ROBITUSSIN) 100 MG/5ML SOLN Take 5 mLs (100 mg total) by mouth every 4 (four) hours as needed for cough or to loosen phlegm. 1200 mL 0  . guaiFENesin-codeine 100-10 MG/5ML syrup Take 5 mLs by mouth every 4 (four) hours as needed for cough. 300 mL 0  . Insulin Pen Needle 31G X 5 MM MISC 1 Units by Does not apply route 4 (four) times daily. 50 each 11  . ipratropium-albuterol (DUONEB) 0.5-2.5 (3) MG/3ML SOLN Take 3 mLs by nebulization every 6 (six) hours as needed. (Patient taking differently: Take 3 mLs by nebulization every 6 (six) hours as needed (for shortness of breath or wheezing). ) 360 mL 1  . Lancets (FREESTYLE) lancets 1 each  by Other route 2 (two) times daily. Dx: E11.9, I10 100 each 11  . loratadine (CLARITIN) 10 MG tablet Take 1 tablet (10 mg total) by mouth daily. 30 tablet 11  . MAGNESIUM PO Take 1 tablet by mouth 2 (two) times daily.    . metFORMIN (GLUCOPHAGE) 500 MG tablet TAKE 1 TABLET BY MOUTH 3 TIMES DAILY 270 tablet 3  . Methylsulfonylmethane (MSM PO) Take 1 tablet by mouth 2 (two) times daily.    . polyethylene glycol (MIRALAX / GLYCOLAX) packet Take 17 g by mouth 2 (two) times daily. 14 each 0  . Spacer/Aero-Holding Chambers (AEROCHAMBER PLUS) inhaler Use as instructed 1 each 2  . TOUJEO SOLOSTAR 300 UNIT/ML SOPN INJECT 18 UNITS INTO THE SKIN EVERY MORNING. 13.5 pen 9  . vitamin B-12 (CYANOCOBALAMIN) 1000 MCG tablet Take 1,000 mcg by mouth daily.      [name] apixaban (ELIQUIS) 2.5 MG TABS tablet Take 1 tablet (2.5 mg total) by mouth 2 (two) times daily. 180 tablet 3   No current facility-administered medications for this visit.     PHYSICAL EXAMINATION: ECOG PERFORMANCE STATUS: 0 - Asymptomatic  Today's  Vitals   08/20/18 1056 08/20/18 1057  BP: (!) 141/79   Pulse: 85   Resp: 18   Temp: 98.2 F (36.8 C)   TempSrc: Oral   SpO2: 99%   Weight: 194 lb (88 kg)   Height: '5\' 1"'$  (1.549 m)   PainSc: 0-No pain 0-No pain   Body mass index is 36.66 kg/m.  Filed Weights   08/20/18 1056  Weight: 194 lb (88 kg)    GENERAL: alert, no distress and comfortable SKIN: skin color, texture, turgor are normal, no rashes or significant lesions EYES: conjunctiva are pink and non-injected, sclera clear OROPHARYNX: no exudate, no erythema; lips, buccal mucosa, and tongue normal  NECK: supple, non-tender LUNGS: clear to auscultation with normal breathing effort HEART: regular rate & rhythm and no murmurs and no lower extremity edema ABDOMEN: soft, non-tender, non-distended, normal bowel sounds Musculoskeletal: no cyanosis of digits and no clubbing  PSYCH: alert & oriented x 3, fluent speech NEURO: no focal motor/sensory deficits  LABORATORY DATA:  I have reviewed the data as listed    Component Value Date/Time   NA 137 08/20/2018 1023   K 4.5 08/20/2018 1023   CL 101 08/20/2018 1023   CO2 28 08/20/2018 1023   GLUCOSE 117 (H) 08/20/2018 1023   GLUCOSE 74 05/17/2006 0957   BUN 8 08/20/2018 1023   CREATININE 0.97 08/20/2018 1023   CALCIUM 9.9 08/20/2018 1023   PROT 8.4 (H) 08/20/2018 1023   ALBUMIN 3.9 08/20/2018 1023   AST 13 (L) 08/20/2018 1023   ALT 9 08/20/2018 1023   ALKPHOS 71 08/20/2018 1023   BILITOT 0.5 08/20/2018 1023   GFRNONAA >60 08/20/2018 1023   GFRAA >60 08/20/2018 1023    No results found for: SPEP, UPEP  Lab Results  Component Value Date   WBC 9.4 08/20/2018   NEUTROABS 6.1 08/20/2018   HGB 10.6 (L) 08/20/2018   HCT 33.2 (L) 08/20/2018   MCV 85.1 08/20/2018   PLT 389 08/20/2018      Chemistry      Component Value Date/Time   NA 137 08/20/2018 1023   K 4.5 08/20/2018 1023   CL 101 08/20/2018 1023   CO2 28 08/20/2018 1023   BUN 8 08/20/2018 1023    CREATININE 0.97 08/20/2018 1023      Component Value Date/Time   CALCIUM 9.9  08/20/2018 1023   ALKPHOS 71 08/20/2018 1023   AST 13 (L) 08/20/2018 1023   ALT 9 08/20/2018 1023   BILITOT 0.5 08/20/2018 1023

## 2018-08-21 ENCOUNTER — Other Ambulatory Visit: Payer: Self-pay | Admitting: *Deleted

## 2018-08-21 DIAGNOSIS — I2699 Other pulmonary embolism without acute cor pulmonale: Secondary | ICD-10-CM

## 2018-08-21 DIAGNOSIS — E1169 Type 2 diabetes mellitus with other specified complication: Secondary | ICD-10-CM

## 2018-08-21 DIAGNOSIS — E669 Obesity, unspecified: Principal | ICD-10-CM

## 2018-08-21 MED ORDER — APIXABAN 2.5 MG PO TABS: 2.5000 mg | ORAL_TABLET | Freq: Two times a day (BID) | ORAL | 3 refills | Status: DC

## 2018-08-22 NOTE — Assessment & Plan Note (Signed)
On vitamin B12 

## 2018-08-22 NOTE — Assessment & Plan Note (Signed)
Declined statins in the past

## 2018-08-22 NOTE — Assessment & Plan Note (Signed)
Wt Readings from Last 3 Encounters:  08/20/18 194 lb (88 kg)  08/19/18 196 lb (88.9 kg)  06/20/18 208 lb 1.9 oz (94.4 kg)  Losing weight on diet.  Intermittent fasting suggested

## 2018-08-22 NOTE — Assessment & Plan Note (Signed)
On metformin and Toujeo

## 2018-08-22 NOTE — Assessment & Plan Note (Signed)
Sierra Johnson is medically clear for right total knee replacement.  Please discontinue and resume Eliquis per your protocol.  She will have to continue Eliquis indefinitely for her history of unprovoked PE.  Use of inhalers perioperatively.  Monitor blood sugars perioperatively.  Her blood tests are pending.  She was given a Z-Pak today for acute sinusitis.  Thank you!

## 2018-08-22 NOTE — Assessment & Plan Note (Signed)
On a lifetime anticoagulation

## 2018-09-17 ENCOUNTER — Encounter: Payer: Medicare Other | Admitting: Internal Medicine

## 2018-09-19 ENCOUNTER — Other Ambulatory Visit: Payer: Self-pay | Admitting: Internal Medicine

## 2018-09-19 MED ORDER — PROMETHAZINE-CODEINE 6.25-10 MG/5ML PO SYRP: 5.0000 mL | ORAL_SOLUTION | ORAL | 0 refills | Status: DC | PRN

## 2018-09-23 MED ORDER — ACCU-CHEK GUIDE W/DEVICE KIT: 1.0000 | PACK | Freq: Two times a day (BID) | 0 refills | Status: AC

## 2018-09-23 MED ORDER — GLUCOSE BLOOD VI STRP: ORAL_STRIP | 3 refills | Status: DC

## 2018-09-23 MED ORDER — ACCU-CHEK MULTICLIX LANCETS MISC: 3 refills | Status: DC

## 2018-09-25 ENCOUNTER — Telehealth: Payer: Self-pay | Admitting: Internal Medicine

## 2018-09-25 MED ORDER — GLUCOSE BLOOD VI STRP: ORAL_STRIP | 3 refills | Status: DC

## 2018-09-25 MED ORDER — ACCU-CHEK MULTICLIX LANCETS MISC: 3 refills | Status: DC

## 2018-09-25 NOTE — Telephone Encounter (Signed)
Resent rx to [facility] 

## 2018-09-25 NOTE — Telephone Encounter (Signed)
Copied from [place] [phone]. Topic: Quick Communication - Rx Refill/Question >> Sep 25, 2018 11:22 AM Celene Kras A wrote: Medication: glucose blood (ACCU-CHEK GUIDE) test strip [[id number]]   Has the patient contacted their pharmacy? Yes.  Tanyifor from [facility] called requesting another refill request be sent in for pt with a diagnoses code. Please advise.  (Agent: If no, request that the patient contact the pharmacy for the refill.) (Agent: If yes, when and what did the pharmacy advise?)  Preferred Pharmacy (with phone number or street name): [facility] [place], [place] [address] Phone: [phone] Fax: [phone] Not a 24 hour pharmacy; exact hours not known.    Agent: Please be advised that RX refills may take up to 3 business days. We ask that you follow-up with your pharmacy.

## 2018-09-27 ENCOUNTER — Telehealth: Payer: Self-pay | Admitting: Internal Medicine

## 2018-09-27 NOTE — Telephone Encounter (Signed)
Copied from [facility] [phone]. Topic: Quick Communication - Rx Refill/Question >> Sep 27, 2018 10:59 AM Rayann Heman wrote: Medication: glucose blood (ACCU-CHEK GUIDE) test strip [[id number]] pharmacy called and stated that they need diagnoses code for rx

## 2018-09-30 NOTE — Telephone Encounter (Signed)
Pharmacy is closed at this time, Dx Code is E11.69

## 2018-09-30 NOTE — Telephone Encounter (Signed)
LM for pharmacy with DX code

## 2018-10-03 ENCOUNTER — Other Ambulatory Visit: Payer: Self-pay

## 2018-10-03 MED ORDER — ACCU-CHEK FASTCLIX LANCETS MISC: 0 refills | Status: DC

## 2018-10-07 ENCOUNTER — Inpatient Hospital Stay (HOSPITAL_COMMUNITY): Admission: RE | Admit: 2018-10-07 | Payer: Medicare Other | Source: Home / Self Care | Admitting: Orthopedic Surgery

## 2018-10-07 ENCOUNTER — Encounter (HOSPITAL_COMMUNITY): Admission: RE | Payer: Self-pay | Source: Home / Self Care

## 2018-10-07 SURGERY — ARTHROPLASTY, KNEE, TOTAL
Anesthesia: Choice | Laterality: Right

## 2018-10-09 ENCOUNTER — Telehealth: Payer: Self-pay | Admitting: Internal Medicine

## 2018-10-09 MED ORDER — ACCU-CHEK FASTCLIX LANCETS MISC: 0 refills | Status: AC

## 2018-10-09 NOTE — Telephone Encounter (Signed)
Pharmacy stated to give them a call because they need to discuss the instructions for the patients lancets.

## 2018-10-09 NOTE — Telephone Encounter (Signed)
RX resent

## 2018-10-09 NOTE — Addendum Note (Signed)
Addended by: Karren Cobble on: 10/09/2018 04:15 PM   Modules accepted: Orders

## 2018-11-06 ENCOUNTER — Encounter: Payer: Self-pay | Admitting: *Deleted

## 2018-11-07 ENCOUNTER — Other Ambulatory Visit: Payer: Self-pay

## 2018-11-07 ENCOUNTER — Ambulatory Visit (INDEPENDENT_AMBULATORY_CARE_PROVIDER_SITE_OTHER): Payer: Medicare Other | Admitting: Internal Medicine

## 2018-11-07 ENCOUNTER — Encounter: Payer: Self-pay | Admitting: Internal Medicine

## 2018-11-07 VITALS — Ht 60.0 in | Wt 196.0 lb

## 2018-11-07 DIAGNOSIS — D509 Iron deficiency anemia, unspecified: Secondary | ICD-10-CM

## 2018-11-07 DIAGNOSIS — Z7901 Long term (current) use of anticoagulants: Secondary | ICD-10-CM

## 2018-11-07 DIAGNOSIS — Z86711 Personal history of pulmonary embolism: Secondary | ICD-10-CM | POA: Diagnosis not present

## 2018-11-07 NOTE — Progress Notes (Signed)
Patient ID: Sierra Johnson, female   DOB: [birth year], 67 y.o.   MRN: [id number]  This service was provided via telemedicine.  Telephone visit The patient was located at home The provider was located in provider's GI office. The patient did consent to this telephone visit and is aware of possible charges through their insurance for this visit.   The persons participating in this telemedicine service were the patient and I. Time spent on call: 20 min  HPI: Sierra Johnson is a 68 year old female with a past medical history of chronic normocytic anemia with borderline low iron level, unprovoked pulmonary embolism in September 2019 on Eliquis, diabetes and osteoarthritis who is seen in consult at the request of Dr. Maylon Peppers for her progressive borderline iron deficiency anemia.  She is seen virtually today in the setting of COVID-19 pandemic.  She reports that she has had chronically low iron levels and anemia.  She did have an unprovoked pulmonary embolism in September 2019 and has been on Eliquis.  Her hemoglobin has declined slowly and is followed by hematology.  She does not take iron because this causes constipation and she does not recall ever having received IV iron therapy.  She denies specific GI complaint.  Her bowel movements are regular.  She denies seeing visible blood in her stool or melena.  No upper GI or hepatobiliary complaint.  No nausea or vomiting.  No dysphagia or odynophagia.  No family history of GI tract malignancy.  She had a colonoscopy in 2008 with Dr. Sharlett Iles which was unremarkable.  She then had a negative Cologuard in January 2020  She recalls during her last colonoscopy that they had a difficult time obtaining an IV and this was quite traumatic for her.  Past Medical History:  Diagnosis Date  . ANEMIA-NOS 04/24/2007  . Arthritis   . DIABETES MELLITUS, TYPE II 04/24/2007  . Elevated glucose 2011  . HYPERLIPIDEMIA 07/17/2007   mild  . HYPERTENSION 04/24/2007  . Menopause  1998   Dr. Gaetano Net  . OBSTRUCTIVE SLEEP APNEA 09/30/2009   on CPAP 2011  . VITAMIN B12 DEFICIENCY 04/24/2007    Past Surgical History:  Procedure Laterality Date  . ABDOMINAL HYSTERECTOMY    . HERNIA REPAIR  11/11   Incar. midline hernia  . INCISIONAL HERNIA REPAIR N/A 06/03/2014   Procedure: LAPAROSCOPIC REPAIR RECURRENT  INCISIONAL HERNIA;  Surgeon: Georganna Skeans, MD;  Location: [facility];  Service: General;  Laterality: N/A;  . INSERTION OF MESH N/A 06/03/2014   Procedure: INSERTION OF MESH;  Surgeon: Georganna Skeans, MD;  Location: [facility];  Service: General;  Laterality: N/A;    Outpatient Medications Prior to Visit  Medication Sig Dispense Refill  . Accu-Chek FastClix Lancets MISC USE TO CHECK BLOOD SUGARS TWICE A DAY 102 each 0  . albuterol (PROVENTIL HFA;VENTOLIN HFA) 108 (90 Base) MCG/ACT inhaler Inhale 2 puffs into the lungs every 4 (four) hours as needed for wheezing or shortness of breath. Dispense with aerochamber 1 Inhaler 0  . apixaban (ELIQUIS) 2.5 MG TABS tablet Take 1 tablet (2.5 mg total) by mouth 2 (two) times daily. 180 tablet 3  . Blood Glucose Monitoring Suppl (ACCU-CHEK GUIDE) w/Device KIT 1 each by Does not apply route 2 (two) times daily. DX: E11.9 1 kit 0  . Cholecalciferol (VITAMIN D3) 1000 UNITS CAPS Take 1,000 Units by mouth daily.     . fish oil-omega-3 fatty acids 1000 MG capsule Take 1 g by mouth daily.     . fluticasone (  FLONASE) 50 MCG/ACT nasal spray Place 2 sprays into both nostrils daily. (Patient taking differently: Place 2 sprays into both nostrils as needed. ) 16 g 6  . glucose blood (ACCU-CHEK GUIDE) test strip UUSE TO CHECK BLOOD SUGARS TWICE A DAY 200 each 3  . glucose monitoring kit (FREESTYLE) monitoring kit 1 each by Does not apply route 2 (two) times daily. Dx: E11.9, I10 1 each 0  . Insulin Pen Needle 31G X 5 MM MISC 1 Units by Does not apply route 4 (four) times daily. 50 each 11  . ipratropium-albuterol (DUONEB) 0.5-2.5 (3) MG/3ML SOLN Take 3  mLs by nebulization every 6 (six) hours as needed. (Patient taking differently: Take 3 mLs by nebulization every 6 (six) hours as needed (for shortness of breath or wheezing). ) 360 mL 1  . loratadine (CLARITIN) 10 MG tablet Take 1 tablet (10 mg total) by mouth daily. 30 tablet 11  . MAGNESIUM PO Take 1 tablet by mouth 2 (two) times daily.    . metFORMIN (GLUCOPHAGE) 500 MG tablet TAKE 1 TABLET BY MOUTH 3 TIMES DAILY (Patient taking differently: Take 500 mg by mouth 2 (two) times daily with a meal. ) 270 tablet 3  . Methylsulfonylmethane (MSM PO) Take 1 tablet by mouth 2 (two) times daily.    [name] Spacer/Aero-Holding Chambers (AEROCHAMBER PLUS) inhaler Use as instructed 1 each 2  . TOUJEO SOLOSTAR 300 UNIT/ML SOPN INJECT 18 UNITS INTO THE SKIN EVERY MORNING. (Patient taking differently: 18 Units as needed. Blood sugars over 110) 13.5 pen 9  . vitamin B-12 (CYANOCOBALAMIN) 1000 MCG tablet Take 1,000 mcg by mouth daily.      [name] azithromycin (ZITHROMAX Z-PAK) 250 MG tablet As directed 6 tablet 0  . docusate sodium (COLACE) 100 MG capsule Take 2 capsules (200 mg total) by mouth 2 (two) times daily. 10 capsule 0  . guaiFENesin (ROBITUSSIN) 100 MG/5ML SOLN Take 5 mLs (100 mg total) by mouth every 4 (four) hours as needed for cough or to loosen phlegm. 1200 mL 0  . guaiFENesin-codeine 100-10 MG/5ML syrup Take 5 mLs by mouth every 4 (four) hours as needed for cough. 300 mL 0  . polyethylene glycol (MIRALAX / GLYCOLAX) packet Take 17 g by mouth 2 (two) times daily. 14 each 0  . promethazine-codeine (PHENERGAN WITH CODEINE) 6.25-10 MG/5ML syrup Take 5 mLs by mouth every 4 (four) hours as needed. 300 mL 0   No facility-administered medications prior to visit.     Allergies  Allergen Reactions  . Benazepril Swelling and Other (See Comments)    Lip swelling  . Amlodipine Swelling and Other (See Comments)    LE swelling on 10 mg  . Tetracycline Nausea And Vomiting    Family History  Problem Relation Age of  Onset  . Heart disease Mother 75       ?CAD  . Lung cancer Father   . Heart disease Brother 15       CAD  . Hypertension Other   . Colon cancer Neg Hx   . Esophageal cancer Neg Hx   . Stomach cancer Neg Hx   . Pancreatic cancer Neg Hx   . Liver disease Neg Hx     Social History   Tobacco Use  . Smoking status: Never Smoker  . Smokeless tobacco: Never Used  Substance Use Topics  . Alcohol use: No  . Drug use: No    ROS: As per history of present illness, otherwise negative  Ht 5' (1.524  m)   Wt 196 lb (88.9 kg)   BMI 38.28 kg/m  No physical exam, virtual visit RELEVANT LABS AND IMAGING: CBC    Component Value Date/Time   WBC 9.4 08/20/2018 1023   WBC 8.2 03/05/2018 1146   RBC 3.90 08/20/2018 1023   HGB 10.6 (L) 08/20/2018 1023   HCT 33.2 (L) 08/20/2018 1023   PLT 389 08/20/2018 1023   MCV 85.1 08/20/2018 1023   MCH 27.2 08/20/2018 1023   MCHC 31.9 08/20/2018 1023   RDW 16.2 (H) 08/20/2018 1023   LYMPHSABS 2.3 08/20/2018 1023   MONOABS 0.8 08/20/2018 1023   EOSABS 0.2 08/20/2018 1023   BASOSABS 0.0 08/20/2018 1023    CMP     Component Value Date/Time   NA 137 08/20/2018 1023   K 4.5 08/20/2018 1023   CL 101 08/20/2018 1023   CO2 28 08/20/2018 1023   GLUCOSE 117 (H) 08/20/2018 1023   GLUCOSE 74 05/17/2006 0957   BUN 8 08/20/2018 1023   CREATININE 0.97 08/20/2018 1023   CALCIUM 9.9 08/20/2018 1023   PROT 8.4 (H) 08/20/2018 1023   ALBUMIN 3.9 08/20/2018 1023   AST 13 (L) 08/20/2018 1023   ALT 9 08/20/2018 1023   ALKPHOS 71 08/20/2018 1023   BILITOT 0.5 08/20/2018 1023   GFRNONAA >60 08/20/2018 1023   GFRAA >60 08/20/2018 1023   Iron/TIBC/Ferritin/ %Sat    Component Value Date/Time   IRON 57 03/26/2018 0946   TIBC 296 03/26/2018 0946   FERRITIN 82 03/26/2018 0946   IRONPCTSAT 19 (L) 03/26/2018 0946     ASSESSMENT/PLAN:  68 year old female with a past medical history of chronic normocytic anemia with borderline low iron level, unprovoked  pulmonary embolism in September 2019 on Eliquis, diabetes and osteoarthritis who is seen in consult at the request of Dr. Maylon Peppers for her progressive borderline iron deficiency anemia.  1.  Normocytic anemia with borderline iron studies --we discussed the normal GI evaluation for a low iron anemia.  This is also been recommended by Dr. Maylon Peppers, her hematologist.  Her unprovoked PE also raises the question of an occult malignancy.  We discussed this at length today including the risks, benefits and alternatives and after our discussion she is agreeable and wishes to proceed --Please schedule upper endoscopy and colonoscopy in the [facility] --Please be aware that in 2008 we had a difficult time obtaining an IV, she remembers this vividly and wishes to avoid multiple sticks if possible.  I recommend and request special attention to this issue when she presents for procedures  2.  Unprovoked PE --on Eliquis, we already have permission from hematology based on the note 08/20/2018 to hold Eliquis 48 hours before procedure. --Hold Eliquis 48 hours before procedures  Will hold Eliquis 2 days prior to endoscopic procedures - will instruct when and how to resume after procedure. Benefits and risks of procedure explained including risks of bleeding, perforation, infection, missed lesions, reactions to medications and possible need for hospitalization and surgery for complications. Additional rare but real risk of stroke or other vascular clotting events off Eliquis also explained and need to seek urgent help if any signs of these problems occur.       YH:[facility], [address], [address], [place] [id number]

## 2018-11-12 ENCOUNTER — Encounter: Payer: Self-pay | Admitting: *Deleted

## 2018-11-12 ENCOUNTER — Telehealth: Payer: Self-pay | Admitting: *Deleted

## 2018-11-12 MED ORDER — SUPREP BOWEL PREP KIT 17.5-3.13-1.6 GM/177ML PO SOLN: 1.0000 | ORAL | 0 refills | Status: DC

## 2018-11-12 NOTE — Telephone Encounter (Signed)
error 

## 2018-11-12 NOTE — Patient Instructions (Addendum)
You have been scheduled for an endoscopy and colonoscopy on 11/18/18 at [facility]. Please arrive at 8:00 am for a 9:00 am procedure. Go to the 4th floor of Dr Vena Rua office.  Hold Eliquis 48 hours prior to your test.  Hold Metformin and Toujeo the morning of your appointment.

## 2018-11-12 NOTE — Addendum Note (Signed)
Addended by: Larina Bras on: 11/12/2018 01:13 PM   Modules accepted: Orders

## 2018-11-15 ENCOUNTER — Telehealth: Payer: Self-pay | Admitting: *Deleted

## 2018-11-15 NOTE — Telephone Encounter (Signed)
Covid-19 screening questions  Have you traveled in the last 14 days? no If yes where?  Do you now or have you had a fever in the last 14 days? no  Do you have any respiratory symptoms of shortness of breath or cough now or in the last 14 days? no  Do you have any family members or close contacts with diagnosed or suspected Covid-19 in the past 14 days? no  Have you been tested for Covid-19 and found to be positive? No  Pt is aware that care partner will be waiting in the car during procedure.  She will wear a mask into the buildling

## 2018-11-18 ENCOUNTER — Ambulatory Visit (AMBULATORY_SURGERY_CENTER): Payer: Medicare Other | Admitting: Internal Medicine

## 2018-11-18 ENCOUNTER — Other Ambulatory Visit: Payer: Self-pay

## 2018-11-18 ENCOUNTER — Encounter: Payer: Self-pay | Admitting: Internal Medicine

## 2018-11-18 VITALS — BP 163/91 | HR 71 | Temp 98.4°F | Resp 13 | Ht 60.0 in | Wt 196.0 lb

## 2018-11-18 DIAGNOSIS — K5730 Diverticulosis of large intestine without perforation or abscess without bleeding: Secondary | ICD-10-CM

## 2018-11-18 DIAGNOSIS — K3189 Other diseases of stomach and duodenum: Secondary | ICD-10-CM | POA: Diagnosis not present

## 2018-11-18 DIAGNOSIS — D509 Iron deficiency anemia, unspecified: Secondary | ICD-10-CM

## 2018-11-18 DIAGNOSIS — K648 Other hemorrhoids: Secondary | ICD-10-CM

## 2018-11-18 DIAGNOSIS — E119 Type 2 diabetes mellitus without complications: Secondary | ICD-10-CM | POA: Diagnosis not present

## 2018-11-18 DIAGNOSIS — G4733 Obstructive sleep apnea (adult) (pediatric): Secondary | ICD-10-CM | POA: Diagnosis not present

## 2018-11-18 MED ORDER — SODIUM CHLORIDE 0.9 % IV SOLN: 500.0000 mL | Freq: Once | INTRAVENOUS | Status: DC

## 2018-11-18 NOTE — Op Note (Signed)
[name] Procedure Date: 11/18/2018 9:13 AM MRN: [id number] Endoscopist: Jerene Bears , MD Age: 68 Referring MD:  Date of Birth: [birth year] Gender: Female Account #: [id number] Procedure:                Upper GI endoscopy Indications:              Iron deficiency anemia, history of unprovoked PE Medicines:                Monitored Anesthesia Care Procedure:                Pre-Anesthesia Assessment:                           - Prior to the procedure, a History and Physical                            was performed, and patient medications and                            allergies were reviewed. The patient's tolerance of                            previous anesthesia was also reviewed. The risks                            and benefits of the procedure and the sedation                            options and risks were discussed with the patient.                            All questions were answered, and informed consent                            was obtained. Prior Anticoagulants: The patient has                            taken Eliquis (apixaban), last dose was 2 days                            prior to procedure. ASA Grade Assessment: III - A                            patient with severe systemic disease. After                            reviewing the risks and benefits, the patient was                            deemed in satisfactory condition to undergo the                            procedure.  After obtaining informed consent, the endoscope was                            passed under direct vision. Throughout the                            procedure, the patient's blood pressure, pulse, and                            oxygen saturations were monitored continuously. The                            Endoscope was introduced through the mouth, and                            advanced to the second part of duodenum. The  upper                            GI endoscopy was accomplished without difficulty.                            The patient tolerated the procedure well. Scope In: Scope Out: Findings:                 A 2 cm hiatal hernia was present.                           Normal mucosa was found in the entire esophagus.                           The entire examined stomach was normal. Biopsies                            were taken with a cold forceps for histology and                            Helicobacter pylori testing.                           The examined duodenum was normal. Biopsies for                            histology were taken with a cold forceps for                            evaluation of celiac disease. Complications:            No immediate complications. Estimated Blood Loss:     Estimated blood loss was minimal. Impression:               - 2 cm hiatal hernia.                           - Normal mucosa was found in the entire esophagus.                           -  Normal stomach. Biopsied.                           - Normal examined duodenum. Biopsied. Recommendation:           - Patient has a contact number available for                            emergencies. The signs and symptoms of potential                            delayed complications were discussed with the                            patient. Return to normal activities tomorrow.                            Written discharge instructions were provided to the                            patient.                           - Resume previous diet.                           - Continue present medications.                           - Await pathology results.                           - See the other procedure note for documentation of                            additional recommendations. Jerene Bears, MD 11/18/2018 9:47:26 AM This report has been signed electronically.

## 2018-11-18 NOTE — Progress Notes (Signed)
Courtney Washington took temp and Sarah Monday took vitals. 

## 2018-11-18 NOTE — Progress Notes (Signed)
Report to PACU, RN, vss, BBS= Clear.  

## 2018-11-18 NOTE — Progress Notes (Signed)
Pt's states no medical or surgical changes since previsit or office visit. 

## 2018-11-18 NOTE — Progress Notes (Signed)
Called to room to assist during endoscopic procedure.  Patient ID and intended procedure confirmed with present staff. Received instructions for my participation in the procedure from the performing physician.  

## 2018-11-18 NOTE — Patient Instructions (Signed)
Thank you for allowing [place] to care for you today!  Await pathology results by mail, approximately 2 weeks.  Recommend next screening colonoscopy in 10 years.  Resume Eliquis today at prior dose.  Also resume other medications and diet today.  Return to normal activities tomorrow.      YOU HAD AN ENDOSCOPIC PROCEDURE TODAY AT [facility]:   Refer to the procedure report that was given to you for any specific questions about what was found during the examination.  If the procedure report does not answer your questions, please call your gastroenterologist to clarify.  If you requested that your care partner not be given the details of your procedure findings, then the procedure report has been included in a sealed envelope for you to review at your convenience later.  YOU SHOULD EXPECT: Some feelings of bloating in the abdomen. Passage of more gas than usual.  Walking can help get rid of the air that was put into your GI tract during the procedure and reduce the bloating. If you had a lower endoscopy (such as a colonoscopy or flexible sigmoidoscopy) you may notice spotting of blood in your stool or on the toilet paper. If you underwent a bowel prep for your procedure, you may not have a normal bowel movement for a few days.  Please Note:  You might notice some irritation and congestion in your nose or some drainage.  This is from the oxygen used during your procedure.  There is no need for concern and it should clear up in a day or so.  SYMPTOMS TO REPORT IMMEDIATELY:   Following lower endoscopy (colonoscopy or flexible sigmoidoscopy):  Excessive amounts of blood in the stool  Significant tenderness or worsening of abdominal pains  Swelling of the abdomen that is new, acute  Fever of 100F or higher   Following upper endoscopy (EGD)  Vomiting of blood or coffee ground material  New chest pain or pain under the shoulder blades  Painful or persistently difficult  swallowing  New shortness of breath  Fever of 100F or higher  Black, tarry-looking stools  For urgent or emergent issues, a gastroenterologist can be reached at any hour by calling [phone].   DIET:  We do recommend a small meal at first, but then you may proceed to your regular diet.  Drink plenty of fluids but you should avoid alcoholic beverages for 24 hours.  ACTIVITY:  You should plan to take it easy for the rest of today and you should NOT DRIVE or use heavy machinery until tomorrow (because of the sedation medicines used during the test).    FOLLOW UP: Our staff will call the number listed on your records 48-72 hours following your procedure to check on you and address any questions or concerns that you may have regarding the information given to you following your procedure. If we do not reach you, we will leave a message.  We will attempt to reach you two times.  During this call, we will ask if you have developed any symptoms of COVID 19. If you develop any symptoms (ie: fever, flu-like symptoms, shortness of breath, cough etc.) before then, please call [phone].  If you test positive for Covid 19 in the 2 weeks post procedure, please call and report this information to [place].    If any biopsies were taken you will be contacted by phone or by letter within the next 1-3 weeks.  Please call us at [phone] if  you have not heard about the biopsies in 3 weeks.    SIGNATURES/CONFIDENTIALITY: You and/or your care partner have signed paperwork which will be entered into your electronic medical record.  These signatures attest to the fact that that the information above on your After Visit Summary has been reviewed and is understood.  Full responsibility of the confidentiality of this discharge information lies with you and/or your care-partner.

## 2018-11-18 NOTE — Op Note (Addendum)
[facility] Patient Name: Sierra Johnson Procedure Date: 11/18/2018 9:12 AM MRN: [id number] Endoscopist: Jerene Bears , MD Age: 68 Referring MD:  Date of Birth: 1951/02/11 Gender: Female Account #: [id number] Procedure:                Colonoscopy Indications:              Iron deficiency anemia Medicines:                Monitored Anesthesia Care Procedure:                Pre-Anesthesia Assessment:                           - Prior to the procedure, a History and Physical                            was performed, and patient medications and                            allergies were reviewed. The patient's tolerance of                            previous anesthesia was also reviewed. The risks                            and benefits of the procedure and the sedation                            options and risks were discussed with the patient.                            All questions were answered, and informed consent                            was obtained. Prior Anticoagulants: The patient has                            taken Eliquis (apixaban), last dose was 2 days                            prior to procedure. ASA Grade Assessment: III - A                            patient with severe systemic disease. After                            reviewing the risks and benefits, the patient was                            deemed in satisfactory condition to undergo the                            procedure.  After obtaining informed consent, the colonoscope                            was passed under direct vision. Throughout the                            procedure, the patient's blood pressure, pulse, and                            oxygen saturations were monitored continuously. The                            Colonoscope was introduced through the anus and                            advanced to the cecum, identified by appendiceal   orifice and ileocecal valve. The colonoscopy was                            performed without difficulty. The patient tolerated                            the procedure well. The quality of the bowel                            preparation was good. The ileocecal valve,                            appendiceal orifice, and rectum were photographed. Scope In: 9:26:35 AM Scope Out: 9:39:45 AM Scope Withdrawal Time: 0 hours 8 minutes 8 seconds  Total Procedure Duration: 0 hours 13 minutes 10 seconds  Findings:                 The digital rectal exam was normal.                           Multiple small-mouthed diverticula were found in                            the sigmoid colon, ascending colon and cecum.                           Internal hemorrhoids were found during                            retroflexion. The hemorrhoids were small.                           The exam was otherwise without abnormality. Complications:            No immediate complications. Impression:               - Mild diverticulosis in the sigmoid colon, in the                            ascending colon and in  the cecum.                           - Small internal hemorrhoids.                           - The examination was otherwise normal.                           - No specimens collected. Recommendation:           - Patient has a contact number available for                            emergencies. The signs and symptoms of potential                            delayed complications were discussed with the                            patient. Return to normal activities tomorrow.                            Written discharge instructions were provided to the                            patient.                           - Resume previous diet.                           - Resume Eliquis (apixaban) at prior dose today.                            Refer to managing physician for further adjustment                             of therapy.                           - Repeat colonoscopy in 10 years for screening                            purposes.                           - If pathology results from EGD unrevealing then                            proceed with video capsule endoscopy to complete                            the evaluation of iron deficiency anemia. Jerene Bears, MD 11/18/2018 9:50:38 AM This report has been signed electronically.

## 2018-11-19 DIAGNOSIS — M1711 Unilateral primary osteoarthritis, right knee: Secondary | ICD-10-CM | POA: Diagnosis not present

## 2018-11-20 ENCOUNTER — Telehealth: Payer: Self-pay

## 2018-11-20 NOTE — Telephone Encounter (Signed)
  Follow up Call-  Call back number 11/18/2018  Post procedure Call Back phone  # [phone]  Permission to leave phone message Yes  Some recent data might be hidden     Patient questions:  Do you have a fever, pain , or abdominal swelling? No. Pain Score  0 *  Have you tolerated food without any problems? Yes.    Have you been able to return to your normal activities? Yes.    Do you have any questions about your discharge instructions: Diet   No. Medications  No. Follow up visit  No.  Do you have questions or concerns about your Care? No.  Actions: * If pain score is 4 or above: No action needed, pain <4.   1. Have you developed a fever since your procedure? no  2.   Have you had an respiratory symptoms (SOB or cough) since your procedure? no  3.   Have you tested positive for COVID 19 since your procedure no  4.   Have you had any family members/close contacts diagnosed with the COVID 19 since your procedure?  no   If yes to any of these questions please route to Joylene John, RN and Alphonsa Gin, Therapist, sports.

## 2018-11-20 NOTE — H&P (Signed)
TOTAL KNEE ADMISSION H&P  Patient is being admitted for right total knee arthroplasty.  Subjective:  Chief Complaint:right knee pain.  HPI: Sierra Johnson, 68 y.o. female, has a history of pain and functional disability in the right knee due to arthritis and has failed non-surgical conservative treatments for greater than 12 weeks to includecorticosteriod injections, viscosupplementation injections and activity modification.  Onset of symptoms was gradual, starting 8 years ago with gradually worsening course since that time. The patient noted prior procedures on the knee to include  arthroscopy on the right knee(s).  Patient currently rates pain in the right knee(s) at 10 out of 10 with activity. Patient has worsening of pain with activity and weight bearing, pain that interferes with activities of daily living, crepitus and joint swelling.  Patient has evidence of bone-on-bone arthritis in the medial and patellofemoral compartments of the right knee with significant varus deformity by imaging studies. There is no active infection.  Patient Active Problem List   Diagnosis Date Noted  . Preop exam for internal medicine 08/19/2018  . Allergic rhinitis 08/19/2018  . Hypoxemia 03/05/2018  . Acute pulmonary embolism ([place]) 02/28/2018  . Pulmonary embolus, right ([place]) 02/28/2018  . Hyponatremia 02/28/2018  . Cough 02/21/2018  . Fever and chills 01/30/2018  . Headache 01/30/2018  . Asthmatic bronchitis 01/18/2018  . Upper respiratory infection 04/27/2017  . Insomnia 03/08/2016  . Eczema 04/13/2015  . Edema 02/11/2015  . S/P laparoscopic hernia repair 06/03/2014  . Knee osteoarthritis 04/30/2014  . Recurrent ventral incisional hernia 10/16/2012  . S/P repair of ventral hernia 08/28/2012  . Diabetes mellitus type 2 in obese ([place]) 05/07/2012  . Morbid obesity ([place]) 01/18/2012  . Well adult exam 04/17/2011  . Sinusitis, acute 04/17/2011  . SHOULDER PAIN 12/07/2009  . POLYURIA 12/07/2009  .  Obstructive sleep apnea 09/30/2009  . FATIGUE 09/15/2009  . PARESTHESIA 06/08/2008  . Dyslipidemia 07/17/2007  . B12 deficiency 04/24/2007  . Anemia 04/24/2007  . Essential hypertension 04/24/2007   Past Medical History:  Diagnosis Date  . ANEMIA-NOS 04/24/2007  . Arthritis   . DIABETES MELLITUS, TYPE II 04/24/2007  . Elevated glucose 2011  . HYPERLIPIDEMIA 07/17/2007   mild  . HYPERTENSION 04/24/2007  . Menopause 1998   Dr. Gaetano Net  . OBSTRUCTIVE SLEEP APNEA 09/30/2009   on CPAP 2011  . VITAMIN B12 DEFICIENCY 04/24/2007    Past Surgical History:  Procedure Laterality Date  . ABDOMINAL HYSTERECTOMY    . HERNIA REPAIR  11/11   Incar. midline hernia  . INCISIONAL HERNIA REPAIR N/A 06/03/2014   Procedure: LAPAROSCOPIC REPAIR RECURRENT  INCISIONAL HERNIA;  Surgeon: Georganna Skeans, MD;  Location: [name];  Service: General;  Laterality: N/A;  . INSERTION OF MESH N/A 06/03/2014   Procedure: INSERTION OF MESH;  Surgeon: Georganna Skeans, MD;  Location: [facility];  Service: General;  Laterality: N/A;    No current facility-administered medications for this encounter.    Current Outpatient Medications  Medication Sig Dispense Refill Last Dose  . Accu-Chek FastClix Lancets MISC USE TO CHECK BLOOD SUGARS TWICE A DAY (Patient not taking: Reported on 11/18/2018) 102 each 0 Not Taking  . albuterol (PROVENTIL HFA;VENTOLIN HFA) 108 (90 Base) MCG/ACT inhaler Inhale 2 puffs into the lungs every 4 (four) hours as needed for wheezing or shortness of breath. Dispense with aerochamber 1 Inhaler 0 Unknown  . apixaban (ELIQUIS) 2.5 MG TABS tablet Take 1 tablet (2.5 mg total) by mouth 2 (two) times daily. 180 tablet 3 Past Week  .  Blood Glucose Monitoring Suppl (ACCU-CHEK GUIDE) w/Device KIT 1 each by Does not apply route 2 (two) times daily. DX: E11.9 (Patient not taking: Reported on 11/18/2018) 1 kit 0 Not Taking  . Cholecalciferol (VITAMIN D3) 1000 UNITS CAPS Take 1,000 Units by mouth daily.    Past Week  . fish  oil-omega-3 fatty acids 1000 MG capsule Take 1 g by mouth daily.    Past Week  . fluticasone (FLONASE) 50 MCG/ACT nasal spray Place 2 sprays into both nostrils daily. (Patient taking differently: Place 2 sprays into both nostrils as needed. ) 16 g 6 Unknown  . glucose blood (ACCU-CHEK GUIDE) test strip UUSE TO CHECK BLOOD SUGARS TWICE A DAY (Patient not taking: Reported on 11/18/2018) 200 each 3 Not Taking  . glucose monitoring kit (FREESTYLE) monitoring kit 1 each by Does not apply route 2 (two) times daily. Dx: E11.9, I10 (Patient not taking: Reported on 11/18/2018) 1 each 0 Not Taking  . Insulin Pen Needle 31G X 5 MM MISC 1 Units by Does not apply route 4 (four) times daily. (Patient not taking: Reported on 11/18/2018) 50 each 11 Not Taking  . ipratropium-albuterol (DUONEB) 0.5-2.5 (3) MG/3ML SOLN Take 3 mLs by nebulization every 6 (six) hours as needed. (Patient not taking: Reported on 11/18/2018) 360 mL 1 Not Taking  . loratadine (CLARITIN) 10 MG tablet Take 1 tablet (10 mg total) by mouth daily. 30 tablet 11 11/17/2018  . MAGNESIUM PO Take 1 tablet by mouth 2 (two) times daily.   Past Week  . metFORMIN (GLUCOPHAGE) 500 MG tablet TAKE 1 TABLET BY MOUTH 3 TIMES DAILY (Patient taking differently: Take 500 mg by mouth 2 (two) times daily with a meal. ) 270 tablet 3 Past Week  . Methylsulfonylmethane (MSM PO) Take 1 tablet by mouth 2 (two) times daily.   Not Taking  . Spacer/Aero-Holding Chambers (AEROCHAMBER PLUS) inhaler Use as instructed (Patient not taking: Reported on 11/18/2018) 1 each 2 Not Taking  . TOUJEO SOLOSTAR 300 UNIT/ML SOPN INJECT 18 UNITS INTO THE SKIN EVERY MORNING. (Patient not taking: Blood sugars over 110) 13.5 pen 9 Not Taking  . vitamin B-12 (CYANOCOBALAMIN) 1000 MCG tablet Take 1,000 mcg by mouth daily.     Past Week   Allergies  Allergen Reactions  . Benazepril Swelling and Other (See Comments)    Lip swelling  . Amlodipine Swelling and Other (See Comments)    LE swelling on 10 mg   . Tetracycline Nausea And Vomiting    Social History   Tobacco Use  . Smoking status: Never Smoker  . Smokeless tobacco: Never Used  Substance Use Topics  . Alcohol use: No    Family History  Problem Relation Age of Onset  . Heart disease Mother 60       ?CAD  . Lung cancer Father   . Heart disease Brother 3       CAD  . Hypertension Other   . Colon cancer Neg Hx   . Esophageal cancer Neg Hx   . Stomach cancer Neg Hx   . Pancreatic cancer Neg Hx   . Liver disease Neg Hx      Review of Systems  Constitutional: Negative for chills and fever.  HENT: Negative for congestion, sore throat and tinnitus.   Eyes: Negative for double vision, photophobia and pain.  Respiratory: Negative for cough, shortness of breath and wheezing.   Cardiovascular: Negative for chest pain, palpitations and orthopnea.  Gastrointestinal: Negative for heartburn, nausea and vomiting.  Genitourinary: Negative for dysuria, frequency and urgency.  Musculoskeletal: Positive for joint pain.  Neurological: Negative for dizziness, weakness and headaches.    Objective:  Physical Exam  Well nourished and well developed.  General: Alert and oriented x3, cooperative and pleasant, no acute distress.  Head: normocephalic, atraumatic, neck supple.  Eyes: EOMI.  Respiratory: breath sounds clear in all fields, no wheezing, rales, or rhonchi. Cardiovascular: Regular rate and rhythm, no murmurs, gallops or rubs.  Abdomen: non-tender to palpation and soft, normoactive bowel sounds. Musculoskeletal:  Right Knee Exam:  No effusion.  Significant varus deformity. Range of motion is 0-125 degrees.  No crepitus on range of motion of the knee.  No medial joint line tenderness. No lateral joint line tenderness.  Slight pseudo-laxity when correcting varus alignment to neutral. No AP laxity. No gross instability.  Calves soft and nontender. Motor function intact in LE. Strength 5/5 LE bilaterally. Neuro: Distal  pulses 2+. Sensation to light touch intact in LE.  Vital signs in last 24 hours:  Blood pressure: 150/86 mmHg Pulse: 68 bpm  Labs:   Estimated body mass index is 38.28 kg/m as calculated from the following:   Height as of 11/18/18: 5' (1.524 m).   Weight as of 11/18/18: 88.9 kg.   Imaging Review Plain radiographs demonstrate severe degenerative joint disease of the right knee(s). The overall alignment issignificant varus. The bone quality appears to be adequate for age and reported activity level.   Assessment/Plan:  End stage arthritis, right knee   The patient history, physical examination, clinical judgment of the provider and imaging studies are consistent with end stage degenerative joint disease of the right knee(s) and total knee arthroplasty is deemed medically necessary. The treatment options including medical management, injection therapy arthroscopy and arthroplasty were discussed at length. The risks and benefits of total knee arthroplasty were presented and reviewed. The risks due to aseptic loosening, infection, stiffness, patella tracking problems, thromboembolic complications and other imponderables were discussed. The patient acknowledged the explanation, agreed to proceed with the plan and consent was signed. Patient is being admitted for inpatient treatment for surgery, pain control, PT, OT, prophylactic antibiotics, VTE prophylaxis, progressive ambulation and ADL's and discharge planning. The patient is planning to be discharged home.  Anticipated LOS equal to or greater than 2 midnights due to - Age 65 and older with one or more of the following:  - Obesity  - Expected need for hospital services (PT, OT, Nursing) required for safe  discharge  - Anticipated need for postoperative skilled nursing care or inpatient rehab  - Active co-morbidities: Diabetes and DVT/VTE OR   - Unanticipated findings during/Post Surgery: None  - Patient is a high risk of re-admission due  to: None  Therapy Plans: [facility]patient [facility] at [facility] Disposition: Home with husband Planned DVT Prophylaxis: Eliquis (takes 2.5 mg BID for hx PE) DME needed: Gilford Rile, 3-in-1 PCP: Dr. Alain Marion Hematologist: Dr. Maylon Peppers TXA: Topical (hx PE) Allergies: Tetracycline (nausea) Anesthesia Concerns: None BMI: 38.7 Last HgbA1c: 6.3%  - Patient was instructed on what medications to stop prior to surgery. - Follow-up visit in 2 weeks with Dr. Wynelle Link - Begin physical therapy following surgery - Pre-operative lab work as pre-surgical testing - Prescriptions will be provided in hospital at time of discharge  Theresa Duty, PA-C Orthopedic Surgery [facility] [place]

## 2018-11-25 NOTE — Progress Notes (Signed)
[facility] OFFICE PROGRESS NOTE  Patient Care Team: Plotnikov, [name], MD as PCP - Gaston Islam, MD as Consulting Physician (Orthopedic Surgery) [name], MD as Consulting Physician (Pulmonary Disease) Tish Men, MD as Consulting Physician (Hematology)  HEME/ONC OVERVIEW: 1. Unprovoked PTE of the RUL -02/2018: CTA chest for cough showed a RUL PTE; doppler negative for DVT in lower extremities -02/2018 - present: Eliquis, currently on 2.'5mg'$  BID for secondary ppx   TREATMENT REGIMEN:  Early 02/2018 - 08/2018: Eliquis '5mg'$  BID   08/2018 - present: Eliquis 2.'5mg'$  BID for seondary ppx   ASSESSMENT & PLAN:   Unprovoked PTE of the RUL -Patient is tolerating Eliquis 2.'5mg'$  BID well without any abnormal bleeding or bruising -Goal of anticoagulation is lifelong, given unprovoked VTE -Patient has had some financial difficulty with affording her medication since her retirement; unfortunately, her applications for medication assistance was declined by [employer]; I have referred the patient to our financial counselor to explore any additional resources that may help her pay for her medications -We discussed the rationale for indefinite anticoagulation, as well as some of the potential risks of stopping the anticoagulation, including possible recurrent VTE; however, we also recognize the financial burden of her medication, and if she continues to have significant difficulty affording the medication and there is no additional resource available to her, then we may have to consider stopping anticoagulation as long as the patient understands the potential risks of stopping Eliquis -I reinforced the importance of preventive strategies such as avoiding hormonal supplement, avoiding cigarette smoking, keeping up-to-date with screening programs for early cancer detection, frequent ambulation for long distance travel and aggressive DVT prophylaxis in all surgical  settings. -Should she need any interruption of the anticoagulation for elective procedures in the future, feel free to contact me regarding peri-operative management.  Normocytic anemia -Likely multifactorial, including mild iron deficiency ane possibly underlying renal dysfunction -EGD and colonoscopy in 11/2018 showed no evidence of bleeding -Hgb 12.7 today, improving   -We will monitor it for now  -If anemia recurs or renal function worsens in the future, we can consider checking SPEP to rule out any monoclonal protein   Osteoarthritis -Patient is scheduled for R knee arthroplasty on 12/09/2018; previously in 09/2018 but delayed due to [place] to hold Eliquis two days prior to the surgery, and may resume anticoagulation 48 to 72 hours after the surgery  Orders Placed This Encounter  Procedures  . CBC with Differential (Cancer Center Only)    Standing Status:   Future    Standing Expiration Date:   12/31/2019  . CMP ([facility] only)    Standing Status:   Future    Standing Expiration Date:   12/31/2019   All questions were answered. The patient knows to call the clinic with any problems, questions or concerns. No barriers to learning was detected.  A total of more than 25 minutes were spent face-to-face with the patient during this encounter and over half of that time was spent on counseling and coordination of care as outlined above.   Return in 6 months for labs and clinic follow-up.   Tish Men, MD 11/26/2018 10:16 AM  CHIEF COMPLAINT: "My knee still hurts"  INTERVAL HISTORY: Sierra Johnson returns clinic for follow-up of unprovoked PTE.  Patient reports that she has been tolerating reduced dose Eliquis well without any abnormal bleeding or bruising.  Since she retired, her income has decreased substantially, and she has had more  difficulty with paying for Eliquis.  She was provided application for medication assistance from the pharmaceutical company, but unfortunately her  application was declined.  She underwent EGD and colonoscopy in early 11/2018 and tolerated them well.  She is scheduled for right knee replacement later this month.  She denies any other complaint today.  REVIEW OF SYSTEMS:   Constitutional: ( - ) fevers, ( - )  chills , ( - ) night sweats Eyes: ( - ) blurriness of vision, ( - ) double vision, ( - ) watery eyes Ears, nose, mouth, throat, and face: ( - ) mucositis, ( - ) sore throat Respiratory: ( - ) cough, ( - ) dyspnea, ( - ) wheezes Cardiovascular: ( - ) palpitation, ( - ) chest discomfort, ( - ) lower extremity swelling Gastrointestinal:  ( - ) nausea, ( - ) heartburn, ( - ) change in bowel habits Skin: ( - ) abnormal skin rashes Lymphatics: ( - ) new lymphadenopathy, ( - ) easy bruising Neurological: ( - ) numbness, ( - ) tingling, ( - ) new weaknesses Behavioral/Psych: ( - ) mood change, ( - ) new changes  All other systems were reviewed with the patient and are negative.  I have reviewed the past medical history, past surgical history, social history and family history with the patient and they are unchanged from previous note.  ALLERGIES:  is allergic to benazepril; amlodipine; and tetracycline.  MEDICATIONS:  Current Outpatient Medications  Medication Sig Dispense Refill  . Accu-Chek FastClix Lancets MISC USE TO CHECK BLOOD SUGARS TWICE A DAY 102 each 0  . albuterol (PROVENTIL HFA;VENTOLIN HFA) 108 (90 Base) MCG/ACT inhaler Inhale 2 puffs into the lungs every 4 (four) hours as needed for wheezing or shortness of breath. Dispense with aerochamber 1 Inhaler 0  . Blood Glucose Monitoring Suppl (ACCU-CHEK GUIDE) w/Device KIT 1 each by Does not apply route 2 (two) times daily. DX: E11.9 1 kit 0  . Cholecalciferol (VITAMIN D3) 1000 UNITS CAPS Take 1,000 Units by mouth daily.     . fish oil-omega-3 fatty acids 1000 MG capsule Take 1 g by mouth daily.     . fluticasone (FLONASE) 50 MCG/ACT nasal spray Place 2 sprays into both nostrils  daily. (Patient taking differently: Place 2 sprays into both nostrils as needed. ) 16 g 6  . glucose blood (ACCU-CHEK GUIDE) test strip UUSE TO CHECK BLOOD SUGARS TWICE A DAY 200 each 3  . glucose monitoring kit (FREESTYLE) monitoring kit 1 each by Does not apply route 2 (two) times daily. Dx: E11.9, I10 1 each 0  . Insulin Pen Needle 31G X 5 MM MISC 1 Units by Does not apply route 4 (four) times daily. 50 each 11  . ipratropium-albuterol (DUONEB) 0.5-2.5 (3) MG/3ML SOLN Take 3 mLs by nebulization every 6 (six) hours as needed. 360 mL 1  . loratadine (CLARITIN) 10 MG tablet Take 1 tablet (10 mg total) by mouth daily. 30 tablet 11  . MAGNESIUM PO Take 1 tablet by mouth 2 (two) times daily.    . metFORMIN (GLUCOPHAGE) 500 MG tablet TAKE 1 TABLET BY MOUTH 3 TIMES DAILY (Patient taking differently: Take 500 mg by mouth 2 (two) times daily with a meal. ) 270 tablet 3  . Methylsulfonylmethane (MSM PO) Take 1 tablet by mouth 2 (two) times daily.    [name] Spacer/Aero-Holding Chambers (AEROCHAMBER PLUS) inhaler Use as instructed 1 each 2  . TOUJEO SOLOSTAR 300 UNIT/ML SOPN INJECT 18 UNITS INTO THE  SKIN EVERY MORNING. 13.5 pen 9  . vitamin B-12 (CYANOCOBALAMIN) 1000 MCG tablet Take 1,000 mcg by mouth daily.      [name] apixaban (ELIQUIS) 2.5 MG TABS tablet Take 1 tablet (2.5 mg total) by mouth 2 (two) times daily. 180 tablet 3   No current facility-administered medications for this visit.     PHYSICAL EXAMINATION: ECOG PERFORMANCE STATUS: 1 - Symptomatic but completely ambulatory  Today's Vitals   11/26/18 1008  BP: (!) 164/100  Pulse: 82  Resp: 18  Temp: 98.5 F (36.9 C)  TempSrc: Oral  SpO2: 99%  Weight: 198 lb (89.8 kg)  Height: '5\' 1"'$  (1.549 m)  PainSc: 0-No pain   Body mass index is 37.41 kg/m.  Filed Weights   11/26/18 1008  Weight: 198 lb (89.8 kg)    GENERAL: alert, no distress and comfortable SKIN: skin color, texture, turgor are normal, no rashes or significant lesions EYES:  conjunctiva are pink and non-injected, sclera clear OROPHARYNX: no exudate, no erythema; lips, buccal mucosa, and tongue normal  NECK: supple, non-tender LUNGS: clear to auscultation with normal breathing effort HEART: regular rate & rhythm and no murmurs and no lower extremity edema ABDOMEN: soft, non-tender, non-distended, normal bowel sounds PSYCH: alert & oriented x 3, fluent speech NEURO: no focal motor/sensory deficits  LABORATORY DATA:  I have reviewed the data as listed    Component Value Date/Time   NA 137 08/20/2018 1023   K 4.5 08/20/2018 1023   CL 101 08/20/2018 1023   CO2 28 08/20/2018 1023   GLUCOSE 117 (H) 08/20/2018 1023   GLUCOSE 74 05/17/2006 0957   BUN 8 08/20/2018 1023   CREATININE 0.97 08/20/2018 1023   CALCIUM 9.9 08/20/2018 1023   PROT 8.4 (H) 08/20/2018 1023   ALBUMIN 3.9 08/20/2018 1023   AST 13 (L) 08/20/2018 1023   ALT 9 08/20/2018 1023   ALKPHOS 71 08/20/2018 1023   BILITOT 0.5 08/20/2018 1023   GFRNONAA >60 08/20/2018 1023   GFRAA >60 08/20/2018 1023    No results found for: SPEP, UPEP  Lab Results  Component Value Date   WBC 9.6 11/26/2018   NEUTROABS 5.7 11/26/2018   HGB 12.7 11/26/2018   HCT 39.7 11/26/2018   MCV 86.1 11/26/2018   PLT 319 11/26/2018      Chemistry      Component Value Date/Time   NA 137 08/20/2018 1023   K 4.5 08/20/2018 1023   CL 101 08/20/2018 1023   CO2 28 08/20/2018 1023   BUN 8 08/20/2018 1023   CREATININE 0.97 08/20/2018 1023      Component Value Date/Time   CALCIUM 9.9 08/20/2018 1023   ALKPHOS 71 08/20/2018 1023   AST 13 (L) 08/20/2018 1023   ALT 9 08/20/2018 1023   BILITOT 0.5 08/20/2018 1023

## 2018-11-26 ENCOUNTER — Inpatient Hospital Stay: Payer: Medicare Other | Attending: Hematology | Admitting: Hematology

## 2018-11-26 ENCOUNTER — Other Ambulatory Visit: Payer: Self-pay

## 2018-11-26 ENCOUNTER — Inpatient Hospital Stay: Payer: Medicare Other

## 2018-11-26 ENCOUNTER — Encounter: Payer: Self-pay | Admitting: Hematology

## 2018-11-26 VITALS — BP 164/100 | HR 82 | Temp 98.5°F | Resp 18 | Ht 61.0 in | Wt 198.0 lb

## 2018-11-26 DIAGNOSIS — Z79899 Other long term (current) drug therapy: Secondary | ICD-10-CM

## 2018-11-26 DIAGNOSIS — D649 Anemia, unspecified: Secondary | ICD-10-CM | POA: Diagnosis not present

## 2018-11-26 DIAGNOSIS — M1990 Unspecified osteoarthritis, unspecified site: Secondary | ICD-10-CM | POA: Diagnosis not present

## 2018-11-26 DIAGNOSIS — I2699 Other pulmonary embolism without acute cor pulmonale: Secondary | ICD-10-CM

## 2018-11-26 DIAGNOSIS — Z794 Long term (current) use of insulin: Secondary | ICD-10-CM

## 2018-11-26 DIAGNOSIS — Z888 Allergy status to other drugs, medicaments and biological substances status: Secondary | ICD-10-CM | POA: Insufficient documentation

## 2018-11-26 DIAGNOSIS — Z881 Allergy status to other antibiotic agents status: Secondary | ICD-10-CM | POA: Diagnosis not present

## 2018-11-26 DIAGNOSIS — Z7901 Long term (current) use of anticoagulants: Secondary | ICD-10-CM | POA: Insufficient documentation

## 2018-11-26 DIAGNOSIS — M170 Bilateral primary osteoarthritis of knee: Secondary | ICD-10-CM

## 2018-11-26 DIAGNOSIS — E119 Type 2 diabetes mellitus without complications: Secondary | ICD-10-CM | POA: Diagnosis not present

## 2018-11-26 LAB — CMP (CANCER CENTER ONLY)
ALT: 12 U/L (ref 0–44)
AST: 15 U/L (ref 15–41)
Albumin: 4.1 g/dL (ref 3.5–5.0)
Alkaline Phosphatase: 89 U/L (ref 38–126)
Anion gap: 9 (ref 5–15)
BUN: 15 mg/dL (ref 8–23)
CO2: 28 mmol/L (ref 22–32)
Calcium: 10.2 mg/dL (ref 8.9–10.3)
Chloride: 100 mmol/L (ref 98–111)
Creatinine: 1.07 mg/dL — ABNORMAL HIGH (ref 0.44–1.00)
GFR, Est AFR Am: >60 mL/min (ref >60)
GFR, Estimated: 54 mL/min — ABNORMAL LOW (ref >60)
Glucose, Bld: 109 mg/dL — ABNORMAL HIGH (ref 70–99)
Potassium: 4.2 mmol/L (ref 3.5–5.1)
Sodium: 137 mmol/L (ref 135–145)
Total Bilirubin: 0.4 mg/dL (ref 0.3–1.2)
Total Protein: 8.4 g/dL — ABNORMAL HIGH (ref 6.5–8.1)

## 2018-11-26 LAB — CBC WITH DIFFERENTIAL (CANCER CENTER ONLY)
Abs Immature Granulocytes: 0.01 10*3/uL (ref 0.00–0.07)
Basophils Absolute: 0.1 10*3/uL (ref 0.0–0.1)
Basophils Relative: 1 %
Eosinophils Absolute: 0.4 10*3/uL (ref 0.0–0.5)
Eosinophils Relative: 5 %
HCT: 39.7 % (ref 36.0–46.0)
Hemoglobin: 12.7 g/dL (ref 12.0–15.0)
Immature Granulocytes: 0 %
Lymphocytes Relative: 28 %
Lymphs Abs: 2.7 10*3/uL (ref 0.7–4.0)
MCH: 27.5 pg (ref 26.0–34.0)
MCHC: 32 g/dL (ref 30.0–36.0)
MCV: 86.1 fL (ref 80.0–100.0)
Monocytes Absolute: 0.7 10*3/uL (ref 0.1–1.0)
Monocytes Relative: 7 %
Neutro Abs: 5.7 10*3/uL (ref 1.7–7.7)
Neutrophils Relative %: 59 %
Platelet Count: 319 10*3/uL (ref 150–400)
RBC: 4.61 MIL/uL (ref 3.87–5.11)
RDW: 14.5 % (ref 11.5–15.5)
WBC Count: 9.6 10*3/uL (ref 4.0–10.5)
nRBC: 0 % (ref 0.0–0.2)

## 2018-11-26 LAB — FERRITIN: Ferritin: 42 ng/mL (ref 11–307)

## 2018-11-26 LAB — IRON AND TIBC
Iron: 44 ug/dL (ref 41–142)
Saturation Ratios: 13 % — ABNORMAL LOW (ref 21–57)
TIBC: 330 ug/dL (ref 236–444)
UIBC: 286 ug/dL (ref 120–384)

## 2018-11-26 LAB — LACTATE DEHYDROGENASE: LDH: 171 U/L (ref 98–192)

## 2018-11-29 ENCOUNTER — Other Ambulatory Visit: Payer: Self-pay

## 2018-11-29 ENCOUNTER — Ambulatory Visit (INDEPENDENT_AMBULATORY_CARE_PROVIDER_SITE_OTHER): Payer: Medicare Other

## 2018-11-29 ENCOUNTER — Encounter: Payer: Self-pay | Admitting: Primary Care

## 2018-11-29 ENCOUNTER — Ambulatory Visit (INDEPENDENT_AMBULATORY_CARE_PROVIDER_SITE_OTHER): Payer: Medicare Other | Admitting: Primary Care

## 2018-11-29 VITALS — BP 144/96 | HR 87 | Temp 98.0°F | Ht 60.0 in | Wt 200.8 lb

## 2018-11-29 DIAGNOSIS — R0602 Shortness of breath: Secondary | ICD-10-CM | POA: Diagnosis not present

## 2018-11-29 DIAGNOSIS — R05 Cough: Secondary | ICD-10-CM

## 2018-11-29 DIAGNOSIS — G4733 Obstructive sleep apnea (adult) (pediatric): Secondary | ICD-10-CM | POA: Diagnosis not present

## 2018-11-29 DIAGNOSIS — J189 Pneumonia, unspecified organism: Secondary | ICD-10-CM

## 2018-11-29 DIAGNOSIS — J181 Lobar pneumonia, unspecified organism: Secondary | ICD-10-CM

## 2018-11-29 DIAGNOSIS — G479 Sleep disorder, unspecified: Secondary | ICD-10-CM

## 2018-11-29 DIAGNOSIS — I2699 Other pulmonary embolism without acute cor pulmonale: Secondary | ICD-10-CM

## 2018-11-29 DIAGNOSIS — R059 Cough, unspecified: Secondary | ICD-10-CM

## 2018-11-29 DIAGNOSIS — Z8701 Personal history of pneumonia (recurrent): Secondary | ICD-10-CM | POA: Diagnosis not present

## 2018-11-29 LAB — COMPREHENSIVE METABOLIC PANEL
ALT: 14 U/L (ref 0–35)
AST: 17 U/L (ref 0–37)
Albumin: 3.9 g/dL (ref 3.5–5.2)
Alkaline Phosphatase: 83 U/L (ref 39–117)
BUN: 13 mg/dL (ref 6–23)
CO2: 25 mEq/L (ref 19–32)
Calcium: 9.6 mg/dL (ref 8.4–10.5)
Chloride: 99 mEq/L (ref 96–112)
Creatinine, Ser: 0.97 mg/dL (ref 0.40–1.20)
GFR: 69.63 mL/min (ref >60.00)
Glucose, Bld: 148 mg/dL — ABNORMAL HIGH (ref 70–99)
Potassium: 3.7 mEq/L (ref 3.5–5.1)
Sodium: 133 mEq/L — ABNORMAL LOW (ref 135–145)
Total Bilirubin: 0.3 mg/dL (ref 0.2–1.2)
Total Protein: 8.1 g/dL (ref 6.0–8.3)

## 2018-11-29 LAB — BRAIN NATRIURETIC PEPTIDE: Pro B Natriuretic peptide (BNP): 20 pg/mL (ref 0.0–100.0)

## 2018-11-29 MED ORDER — TRAZODONE HCL 50 MG PO TABS: ORAL_TABLET | ORAL | 0 refills | Status: DC

## 2018-11-29 NOTE — Assessment & Plan Note (Addendum)
-   Unprovoked PE 02/2018, duplex negative for DVT  - Lifelong anticoagulation - Continue Eliquis 2.5 mg twice daily  - Followed by hematology

## 2018-11-29 NOTE — Assessment & Plan Note (Signed)
-   Waking up at 3am and unable to fall back asleep x3-4 times a week  - Trial low dose trazodone 25mg  qhs prn insomnia (limit use, no refills) - Reviewed proper sleep hygiene

## 2018-11-29 NOTE — Patient Instructions (Addendum)
Pleasure seeing you today, great work wearing CPAP  No changes today  Renewal CPAP supplies   Do not drive if experiencing excessive daytime fatigue or somnolence  CXR and labs today- will call with results  Continue Eliquis   FU in 6 months with Dr. Elsworth Soho or sooner if needed   Please follow-up with PCP regarding elevated blood pressure

## 2018-11-29 NOTE — Assessment & Plan Note (Signed)
-   Stable, feeling well. Cough/congestion at night only - Repeat CXR today showed mild haziness in the left base, only seen on the frontal view. The focal infiltrate seen in the left base in 2019 is no longer visualized

## 2018-11-29 NOTE — Progress Notes (Signed)
_0  ID: Sierra Johnson, female    DOB: 1951-05-31, 68 y.o.   MRN: [id number]  Chief Complaint  Patient presents with  . Follow-up    f/u CPAP    Referring provider: Plotnikov, [name], MD  HPI: 68 year old female, never smoked.  Past medical history significant for obstructive sleep apnea, bronchitis, allergic rhinitis, PE.  Patient of Dr. Elsworth Soho, last seen in office on 03/18/2018.  Maintained on albuterol HFA as needed for shortness of breath or wheezing. NPSG in 2011 showed mild sleep apnea with AHI 14/hr.   Hospitalized 9/11 and CTA showed pulmonary embolism RUL and bibasilar airspace disease favored atelectasis or consolidation left worse than right. Duplex negative for DVT. Echocardiogram showed no evidence of pulmonary hypertension.  11/29/2018 Patient presents today for regular follow-up for OSA and CPAP supplies. She is feeling well. Compliant with CPAP use. Has trouble staying asleep 3-4 times a week. States that she wakes up around 3 am and cant fall back asleep. She has been taking melotonin with no improvement. She also has cough at night when lying flat. Denies shortness of breath. Afebrile.   Airview download 90 days - Usage 90/90 days, 100% >4 hours - Average usage 7 hours 43 mins - Pressure 12 cm h20 - AHI 0.2  Allergies  Allergen Reactions  . Benazepril Swelling and Other (See Comments)    Lip swelling  . Amlodipine Swelling and Other (See Comments)    LE swelling on 10 mg  . Tetracycline Nausea And Vomiting    Immunization History  Administered Date(s) Administered  . Influenza, High Dose Seasonal PF 08/19/2018  . Influenza-Unspecified 04/02/2017  . Zoster Recombinat (Shingrix) 06/05/2017, 08/10/2017    Past Medical History:  Diagnosis Date  . ANEMIA-NOS 04/24/2007  . Arthritis   . DIABETES MELLITUS, TYPE II 04/24/2007  . Elevated glucose 2011  . HYPERLIPIDEMIA 07/17/2007   mild  . HYPERTENSION 04/24/2007  . Menopause 1998   Dr. Gaetano Net  .  OBSTRUCTIVE SLEEP APNEA 09/30/2009   on CPAP 2011  . VITAMIN B12 DEFICIENCY 04/24/2007    Tobacco History: Social History   Tobacco Use  Smoking Status Never Smoker  Smokeless Tobacco Never Used   Counseling given: Not Answered   Outpatient Medications Prior to Visit  Medication Sig Dispense Refill  . Accu-Chek FastClix Lancets MISC USE TO CHECK BLOOD SUGARS TWICE A DAY 102 each 0  . albuterol (PROVENTIL HFA;VENTOLIN HFA) 108 (90 Base) MCG/ACT inhaler Inhale 2 puffs into the lungs every 4 (four) hours as needed for wheezing or shortness of breath. Dispense with aerochamber 1 Inhaler 0  . Blood Glucose Monitoring Suppl (ACCU-CHEK GUIDE) w/Device KIT 1 each by Does not apply route 2 (two) times daily. DX: E11.9 1 kit 0  . Cholecalciferol (VITAMIN D3) 1000 UNITS CAPS Take 1,000 Units by mouth daily.     . fish oil-omega-3 fatty acids 1000 MG capsule Take 1 g by mouth daily.     [name] glucose blood (ACCU-CHEK GUIDE) test strip UUSE TO CHECK BLOOD SUGARS TWICE A DAY 200 each 3  . glucose monitoring kit (FREESTYLE) monitoring kit 1 each by Does not apply route 2 (two) times daily. Dx: E11.9, I10 1 each 0  . Insulin Pen Needle 31G X 5 MM MISC 1 Units by Does not apply route 4 (four) times daily. 50 each 11  . ipratropium-albuterol (DUONEB) 0.5-2.5 (3) MG/3ML SOLN Take 3 mLs by nebulization every 6 (six) hours as needed. 360 mL 1  . loratadine (CLARITIN)  10 MG tablet Take 1 tablet (10 mg total) by mouth daily. 30 tablet 11  . MAGNESIUM PO Take 1 tablet by mouth 2 (two) times daily.    . metFORMIN (GLUCOPHAGE) 500 MG tablet TAKE 1 TABLET BY MOUTH 3 TIMES DAILY (Patient taking differently: Take 500 mg by mouth 2 (two) times daily with a meal. ) 270 tablet 3  . Methylsulfonylmethane (MSM PO) Take 1 tablet by mouth 2 (two) times daily.    [name] Spacer/Aero-Holding Chambers (AEROCHAMBER PLUS) inhaler Use as instructed 1 each 2  . TOUJEO SOLOSTAR 300 UNIT/ML SOPN INJECT 18 UNITS INTO THE SKIN EVERY MORNING.  13.5 pen 9  . vitamin B-12 (CYANOCOBALAMIN) 1000 MCG tablet Take 1,000 mcg by mouth daily.      [name] apixaban (ELIQUIS) 2.5 MG TABS tablet Take 1 tablet (2.5 mg total) by mouth 2 (two) times daily. 180 tablet 3  . fluticasone (FLONASE) 50 MCG/ACT nasal spray Place 2 sprays into both nostrils daily. (Patient not taking: Reported on 11/29/2018) 16 g 6   No facility-administered medications prior to visit.    Review of Systems  Review of Systems  Constitutional: Negative.   HENT: Positive for congestion.   Respiratory: Positive for cough. Negative for shortness of breath and wheezing.   Cardiovascular: Negative.    Physical Exam  BP (!) 144/96 (BP Location: Left Arm, Cuff Size: Normal)   Pulse 87   Temp 98 F (36.7 C)   Ht 5' (1.524 m)   Wt 200 lb 12.8 oz (91.1 kg)   SpO2 96%   BMI 39.22 kg/m  Physical Exam Constitutional:      General: She is not in acute distress.    Appearance: Normal appearance. She is not ill-appearing.  HENT:     Head: Normocephalic and atraumatic.  Cardiovascular:     Rate and Rhythm: Normal rate and regular rhythm.     Comments: No edema Pulmonary:     Effort: Pulmonary effort is normal.     Breath sounds: No wheezing or rhonchi.     Comments: CTA, diminished left base Musculoskeletal: Normal range of motion.  Skin:    General: Skin is warm and dry.  Neurological:     General: No focal deficit present.     Mental Status: She is alert and oriented to person, place, and time. Mental status is at baseline.  Psychiatric:        Mood and Affect: Mood normal.        Behavior: Behavior normal.        Thought Content: Thought content normal.        Judgment: Judgment normal.      Lab Results:  CBC    Component Value Date/Time   WBC 9.6 11/26/2018 0935   WBC 8.2 03/05/2018 1146   RBC 4.61 11/26/2018 0935   HGB 12.7 11/26/2018 0935   HCT 39.7 11/26/2018 0935   PLT 319 11/26/2018 0935   MCV 86.1 11/26/2018 0935   MCH 27.5 11/26/2018 0935   MCHC  32.0 11/26/2018 0935   RDW 14.5 11/26/2018 0935   LYMPHSABS 2.7 11/26/2018 0935   MONOABS 0.7 11/26/2018 0935   EOSABS 0.4 11/26/2018 0935   BASOSABS 0.1 11/26/2018 0935    BMET    Component Value Date/Time   NA 137 11/26/2018 0935   K 4.2 11/26/2018 0935   CL 100 11/26/2018 0935   CO2 28 11/26/2018 0935   GLUCOSE 109 (H) 11/26/2018 0935   GLUCOSE 74 05/17/2006 0957  BUN 15 11/26/2018 0935   CREATININE 1.07 (H) 11/26/2018 0935   CALCIUM 10.2 11/26/2018 0935   GFRNONAA 54 (L) 11/26/2018 0935   GFRAA >60 11/26/2018 0935    BNP No results found for: BNP  ProBNP No results found for: PROBNP  Imaging: Dg Chest 2 View  Result Date: 11/29/2018 CLINICAL DATA:  Follow-up pneumonia.  Cough. EXAM: CHEST - 2 VIEW COMPARISON:  March 18, 2018 FINDINGS: There is mild haziness in the left base, only seen on the frontal view. The focal infiltrate seen in the left base in 2019 is no longer visualized. The heart, hila, mediastinum, lungs, and pleura are otherwise normal. IMPRESSION: Minimal haziness in the left base only on the frontal view favored to represent atelectasis. Interval resolution of the previous focal infiltrate in the left retrocardiac region. Electronically Signed   By: Dorise Bullion III M.D   On: 11/29/2018 10:03     Assessment & Plan:   Obstructive sleep apnea - Patient is 100% compliant with CPAP use and reports benefit - Pressure 12cm h20; AHI 0.2 - Needs CPAP supplies renewed  - Do not drive if experiencing excessive daytime fatigue or somnolence - FU in 6 months with Dr. Elsworth Soho or sooner if needed   Pulmonary embolus, right ([place]) - Unprovoked PE 02/2018, duplex negative for DVT  - Lifelong anticoagulation - Continue Eliquis 2.5 mg twice daily  - Followed by hematology   History of community acquired pneumonia - Stable, feeling well. Cough/congestion at night only - Repeat CXR today showed mild haziness in the left base, only seen on the frontal view. The  focal infiltrate seen in the left base in 2019 is no longer visualized  Sleep difficulties - Waking up at 3am and unable to fall back asleep x3-4 times a week  - Trial low dose trazodone 12m qhs prn insomnia (limit use, no refills) - Reviewed proper sleep hygiene    EMartyn Ehrich NP 11/29/2018

## 2018-11-29 NOTE — Assessment & Plan Note (Addendum)
-   Patient is 100% compliant with CPAP use and reports benefit - Pressure 12cm h20; AHI 0.2 - Needs CPAP supplies renewed  - Do not drive if experiencing excessive daytime fatigue or somnolence - FU in 6 months with Dr. Elsworth Soho or sooner if needed

## 2018-12-03 ENCOUNTER — Telehealth: Payer: Self-pay | Admitting: Internal Medicine

## 2018-12-03 ENCOUNTER — Telehealth: Payer: Self-pay | Admitting: *Deleted

## 2018-12-03 ENCOUNTER — Telehealth: Payer: Self-pay | Admitting: Primary Care

## 2018-12-03 NOTE — Telephone Encounter (Signed)
Primary Pulmonologist: Dr Elsworth Soho Last office visit and with whom: Beth, NP, 11/29/18 What do we see them for (pulmonary problems): SHOB, OSA, pna Last OV assessment/plan:       Instructions    Return in about 6 months (around 05/31/2019), or if symptoms worsen or fail to improve. Pleasure seeing you today, great work wearing CPAP  No changes today  Renewal CPAP supplies   Do not drive if experiencing excessive daytime fatigue or somnolence  CXR and labs today- will call with results  Continue Eliquis   FU in 6 months with Dr. Elsworth Soho or sooner if needed   Please follow-up with PCP regarding elevated blood pressure        Reason for call:  Called and spoke with Patient.  Patient stated she has increased cough, with mostly clear, occasional yellow mucus.  Patient stated she has been using cough medication with codeine, but nebs work best.  Patient stated nebs generally stop her cough for 1-2 hours. Patient stated she is having knee surgery Monday, so she can not take anymore cough meds with codeine after Wednesday.  Patient is also requesting results from recent CXR.  Patient is concerned of blood clot in the lung.  Message routed to [place], NP

## 2018-12-03 NOTE — Telephone Encounter (Signed)
LMTCB

## 2018-12-03 NOTE — Telephone Encounter (Signed)
Pt called and sated that she has a nebulizer machine and would like to know how she can get new tubing and a mouth piece. Please advise

## 2018-12-03 NOTE — Telephone Encounter (Signed)
Called and spoke with Patient.  Benjamine Mola, NP recommendations given.  Understating stated.  Nothing further at this time.

## 2018-12-03 NOTE — Telephone Encounter (Signed)
Call received from patient stating that she has a worsening cough for the last two weeks and is concerned about another blood clot. She states that she has no SOB or fever.  She had a CXR and appt with Dr. Volanda Napoleon on 11/29/18.  Pt instructed to call Dr. Volanda Napoleon for further instruction regarding cough.  Dr. Maylon Peppers notified.  Pt appreciative of assistance.

## 2018-12-03 NOTE — Telephone Encounter (Signed)
I know we already spoke with her about the results but CXR showed resolution of the previous focal infiltrate in the left retrocardiac region. She can use nebulizer 3 times a day if needed. Advise delsym cough syrup and mucinex. She is anticoagulated with Eliquis and her oxygen level was normal. Low likelihood of pulmonary embolism but we can check d-dimer lab if she would like.

## 2018-12-04 MED ORDER — NEBULIZER/TUBING/MOUTHPIECE KIT: PACK | 3 refills | Status: DC

## 2018-12-04 NOTE — Progress Notes (Signed)
MEDICAL CLEARANCE DR PLOTNIKOV 09-05-18 ON CHART LOV PULMONARY [facility] NP 11-29-18 Epic  EKG 02-27-18 Epic CHEST XRAY 11-29-18 Epic ECHO 03-01-28 Epic

## 2018-12-04 NOTE — Telephone Encounter (Signed)
RX faxed to [facility]

## 2018-12-04 NOTE — Patient Instructions (Addendum)
Sierra Johnson     Your procedure is scheduled on: 12-09-2018   Report to [facility] Main  Entrance    Report to short stay at 530  AM   BRING CPAP MASK AND TUBING  YOU NEED TO HAVE A COVID 19 TEST ON 12-05-18 @ 3:00 PM, THIS TEST MUST BE DONE BEFORE SURGERY, COME TO [place]. ONCE YOUR COVID TEST IS COMPLETED, PLEASE BEGIN THE QUARANTINE INSTRUCTIONS AS OUTLINED IN YOUR HANDOUT.   Call this number if you have problems the morning of surgery [phone]    Remember:  NO SOLID FOOD AFTER MIDNIGHT THE NIGHT PRIOR TO SURGERY. NOTHING BY MOUTH EXCEPT CLEAR LIQUIDS UNTIL 415AM. PLEASE FINISH G2 DRINK PER SURGEON ORDER 3 HOURS PRIOR TO SCHEDULED SURGERY TIME WHICH NEEDS TO BE COMPLETED AT 415 AM    CLEAR LIQUID DIET   Foods Allowed                                                                     Foods Excluded  Coffee and tea, regular and decaf                             liquids that you cannot  Plain Jell-O in any flavor                                             see through such as: Fruit ices (not with fruit pulp)                                     milk, soups, orange juice  Iced Popsicles                                    All solid food Carbonated beverages, regular and diet                                    Cranberry, grape and apple juices Sports drinks like Gatorade Lightly seasoned clear broth or consume(fat free) Sugar, honey syrup  Sample Menu Breakfast                                Lunch                                     Supper Cranberry juice                    Beef broth  Chicken broth Jell-O                                     Grape juice                           Apple juice Coffee or tea                        Jell-O                                      Popsicle                                                Coffee or tea                        Coffee or  tea  _____________________________________________________________________    Take these medicines the morning of surgery with A SIP OF WATER: ALBUTEROL INHALER IF NEEDED AND BRING INHALER, DUO NEBULIZER, LORATADINE (CLARITIN)  BRUSH YOUR TEETH MORNING OF SURGERY AND RINSE YOUR MOUTH OUT, NO CHEWING GUM CANDY OR MINTS.                             You may not have any metal on your body including hair pins and              piercings    Do not wear jewelry, make-up, lotions, powders or perfumes, deodorant             Do not wear nail polish.  Do not shave  48 hours prior to surgery.                  Donot bring valuables to the hospital. [facility] IS NOT             RESPONSIBLE   FOR VALUABLES.  Contacts, dentures or bridgework may not be worn into surgery.       _____________________________________________________________________  DO NOT TAKE ANY DIABETIC MEDICATIONS DAY OF YOUR SURGERY    How to Manage Your Diabetes Before and After Surgery  Why is it important to control my blood sugar before and after surgery? . Improving blood sugar levels before and after surgery helps healing and can limit problems. . A way of improving blood sugar control is eating a healthy diet by: o  Eating less sugar and carbohydrates o  Increasing activity/exercise o  Talking with your doctor about reaching your blood sugar goals . High blood sugars (greater than 180 mg/dL) can raise your risk of infections and slow your recovery, so you will need to focus on controlling your diabetes during the weeks before surgery. . Make sure that the doctor who takes care of your diabetes knows about your planned surgery including the date and location.  How do I manage my blood sugar before surgery? . Check your blood sugar at least 4 times a day, starting 2 days before surgery, to make sure that the level is not too high or low. o Check your blood  sugar the morning of your surgery when you wake up and  every 2 hours until you get to the Short Stay unit. . If your blood sugar is less than 70 mg/dL, you will need to treat for low blood sugar: o Do not take insulin. o Treat a low blood sugar (less than 70 mg/dL) with  cup of clear juice (cranberry or apple), 4 glucose tablets, OR glucose gel. o Recheck blood sugar in 15 minutes after treatment (to make sure it is greater than 70 mg/dL). If your blood sugar is not greater than 70 mg/dL on recheck, call [phone] for further instructions. . Report your blood sugar to the short stay nurse when you get to Short Stay.  . If you are admitted to the hospital after surgery: o Your blood sugar will be checked by the staff and you will probably be given insulin after surgery (instead of oral diabetes medicines) to make sure you have good blood sugar levels. o The goal for blood sugar control after surgery is 80-180 mg/dL.   WHAT DO I DO ABOUT MY DIABETES MEDICATION?  [name] Do not take oral diabetes medicines (pills) the morning of surgery.  . THE DAY BEFORE SURGERY TAKE METFORMIN AS USUAL.      . THE MORNING OF SURGERY DO NOT TAKE METFORMIN.   [name]    Reviewed and Endorsed by [facility] Patient Education Committee, August 2015              [facility] - Preparing for Surgery Before surgery, you can play an important role.  Because skin is not sterile, your skin needs to be as free of germs as possible.  You can reduce the number of germs on your skin by washing with CHG (chlorahexidine gluconate) soap before surgery.  CHG is an antiseptic cleaner which kills germs and bonds with the skin to continue killing germs even after washing. Please DO NOT use if you have an allergy to CHG or antibacterial soaps.  If your skin becomes reddened/irritated stop using the CHG and inform your nurse when you arrive at Short Stay. Do not shave (including legs and underarms) for at least 48 hours prior to the first CHG shower.  You may shave your  face/neck. Please follow these instructions carefully:  1.  Shower with CHG Soap the night before surgery and the  morning of Surgery.  2.  If you choose to wash your hair, wash your hair first as usual with your  normal  shampoo.  3.  After you shampoo, rinse your hair and body thoroughly to remove the  shampoo.                           4.  Use CHG as you would any other liquid soap.  You can apply chg directly  to the skin and wash                       Gently with a scrungie or clean washcloth.  5.  Apply the CHG Soap to your body ONLY FROM THE NECK DOWN.   Do not use on face/ open                           Wound or open sores. Avoid contact with eyes, ears mouth and genitals (private parts).  Wash face,  Genitals (private parts) with your normal soap.             6.  Wash thoroughly, paying special attention to the area where your surgery  will be performed.  7.  Thoroughly rinse your body with warm water from the neck down.  8.  DO NOT shower/wash with your normal soap after using and rinsing off  the CHG Soap.                9.  Pat yourself dry with a clean towel.            10.  Wear clean pajamas.            11.  Place clean sheets on your bed the night of your first shower and do not  sleep with pets. Day of Surgery : Do not apply any lotions/deodorants the morning of surgery.  Please wear clean clothes to the hospital/surgery center.  FAILURE TO FOLLOW THESE INSTRUCTIONS MAY RESULT IN THE CANCELLATION OF YOUR SURGERY PATIENT SIGNATURE_________________________________  NURSE SIGNATURE__________________________________  ________________________________________________________________________   Adam Phenix  An incentive spirometer is a tool that can help keep your lungs clear and active. This tool measures how well you are filling your lungs with each breath. Taking long deep breaths may help reverse or decrease the chance of developing breathing  (pulmonary) problems (especially infection) following:  A long period of time when you are unable to move or be active. BEFORE THE PROCEDURE   If the spirometer includes an indicator to show your best effort, your nurse or respiratory therapist will set it to a desired goal.  If possible, sit up straight or lean slightly forward. Try not to slouch.  Hold the incentive spirometer in an upright position. INSTRUCTIONS FOR USE  1. Sit on the edge of your bed if possible, or sit up as far as you can in bed or on a chair. 2. Hold the incentive spirometer in an upright position. 3. Breathe out normally. 4. Place the mouthpiece in your mouth and seal your lips tightly around it. 5. Breathe in slowly and as deeply as possible, raising the piston or the ball toward the top of the column. 6. Hold your breath for 3-5 seconds or for as long as possible. Allow the piston or ball to fall to the bottom of the column. 7. Remove the mouthpiece from your mouth and breathe out normally. 8. Rest for a few seconds and repeat Steps 1 through 7 at least 10 times every 1-2 hours when you are awake. Take your time and take a few normal breaths between deep breaths. 9. The spirometer may include an indicator to show your best effort. Use the indicator as a goal to work toward during each repetition. 10. After each set of 10 deep breaths, practice coughing to be sure your lungs are clear. If you have an incision (the cut made at the time of surgery), support your incision when coughing by placing a pillow or rolled up towels firmly against it. Once you are able to get out of bed, walk around indoors and cough well. You may stop using the incentive spirometer when instructed by your caregiver.  RISKS AND COMPLICATIONS  Take your time so you do not get dizzy or light-headed.  If you are in pain, you may need to take or ask for pain medication before doing incentive spirometry. It is harder to take a deep breath if you  are having  pain. AFTER USE  Rest and breathe slowly and easily.  It can be helpful to keep track of a log of your progress. Your caregiver can provide you with a simple table to help with this. If you are using the spirometer at home, follow these instructions: [place] IF:   You are having difficultly using the spirometer.  You have trouble using the spirometer as often as instructed.  Your pain medication is not giving enough relief while using the spirometer.  You develop fever of 100.5 F (38.1 C) or higher. SEEK IMMEDIATE MEDICAL CARE IF:   You cough up bloody sputum that had not been present before.  You develop fever of 102 F (38.9 C) or greater.  You develop worsening pain at or near the incision site. MAKE SURE YOU:   Understand these instructions.  Will watch your condition.  Will get help right away if you are not doing well or get worse. Document Released: 10/16/2006 Document Revised: 08/28/2011 Document Reviewed: 12/17/2006 [employer] Patient Information 2014 Exit[employer], [place].   ________________________________________________________________________  WHAT IS A BLOOD TRANSFUSION? Blood Transfusion Information  A transfusion is the replacement of blood or some of its parts. Blood is made up of multiple cells which provide different functions.  Red blood cells carry oxygen and are used for blood loss replacement.  White blood cells fight against infection.  Platelets control bleeding.  Plasma helps clot blood.  Other blood products are available for specialized needs, such as hemophilia or other clotting disorders. BEFORE THE TRANSFUSION  Who gives blood for transfusions?   Healthy volunteers who are fully evaluated to make sure their blood is safe. This is blood bank blood. Transfusion therapy is the safest it has ever been in the practice of medicine. Before blood is taken from a donor, a complete history is taken to make sure that person has  no history of diseases nor engages in risky social behavior (examples are intravenous drug use or sexual activity with multiple partners). The donor's travel history is screened to minimize risk of transmitting infections, such as malaria. The donated blood is tested for signs of infectious diseases, such as HIV and hepatitis. The blood is then tested to be sure it is compatible with you in order to minimize the chance of a transfusion reaction. If you or a relative donates blood, this is often done in anticipation of surgery and is not appropriate for emergency situations. It takes many days to process the donated blood. RISKS AND COMPLICATIONS Although transfusion therapy is very safe and saves many lives, the main dangers of transfusion include:   Getting an infectious disease.  Developing a transfusion reaction. This is an allergic reaction to something in the blood you were given. Every precaution is taken to prevent this. The decision to have a blood transfusion has been considered carefully by your caregiver before blood is given. Blood is not given unless the benefits outweigh the risks. AFTER THE TRANSFUSION  Right after receiving a blood transfusion, you will usually feel much better and more energetic. This is especially true if your red blood cells have gotten low (anemic). The transfusion raises the level of the red blood cells which carry oxygen, and this usually causes an energy increase.  The nurse administering the transfusion will monitor you carefully for complications. HOME CARE INSTRUCTIONS  No special instructions are needed after a transfusion. You may find your energy is better. Speak with your caregiver about any limitations on activity for underlying diseases  you may have. SEEK MEDICAL CARE IF:   Your condition is not improving after your transfusion.  You develop redness or irritation at the intravenous (IV) site. SEEK IMMEDIATE MEDICAL CARE IF:  Any of the following  symptoms occur over the next 12 hours:  Shaking chills.  You have a temperature by mouth above 102 F (38.9 C), not controlled by medicine.  Chest, back, or muscle pain.  People around you feel you are not acting correctly or are confused.  Shortness of breath or difficulty breathing.  Dizziness and fainting.  You get a rash or develop hives.  You have a decrease in urine output.  Your urine turns a dark color or changes to pink, red, or brown. Any of the following symptoms occur over the next 10 days:  You have a temperature by mouth above 102 F (38.9 C), not controlled by medicine.  Shortness of breath.  Weakness after normal activity.  The white part of the eye turns yellow (jaundice).  You have a decrease in the amount of urine or are urinating less often.  Your urine turns a dark color or changes to pink, red, or brown. Document Released: 06/02/2000 Document Revised: 08/28/2011 Document Reviewed: 01/20/2008 [facility] Patient Information 2014 [place], [place].  _______________________________________________________________________

## 2018-12-05 ENCOUNTER — Other Ambulatory Visit (HOSPITAL_COMMUNITY)
Admission: RE | Admit: 2018-12-05 | Discharge: 2018-12-05 | Disposition: A | Discharge status: Home / Self Care | Payer: Medicare Other | Source: Ambulatory Visit | Attending: Orthopedic Surgery | Admitting: Orthopedic Surgery

## 2018-12-05 ENCOUNTER — Other Ambulatory Visit: Payer: Self-pay

## 2018-12-05 ENCOUNTER — Encounter (HOSPITAL_COMMUNITY): Payer: Self-pay | Admitting: Physician Assistant

## 2018-12-05 ENCOUNTER — Encounter (HOSPITAL_COMMUNITY): Payer: Self-pay

## 2018-12-05 ENCOUNTER — Encounter (HOSPITAL_COMMUNITY)
Admission: RE | Admit: 2018-12-05 | Discharge: 2018-12-05 | Disposition: A | Discharge status: Home / Self Care | Payer: Medicare Other | Source: Ambulatory Visit | Attending: Orthopedic Surgery | Admitting: Orthopedic Surgery

## 2018-12-05 DIAGNOSIS — Z1159 Encounter for screening for other viral diseases: Secondary | ICD-10-CM | POA: Insufficient documentation

## 2018-12-05 DIAGNOSIS — Z01812 Encounter for preprocedural laboratory examination: Secondary | ICD-10-CM | POA: Insufficient documentation

## 2018-12-05 DIAGNOSIS — M1711 Unilateral primary osteoarthritis, right knee: Secondary | ICD-10-CM | POA: Insufficient documentation

## 2018-12-05 LAB — COMPREHENSIVE METABOLIC PANEL
ALT: 18 U/L (ref 0–44)
AST: 20 U/L (ref 15–41)
Albumin: 3.5 g/dL (ref 3.5–5.0)
Alkaline Phosphatase: 84 U/L (ref 38–126)
Anion gap: 7 (ref 5–15)
BUN: 17 mg/dL (ref 8–23)
CO2: 28 mmol/L (ref 22–32)
Calcium: 9.2 mg/dL (ref 8.9–10.3)
Chloride: 105 mmol/L (ref 98–111)
Creatinine, Ser: 0.92 mg/dL (ref 0.44–1.00)
GFR calc Af Amer: >60 mL/min (ref >60)
GFR calc non Af Amer: >60 mL/min (ref >60)
Glucose, Bld: 120 mg/dL — ABNORMAL HIGH (ref 70–99)
Potassium: 3.9 mmol/L (ref 3.5–5.1)
Sodium: 140 mmol/L (ref 135–145)
Total Bilirubin: 0.3 mg/dL (ref 0.3–1.2)
Total Protein: 8 g/dL (ref 6.5–8.1)

## 2018-12-05 LAB — CBC
HCT: 39.4 % (ref 36.0–46.0)
Hemoglobin: 12 g/dL (ref 12.0–15.0)
MCH: 27 pg (ref 26.0–34.0)
MCHC: 30.5 g/dL (ref 30.0–36.0)
MCV: 88.7 fL (ref 80.0–100.0)
Platelets: 343 10*3/uL (ref 150–400)
RBC: 4.44 MIL/uL (ref 3.87–5.11)
RDW: 15.1 % (ref 11.5–15.5)
WBC: 11.3 10*3/uL — ABNORMAL HIGH (ref 4.0–10.5)
nRBC: 0 % (ref 0.0–0.2)

## 2018-12-05 LAB — HEMOGLOBIN A1C
Hgb A1c MFr Bld: 6.1 % — ABNORMAL HIGH (ref 4.8–5.6)
Mean Plasma Glucose: 128.37 mg/dL

## 2018-12-05 LAB — PROTIME-INR
INR: 1 (ref 0.8–1.2)
Prothrombin Time: 13.4 seconds (ref 11.4–15.2)

## 2018-12-05 LAB — APTT: aPTT: 39 seconds — ABNORMAL HIGH (ref 24–36)

## 2018-12-05 LAB — GLUCOSE, CAPILLARY: Glucose-Capillary: 136 mg/dL — ABNORMAL HIGH (ref 70–99)

## 2018-12-05 LAB — SURGICAL PCR SCREEN
MRSA, PCR: NEGATIVE
Staphylococcus aureus: NEGATIVE

## 2018-12-05 NOTE — Progress Notes (Signed)
BP readings were 196/130, 185/96, and 181/102 at PAT appointment. Konrad Felix, PA. Advised of reading. Per Janett Billow, pt to contact her primary,  Dr. Alain Marion and advise of BP readings to see if he prescribes an antihypertensive medication. Pt also made aware that if her BP is elevated the day of surgery, surgery can be cancelled. Pt verbalized understanding.

## 2018-12-06 ENCOUNTER — Ambulatory Visit (INDEPENDENT_AMBULATORY_CARE_PROVIDER_SITE_OTHER): Payer: Medicare Other | Admitting: Internal Medicine

## 2018-12-06 ENCOUNTER — Encounter: Payer: Self-pay | Admitting: Internal Medicine

## 2018-12-06 ENCOUNTER — Other Ambulatory Visit: Payer: Self-pay

## 2018-12-06 ENCOUNTER — Ambulatory Visit: Payer: Self-pay | Admitting: *Deleted

## 2018-12-06 VITALS — BP 178/120 | HR 81 | Temp 99.2°F | Ht 60.0 in | Wt 201.0 lb

## 2018-12-06 DIAGNOSIS — J4521 Mild intermittent asthma with (acute) exacerbation: Secondary | ICD-10-CM

## 2018-12-06 DIAGNOSIS — I10 Essential (primary) hypertension: Secondary | ICD-10-CM

## 2018-12-06 LAB — SARS CORONAVIRUS 2 (TAT 6-24 HRS): SARS Coronavirus 2: NEGATIVE

## 2018-12-06 LAB — ABO/RH: ABO/RH(D): A POS

## 2018-12-06 MED ORDER — HYDROCHLOROTHIAZIDE 12.5 MG PO CAPS: 12.5000 mg | ORAL_CAPSULE | Freq: Every day | ORAL | 1 refills | Status: DC

## 2018-12-06 NOTE — Telephone Encounter (Signed)
Patient was at pre-op for knee surgery yesterday and her BP reading were elevated- patient was told to contact her PCP for medication to bring 185/96 and 181/102. Patient has been on BP medications in the past- but states her sodium levels dropped and she was told to stop BP medications. Patient states she stopped BP medication in August and her reading had been stable- until now.  Call to office- patient will need appointment.  Reason for Disposition . [4] Systolic BP  >= 210 OR Diastolic >= 312  AND [8] having NO cardiac or neurologic symptoms  Answer Assessment - Initial Assessment Questions 1. BLOOD PRESSURE: "What is the blood pressure?" "Did you take at least two measurements 5 minutes apart?"     158/108 , 144/98  P 74 2. ONSET: "When did you take your blood pressure?"     7:00 3. HOW: "How did you obtain the blood pressure?" (e.g., visiting nurse, automatic home BP monitor)     Automatic wrist cuff 4. HISTORY: "Do you have a history of high blood pressure?"     yes 5. MEDICATIONS: "Are you taking any medications for blood pressure?" "Have you missed any doses recently?"     Patient is not on BP medication presently 6. OTHER SYMPTOMS: "Do you have any symptoms?" (e.g., headache, chest pain, blurred vision, difficulty breathing, weakness)     Chest congestion- using nebulizer 7. PREGNANCY: "Is there any chance you are pregnant?" "When was your last menstrual period?"     n/a  Protocols used: HIGH BLOOD PRESSURE-A-AH

## 2018-12-06 NOTE — Patient Instructions (Signed)
We have sent in a blood pressure medicine called hctz to take 1 pill daily.   We have given you a sample of a medicine that you will use 1 puff twice a day to help the breathing. This should work within 3-4 days.   I would recommend to delay the surgery by 1-2 weeks given the breathing problems until this is better.

## 2018-12-06 NOTE — Assessment & Plan Note (Addendum)
Suspect she is having a flare of this. Will do symbicort sample given to patient BID for the next 1-2 weeks to help. Needs to delay surgery until breathing is better and not using nebulizer. Will communicate this need to her surgeon's office. Covid-19 testing negative and recent CXR without signs of pneumonia so will hold off on antibiotics. Advised to take zyrtec also.

## 2018-12-06 NOTE — Assessment & Plan Note (Signed)
BP is very elevated and start hctz 12.5 mg daily. Suspect the nebulizer treatments may have something to do with this but it is unclear if this is all the cause. Advised to continue this through surgery date

## 2018-12-06 NOTE — Progress Notes (Signed)
   Subjective:   Patient ID: Sierra Johnson, female    DOB: Apr 27, 1951, 68 y.o.   MRN: [id number]  HPI The patient is a 68 YO female coming in for several concerns about upcoming surgery including blood pressure (BP high at home and high at the pre-op yesterday, denies chest pains or headache, is having more SOB lately and using nebulizer TID which she thinks might be pushing BP up, she normally is borderline BP 130s-140s/80s) and breathing (for the last two weeks she is not breathing well, normally does not have to use nebulizer, has been using this 3-4 times per day for the last several weeks, saw pulmonary about 1 week ago for follow up of pneumonia and got CXR which did not have acute findings, more SOB with exertion, pre-op testing with negative covid-19 yesterday, denies coughing up much, feels like it was spurred on by allergies, she is not taking anything for this, has not communicated to her surgeon that her breathing is not good currently), and knee pain (scheduled for surgery Monday, pre-op testing with high BP and was asked to get this under control before surgery date).   Review of Systems  Constitutional: Positive for activity change.  HENT: Negative.   Eyes: Negative.   Respiratory: Positive for cough and shortness of breath. Negative for chest tightness.   Cardiovascular: Negative for chest pain, palpitations and leg swelling.  Gastrointestinal: Negative for abdominal distention, abdominal pain, constipation, diarrhea, nausea and vomiting.  Musculoskeletal: Positive for arthralgias and myalgias.  Skin: Negative.   Neurological: Negative.   Psychiatric/Behavioral: Negative.     Objective:  Physical Exam Constitutional:      Appearance: She is well-developed. She is obese.  HENT:     Head: Normocephalic and atraumatic.  Neck:     Musculoskeletal: Normal range of motion.  Cardiovascular:     Rate and Rhythm: Normal rate and regular rhythm.     Comments: Some mild dyspnea  with walking, no wheezing on exam but used nebulizer today Pulmonary:     Effort: Pulmonary effort is normal. No respiratory distress.     Breath sounds: Normal breath sounds. No wheezing or rales.  Abdominal:     General: Bowel sounds are normal. There is no distension.     Palpations: Abdomen is soft.     Tenderness: There is no abdominal tenderness. There is no rebound.  Musculoskeletal:        General: Tenderness present.  Skin:    General: Skin is warm and dry.  Neurological:     Mental Status: She is alert and oriented to person, place, and time.     Coordination: Coordination normal.     Vitals:   12/06/18 1336  BP: (!) 178/120  Pulse: 81  Temp: 99.2 F (37.3 C)  TempSrc: Oral  SpO2: 98%  Weight: 201 lb (91.2 kg)  Height: 5' (1.524 m)    Assessment & Plan:

## 2018-12-06 NOTE — Telephone Encounter (Signed)
Noted patient has appointment at 1:40

## 2018-12-09 ENCOUNTER — Encounter (HOSPITAL_COMMUNITY): Admission: RE | Payer: Self-pay | Source: Home / Self Care

## 2018-12-09 ENCOUNTER — Inpatient Hospital Stay (HOSPITAL_COMMUNITY): Admission: RE | Admit: 2018-12-09 | Payer: Medicare Other | Source: Home / Self Care | Admitting: Orthopedic Surgery

## 2018-12-09 LAB — TYPE AND SCREEN
ABO/RH(D): A POS
Antibody Screen: NEGATIVE

## 2018-12-09 SURGERY — ARTHROPLASTY, KNEE, TOTAL
Anesthesia: Choice | Laterality: Right

## 2018-12-10 ENCOUNTER — Telehealth: Payer: Self-pay | Admitting: Pulmonary Disease

## 2018-12-10 NOTE — Telephone Encounter (Signed)
Order was placed for pt to have refill on cpap supplies 11/29/2018 after pt's OV with Derl Barrow.   [name] and spoke with Lennette Bihari in regards to the order that we placed for pt on 6/12.  Was transferred to another representative who was asking for the reason for why I was calling. I stated to her that they had called [place] so I was returning their call. She stated to me she was going to have to send an email back to [place] or the team who had previously called [place] to have them call us back. Will await a return call.

## 2018-12-11 NOTE — Telephone Encounter (Signed)
Called [employer], spoke with [facility].  Sierra Johnson transferred me to a rep that stated she was sending a fax to our office for Sierra Mola, NP to sign and fax back to [employer], for Patient to receive cpapp supplies through Medicare.  Fax number given.  Nothing further at this time.

## 2018-12-18 ENCOUNTER — Telehealth: Payer: Self-pay | Admitting: Internal Medicine

## 2018-12-18 NOTE — Telephone Encounter (Signed)
Legrand Como, from [employer], called stating he sent fax over for PA on 12/16/2018. Legrand Como states he will send them again on 12/18/2018. Please advise.

## 2018-12-19 ENCOUNTER — Telehealth: Payer: Self-pay

## 2018-12-19 ENCOUNTER — Telehealth: Payer: Self-pay | Admitting: Pulmonary Disease

## 2018-12-19 ENCOUNTER — Ambulatory Visit: Payer: Self-pay | Admitting: *Deleted

## 2018-12-19 MED ORDER — METHYLPREDNISOLONE 4 MG PO TBPK: ORAL_TABLET | ORAL | 0 refills | Status: DC

## 2018-12-19 MED ORDER — CEFDINIR 300 MG PO CAPS: 300.0000 mg | ORAL_CAPSULE | Freq: Two times a day (BID) | ORAL | 0 refills | Status: DC

## 2018-12-19 MED ORDER — PROMETHAZINE-CODEINE 6.25-10 MG/5ML PO SOLN: 5.0000 mL | ORAL | 0 refills | Status: DC | PRN

## 2018-12-19 NOTE — Telephone Encounter (Signed)
Pt did not request supplies from this company

## 2018-12-19 NOTE — Telephone Encounter (Signed)
Patient was seen 1 week ago and diagnosed bronchitis patient was given inhaler- patient is not improving. Patient is having rattling in chest. Patient is calling to see if she needs a different treatment.  Reason for Disposition . [1] MILD difficulty breathing (e.g., minimal/no SOB at rest, SOB with walking, pulse <100) AND [2] NEW-onset or WORSE than normal  Answer Assessment - Initial Assessment Questions 1. RESPIRATORY STATUS: "Describe your breathing?" (e.g., wheezing, shortness of breath, unable to speak, severe coughing)      Patient has congestion in chest- she does cough congestion up at times. Patient has clear to dark yellow phlegm. Patient hears rattling in chest when she lays down at night. 2. ONSET: "When did this breathing problem begin?"      3 weeks 3. PATTERN "Does the difficult breathing come and go, or has it been constant since it started?"      Comes and goes 4. SEVERITY: "How bad is your breathing?" (e.g., mild, moderate, severe)    - MILD: No SOB at rest, mild SOB with walking, speaks normally in sentences, can lay down, no retractions, pulse < 100.    - MODERATE: SOB at rest, SOB with minimal exertion and prefers to sit, cannot lie down flat, speaks in phrases, mild retractions, audible wheezing, pulse 100-120.    - SEVERE: Very SOB at rest, speaks in single words, struggling to breathe, sitting hunched forward, retractions, pulse > 120      Mild- sometimes wheezing when up 5. RECURRENT SYMPTOM: "Have you had difficulty breathing before?" If so, ask: "When was the last time?" and "What happened that time?"      Patient has had bronchitis before- patient was given nebulizer 6. CARDIAC HISTORY: "Do you have any history of heart disease?" (e.g., heart attack, angina, bypass surgery, angioplasty)      no 7. LUNG HISTORY: "Do you have any history of lung disease?"  (e.g., pulmonary embolus, asthma, emphysema)     Hx blood clot on lung- patient is on Eliquis 8. CAUSE: "What  do you think is causing the breathing problem?"      Patient is presently being treated for bronchitis 9. OTHER SYMPTOMS: "Do you have any other symptoms? (e.g., dizziness, runny nose, cough, chest pain, fever)     Cough 10. PREGNANCY: "Is there any chance you are pregnant?" "When was your last menstrual period?"       n/a 11. TRAVEL: "Have you traveled out of the country in the last month?" (e.g., travel history, exposures)       No travel  Protocols used: BREATHING DIFFICULTY-A-AH

## 2018-12-19 NOTE — Telephone Encounter (Signed)
Legrand Como with [facility] stated that it is urgent that the forms that he faxed over be completed and signed today. Cb# [id number]

## 2018-12-19 NOTE — Telephone Encounter (Signed)
Error

## 2018-12-19 NOTE — Telephone Encounter (Signed)
Routing to dr plotnikov---as we talked about earlier, patient has some yellow with sputum she is coughing up--duoneb is not really helping---she does not have any albuterol inhaler left---dr plotnikov has agreed for me to send in medrol dose pak 4mg ---and omnicef 300mg  bid---patient advised---dr plotnikov will be sending in codeine cough syrup---pharmacy is [facility] on [address]---patient advised----routing to dr plotnikov, please send meds in---pt advised to go to ED this weekend if breathing becomes critical---call us back on Monday if no better

## 2018-12-19 NOTE — Addendum Note (Signed)
Addended by: Cassandria Anger on: 12/19/2018 04:38 PM   Modules accepted: Orders

## 2018-12-19 NOTE — Telephone Encounter (Signed)
Sending Rx's

## 2018-12-19 NOTE — Telephone Encounter (Signed)
Spoke with patient. She stated that she has some increased SOB and cough for the past week. She was originally scheduled for surgery tomorrow but it has been canceled due to her symptoms. She only wanted to see RA but I advised that RA is not in clinic today and I could get her scheduled with a NP. Also advised her that our office would be closed tomorrow for the holiday weekend and we had plenty of openings with NPs today. She declined as she only wanted to see RA. Stated that she would call her PCP. Advised her to call us back before 3p if she changes her mind.

## 2018-12-24 ENCOUNTER — Ambulatory Visit: Payer: Self-pay

## 2018-12-24 ENCOUNTER — Other Ambulatory Visit: Payer: Self-pay

## 2018-12-24 ENCOUNTER — Encounter (HOSPITAL_COMMUNITY): Payer: Self-pay | Admitting: Emergency Medicine

## 2018-12-24 ENCOUNTER — Ambulatory Visit (HOSPITAL_COMMUNITY)
Admission: EM | Admit: 2018-12-24 | Discharge: 2018-12-24 | Disposition: A | Discharge status: Home / Self Care | Payer: Medicare Other | Attending: Emergency Medicine | Admitting: Emergency Medicine

## 2018-12-24 ENCOUNTER — Encounter: Payer: Self-pay | Admitting: *Deleted

## 2018-12-24 DIAGNOSIS — J40 Bronchitis, not specified as acute or chronic: Secondary | ICD-10-CM | POA: Diagnosis not present

## 2018-12-24 DIAGNOSIS — I10 Essential (primary) hypertension: Secondary | ICD-10-CM

## 2018-12-24 MED ORDER — ALBUTEROL SULFATE HFA 108 (90 BASE) MCG/ACT IN AERS: 1.0000 | INHALATION_SPRAY | Freq: Four times a day (QID) | RESPIRATORY_TRACT | 0 refills | Status: DC | PRN

## 2018-12-24 MED ORDER — SPACER/AERO-HOLDING CHAMBERS DEVI: 1.0000 "application " | Freq: Once | 0 refills | Status: AC

## 2018-12-24 MED ORDER — DEXAMETHASONE SODIUM PHOSPHATE 10 MG/ML IJ SOLN
INTRAMUSCULAR | Status: AC
  Filled 2018-12-24: qty 1

## 2018-12-24 MED ORDER — DEXAMETHASONE SODIUM PHOSPHATE 10 MG/ML IJ SOLN
10.0000 mg | Freq: Once | INTRAMUSCULAR | Status: AC
  Administered 2018-12-24: 20:00:00 10 mg via INTRAMUSCULAR

## 2018-12-24 NOTE — Telephone Encounter (Signed)
Under care for bronchitis and steroid  And today with wheezing that can be heard across the room. Pt denies SOB or fever. Pt has a h/o blood clot 9/19. Pt with cough. Pt denies chest pain. Care advice given and pt advised to go to [facility]. Assisted pt with making appt at [place]. Pt verbalized understanding. Reason for Disposition . Wheezing can be heard across the room  Answer Assessment - Initial Assessment Questions 1. RESPIRATORY STATUS: "Describe your breathing?" (e.g., wheezing, shortness of breath, unable to speak, severe coughing)      wheezing 2. ONSET: "When did this breathing problem begin?"      This morning 3. PATTERN "Does the difficult breathing come and go, or has it been constant since it started?"      Comes and goes 4. SEVERITY: "How bad is your breathing?" (e.g., mild, moderate, severe)    - MILD: No SOB at rest, mild SOB with walking, speaks normally in sentences, can lay down, no retractions, pulse < 100.    - MODERATE: SOB at rest, SOB with minimal exertion and prefers to sit, cannot lie down flat, speaks in phrases, mild retractions, audible wheezing, pulse 100-120.    - SEVERE: Very SOB at rest, speaks in single words, struggling to breathe, sitting hunched forward, retractions, pulse > 120      No SOB 5. RECURRENT SYMPTOM: "Have you had difficulty breathing before?" If so, ask: "When was the last time?" and "What happened that time?"      Yes- 2 years ago- dx inhaler and nebulzer 6. CARDIAC HISTORY: "Do you have any history of heart disease?" (e.g., heart attack, angina, bypass surgery, angioplasty)      HTN  141/95 7. LUNG HISTORY: "Do you have any history of lung disease?"  (e.g., pulmonary embolus, asthma, emphysema)     Dx 9/19 blood clot in lung 8. CAUSE: "What do you think is causing the breathing problem?"      bronchitis 9. OTHER SYMPTOMS: "Do you have any other symptoms? (e.g., dizziness, runny nose, cough, chest pain, fever)     Cough 10. PREGNANCY: "Is there  any chance you are pregnant?" "When was your last menstrual period?"       n/a 11. TRAVEL: "Have you traveled out of the country in the last month?" (e.g., travel history, exposures)       no  Protocols used: BREATHING DIFFICULTY-A-AH

## 2018-12-24 NOTE — ED Triage Notes (Signed)
Pt here for increased wheezing today; pt being treated for bronchitis; pt sts negative covid test

## 2018-12-24 NOTE — Telephone Encounter (Signed)
This encounter was created in error - please disregard.

## 2018-12-24 NOTE — Discharge Instructions (Signed)
We gave you a shot of Decadron (10 mg) Finish course of antibiotic Use albuterol inhaler as needed 1-2 puffs every 4-6 hours as needed for shortness of breath, wheezing, chest tightness

## 2018-12-25 NOTE — Telephone Encounter (Signed)
Noted, pt went to UC

## 2018-12-26 ENCOUNTER — Telehealth: Payer: Self-pay | Admitting: Internal Medicine

## 2018-12-26 NOTE — Telephone Encounter (Signed)
Pt went to UC on 12/24/18 for bronchitis and was prescribed albuterol (VENTOLIN HFA) 108 (90 Base) MCG/ACT inhaler It was not covered by insurance and she would like for Dr. Alain Marion to send in an inhaler covered under her insurance. Please advise.  [place], [place] - [place] [phone] (Phone) [phone] (Fax)

## 2018-12-26 NOTE — ED Provider Notes (Signed)
[facility]    CSN: [id number] Arrival date & time: 12/24/18  1824      History   Chief Complaint Chief Complaint  Patient presents with  . Wheezing    appt 6:30    HPI Sierra Johnson is a 68 y.o. female history of DM type II, hypertension, hyperlipidemia, OSA, previous PE on Eliquis, presenting today for evaluation of wheezing.  Patient states that she has been treated for bronchitis over the past 5 days.  She has been taking an antibiotic as well as a prednisone taper.  States that she is on her last dose of prednisone today.  Overall she has felt better, but concerned that she has had worsening wheezing.  She is not currently using any inhalers.  Denies fevers chills or body aches.  Recently had negative COVID testing.  Symptoms have not changed since then.  Denies underlying asthma/COPD.  HPI  Past Medical History:  Diagnosis Date  . ANEMIA-NOS 04/24/2007  . Arthritis   . DIABETES MELLITUS, TYPE II 04/24/2007  . Elevated glucose 2011  . HYPERLIPIDEMIA 07/17/2007   mild  . HYPERTENSION 04/24/2007  . Menopause 1998   Dr. Gaetano Net  . OBSTRUCTIVE SLEEP APNEA 09/30/2009   on CPAP 2011  . VITAMIN B12 DEFICIENCY 04/24/2007    Patient Active Problem List   Diagnosis Date Noted  . History of community acquired pneumonia 11/29/2018  . Sleep difficulties 11/29/2018  . Preop exam for internal medicine 08/19/2018  . Allergic rhinitis 08/19/2018  . Hypoxemia 03/05/2018  . Acute pulmonary embolism ([place]) 02/28/2018  . Pulmonary embolus, right ([place]) 02/28/2018  . Hyponatremia 02/28/2018  . Cough 02/21/2018  . Fever and chills 01/30/2018  . Headache 01/30/2018  . Asthmatic bronchitis 01/18/2018  . Upper respiratory infection 04/27/2017  . Insomnia 03/08/2016  . Eczema 04/13/2015  . Edema 02/11/2015  . S/P laparoscopic hernia repair 06/03/2014  . Knee osteoarthritis 04/30/2014  . Recurrent ventral incisional hernia 10/16/2012  . S/P repair of ventral hernia  08/28/2012  . Diabetes mellitus type 2 in obese ([place]) 05/07/2012  . Morbid obesity ([place]) 01/18/2012  . Well adult exam 04/17/2011  . Sinusitis, acute 04/17/2011  . SHOULDER PAIN 12/07/2009  . POLYURIA 12/07/2009  . Obstructive sleep apnea 09/30/2009  . FATIGUE 09/15/2009  . PARESTHESIA 06/08/2008  . Dyslipidemia 07/17/2007  . B12 deficiency 04/24/2007  . Anemia 04/24/2007  . Essential hypertension 04/24/2007    Past Surgical History:  Procedure Laterality Date  . ABDOMINAL HYSTERECTOMY    . HERNIA REPAIR  11/11   Incar. midline hernia  . INCISIONAL HERNIA REPAIR N/A 06/03/2014   Procedure: LAPAROSCOPIC REPAIR RECURRENT  INCISIONAL HERNIA;  Surgeon: Georganna Skeans, MD;  Location: [facility];  Service: General;  Laterality: N/A;  . INSERTION OF MESH N/A 06/03/2014   Procedure: INSERTION OF MESH;  Surgeon: Georganna Skeans, MD;  Location: [facility];  Service: General;  Laterality: N/A;  . TONSILLECTOMY      OB History   No obstetric history on file.      Home Medications    Prior to Admission medications   Medication Sig Start Date End Date Taking? Authorizing Provider  Accu-Chek FastClix Lancets MISC USE TO CHECK BLOOD SUGARS TWICE A DAY 10/09/18   Plotnikov, [name], MD  albuterol (VENTOLIN HFA) 108 (90 Base) MCG/ACT inhaler Inhale 1-2 puffs into the lungs every 6 (six) hours as needed for wheezing or shortness of breath. 12/24/18   Wieters, Hallie C, PA-C  apixaban (ELIQUIS) 2.5 MG  TABS tablet Take 1 tablet (2.5 mg total) by mouth 2 (two) times daily. Patient taking differently: Take 2.5 mg by mouth 2 (two) times daily.  08/21/18 12/03/18  Tish Men, MD  Ascorbic Acid (VITAMIN C) 1000 MG tablet Take 1,000 mg by mouth daily at 2 PM.    [provider]  BLACK CURRANT SEED OIL PO Take 1,250 mg by mouth daily at 2 PM.    [provider]  Blood Glucose Monitoring Suppl (ACCU-CHEK GUIDE) w/Device KIT 1 each by Does not apply route 2 (two) times daily. DX: E11.9 09/23/18    Plotnikov, [name], MD  CALCIUM PO Take 1,000 mg by mouth daily at 2 PM.    [provider]  cefdinir (OMNICEF) 300 MG capsule Take 1 capsule (300 mg total) by mouth 2 (two) times daily. 12/19/18   Plotnikov, [name], MD  Cholecalciferol (VITAMIN D3) 1000 UNITS CAPS Take 1,000 Units by mouth daily at 2 PM.     [provider]  ECHINACEA PO Take 1,250 mg by mouth daily at 2 PM.    [provider]  ELDERBERRY PO Take 5-10 mLs by mouth daily at 2 PM.    [provider]  fish oil-omega-3 fatty acids 1000 MG capsule Take 1 g by mouth daily at 2 PM.     [provider]  fluticasone (FLONASE) 50 MCG/ACT nasal spray Place 2 sprays into both nostrils daily. 08/19/18   Plotnikov, [name], MD  glucose blood (ACCU-CHEK GUIDE) test strip UUSE TO CHECK BLOOD SUGARS TWICE A DAY 09/25/18   Plotnikov, [name], MD  glucose monitoring kit (FREESTYLE) monitoring kit 1 each by Does not apply route 2 (two) times daily. Dx: E11.9, I10 08/19/18   Plotnikov, [name], MD  hydrochlorothiazide (MICROZIDE) 12.5 MG capsule Take 1 capsule (12.5 mg total) by mouth daily. 12/06/18   Hoyt Koch, MD  ipratropium-albuterol (DUONEB) 0.5-2.5 (3) MG/3ML SOLN Take 3 mLs by nebulization every 6 (six) hours as needed. Patient taking differently: Take 3 mLs by nebulization every 6 (six) hours as needed (shortness of breath).  02/20/18   Plotnikov, [name], MD  loratadine (CLARITIN) 10 MG tablet Take 1 tablet (10 mg total) by mouth daily. Patient taking differently: Take 10 mg by mouth every evening.  08/19/18   Plotnikov, [name], MD  Magnesium 400 MG TABS Take 400 mg by mouth daily at 2 PM.    [provider]  metFORMIN (GLUCOPHAGE) 500 MG tablet TAKE 1 TABLET BY MOUTH 3 TIMES DAILY Patient taking differently: Take 500 mg by mouth 2 (two) times daily with a meal.  05/13/18   Plotnikov, [name], MD  methylPREDNISolone (MEDROL DOSEPAK) 4 MG TBPK tablet As directed 12/19/18    Plotnikov, [name], MD  Methylsulfonylmethane (MSM PO) Take 1 tablet by mouth daily at 2 PM.     [provider]  OVER THE COUNTER MEDICATION Take 500 mg by mouth daily at 2 PM. Bitter Melon otc supplement    [provider]  Promethazine-Codeine 6.25-10 MG/5ML SOLN Take 5 mLs by mouth every 4 (four) hours as needed. 12/19/18   Plotnikov, [name], MD  Respiratory Therapy Supplies (NEBULIZER/TUBING/MOUTHPIECE) KIT every 4-6 hours 12/04/18   Plotnikov, [name], MD  TOUJEO SOLOSTAR 300 UNIT/ML SOPN INJECT 18 UNITS INTO THE SKIN EVERY MORNING. 07/11/18   Plotnikov, [name], MD  traZODone (DESYREL) 50 MG tablet 1/2 tab qhs prn insomnia Patient taking differently: Take 25 mg by mouth  at bedtime as needed for sleep.  11/29/18   Martyn Ehrich, NP  vitamin B-12 (CYANOCOBALAMIN) 500 MCG tablet Take 500 mcg by mouth daily at 2 PM.    [provider]    Family History Family History  Problem Relation Age of Onset  . Heart disease Mother 48       ?CAD  . Lung cancer Father   . Heart disease Brother 82       CAD  . Hypertension Other   . Colon cancer Neg Hx   . Esophageal cancer Neg Hx   . Stomach cancer Neg Hx   . Pancreatic cancer Neg Hx   . Liver disease Neg Hx     Social History Social History   Tobacco Use  . Smoking status: Never Smoker  . Smokeless tobacco: Never Used  Substance Use Topics  . Alcohol use: No  . Drug use: No     Allergies   Benazepril, Amlodipine, and Tetracycline   Review of Systems Review of Systems  Constitutional: Negative for activity change, appetite change, chills, fatigue and fever.  HENT: Positive for congestion. Negative for ear pain, rhinorrhea, sinus pressure, sore throat and trouble swallowing.   Eyes: Negative for discharge and redness.  Respiratory: Positive for cough, shortness of breath and wheezing. Negative for chest tightness.   Cardiovascular: Negative for chest pain.  Gastrointestinal: Negative for  abdominal pain, diarrhea, nausea and vomiting.  Musculoskeletal: Negative for myalgias.  Skin: Negative for rash.  Neurological: Negative for dizziness, light-headedness and headaches.     Physical Exam Triage Vital Signs ED Triage Vitals [12/24/18 1852]  Enc Vitals Group     BP (!) 187/99     Pulse Rate 96     Resp 18     Temp 98.6 F (37 C)     Temp Source Oral     SpO2 98 %     Weight      Height      Head Circumference      Peak Flow      Pain Score 0     Pain Loc      Pain Edu?      Excl. in [place]?    No data found.  Updated Vital Signs BP (!) 159/89   Pulse 96   Temp 98.6 F (37 C) (Oral)   Resp 18   SpO2 98%   Visual Acuity Right Eye Distance:   Left Eye Distance:   Bilateral Distance:    Right Eye Near:   Left Eye Near:    Bilateral Near:     Physical Exam Vitals signs and nursing note reviewed.  Constitutional:      General: She is not in acute distress.    Appearance: She is well-developed.  HENT:     Head: Normocephalic and atraumatic.     Ears:     Comments: Bilateral ears without tenderness to palpation of external auricle, tragus and mastoid, EAC's without erythema or swelling, TM's with good bony landmarks and cone of light. Non erythematous.    Mouth/Throat:     Comments: Oral mucosa pink and moist, no tonsillar enlargement or exudate. Posterior pharynx patent and nonerythematous, no uvula deviation or swelling. Normal phonation. Eyes:     Conjunctiva/sclera: Conjunctivae normal.  Neck:     Musculoskeletal: Neck supple.  Cardiovascular:     Rate and Rhythm: Normal rate and regular rhythm.     Heart sounds: No murmur.     Comments:  Expiratory wheezing that clears with coughing, coughs with deep inspiration, lungs otherwise clear without auscultation of other adventitious sounds Pulmonary:     Effort: Pulmonary effort is normal. No respiratory distress.     Breath sounds: Normal breath sounds.  Abdominal:     Palpations: Abdomen is soft.      Tenderness: There is no abdominal tenderness.  Skin:    General: Skin is warm and dry.  Neurological:     Mental Status: She is alert.      UC Treatments / Results  Labs (all labs ordered are listed, but only abnormal results are displayed) Labs Reviewed - No data to display  EKG   Radiology No results found.  Procedures Procedures (including critical care time)  Medications Ordered in UC Medications  dexamethasone (DECADRON) injection 10 mg (10 mg Intramuscular Given 12/24/18 1935)  dexamethasone (DECADRON) 10 MG/ML injection (has no administration in time range)    Initial Impression / Assessment and Plan / UC Course  I have reviewed the triage vital signs and the nursing notes.  Pertinent labs & imaging results that were available during my care of the patient were reviewed by me and considered in my medical decision making (see chart for details).  Clinical Course as of Dec 25 957  Tue Dec 24, 2018  1929 159/89   [HW]    Clinical Course User Index [HW] Wieters, Hallie C, [place]    Blood pressure rechecked and improved to 159/89.  Overall improving, but concerned about persistent wheezing.  Finishing course of prednisone.  Patient is diabetic, hesitant to restart another course of oral steroids, will provide one-time dose of Decadron 10 mg today in clinic will have finished antibiotic as well as use albuterol inhaler as needed for shortness of breath and wheezing.  Spacer provided.  Continue to monitor,Discussed strict return precautions. Patient verbalized understanding and is agreeable with plan.  Final Clinical Impressions(s) / UC Diagnoses   Final diagnoses:  Bronchitis     Discharge Instructions     We gave you a shot of Decadron (10 mg) Finish course of antibiotic Use albuterol inhaler as needed 1-2 puffs every 4-6 hours as needed for shortness of breath, wheezing, chest tightness   ED Prescriptions    Medication Sig Dispense Auth. Provider    albuterol (VENTOLIN HFA) 108 (90 Base) MCG/ACT inhaler Inhale 1-2 puffs into the lungs every 6 (six) hours as needed for wheezing or shortness of breath. 8 g Wieters, [place]. I. [place] C, PA-C   Spacer/Aero-Holding [place] DEVI 1 application by Does not apply route once for 1 dose. 1 each Wieters, Elesa Hacker, PA-C     Controlled Substance Prescriptions [place] Controlled Substance Registry consulted? Not Applicable   Janith Lima, [place] 12/26/18 1002

## 2018-12-26 NOTE — Telephone Encounter (Signed)
LM notifying pt she need to contact her insurance to see what is covered and let us know so we can send in correct medication

## 2018-12-27 ENCOUNTER — Telehealth (HOSPITAL_COMMUNITY): Payer: Self-pay

## 2018-12-27 MED ORDER — PROAIR HFA 108 (90 BASE) MCG/ACT IN AERS: 1.0000 | INHALATION_SPRAY | Freq: Four times a day (QID) | RESPIRATORY_TRACT | 0 refills | Status: DC | PRN

## 2018-12-27 NOTE — Telephone Encounter (Signed)
[id number]--IB number  proair hfa--DAW---covered alternative

## 2018-12-27 NOTE — Telephone Encounter (Signed)
Pt called requesting update on this medication. Please advise.

## 2018-12-30 ENCOUNTER — Telehealth: Payer: Self-pay | Admitting: Pulmonary Disease

## 2018-12-30 NOTE — Telephone Encounter (Signed)
Primary Pulmonologist: Elsworth Soho Last office visit and with whom: Beth NP on 6.12.2020 What do we see them for (pulmonary problems): Pneumonia, OSA, cough, SHOB  Reason for call: called spoke with patient who c/o bronchitis with prod cough with clear/thick yellow mucus, wheezing, tightness in chest, increased SHOB x4 weeks total.  Reports "can't smell."  Denies any head congestion, PND, f/c/s (does not have a thermometer at home), n/v/d, redness around the eyes, severe headache, new/unexplained muscle aches/weakness/rash.  Saw UC 2 weeks ago, given "steroid shot" and ProAir.  Reports ProAir makes her agitated.  Patient had visit with [facility] NP last month on 6.12.2020 with cxr.  Patient reports symptoms no better since the visit with [facility] NP or the most recent UC visit.  Offered MyChart video visit with APP today but patient declined, stating that she wishes to see no one except Dr Elsworth Soho; pt declined another MD as well.  Discussed with patient the need for a visit so that we may care for her symptoms but patient again declined.  Advised patient that if she wishes to not see an APP or other MD with this office, then to please try her PCP.  Advised patient that if she is unable to have visit with that office to please call us back so that we can take care of her needs.  Patient grateful and voiced her understanding.  Nothing further needed at this time; will sign off.   In the last month, have you been in contact with someone who was confirmed or suspected to have [place] / COVID-19?  no  Do you have any of the following symptoms developed in the last 30 days? Fever: unknown Cough: yes Shortness of breath: yes  When did your symptoms start?  4 weeks ago  If the patient has a fever, what is the last reading?  (use n/a if patient denies fever)  n/a . IF THE PATIENT STATES THEY DO NOT OWN A THERMOMETER, THEY MUST GO AND PURCHASE ONE When did the fever start?: n/a Have you taken any medication to  suppress a fever (ie Ibuprofen, Aleve, Tylenol)?: n/a  TRIAGE :: REMIND PATIENT TO SELF-ISOLATE WHILE THEY'RE MESSAGE IS BEING HANDLED  (examples of things to ask: : When did symptoms start? Fever? Cough? Productive? Color to sputum? More sputum than usual? Wheezing? Are you having any chest pain?  Have you needed increased oxygen? Are you taking your respiratory medications? What over the counter measures have you tried?)

## 2018-12-30 NOTE — Telephone Encounter (Signed)
Zigmund Gottron, NP changed RX

## 2019-01-01 NOTE — H&P (Signed)
TOTAL KNEE ADMISSION H&P  Patient is being admitted for right total knee arthroplasty.  Subjective:  Chief Complaint:right knee pain.  HPI: Sierra Johnson, 68 y.o. female, has a history of pain and functional disability in the right knee due to arthritis and has failed non-surgical conservative treatments for greater than 12 weeks to includecorticosteriod injections, viscosupplementation injections and activity modification.  Onset of symptoms was gradual, starting several years ago with gradually worsening course since that time. The patient noted prior procedures on the knee to include  arthroscopy on the right knee(s).  Patient currently rates pain in the right knee(s) at 7 out of 10 with activity. Patient has worsening of pain with activity and weight bearing, pain that interferes with activities of daily living, crepitus and joint swelling.  Patient has evidence of bone-on-bone arthritis in the medial and patellofemoral compartments of the right knee with significant varus deformity by imaging studies. There is no active infection.  Patient Active Problem List   Diagnosis Date Noted   History of community acquired pneumonia 11/29/2018   Sleep difficulties 11/29/2018   Preop exam for internal medicine 08/19/2018   Allergic rhinitis 08/19/2018   Hypoxemia 03/05/2018   Acute pulmonary embolism ([place]) 02/28/2018   Pulmonary embolus, right ([place]) 02/28/2018   Hyponatremia 02/28/2018   Cough 02/21/2018   Fever and chills 01/30/2018   Headache 01/30/2018   Asthmatic bronchitis 01/18/2018   Upper respiratory infection 04/27/2017   Insomnia 03/08/2016   Eczema 04/13/2015   Edema 02/11/2015   S/P laparoscopic hernia repair 06/03/2014   Knee osteoarthritis 04/30/2014   Recurrent ventral incisional hernia 10/16/2012   S/P repair of ventral hernia 08/28/2012   Diabetes mellitus type 2 in obese ([place]) 05/07/2012   Morbid obesity ([place]) 01/18/2012   Well adult exam  04/17/2011   Sinusitis, acute 04/17/2011   SHOULDER PAIN 12/07/2009   POLYURIA 12/07/2009   Obstructive sleep apnea 09/30/2009   FATIGUE 09/15/2009   PARESTHESIA 06/08/2008   Dyslipidemia 07/17/2007   B12 deficiency 04/24/2007   Anemia 04/24/2007   Essential hypertension 04/24/2007   Past Medical History:  Diagnosis Date   ANEMIA-NOS 04/24/2007   Arthritis    DIABETES MELLITUS, TYPE II 04/24/2007   Elevated glucose 2011   HYPERLIPIDEMIA 07/17/2007   mild   HYPERTENSION 04/24/2007   Menopause 1998   Dr. Gaetano Net   OBSTRUCTIVE SLEEP APNEA 09/30/2009   on CPAP 2011   VITAMIN B12 DEFICIENCY 04/24/2007    Past Surgical History:  Procedure Laterality Date   ABDOMINAL HYSTERECTOMY     HERNIA REPAIR  11/11   Incar. midline hernia   INCISIONAL HERNIA REPAIR N/A 06/03/2014   Procedure: LAPAROSCOPIC REPAIR RECURRENT  INCISIONAL HERNIA;  Surgeon: Georganna Skeans, MD;  Location: [facility] OR;  Service: General;  Laterality: N/A;   INSERTION OF MESH N/A 06/03/2014   Procedure: INSERTION OF MESH;  Surgeon: Georganna Skeans, MD;  Location: [facility] OR;  Service: General;  Laterality: N/A;   TONSILLECTOMY      No current facility-administered medications for this encounter.    Current Outpatient Medications  Medication Sig Dispense Refill Last Dose   Accu-Chek FastClix Lancets MISC USE TO CHECK BLOOD SUGARS TWICE A DAY 102 each 0 Taking   albuterol (VENTOLIN HFA) 108 (90 Base) MCG/ACT inhaler Inhale 1-2 puffs into the lungs every 6 (six) hours as needed for wheezing or shortness of breath. 8 g 0    apixaban (ELIQUIS) 2.5 MG TABS tablet Take 1 tablet (2.5 mg total) by mouth 2 (two)  times daily. (Patient taking differently: Take 2.5 mg by mouth 2 (two) times daily. ) 180 tablet 3 Past Week   Ascorbic Acid (VITAMIN C) 1000 MG tablet Take 1,000 mg by mouth daily at 2 PM.   Taking   BLACK CURRANT SEED OIL PO Take 1,250 mg by mouth daily at 2 PM.   Taking   Blood Glucose Monitoring  Suppl (ACCU-CHEK GUIDE) w/Device KIT 1 each by Does not apply route 2 (two) times daily. DX: E11.9 1 kit 0 Taking   CALCIUM PO Take 1,000 mg by mouth daily at 2 PM.   Taking   cefdinir (OMNICEF) 300 MG capsule Take 1 capsule (300 mg total) by mouth 2 (two) times daily. 20 capsule 0    Cholecalciferol (VITAMIN D3) 1000 UNITS CAPS Take 1,000 Units by mouth daily at 2 PM.    Taking   ECHINACEA PO Take 1,250 mg by mouth daily at 2 PM.   Taking   ELDERBERRY PO Take 5-10 mLs by mouth daily at 2 PM.   Taking   fish oil-omega-3 fatty acids 1000 MG capsule Take 1 g by mouth daily at 2 PM.    Taking   fluticasone (FLONASE) 50 MCG/ACT nasal spray Place 2 sprays into both nostrils daily. 16 g 6 Taking   glucose blood (ACCU-CHEK GUIDE) test strip UUSE TO CHECK BLOOD SUGARS TWICE A DAY 200 each 3 Taking   glucose monitoring kit (FREESTYLE) monitoring kit 1 each by Does not apply route 2 (two) times daily. Dx: E11.9, I10 1 each 0 Taking   hydrochlorothiazide (MICROZIDE) 12.5 MG capsule Take 1 capsule (12.5 mg total) by mouth daily. 30 capsule 1    ipratropium-albuterol (DUONEB) 0.5-2.5 (3) MG/3ML SOLN Take 3 mLs by nebulization every 6 (six) hours as needed. (Patient taking differently: Take 3 mLs by nebulization every 6 (six) hours as needed (shortness of breath). ) 360 mL 1 Taking   loratadine (CLARITIN) 10 MG tablet Take 1 tablet (10 mg total) by mouth daily. (Patient taking differently: Take 10 mg by mouth every evening. ) 30 tablet 11 Taking   Magnesium 400 MG TABS Take 400 mg by mouth daily at 2 PM.   Taking   metFORMIN (GLUCOPHAGE) 500 MG tablet TAKE 1 TABLET BY MOUTH 3 TIMES DAILY (Patient taking differently: Take 500 mg by mouth 2 (two) times daily with a meal. ) 270 tablet 3 Taking   methylPREDNISolone (MEDROL DOSEPAK) 4 MG TBPK tablet As directed 21 tablet 0    Methylsulfonylmethane (MSM PO) Take 1 tablet by mouth daily at 2 PM.    Taking   OVER THE COUNTER MEDICATION Take 500 mg by  mouth daily at 2 PM. Bitter Melon otc supplement   Taking   PROAIR HFA 108 (90 Base) MCG/ACT inhaler Inhale 1-2 puffs into the lungs every 6 (six) hours as needed for wheezing or shortness of breath. 8 g 0    Promethazine-Codeine 6.25-10 MG/5ML SOLN Take 5 mLs by mouth every 4 (four) hours as needed. 240 mL 0    Respiratory Therapy Supplies (NEBULIZER/TUBING/MOUTHPIECE) KIT every 4-6 hours 1 each 3 Taking   TOUJEO SOLOSTAR 300 UNIT/ML SOPN INJECT 18 UNITS INTO THE SKIN EVERY MORNING. 13.5 pen 9 Taking   traZODone (DESYREL) 50 MG tablet 1/2 tab qhs prn insomnia (Patient taking differently: Take 25 mg by mouth at bedtime as needed for sleep. ) 15 tablet 0 Taking   vitamin B-12 (CYANOCOBALAMIN) 500 MCG tablet Take 500 mcg by mouth daily at 2  PM.   Taking   Allergies  Allergen Reactions   Benazepril Swelling and Other (See Comments)    Lip swelling   Amlodipine Swelling and Other (See Comments)    LE swelling on 10 mg   Tetracycline Nausea And Vomiting    Social History   Tobacco Use   Smoking status: Never Smoker   Smokeless tobacco: Never Used  Substance Use Topics   Alcohol use: No    Family History  Problem Relation Age of Onset   Heart disease Mother 39       ?CAD   Lung cancer Father    Heart disease Brother 12       CAD   Hypertension Other    Colon cancer Neg Hx    Esophageal cancer Neg Hx    Stomach cancer Neg Hx    Pancreatic cancer Neg Hx    Liver disease Neg Hx      Review of Systems  Constitutional: Negative for chills and fever.  HENT: Negative for congestion, sore throat and tinnitus.   Eyes: Negative for double vision, photophobia and pain.  Respiratory: Negative for cough, shortness of breath and wheezing.   Cardiovascular: Negative for chest pain, palpitations and orthopnea.  Gastrointestinal: Negative for heartburn, nausea and vomiting.  Genitourinary: Negative for dysuria, frequency and urgency.  Musculoskeletal: Positive for joint  pain.  Neurological: Negative for dizziness, weakness and headaches.    Objective:  Physical Exam  Well nourished and well developed.  General: Alert and oriented x3, cooperative and pleasant, no acute distress.  Head: normocephalic, atraumatic, neck supple.  Eyes: EOMI.  Respiratory: breath sounds clear in all fields, no wheezing, rales, or rhonchi. Cardiovascular: Regular rate and rhythm, no murmurs, gallops or rubs.  Abdomen: non-tender to palpation and soft, normoactive bowel sounds. Musculoskeletal:  Right Knee Exam:  No effusion.  Significant varus deformity. Range of motion is 0-125 degrees.  No crepitus on range of motion of the knee.  No medial joint line tenderness. No lateral joint line tenderness.  Slight pseudo-laxity when correcting varus alignment to neutral. No AP laxity. No gross instability.  Calves soft and nontender. Motor function intact in LE. Strength 5/5 LE bilaterally. Neuro: Distal pulses 2+. Sensation to light touch intact in LE.   Vital signs in last 24 hours: Blood pressure: 122/84 mmHg Pulse: 76 bpm  Labs:   Estimated body mass index is 39.26 kg/m as calculated from the following:   Height as of 12/06/18: 5' (1.524 m).   Weight as of 12/06/18: 91.2 kg.   Imaging Review Plain radiographs demonstrate severe degenerative joint disease of the right knee(s). The overall alignment issignificant varus. The bone quality appears to be adequate for age and reported activity level.  Assessment/Plan:  End stage arthritis, right knee   The patient history, physical examination, clinical judgment of the provider and imaging studies are consistent with end stage degenerative joint disease of the right knee(s) and total knee arthroplasty is deemed medically necessary. The treatment options including medical management, injection therapy arthroscopy and arthroplasty were discussed at length. The risks and benefits of total knee arthroplasty were presented  and reviewed. The risks due to aseptic loosening, infection, stiffness, patella tracking problems, thromboembolic complications and other imponderables were discussed. The patient acknowledged the explanation, agreed to proceed with the plan and consent was signed. Patient is being admitted for inpatient treatment for surgery, pain control, PT, OT, prophylactic antibiotics, VTE prophylaxis, progressive ambulation and ADL's and discharge planning. The patient is  planning to be discharged home.  Anticipated LOS equal to or greater than 2 midnights due to - Age 78 and older with one or more of the following:  - Obesity  - Expected need for hospital services (PT, OT, Nursing) required for safe  discharge  - Anticipated need for postoperative skilled nursing care or inpatient rehab  - Active co-morbidities: Diabetes and DVT/VTE OR   - Unanticipated findings during/Post Surgery: None  - Patient is a high risk of re-admission due to: None  Therapy Plans: Outpatient therapy at [facility] Disposition: Home with husband Planned DVT Prophylaxis: Eliquis (takes 2.5 mg BID for hx PE) DME needed: Gilford Rile, 3-in-1 PCP: Dr. Alain Marion Hematologist: Dr. Maylon Peppers TXA: Topical (hx PE) Allergies: Tetracycline (nausea) Anesthesia Concerns: None BMI: 39.5 Last HgbA1c: 6.3% Other: BMI is 39.5 today, she will continue watching her weight. Informed patient that BMI cannot be greater than 40.0 prior to surgery to avoid greater risks of complications.  - Patient was instructed on what medications to stop prior to surgery. - Follow-up visit in 2 weeks with Dr. Wynelle Link - Begin physical therapy following surgery - Pre-operative lab work as pre-surgical testing - Prescriptions will be provided in hospital at time of discharge  Theresa Duty, PA-C Orthopedic Surgery [facility] [place]

## 2019-01-15 ENCOUNTER — Other Ambulatory Visit: Payer: Self-pay | Admitting: Internal Medicine

## 2019-01-22 NOTE — Patient Instructions (Addendum)
YOU NEED TO HAVE A COVID 19 TEST ON__Thurs 8/6_____ @__2 :45 pm_____, THIS TEST MUST BE DONE BEFORE SURGERY, COME  [address], [place] , [id number]. ONCE YOUR COVID TEST IS COMPLETED, PLEASE BEGIN THE QUARANTINE INSTRUCTIONS AS OUTLINED IN YOUR HANDOUT.                Sierra Johnson    Your procedure is scheduled on: Monday 01/27/19   Report to [facility] Main  Entrance Report to admitting at 5:50 AM   1 VISITOR IS ALLOWED TO WAIT IN WAITING ROOM  ONLY DAY OF YOUR SURGERY.  No family in short stay at this time   Call this number if you have problems the morning of surgery [phone]    . BRUSH YOUR TEETH MORNING OF SURGERY AND RINSE YOUR MOUTH OUT, NO CHEWING GUM CANDY OR MINTS.   Do not eat food After Midnight . YOU MAY HAVE CLEAR LIQUIDS FROM MIDNIGHT UNTIL 4:30AM . At 4:30AM Please finish the prescribed Pre-Surgery Gatorade drink.  Nothing by mouth after you finish the Gatorade drink !   Take these medicines the morning of surgery with A SIP OF WATER: Claritin Inhaler. Bring to the hospital with you  Bring mask and tubing  DO NOT TAKE ANY DIABETIC MEDICATIONS DAY OF YOUR SURGERY   How to Manage Your Diabetes Before and After Surgery  Why is it important to control my blood sugar before and after surgery? . Improving blood sugar levels before and after surgery helps healing and can limit problems. . A way of improving blood sugar control is eating a healthy diet by: o  Eating less sugar and carbohydrates o  Increasing activity/exercise o  Talking with your doctor about reaching your blood sugar goals . High blood sugars (greater than 180 mg/dL) can raise your risk of infections and slow your recovery, so you will need to focus on controlling your diabetes during the weeks before surgery. . Make sure that the doctor who takes care of your diabetes knows about your planned surgery including the date and location.  How do I manage my blood sugar before  surgery? . Check your blood sugar at least 4 times a day, starting 2 days before surgery, to make sure that the level is not too high or low. o Check your blood sugar the morning of your surgery when you wake up and every 2 hours until you get to the Short Stay unit. . If your blood sugar is less than 70 mg/dL, you will need to treat for low blood sugar: o Do not take insulin. o Treat a low blood sugar (less than 70 mg/dL) with  cup of clear juice (cranberry or apple), 4 glucose tablets, OR glucose gel. o Recheck blood sugar in 15 minutes after treatment (to make sure it is greater than 70 mg/dL). If your blood sugar is not greater than 70 mg/dL on recheck, call [phone] for further instructions. . Report your blood sugar to the short stay nurse when you get to Short Stay.  . If you are admitted to the hospital after surgery: o Your blood sugar will be checked by the staff and you will probably be given insulin after surgery (instead of oral diabetes medicines) to make sure you have good blood sugar levels. o The goal for blood sugar control after surgery is 80-180 mg/dL.   WHAT DO I DO ABOUT MY DIABETES MEDICATION?  [name] Do not take oral diabetes medicines (pills) the  morning of surgery.      . THE MORNING OF SURGERY, take 10   units of   Joujeo       Insulin.( if needed)  . The day of surgery, do not take other diabetes injectables, including Byetta (exenatide), Bydureon (exenatide ER), Victoza (liraglutide), or Trulicity (dulaglutide).                  You may not have any metal on your body including hair pins and              piercings               Do not wear jewelry, make-up, lotions, powders or perfumes, deodorant             Do not wear nail polish.  Do not shave  48 hours prior to surgery.            Do not bring valuables to the hospital. [place].  Contacts, dentures or bridgework may not be worn into  surgery.        Name and phone number of your driver:  Special Instructions: N/A             [facility] - Preparing for Surgery Before surgery, you can play an important role.   Because skin is not sterile, your skin needs to be as free of germs as possible.   You can reduce the number of germs on your skin by washing with CHG (chlorahexidine gluconate) soap before surgery .  CHG is an antiseptic cleaner which kills germs and bonds with the skin to continue killing germs even after washing. Please DO NOT use if you have an allergy to CHG or antibacterial soaps.   If your skin becomes reddened/irritated stop using the CHG and inform your nurse when you arrive at Short Stay. Do not shave (including legs and underarms) for at least 48 hours prior to the first CHG shower.  Please follow these instructions carefully:  1.  Shower with CHG Soap the night before surgery and the  morning of Surgery.  2.  If you choose to wash your hair, wash your hair first as usual with your  normal  shampoo.  3.  After you shampoo, rinse your hair and body thoroughly to remove the  shampoo.                                        4.  Use CHG as you would any other liquid soap.  You can apply chg directly  to the skin and wash                       Gently with a scrungie or clean washcloth.  5.  Apply the CHG Soap to your body ONLY FROM THE NECK DOWN.   Do not use on face/ open                           Wound or open sores. Avoid contact with eyes, ears mouth and genitals (private parts).                       Wash face,  Genitals (  private parts) with your normal soap.             6.  Wash thoroughly, paying special attention to the area where your surgery  will be performed.  7.  Thoroughly rinse your body with warm water from the neck down.  8.  DO NOT shower/wash with your normal soap after using and rinsing off  the CHG Soap.             9.  Pat yourself dry with a clean towel.            10.  Wear clean  pajamas.            11.  Place clean sheets on your bed the night of your first shower and do not  sleep with pets. Day of Surgery : Do not apply any lotions/deodorants the morning of surgery.  Please wear clean clothes to the hospital/surgery center.  FAILURE TO FOLLOW THESE INSTRUCTIONS MAY RESULT IN THE CANCELLATION OF YOUR SURGERY PATIENT SIGNATURE_________________________________  NURSE SIGNATURE__________________________________  ________________________________________________________________________   Adam Phenix  An incentive spirometer is a tool that can help keep your lungs clear and active. This tool measures how well you are filling your lungs with each breath. Taking long deep breaths may help reverse or decrease the chance of developing breathing (pulmonary) problems (especially infection) following:  A long period of time when you are unable to move or be active. BEFORE THE PROCEDURE   If the spirometer includes an indicator to show your best effort, your nurse or respiratory therapist will set it to a desired goal.  If possible, sit up straight or lean slightly forward. Try not to slouch.  Hold the incentive spirometer in an upright position. INSTRUCTIONS FOR USE  1. Sit on the edge of your bed if possible, or sit up as far as you can in bed or on a chair. 2. Hold the incentive spirometer in an upright position. 3. Breathe out normally. 4. Place the mouthpiece in your mouth and seal your lips tightly around it. 5. Breathe in slowly and as deeply as possible, raising the piston or the ball toward the top of the column. 6. Hold your breath for 3-5 seconds or for as long as possible. Allow the piston or ball to fall to the bottom of the column. 7. Remove the mouthpiece from your mouth and breathe out normally. 8. Rest for a few seconds and repeat Steps 1 through 7 at least 10 times every 1-2 hours when you are awake. Take your time and take a few normal breaths  between deep breaths. 9. The spirometer may include an indicator to show your best effort. Use the indicator as a goal to work toward during each repetition. 10. After each set of 10 deep breaths, practice coughing to be sure your lungs are clear. If you have an incision (the cut made at the time of surgery), support your incision when coughing by placing a pillow or rolled up towels firmly against it. Once you are able to get out of bed, walk around indoors and cough well. You may stop using the incentive spirometer when instructed by your caregiver.  RISKS AND COMPLICATIONS  Take your time so you do not get dizzy or light-headed.  If you are in pain, you may need to take or ask for pain medication before doing incentive spirometry. It is harder to take a deep breath if you are having pain. AFTER USE  Rest and breathe  slowly and easily.  It can be helpful to keep track of a log of your progress. Your caregiver can provide you with a simple table to help with this. If you are using the spirometer at home, follow these instructions: [place] IF:   You are having difficultly using the spirometer.  You have trouble using the spirometer as often as instructed.  Your pain medication is not giving enough relief while using the spirometer.  You develop fever of 100.5 F (38.1 C) or higher. SEEK IMMEDIATE MEDICAL CARE IF:   You cough up bloody sputum that had not been present before.  You develop fever of 102 F (38.9 C) or greater.  You develop worsening pain at or near the incision site. MAKE SURE YOU:   Understand these instructions.  Will watch your condition.  Will get help right away if you are not doing well or get worse. Document Released: 10/16/2006 Document Revised: 08/28/2011 Document Reviewed: 12/17/2006 ExitCare Patient Information 2014 ExitCare, [place].   ________________________________________________________________________  WHAT IS A BLOOD TRANSFUSION?  Blood Transfusion Information  A transfusion is the replacement of blood or some of its parts. Blood is made up of multiple cells which provide different functions.  Red blood cells carry oxygen and are used for blood loss replacement.  White blood cells fight against infection.  Platelets control bleeding.  Plasma helps clot blood.  Other blood products are available for specialized needs, such as hemophilia or other clotting disorders. BEFORE THE TRANSFUSION  Who gives blood for transfusions?   Healthy volunteers who are fully evaluated to make sure their blood is safe. This is blood bank blood. Transfusion therapy is the safest it has ever been in the practice of medicine. Before blood is taken from a donor, a complete history is taken to make sure that person has no history of diseases nor engages in risky social behavior (examples are intravenous drug use or sexual activity with multiple partners). The donor's travel history is screened to minimize risk of transmitting infections, such as malaria. The donated blood is tested for signs of infectious diseases, such as HIV and hepatitis. The blood is then tested to be sure it is compatible with you in order to minimize the chance of a transfusion reaction. If you or a relative donates blood, this is often done in anticipation of surgery and is not appropriate for emergency situations. It takes many days to process the donated blood. RISKS AND COMPLICATIONS Although transfusion therapy is very safe and saves many lives, the main dangers of transfusion include:   Getting an infectious disease.  Developing a transfusion reaction. This is an allergic reaction to something in the blood you were given. Every precaution is taken to prevent this. The decision to have a blood transfusion has been considered carefully by your caregiver before blood is given. Blood is not given unless the benefits outweigh the risks. AFTER THE TRANSFUSION  Right  after receiving a blood transfusion, you will usually feel much better and more energetic. This is especially true if your red blood cells have gotten low (anemic). The transfusion raises the level of the red blood cells which carry oxygen, and this usually causes an energy increase.  The nurse administering the transfusion will monitor you carefully for complications. HOME CARE INSTRUCTIONS  No special instructions are needed after a transfusion. You may find your energy is better. Speak with your caregiver about any limitations on activity for underlying diseases you may have. [place]  IF:   Your condition is not improving after your transfusion.  You develop redness or irritation at the intravenous (IV) site. SEEK IMMEDIATE MEDICAL CARE IF:  Any of the following symptoms occur over the next 12 hours:  Shaking chills.  You have a temperature by mouth above 102 F (38.9 C), not controlled by medicine.  Chest, back, or muscle pain.  People around you feel you are not acting correctly or are confused.  Shortness of breath or difficulty breathing.  Dizziness and fainting.  You get a rash or develop hives.  You have a decrease in urine output.  Your urine turns a dark color or changes to pink, red, or brown. Any of the following symptoms occur over the next 10 days:  You have a temperature by mouth above 102 F (38.9 C), not controlled by medicine.  Shortness of breath.  Weakness after normal activity.  The white part of the eye turns yellow (jaundice).  You have a decrease in the amount of urine or are urinating less often.  Your urine turns a dark color or changes to pink, red, or brown. Document Released: 06/02/2000 Document Revised: 08/28/2011 Document Reviewed: 01/20/2008 [facility] Patient Information 2014 [place], [place].  _______________________________________________________________________

## 2019-01-23 ENCOUNTER — Other Ambulatory Visit (HOSPITAL_COMMUNITY): Payer: Medicare Other

## 2019-01-23 ENCOUNTER — Other Ambulatory Visit (HOSPITAL_COMMUNITY)
Admission: RE | Admit: 2019-01-23 | Discharge: 2019-01-23 | Disposition: A | Discharge status: Home / Self Care | Payer: Medicare Other | Source: Ambulatory Visit | Attending: Orthopedic Surgery | Admitting: Orthopedic Surgery

## 2019-01-23 ENCOUNTER — Other Ambulatory Visit: Payer: Self-pay

## 2019-01-23 ENCOUNTER — Encounter (HOSPITAL_COMMUNITY)
Admission: RE | Admit: 2019-01-23 | Discharge: 2019-01-23 | Disposition: A | Discharge status: Home / Self Care | Payer: Medicare Other | Source: Ambulatory Visit | Attending: Orthopedic Surgery | Admitting: Orthopedic Surgery

## 2019-01-23 ENCOUNTER — Encounter (HOSPITAL_COMMUNITY): Payer: Self-pay

## 2019-01-23 DIAGNOSIS — E119 Type 2 diabetes mellitus without complications: Secondary | ICD-10-CM | POA: Insufficient documentation

## 2019-01-23 DIAGNOSIS — G4733 Obstructive sleep apnea (adult) (pediatric): Secondary | ICD-10-CM | POA: Diagnosis not present

## 2019-01-23 DIAGNOSIS — M1711 Unilateral primary osteoarthritis, right knee: Secondary | ICD-10-CM | POA: Diagnosis not present

## 2019-01-23 DIAGNOSIS — Z7951 Long term (current) use of inhaled steroids: Secondary | ICD-10-CM | POA: Diagnosis not present

## 2019-01-23 DIAGNOSIS — Z7901 Long term (current) use of anticoagulants: Secondary | ICD-10-CM | POA: Diagnosis not present

## 2019-01-23 DIAGNOSIS — Z01818 Encounter for other preprocedural examination: Secondary | ICD-10-CM | POA: Diagnosis not present

## 2019-01-23 DIAGNOSIS — I10 Essential (primary) hypertension: Secondary | ICD-10-CM | POA: Insufficient documentation

## 2019-01-23 DIAGNOSIS — E785 Hyperlipidemia, unspecified: Secondary | ICD-10-CM | POA: Insufficient documentation

## 2019-01-23 DIAGNOSIS — J45909 Unspecified asthma, uncomplicated: Secondary | ICD-10-CM | POA: Diagnosis not present

## 2019-01-23 DIAGNOSIS — Z20828 Contact with and (suspected) exposure to other viral communicable diseases: Secondary | ICD-10-CM | POA: Diagnosis not present

## 2019-01-23 DIAGNOSIS — E538 Deficiency of other specified B group vitamins: Secondary | ICD-10-CM | POA: Diagnosis not present

## 2019-01-23 DIAGNOSIS — Z86711 Personal history of pulmonary embolism: Secondary | ICD-10-CM | POA: Diagnosis not present

## 2019-01-23 DIAGNOSIS — Z79899 Other long term (current) drug therapy: Secondary | ICD-10-CM | POA: Diagnosis not present

## 2019-01-23 DIAGNOSIS — Z7984 Long term (current) use of oral hypoglycemic drugs: Secondary | ICD-10-CM | POA: Insufficient documentation

## 2019-01-23 HISTORY — DX: Pneumonia, unspecified organism: J18.9

## 2019-01-23 LAB — HEMOGLOBIN A1C
Hgb A1c MFr Bld: 6.2 % — ABNORMAL HIGH (ref 4.8–5.6)
Mean Plasma Glucose: 131.24 mg/dL

## 2019-01-23 LAB — COMPREHENSIVE METABOLIC PANEL
ALT: 19 U/L (ref 0–44)
AST: 23 U/L (ref 15–41)
Albumin: 3.8 g/dL (ref 3.5–5.0)
Alkaline Phosphatase: 81 U/L (ref 38–126)
Anion gap: 11 (ref 5–15)
BUN: 11 mg/dL (ref 8–23)
CO2: 27 mmol/L (ref 22–32)
Calcium: 10 mg/dL (ref 8.9–10.3)
Chloride: 101 mmol/L (ref 98–111)
Creatinine, Ser: 0.98 mg/dL (ref 0.44–1.00)
GFR calc Af Amer: >60 mL/min (ref >60)
GFR calc non Af Amer: >60 mL/min (ref >60)
Glucose, Bld: 105 mg/dL — ABNORMAL HIGH (ref 70–99)
Potassium: 3.7 mmol/L (ref 3.5–5.1)
Sodium: 139 mmol/L (ref 135–145)
Total Bilirubin: 0.5 mg/dL (ref 0.3–1.2)
Total Protein: 8.3 g/dL — ABNORMAL HIGH (ref 6.5–8.1)

## 2019-01-23 LAB — PROTIME-INR
INR: 1 (ref 0.8–1.2)
Prothrombin Time: 12.8 seconds (ref 11.4–15.2)

## 2019-01-23 LAB — CBC
HCT: 43.3 % (ref 36.0–46.0)
Hemoglobin: 13.5 g/dL (ref 12.0–15.0)
MCH: 27.4 pg (ref 26.0–34.0)
MCHC: 31.2 g/dL (ref 30.0–36.0)
MCV: 88 fL (ref 80.0–100.0)
Platelets: 420 10*3/uL — ABNORMAL HIGH (ref 150–400)
RBC: 4.92 MIL/uL (ref 3.87–5.11)
RDW: 15.5 % (ref 11.5–15.5)
WBC: 10.9 10*3/uL — ABNORMAL HIGH (ref 4.0–10.5)
nRBC: 0 % (ref 0.0–0.2)

## 2019-01-23 LAB — GLUCOSE, CAPILLARY: Glucose-Capillary: 93 mg/dL (ref 70–99)

## 2019-01-23 LAB — SURGICAL PCR SCREEN
MRSA, PCR: NEGATIVE
Staphylococcus aureus: NEGATIVE

## 2019-01-23 LAB — APTT: aPTT: 33 seconds (ref 24–36)

## 2019-01-23 LAB — SARS CORONAVIRUS 2 (TAT 6-24 HRS): SARS Coronavirus 2: NEGATIVE

## 2019-01-23 NOTE — Progress Notes (Signed)
Dr. Anne Fu office instructed Pt to stop Elaqis on Saturday 8/8. Her PCP manages her meds. She has not needed to take Joujeo for "a while". Pt was told to keep close track of her CBGs and use Joujeo if needed but not to take oral diabetic meds on the day of surgery.

## 2019-01-24 NOTE — Progress Notes (Signed)
Anesthesia Chart Review   Case: [id number] Date/Time: 01/27/19 0805   Procedure: TOTAL KNEE ARTHROPLASTY (Right ) - 23mn   Anesthesia type: Choice   Pre-op diagnosis: right knee osteoarthritis   [facility] ROOM 10 / [facility]   Surgeon: AGaynelle Arabian MD      DISCUSSION:67 y.o. never smoker with h/o OSA on CPAP, HLD, DM II, HTN, anemia, asthma, PE (on Eliquis), right knee OA scheduled for above procedure 01/27/2019 with Dr. FGaynelle Arabian   Surgery previously scheduled for 12/09/2018, advised to follow up with PCP due to uncontrolled HTN at PAT visit.  She has since been seen by PCP 12/06/2018.  HCTZ prescribed.  Dr. CSharlet Salinarecommended delaying surgery for 1-2 weeks due to a flare up of bronchitis.  Per nurse pt stable at PAT visit.    Prior to rescheduling she was cleared by Dr. PAlain Marion "Mrs. FBorakis medically clear for right total knee replacement.  Please discontinue and resume Eliquis per your protocol.  She will have to continue Eliquis indefinitely for her history of unprovoked PE.  Use of inhalers perioperatively.  Monitor blood sugars perioperatively."  Advised by Dr. AWynelle Linkto hold Eliquis 2 days prior to procedure.  Anticipate pt can proceed with planned procedure barring acute status change.   VS: BP 135/83   Pulse 71   Temp 36.8 C (Oral)   Resp 20   Ht 5' (1.524 m)   Wt 89.8 kg   SpO2 100%   BMI 38.67 kg/m   PROVIDERS: Plotnikov, AEvie Lacks MD is PCP    LABS: Labs reviewed: Acceptable for surgery. (all labs ordered are listed, but only abnormal results are displayed)  Labs Reviewed  CBC - Abnormal; Notable for the following components:      Result Value   WBC 10.9 (*)    Platelets 420 (*)    All other components within normal limits  COMPREHENSIVE METABOLIC PANEL - Abnormal; Notable for the following components:   Glucose, Bld 105 (*)    Total Protein 8.3 (*)    All other components within normal limits  HEMOGLOBIN A1C - Abnormal; Notable for the  following components:   Hgb A1c MFr Bld 6.2 (*)    All other components within normal limits  SURGICAL PCR SCREEN  APTT  PROTIME-INR  GLUCOSE, CAPILLARY  TYPE AND SCREEN     IMAGES: Chest Xray 11/29/2018 FINDINGS: There is mild haziness in the left base, only seen on the frontal view. The focal infiltrate seen in the left base in 2019 is no longer visualized. The heart, hila, mediastinum, lungs, and pleura are otherwise normal.  IMPRESSION: Minimal haziness in the left base only on the frontal view favored to represent atelectasis. Interval resolution of the previous focal infiltrate in the left retrocardiac region.  EKG: 02/28/18 Rate 87 bpm Normal Sinus rhythm   CV: Echo 03/01/18 Study Conclusions  - Left ventricle: The cavity size was normal. Systolic function was   normal. The estimated ejection fraction was in the range of 60%   to 65%. Wall motion was normal; there were no regional wall   motion abnormalities. Doppler parameters are consistent with   abnormal left ventricular relaxation (grade 1 diastolic   dysfunction). Past Medical History:  Diagnosis Date  . ANEMIA-NOS 04/24/2007  . Arthritis   . DIABETES MELLITUS, TYPE II 04/24/2007  . Elevated glucose 2011  . HYPERLIPIDEMIA 07/17/2007   mild  . HYPERTENSION 04/24/2007  . Menopause 1998   Dr. TGaetano Net . OBSTRUCTIVE  SLEEP APNEA 09/30/2009   on CPAP 2011  . Pneumonia    chronic Brochitis and use inhaler  . VITAMIN B12 DEFICIENCY 04/24/2007    Past Surgical History:  Procedure Laterality Date  . ABDOMINAL HYSTERECTOMY    . HERNIA REPAIR  11/11   Incar. midline hernia  . INCISIONAL HERNIA REPAIR N/A 06/03/2014   Procedure: LAPAROSCOPIC REPAIR RECURRENT  INCISIONAL HERNIA;  Surgeon: Georganna Skeans, MD;  Location: [facility];  Service: General;  Laterality: N/A;  . INSERTION OF MESH N/A 06/03/2014   Procedure: INSERTION OF MESH;  Surgeon: Georganna Skeans, MD;  Location: [place];  Service: General;  Laterality:  N/A;  . TONSILLECTOMY      MEDICATIONS: . Accu-Chek FastClix Lancets MISC  . albuterol (VENTOLIN HFA) 108 (90 Base) MCG/ACT inhaler  . apixaban (ELIQUIS) 2.5 MG TABS tablet  . Ascorbic Acid (VITAMIN C) 1000 MG tablet  . BLACK CURRANT SEED OIL PO  . Blood Glucose Monitoring Suppl (ACCU-CHEK GUIDE) w/Device KIT  . CALCIUM PO  . cefdinir (OMNICEF) 300 MG capsule  . Cholecalciferol (VITAMIN D3) 1000 UNITS CAPS  . ECHINACEA PO  . ELDERBERRY PO  . Flaxseed, Linseed, (FLAX SEED OIL PO)  . fluticasone (FLONASE) 50 MCG/ACT nasal spray  . glucose blood (ACCU-CHEK GUIDE) test strip  . glucose monitoring kit (FREESTYLE) monitoring kit  . hydrochlorothiazide (MICROZIDE) 12.5 MG capsule  . ipratropium-albuterol (DUONEB) 0.5-2.5 (3) MG/3ML SOLN  . loratadine (CLARITIN) 10 MG tablet  . Magnesium 400 MG TABS  . metFORMIN (GLUCOPHAGE) 500 MG tablet  . methylPREDNISolone (MEDROL DOSEPAK) 4 MG TBPK tablet  . Omega-3 1000 MG CAPS  . Promethazine-Codeine 6.25-10 MG/5ML SOLN  . Respiratory Therapy Supplies (NEBULIZER/TUBING/MOUTHPIECE) KIT  . TOUJEO SOLOSTAR 300 UNIT/ML SOPN  . traZODone (DESYREL) 50 MG tablet  . vitamin B-12 (CYANOCOBALAMIN) 500 MCG tablet   No current facility-administered medications for this encounter.    Maia Plan [facility] Pre-Surgical Testing [phone] 01/24/19  12:28 PM

## 2019-01-24 NOTE — Anesthesia Preprocedure Evaluation (Addendum)
Anesthesia Evaluation  Patient identified by MRN, date of birth, ID band Patient awake    Reviewed: Allergy & Precautions, NPO status , Patient's Chart, lab work & pertinent test results  Airway Mallampati: II  TM Distance: >3 FB Neck ROM: Full    Dental no notable dental hx. (+) Teeth Intact, Dental Advisory Given   Pulmonary asthma , sleep apnea and Continuous Positive Airway Pressure Ventilation ,    Pulmonary exam normal breath sounds clear to auscultation       Cardiovascular hypertension, Pt. on medications Normal cardiovascular exam Rhythm:Regular Rate:Normal  EKG: 02/28/18 Rate 87 bpm Normal Sinus rhythm   CV: Echo 03/01/18 Left ventricle: The cavity size was normal. Systolic function was normal. The estimated ejection fraction was in the range of 60% to 65%. Wall motion was normal; there were no regional wall motion abnormalities. Doppler parameters are consistent with abnormal left ventricular relaxation (grade 1 diastolic dysfunction).   Neuro/Psych  Headaches, negative psych ROS   GI/Hepatic negative GI ROS, Neg liver ROS,   Endo/Other  diabetes, Type 2Morbid obesity  Renal/GU negative Renal ROS  negative genitourinary   Musculoskeletal  (+) Arthritis , Osteoarthritis,    Abdominal   Peds negative pediatric ROS (+)  Hematology negative hematology ROS (+) anemia ,   Anesthesia Other Findings   Reproductive/Obstetrics negative OB ROS                           Anesthesia Physical Anesthesia Plan  ASA: III  Anesthesia Plan: Spinal   Post-op Pain Management: GA combined w/ Regional for post-op pain   Induction:   PONV Risk Score and Plan: 3 and Ondansetron and Treatment may vary due to age or medical condition  Airway Management Planned: Nasal Cannula and Simple Face Mask  Additional Equipment:   Intra-op Plan:   Post-operative Plan: Extubation in  OR  Informed Consent: I have reviewed the patients History and Physical, chart, labs and discussed the procedure including the risks, benefits and alternatives for the proposed anesthesia with the patient or authorized representative who has indicated his/her understanding and acceptance.     Dental advisory given  Plan Discussed with: CRNA, Anesthesiologist and Surgeon  Anesthesia Plan Comments: (See PAT note 01/23/2019, Konrad Felix, PA-C)      Anesthesia Quick Evaluation

## 2019-01-26 MED ORDER — BUPIVACAINE LIPOSOME 1.3 % IJ SUSP
20.0000 mL | INTRAMUSCULAR | Status: DC
  Filled 2019-01-26: qty 20

## 2019-01-27 ENCOUNTER — Encounter (HOSPITAL_COMMUNITY): Payer: Self-pay | Admitting: Emergency Medicine

## 2019-01-27 ENCOUNTER — Inpatient Hospital Stay (HOSPITAL_COMMUNITY): Payer: Medicare Other | Admitting: Physician Assistant

## 2019-01-27 ENCOUNTER — Inpatient Hospital Stay (HOSPITAL_COMMUNITY): Payer: Medicare Other | Admitting: Anesthesiology

## 2019-01-27 ENCOUNTER — Encounter (HOSPITAL_COMMUNITY)
Admission: RE | Disposition: A | Discharge status: Home / Self Care | Payer: Self-pay | Source: Home / Self Care | Attending: Orthopedic Surgery

## 2019-01-27 ENCOUNTER — Other Ambulatory Visit: Payer: Self-pay

## 2019-01-27 ENCOUNTER — Observation Stay (HOSPITAL_COMMUNITY)
Admission: RE | Admit: 2019-01-27 | Discharge: 2019-01-28 | Disposition: A | Discharge status: Home / Self Care | Payer: Medicare Other | Attending: Orthopedic Surgery | Admitting: Orthopedic Surgery

## 2019-01-27 DIAGNOSIS — E785 Hyperlipidemia, unspecified: Secondary | ICD-10-CM | POA: Insufficient documentation

## 2019-01-27 DIAGNOSIS — Z6838 Body mass index (BMI) 38.0-38.9, adult: Secondary | ICD-10-CM | POA: Insufficient documentation

## 2019-01-27 DIAGNOSIS — J45909 Unspecified asthma, uncomplicated: Secondary | ICD-10-CM | POA: Diagnosis not present

## 2019-01-27 DIAGNOSIS — E119 Type 2 diabetes mellitus without complications: Secondary | ICD-10-CM | POA: Diagnosis not present

## 2019-01-27 DIAGNOSIS — M179 Osteoarthritis of knee, unspecified: Secondary | ICD-10-CM

## 2019-01-27 DIAGNOSIS — M1711 Unilateral primary osteoarthritis, right knee: Principal | ICD-10-CM | POA: Insufficient documentation

## 2019-01-27 DIAGNOSIS — M1710 Unilateral primary osteoarthritis, unspecified knee: Secondary | ICD-10-CM

## 2019-01-27 DIAGNOSIS — I10 Essential (primary) hypertension: Secondary | ICD-10-CM | POA: Insufficient documentation

## 2019-01-27 DIAGNOSIS — D649 Anemia, unspecified: Secondary | ICD-10-CM | POA: Insufficient documentation

## 2019-01-27 DIAGNOSIS — Z79899 Other long term (current) drug therapy: Secondary | ICD-10-CM | POA: Insufficient documentation

## 2019-01-27 DIAGNOSIS — R2689 Other abnormalities of gait and mobility: Secondary | ICD-10-CM | POA: Insufficient documentation

## 2019-01-27 DIAGNOSIS — Z7901 Long term (current) use of anticoagulants: Secondary | ICD-10-CM | POA: Diagnosis not present

## 2019-01-27 DIAGNOSIS — E669 Obesity, unspecified: Secondary | ICD-10-CM | POA: Insufficient documentation

## 2019-01-27 DIAGNOSIS — Z7951 Long term (current) use of inhaled steroids: Secondary | ICD-10-CM | POA: Diagnosis not present

## 2019-01-27 DIAGNOSIS — Z794 Long term (current) use of insulin: Secondary | ICD-10-CM | POA: Insufficient documentation

## 2019-01-27 DIAGNOSIS — G4733 Obstructive sleep apnea (adult) (pediatric): Secondary | ICD-10-CM | POA: Insufficient documentation

## 2019-01-27 DIAGNOSIS — Z86711 Personal history of pulmonary embolism: Secondary | ICD-10-CM | POA: Insufficient documentation

## 2019-01-27 DIAGNOSIS — Z20828 Contact with and (suspected) exposure to other viral communicable diseases: Secondary | ICD-10-CM | POA: Insufficient documentation

## 2019-01-27 DIAGNOSIS — G8918 Other acute postprocedural pain: Secondary | ICD-10-CM | POA: Diagnosis not present

## 2019-01-27 HISTORY — PX: TOTAL KNEE ARTHROPLASTY: SHX125

## 2019-01-27 LAB — GLUCOSE, CAPILLARY
Glucose-Capillary: 117 mg/dL — ABNORMAL HIGH (ref 70–99)
Glucose-Capillary: 121 mg/dL — ABNORMAL HIGH (ref 70–99)
Glucose-Capillary: 118 mg/dL — ABNORMAL HIGH (ref 70–99)
Glucose-Capillary: 177 mg/dL — ABNORMAL HIGH (ref 70–99)
Glucose-Capillary: 163 mg/dL — ABNORMAL HIGH (ref 70–99)

## 2019-01-27 LAB — TYPE AND SCREEN
ABO/RH(D): A POS
Antibody Screen: NEGATIVE

## 2019-01-27 SURGERY — ARTHROPLASTY, KNEE, TOTAL
Anesthesia: Spinal | Site: Knee | Laterality: Right

## 2019-01-27 MED ORDER — OXYCODONE HCL 5 MG PO TABS: 5.0000 mg | ORAL_TABLET | Freq: Once | ORAL | Status: DC | PRN

## 2019-01-27 MED ORDER — MIDAZOLAM HCL 2 MG/2ML IJ SOLN
1.0000 mg | INTRAMUSCULAR | Status: DC
  Administered 2019-01-27: 2 mg via INTRAVENOUS
  Filled 2019-01-27: qty 2

## 2019-01-27 MED ORDER — DEXAMETHASONE SODIUM PHOSPHATE 10 MG/ML IJ SOLN
8.0000 mg | Freq: Once | INTRAMUSCULAR | Status: AC
  Administered 2019-01-27: 8 mg via INTRAVENOUS

## 2019-01-27 MED ORDER — EPHEDRINE SULFATE-NACL 50-0.9 MG/10ML-% IV SOSY
PREFILLED_SYRINGE | INTRAVENOUS | Status: DC | PRN
  Administered 2019-01-27: 15 mg via INTRAVENOUS

## 2019-01-27 MED ORDER — POVIDONE-IODINE 10 % EX SWAB
2.0000 "application " | Freq: Once | CUTANEOUS | Status: AC
  Administered 2019-01-27: 2 via TOPICAL

## 2019-01-27 MED ORDER — CEFAZOLIN SODIUM-DEXTROSE 2-4 GM/100ML-% IV SOLN
2.0000 g | Freq: Four times a day (QID) | INTRAVENOUS | Status: AC
  Administered 2019-01-27 (×2): 2 g via INTRAVENOUS
  Filled 2019-01-27 (×2): qty 100

## 2019-01-27 MED ORDER — CLONIDINE HCL (ANALGESIA) 100 MCG/ML EP SOLN
EPIDURAL | Status: DC | PRN
  Administered 2019-01-27: 100 ug

## 2019-01-27 MED ORDER — ALBUTEROL SULFATE HFA 108 (90 BASE) MCG/ACT IN AERS: 1.0000 | INHALATION_SPRAY | Freq: Four times a day (QID) | RESPIRATORY_TRACT | Status: DC | PRN

## 2019-01-27 MED ORDER — CHLORHEXIDINE GLUCONATE 4 % EX LIQD: 60.0000 mL | Freq: Once | CUTANEOUS | Status: DC

## 2019-01-27 MED ORDER — IPRATROPIUM-ALBUTEROL 0.5-2.5 (3) MG/3ML IN SOLN: 3.0000 mL | Freq: Four times a day (QID) | RESPIRATORY_TRACT | Status: DC | PRN

## 2019-01-27 MED ORDER — MORPHINE SULFATE (PF) 2 MG/ML IV SOLN: 1.0000 mg | INTRAVENOUS | Status: DC | PRN

## 2019-01-27 MED ORDER — SODIUM CHLORIDE (PF) 0.9 % IJ SOLN
INTRAMUSCULAR | Status: AC
  Filled 2019-01-27: qty 10

## 2019-01-27 MED ORDER — PHENOL 1.4 % MT LIQD: 1.0000 | OROMUCOSAL | Status: DC | PRN

## 2019-01-27 MED ORDER — INSULIN GLARGINE 100 UNIT/ML ~~LOC~~ SOLN
18.0000 [IU] | Freq: Every day | SUBCUTANEOUS | Status: DC | PRN
  Filled 2019-01-27: qty 0.18

## 2019-01-27 MED ORDER — SODIUM CHLORIDE 0.9 % IV SOLN
INTRAVENOUS | Status: DC | PRN
  Administered 2019-01-27: 50 ug/min via INTRAVENOUS

## 2019-01-27 MED ORDER — OXYCODONE HCL 5 MG PO TABS
5.0000 mg | ORAL_TABLET | ORAL | Status: DC | PRN
  Administered 2019-01-27 – 2019-01-28 (×5): 10 mg via ORAL
  Filled 2019-01-27 (×5): qty 2

## 2019-01-27 MED ORDER — LORATADINE 10 MG PO TABS: 10.0000 mg | ORAL_TABLET | Freq: Every day | ORAL | Status: DC | PRN

## 2019-01-27 MED ORDER — PHENYLEPHRINE HCL (PRESSORS) 10 MG/ML IV SOLN
INTRAVENOUS | Status: AC
  Filled 2019-01-27: qty 1

## 2019-01-27 MED ORDER — APIXABAN 2.5 MG PO TABS
2.5000 mg | ORAL_TABLET | Freq: Two times a day (BID) | ORAL | Status: DC
  Administered 2019-01-28: 2.5 mg via ORAL
  Filled 2019-01-27: qty 1

## 2019-01-27 MED ORDER — ACETAMINOPHEN 325 MG PO TABS: 325.0000 mg | ORAL_TABLET | ORAL | Status: DC | PRN

## 2019-01-27 MED ORDER — DEXAMETHASONE SODIUM PHOSPHATE 10 MG/ML IJ SOLN
INTRAMUSCULAR | Status: AC
  Filled 2019-01-27: qty 1

## 2019-01-27 MED ORDER — FENTANYL CITRATE (PF) 100 MCG/2ML IJ SOLN: 25.0000 ug | INTRAMUSCULAR | Status: DC | PRN

## 2019-01-27 MED ORDER — TRAMADOL HCL 50 MG PO TABS
50.0000 mg | ORAL_TABLET | Freq: Four times a day (QID) | ORAL | Status: DC | PRN
  Administered 2019-01-27 (×2): 50 mg via ORAL
  Filled 2019-01-27 (×2): qty 1

## 2019-01-27 MED ORDER — BUPIVACAINE HCL (PF) 0.75 % IJ SOLN
INTRAMUSCULAR | Status: DC | PRN
  Administered 2019-01-27: 25 mL via PERINEURAL

## 2019-01-27 MED ORDER — MEPERIDINE HCL 50 MG/ML IJ SOLN: 6.2500 mg | INTRAMUSCULAR | Status: DC | PRN

## 2019-01-27 MED ORDER — GABAPENTIN 300 MG PO CAPS
300.0000 mg | ORAL_CAPSULE | Freq: Three times a day (TID) | ORAL | Status: DC
  Administered 2019-01-27 – 2019-01-28 (×5): 300 mg via ORAL
  Filled 2019-01-27 (×5): qty 1

## 2019-01-27 MED ORDER — SODIUM CHLORIDE 0.9 % IV SOLN
INTRAVENOUS | Status: DC
  Administered 2019-01-27 – 2019-01-28 (×2): via INTRAVENOUS

## 2019-01-27 MED ORDER — METOCLOPRAMIDE HCL 5 MG PO TABS: 5.0000 mg | ORAL_TABLET | Freq: Three times a day (TID) | ORAL | Status: DC | PRN

## 2019-01-27 MED ORDER — TRANEXAMIC ACID 1000 MG/10ML IV SOLN
2000.0000 mg | Freq: Once | INTRAVENOUS | Status: DC
  Filled 2019-01-27 (×2): qty 20

## 2019-01-27 MED ORDER — METHOCARBAMOL 500 MG PO TABS
500.0000 mg | ORAL_TABLET | Freq: Four times a day (QID) | ORAL | Status: DC | PRN
  Administered 2019-01-27 – 2019-01-28 (×3): 500 mg via ORAL
  Filled 2019-01-27 (×3): qty 1

## 2019-01-27 MED ORDER — BUPIVACAINE LIPOSOME 1.3 % IJ SUSP
INTRAMUSCULAR | Status: DC | PRN
  Administered 2019-01-27: 20 mL

## 2019-01-27 MED ORDER — ACETAMINOPHEN 160 MG/5ML PO SOLN: 325.0000 mg | ORAL | Status: DC | PRN

## 2019-01-27 MED ORDER — METHOCARBAMOL 500 MG IVPB - SIMPLE MED
500.0000 mg | Freq: Four times a day (QID) | INTRAVENOUS | Status: DC | PRN
  Filled 2019-01-27: qty 50

## 2019-01-27 MED ORDER — MENTHOL 3 MG MT LOZG: 1.0000 | LOZENGE | OROMUCOSAL | Status: DC | PRN

## 2019-01-27 MED ORDER — PROPOFOL 10 MG/ML IV BOLUS
INTRAVENOUS | Status: AC
  Filled 2019-01-27: qty 60

## 2019-01-27 MED ORDER — EPHEDRINE 5 MG/ML INJ
INTRAVENOUS | Status: AC
  Filled 2019-01-27: qty 10

## 2019-01-27 MED ORDER — BISACODYL 10 MG RE SUPP: 10.0000 mg | Freq: Every day | RECTAL | Status: DC | PRN

## 2019-01-27 MED ORDER — CEFAZOLIN SODIUM-DEXTROSE 2-4 GM/100ML-% IV SOLN
2.0000 g | INTRAVENOUS | Status: AC
  Administered 2019-01-27: 2 g via INTRAVENOUS
  Filled 2019-01-27: qty 100

## 2019-01-27 MED ORDER — TRANEXAMIC ACID 1000 MG/10ML IV SOLN: 2000.0000 mg | Freq: Once | INTRAVENOUS | Status: DC

## 2019-01-27 MED ORDER — PROPOFOL 10 MG/ML IV BOLUS
INTRAVENOUS | Status: AC
  Filled 2019-01-27: qty 20

## 2019-01-27 MED ORDER — ONDANSETRON HCL 4 MG/2ML IJ SOLN: 4.0000 mg | Freq: Four times a day (QID) | INTRAMUSCULAR | Status: DC | PRN

## 2019-01-27 MED ORDER — PHENYLEPHRINE 40 MCG/ML (10ML) SYRINGE FOR IV PUSH (FOR BLOOD PRESSURE SUPPORT)
PREFILLED_SYRINGE | INTRAVENOUS | Status: DC | PRN
  Administered 2019-01-27 (×2): 80 ug via INTRAVENOUS

## 2019-01-27 MED ORDER — DOCUSATE SODIUM 100 MG PO CAPS
100.0000 mg | ORAL_CAPSULE | Freq: Two times a day (BID) | ORAL | Status: DC
  Administered 2019-01-27 – 2019-01-28 (×2): 100 mg via ORAL
  Filled 2019-01-27 (×2): qty 1

## 2019-01-27 MED ORDER — PHENYLEPHRINE 40 MCG/ML (10ML) SYRINGE FOR IV PUSH (FOR BLOOD PRESSURE SUPPORT)
PREFILLED_SYRINGE | INTRAVENOUS | Status: AC
  Filled 2019-01-27: qty 10

## 2019-01-27 MED ORDER — ACETAMINOPHEN 500 MG PO TABS
1000.0000 mg | ORAL_TABLET | Freq: Four times a day (QID) | ORAL | Status: DC
  Administered 2019-01-27 – 2019-01-28 (×3): 1000 mg via ORAL
  Filled 2019-01-27 (×4): qty 2

## 2019-01-27 MED ORDER — SODIUM CHLORIDE 0.9 % IR SOLN
Status: DC | PRN
  Administered 2019-01-27: 1000 mL

## 2019-01-27 MED ORDER — SODIUM CHLORIDE (PF) 0.9 % IJ SOLN
INTRAMUSCULAR | Status: AC
  Filled 2019-01-27: qty 50

## 2019-01-27 MED ORDER — DEXAMETHASONE SODIUM PHOSPHATE 10 MG/ML IJ SOLN
10.0000 mg | Freq: Once | INTRAMUSCULAR | Status: AC
  Administered 2019-01-28: 10 mg via INTRAVENOUS
  Filled 2019-01-27: qty 1

## 2019-01-27 MED ORDER — ONDANSETRON HCL 4 MG PO TABS
4.0000 mg | ORAL_TABLET | Freq: Four times a day (QID) | ORAL | Status: DC | PRN
  Administered 2019-01-28: 4 mg via ORAL
  Filled 2019-01-27: qty 1

## 2019-01-27 MED ORDER — METOCLOPRAMIDE HCL 5 MG/ML IJ SOLN: 5.0000 mg | Freq: Three times a day (TID) | INTRAMUSCULAR | Status: DC | PRN

## 2019-01-27 MED ORDER — ONDANSETRON HCL 4 MG/2ML IJ SOLN
INTRAMUSCULAR | Status: AC
  Filled 2019-01-27: qty 2

## 2019-01-27 MED ORDER — INSULIN ASPART 100 UNIT/ML ~~LOC~~ SOLN
0.0000 [IU] | Freq: Three times a day (TID) | SUBCUTANEOUS | Status: DC
  Administered 2019-01-27: 3 [IU] via SUBCUTANEOUS

## 2019-01-27 MED ORDER — ACETAMINOPHEN 10 MG/ML IV SOLN
1000.0000 mg | Freq: Four times a day (QID) | INTRAVENOUS | Status: DC
  Administered 2019-01-27: 1000 mg via INTRAVENOUS
  Filled 2019-01-27: qty 100

## 2019-01-27 MED ORDER — TRANEXAMIC ACID 1000 MG/10ML IV SOLN
INTRAVENOUS | Status: DC | PRN
  Administered 2019-01-27: 2000 mg via TOPICAL

## 2019-01-27 MED ORDER — BUPIVACAINE IN DEXTROSE 0.75-8.25 % IT SOLN
INTRATHECAL | Status: DC | PRN
  Administered 2019-01-27: 1.6 mL via INTRATHECAL

## 2019-01-27 MED ORDER — ALBUTEROL SULFATE (2.5 MG/3ML) 0.083% IN NEBU: 2.5000 mg | INHALATION_SOLUTION | Freq: Four times a day (QID) | RESPIRATORY_TRACT | Status: DC | PRN

## 2019-01-27 MED ORDER — HYDROCHLOROTHIAZIDE 12.5 MG PO CAPS
12.5000 mg | ORAL_CAPSULE | Freq: Every evening | ORAL | Status: DC
  Administered 2019-01-27: 12.5 mg via ORAL
  Filled 2019-01-27: qty 1

## 2019-01-27 MED ORDER — SODIUM CHLORIDE (PF) 0.9 % IJ SOLN
INTRAMUSCULAR | Status: DC | PRN
  Administered 2019-01-27: 60 mL

## 2019-01-27 MED ORDER — STERILE WATER FOR IRRIGATION IR SOLN
Status: DC | PRN
  Administered 2019-01-27 (×2): 1000 mL

## 2019-01-27 MED ORDER — PROPOFOL 10 MG/ML IV BOLUS
INTRAVENOUS | Status: DC | PRN
  Administered 2019-01-27: 40 mg via INTRAVENOUS
  Administered 2019-01-27: 30 mg via INTRAVENOUS

## 2019-01-27 MED ORDER — DIPHENHYDRAMINE HCL 12.5 MG/5ML PO ELIX: 12.5000 mg | ORAL_SOLUTION | ORAL | Status: DC | PRN

## 2019-01-27 MED ORDER — FLUTICASONE PROPIONATE 50 MCG/ACT NA SUSP: 2.0000 | Freq: Every day | NASAL | Status: DC | PRN

## 2019-01-27 MED ORDER — LACTATED RINGERS IV SOLN
INTRAVENOUS | Status: DC
  Administered 2019-01-27 (×2): via INTRAVENOUS

## 2019-01-27 MED ORDER — FENTANYL CITRATE (PF) 100 MCG/2ML IJ SOLN
50.0000 ug | INTRAMUSCULAR | Status: DC
  Administered 2019-01-27: 50 ug via INTRAVENOUS
  Filled 2019-01-27: qty 2

## 2019-01-27 MED ORDER — ONDANSETRON HCL 4 MG/2ML IJ SOLN: 4.0000 mg | Freq: Once | INTRAMUSCULAR | Status: DC | PRN

## 2019-01-27 MED ORDER — PROPOFOL 500 MG/50ML IV EMUL
INTRAVENOUS | Status: DC | PRN
  Administered 2019-01-27: 100 ug/kg/min via INTRAVENOUS

## 2019-01-27 MED ORDER — OXYCODONE HCL 5 MG/5ML PO SOLN: 5.0000 mg | Freq: Once | ORAL | Status: DC | PRN

## 2019-01-27 MED ORDER — POLYETHYLENE GLYCOL 3350 17 G PO PACK: 17.0000 g | PACK | Freq: Every day | ORAL | Status: DC | PRN

## 2019-01-27 MED ORDER — FLEET ENEMA 7-19 GM/118ML RE ENEM: 1.0000 | ENEMA | Freq: Once | RECTAL | Status: DC | PRN

## 2019-01-27 SURGICAL SUPPLY — 60 items
ATTUNE PSFEM RTSZ5 NARCEM KNEE (Femur) ×1 IMPLANT
ATTUNE PSRP INSR SZ5 8 KNEE (Insert) ×2 IMPLANT
BAG SPEC THK2 15X12 ZIP CLS (MISCELLANEOUS) ×1
BAG ZIPLOCK 12X15 (MISCELLANEOUS) ×2 IMPLANT
BASEPLATE TIBIAL ROTATING SZ 4 (Knees) ×1 IMPLANT
BLADE SAG 18X100X1.27 (BLADE) ×2 IMPLANT
BLADE SAW SGTL 11.0X1.19X90.0M (BLADE) ×2 IMPLANT
BLADE SURG SZ10 CARB STEEL (BLADE) ×4 IMPLANT
BNDG ELASTIC 4X5.8 VLCR STR LF (GAUZE/BANDAGES/DRESSINGS) ×1 IMPLANT
BNDG ELASTIC 6X5.8 VLCR STR LF (GAUZE/BANDAGES/DRESSINGS) ×2 IMPLANT
BOWL SMART MIX CTS (DISPOSABLE) ×2 IMPLANT
BSPLAT TIB 4 CMNT ROT PLAT STR (Knees) ×1 IMPLANT
CEMENT HV SMART SET (Cement) ×4 IMPLANT
CLSR STERI-STRIP ANTIMIC 1/2X4 (GAUZE/BANDAGES/DRESSINGS) ×1 IMPLANT
COVER SURGICAL LIGHT HANDLE (MISCELLANEOUS) ×2 IMPLANT
COVER WAND RF STERILE (DRAPES) IMPLANT
CUFF TOURN SGL QUICK 34 (TOURNIQUET CUFF) ×2
CUFF TRNQT CYL 34X4.125X (TOURNIQUET CUFF) ×1 IMPLANT
DECANTER SPIKE VIAL GLASS SM (MISCELLANEOUS) ×2 IMPLANT
DRAPE U-SHAPE 47X51 STRL (DRAPES) ×2 IMPLANT
DRSG ADAPTIC 3X8 NADH LF (GAUZE/BANDAGES/DRESSINGS) ×2 IMPLANT
DRSG PAD ABDOMINAL 8X10 ST (GAUZE/BANDAGES/DRESSINGS) ×2 IMPLANT
DURAPREP 26ML APPLICATOR (WOUND CARE) ×2 IMPLANT
ELECT REM PT RETURN 15FT ADLT (MISCELLANEOUS) ×2 IMPLANT
EVACUATOR 1/8 PVC DRAIN (DRAIN) ×2 IMPLANT
GAUZE SPONGE 4X4 12PLY STRL (GAUZE/BANDAGES/DRESSINGS) ×2 IMPLANT
GLOVE BIO SURGEON STRL SZ7 (GLOVE) ×2 IMPLANT
GLOVE BIO SURGEON STRL SZ8 (GLOVE) ×2 IMPLANT
GLOVE BIOGEL PI IND STRL 6.5 (GLOVE) ×1 IMPLANT
GLOVE BIOGEL PI IND STRL 7.0 (GLOVE) ×1 IMPLANT
GLOVE BIOGEL PI IND STRL 8 (GLOVE) ×1 IMPLANT
GLOVE BIOGEL PI INDICATOR 6.5 (GLOVE) ×1
GLOVE BIOGEL PI INDICATOR 7.0 (GLOVE) ×1
GLOVE BIOGEL PI INDICATOR 8 (GLOVE) ×1
GLOVE SURG SS PI 6.5 STRL IVOR (GLOVE) ×2 IMPLANT
GOWN STRL REUS W/TWL LRG LVL3 (GOWN DISPOSABLE) ×6 IMPLANT
HANDPIECE INTERPULSE COAX TIP (DISPOSABLE) ×2
HOLDER FOLEY CATH W/STRAP (MISCELLANEOUS) IMPLANT
IMMOBILIZER KNEE 20 (SOFTGOODS) ×2
IMMOBILIZER KNEE 20 THIGH 36 (SOFTGOODS) ×1 IMPLANT
KIT TURNOVER KIT A (KITS) IMPLANT
MANIFOLD NEPTUNE II (INSTRUMENTS) ×2 IMPLANT
NS IRRIG 1000ML POUR BTL (IV SOLUTION) ×2 IMPLANT
PACK TOTAL KNEE CUSTOM (KITS) ×2 IMPLANT
PADDING CAST COTTON 6X4 STRL (CAST SUPPLIES) ×2 IMPLANT
PATELLA MEDIAL ATTUN 35MM KNEE (Knees) ×1 IMPLANT
PIN STEINMAN FIXATION KNEE (PIN) ×1 IMPLANT
PIN THREADED HEADED SIGMA (PIN) ×1 IMPLANT
PROTECTOR NERVE ULNAR (MISCELLANEOUS) ×2 IMPLANT
SET HNDPC FAN SPRY TIP SCT (DISPOSABLE) ×1 IMPLANT
STRIP CLOSURE SKIN 1/2X4 (GAUZE/BANDAGES/DRESSINGS) ×4 IMPLANT
SUT MNCRL AB 4-0 PS2 18 (SUTURE) ×2 IMPLANT
SUT STRATAFIX 0 PDS 27 VIOLET (SUTURE) ×2
SUT VIC AB 2-0 CT1 27 (SUTURE) ×6
SUT VIC AB 2-0 CT1 TAPERPNT 27 (SUTURE) ×3 IMPLANT
SUTURE STRATFX 0 PDS 27 VIOLET (SUTURE) ×1 IMPLANT
TRAY FOLEY MTR SLVR 16FR STAT (SET/KITS/TRAYS/PACK) ×2 IMPLANT
WATER STERILE IRR 1000ML POUR (IV SOLUTION) ×4 IMPLANT
WRAP KNEE MAXI GEL POST OP (GAUZE/BANDAGES/DRESSINGS) ×2 IMPLANT
YANKAUER SUCT BULB TIP 10FT TU (MISCELLANEOUS) ×2 IMPLANT

## 2019-01-27 NOTE — Interval H&P Note (Signed)
History and Physical Interval Note:  01/27/2019 6:41 AM  Sierra Johnson  has presented today for surgery, with the diagnosis of right knee osteoarthritis.  The various methods of treatment have been discussed with the patient and family. After consideration of risks, benefits and other options for treatment, the patient has consented to  Procedure(s) with comments: TOTAL KNEE ARTHROPLASTY (Right) - 29min as a surgical intervention.  The patient's history has been reviewed, patient examined, no change in status, stable for surgery.  I have reviewed the patient's chart and labs.  Questions were answered to the patient's satisfaction.     Pilar Plate Meiya Wisler

## 2019-01-27 NOTE — Discharge Instructions (Addendum)
° °Dr. Frank Aluisio °Total Joint Specialist °[facility] °[address] °[place], [place] [address] °[phone] ° °TOTAL KNEE REPLACEMENT POSTOPERATIVE DIRECTIONS ° °Knee Rehabilitation, Guidelines Following Surgery  °Results after knee surgery are often greatly improved when you follow the exercise, range of motion and muscle strengthening exercises prescribed by your doctor. Safety measures are also important to protect the knee from further injury. Any time any of these exercises cause you to have increased pain or swelling in your knee joint, decrease the amount until you are comfortable again and slowly increase them. If you have problems or questions, call your caregiver or physical therapist for advice.  ° °HOME CARE INSTRUCTIONS  °• Remove items at home which could result in a fall. This includes throw rugs or furniture in walking pathways.  °· ICE to the affected knee every three hours for 30 minutes at a time and then as needed for pain and swelling.  Continue to use ice on the knee for pain and swelling from surgery. You may notice swelling that will progress down to the foot and ankle.  This is normal after surgery.  Elevate the leg when you are not up walking on it.   °· Continue to use the breathing machine which will help keep your temperature down.  It is common for your temperature to cycle up and down following surgery, especially at night when you are not up moving around and exerting yourself.  The breathing machine keeps your lungs expanded and your temperature down. °· Do not place pillow under knee, focus on keeping the knee straight while resting ° °DIET °You may resume your previous home diet once your are discharged from the hospital. ° °DRESSING / WOUND CARE / SHOWERING °You may shower 3 days after surgery, but keep the wounds dry during showering.  You may use an occlusive plastic wrap (Press'n Seal for example), NO SOAKING/SUBMERGING IN THE BATHTUB.  If the bandage  gets wet, change with a clean dry gauze.  If the incision gets wet, pat the wound dry with a clean towel. °You may start showering once you are discharged home but do not submerge the incision under water. Just pat the incision dry and apply a dry gauze dressing on daily. °Change the surgical dressing daily and reapply a dry dressing each time. ° °ACTIVITY °Walk with your walker as instructed. °Use walker as long as suggested by your caregivers. °Avoid periods of inactivity such as sitting longer than an hour when not asleep. This helps prevent blood clots.  °You may resume a sexual relationship in one month or when given the OK by your doctor.  °You may return to work once you are cleared by your doctor.  °Do not drive a car for 6 weeks or until released by you surgeon.  °Do not drive while taking narcotics. ° °WEIGHT BEARING °Weight bearing as tolerated with assist device (walker, cane, etc) as directed, use it as long as suggested by your surgeon or therapist, typically at least 4-6 weeks. ° °POSTOPERATIVE CONSTIPATION PROTOCOL °Constipation - defined medically as fewer than three stools per week and severe constipation as less than one stool per week. ° °One of the most common issues patients have following surgery is constipation.  Even if you have a regular bowel pattern at home, your normal regimen is likely to be disrupted due to multiple reasons following surgery.  Combination of anesthesia, postoperative narcotics, change in appetite and fluid intake all can affect your bowels.    In order to avoid complications following surgery, here are some recommendations in order to help you during your recovery period. ° °Colace (docusate) - Pick up an over-the-counter form of Colace or another stool softener and take twice a day as long as you are requiring postoperative pain medications.  Take with a full glass of water daily.  If you experience loose stools or diarrhea, hold the colace until you stool forms back  up.  If your symptoms do not get better within 1 week or if they get worse, check with your doctor. ° °Dulcolax (bisacodyl) - Pick up over-the-counter and take as directed by the product packaging as needed to assist with the movement of your bowels.  Take with a full glass of water.  Use this product as needed if not relieved by Colace only.  ° °MiraLax (polyethylene glycol) - Pick up over-the-counter to have on hand.  MiraLax is a solution that will increase the amount of water in your bowels to assist with bowel movements.  Take as directed and can mix with a glass of water, juice, soda, coffee, or tea.  Take if you go more than two days without a movement. °Do not use MiraLax more than once per day. Call your doctor if you are still constipated or irregular after using this medication for 7 days in a row. ° °If you continue to have problems with postoperative constipation, please contact the office for further assistance and recommendations.  If you experience "the worst abdominal pain ever" or develop nausea or vomiting, please contact the office immediatly for further recommendations for treatment. ° °ITCHING ° If you experience itching with your medications, try taking only a single pain pill, or even half a pain pill at a time.  You can also use Benadryl over the counter for itching or also to help with sleep.  ° °TED HOSE STOCKINGS °Wear the elastic stockings on both legs for three weeks following surgery during the day but you may remove then at night for sleeping. ° °MEDICATIONS °See your medication summary on the “After Visit Summary” that the nursing staff will review with you prior to discharge.  You may have some home medications which will be placed on hold until you complete the course of blood thinner medication.  It is important for you to complete the blood thinner medication as prescribed by your surgeon.  Continue your approved medications as instructed at time of discharge. ° °PRECAUTIONS °If  you experience chest pain or shortness of breath - call 911 immediately for transfer to the hospital emergency department.  °If you develop a fever greater that 101 F, purulent drainage from wound, increased redness or drainage from wound, foul odor from the wound/dressing, or calf pain - CONTACT YOUR SURGEON.   °                                                °FOLLOW-UP APPOINTMENTS °Make sure you keep all of your appointments after your operation with your surgeon and caregivers. You should call the office at the above phone number and make an appointment for approximately two weeks after the date of your surgery or on the date instructed by your surgeon outlined in the "After Visit Summary". ° ° °RANGE OF MOTION AND STRENGTHENING EXERCISES  °Rehabilitation of the knee is important following a knee injury or   an operation. After just a few days of immobilization, the muscles of the thigh which control the knee become weakened and shrink (atrophy). Knee exercises are designed to build up the tone and strength of the thigh muscles and to improve knee motion. Often times heat used for twenty to thirty minutes before working out will loosen up your tissues and help with improving the range of motion but do not use heat for the first two weeks following surgery. These exercises can be done on a training (exercise) mat, on the floor, on a table or on a bed. Use what ever works the best and is most comfortable for you Knee exercises include:  °• Leg Lifts - While your knee is still immobilized in a splint or cast, you can do straight leg raises. Lift the leg to 60 degrees, hold for 3 sec, and slowly lower the leg. Repeat 10-20 times 2-3 times daily. Perform this exercise against resistance later as your knee gets better.  °• Quad and Hamstring Sets - Tighten up the muscle on the front of the thigh (Quad) and hold for 5-10 sec. Repeat this 10-20 times hourly. Hamstring sets are done by pushing the foot backward against an  object and holding for 5-10 sec. Repeat as with quad sets.  °· Leg Slides: Lying on your back, slowly slide your foot toward your buttocks, bending your knee up off the floor (only go as far as is comfortable). Then slowly slide your foot back down until your leg is flat on the floor again. °· Angel Wings: Lying on your back spread your legs to the side as far apart as you can without causing discomfort.  °A rehabilitation program following serious knee injuries can speed recovery and prevent re-injury in the future due to weakened muscles. Contact your doctor or a physical therapist for more information on knee rehabilitation.  ° °IF YOU ARE TRANSFERRED TO A SKILLED REHAB FACILITY °If the patient is transferred to a skilled rehab facility following release from the hospital, a list of the current medications will be sent to the facility for the patient to continue.  When discharged from the skilled rehab facility, please have the facility set up the patient's Home Health Physical Therapy prior to being released. Also, the skilled facility will be responsible for providing the patient with their medications at time of release from the facility to include their pain medication, the muscle relaxants, and their blood thinner medication. If the patient is still at the rehab facility at time of the two week follow up appointment, the skilled rehab facility will also need to assist the patient in arranging follow up appointment in our office and any transportation needs. ° °MAKE SURE YOU:  °• Understand these instructions.  °• Get help right away if you are not doing well or get worse.  ° ° °Pick up stool softner and laxative for home use following surgery while on pain medications. °Do not submerge incision under water. °Please use good hand washing techniques while changing dressing each day. °May shower starting three days after surgery. °Please use a clean towel to pat the incision dry following showers. °Continue to  use ice for pain and swelling after surgery. °Do not use any lotions or creams on the incision until instructed by your surgeon. ° °

## 2019-01-27 NOTE — Transfer of Care (Signed)
Immediate Anesthesia Transfer of Care Note  Patient: Sierra Johnson  Procedure(s) Performed: TOTAL KNEE ARTHROPLASTY (Right Knee)  Patient Location: PACU  Anesthesia Type:Spinal  Level of Consciousness: awake, alert  and oriented  Airway & Oxygen Therapy: Patient Spontanous Breathing and Patient connected to face mask oxygen  Post-op Assessment: Report given to RN and Post -op Vital signs reviewed and stable  Post vital signs: Reviewed and stable  Last Vitals:  Vitals Value Taken Time  BP 125/82 01/27/19 1229  Temp 36.4 C 01/27/19 1229  Pulse 63 01/27/19 1229  Resp 18 01/27/19 1229  SpO2 100 % 01/27/19 1229    Last Pain:  Vitals:   01/27/19 1117  TempSrc:   PainSc: 0-No pain      Patients Stated Pain Goal: 4 (02/98/47 3085)  Complications: No apparent anesthesia complications

## 2019-01-27 NOTE — Progress Notes (Signed)
AssistedDr. Oddono with right, ultrasound guided, adductor canal block. Side rails up, monitors on throughout procedure. See vital signs in flow sheet. Tolerated Procedure well.  

## 2019-01-27 NOTE — Anesthesia Procedure Notes (Signed)
Spinal  Patient location during procedure: OR Start time: 01/27/2019 8:19 AM Staffing Anesthesiologist: Janeece Riggers, MD Performed: anesthesiologist  Preanesthetic Checklist Completed: patient identified, site marked, surgical consent, pre-op evaluation, timeout performed, IV checked, risks and benefits discussed and monitors and equipment checked Spinal Block Patient position: sitting Prep: DuraPrep Patient monitoring: heart rate, cardiac monitor, continuous pulse ox and blood pressure Approach: midline Location: L3-4 Injection technique: single-shot Needle Needle type: Whitacre  Needle gauge: 22 G Needle length: 9 cm Assessment Sensory level: T4 Additional Notes Attempt x 1 by CRNA with Pencan unsuccessful, attempt by MDA x 1 with pencan unsuccessful, attempt x 1 with 22g Whit successful.  Patient tolerated well.  Kit and expiration checked and time out performed prior to start of procedure

## 2019-01-27 NOTE — Anesthesia Postprocedure Evaluation (Signed)
Anesthesia Post Note  Patient: LOIS SLAGEL  Procedure(s) Performed: TOTAL KNEE ARTHROPLASTY (Right Knee)     Patient location during evaluation: PACU Anesthesia Type: Spinal Level of consciousness: oriented and awake and alert Pain management: pain level controlled Vital Signs Assessment: post-procedure vital signs reviewed and stable Respiratory status: spontaneous breathing, respiratory function stable and patient connected to nasal cannula oxygen Cardiovascular status: blood pressure returned to baseline and stable Postop Assessment: no headache, no backache and no apparent nausea or vomiting Anesthetic complications: no    Last Vitals:  Vitals:   01/27/19 1229 01/27/19 1325  BP: 125/82 (!) 130/98  Pulse: 63 85  Resp: 18 18  Temp: (!) 36.4 C (!) 36.4 C  SpO2: 100% 100%    Last Pain:  Vitals:   01/27/19 1336  TempSrc:   PainSc: 8                  Zaryiah Barz

## 2019-01-27 NOTE — Evaluation (Signed)
Physical Therapy Evaluation Patient Details Name: Sierra Johnson MRN: [id number] DOB: [birth year] Today's Date: 01/27/2019   History of Present Illness  68 yo female s/p R TKR on 01/27/19. PMH includes PNA, PE 2019, DM, obesity, OSA, HTN.  Clinical Impression  Pt presents with mild R knee pain, decreased R knee ROM, difficulty performing bed mobility, increased time and effort to perform mobility tasks, and decreased activity tolerance due to R knee pain. Pt to benefit from acute PT to address deficits. Pt ambulated 40 ft with RW with min guard assist, verbal cuing for form and safety provided throughout. Pt educated on ankle pumps (20/hour) to perform this afternoon/evening to increase circulation, to pt's tolerance and limited by pain. PT to progress mobility as tolerated, and will continue to follow acutely.        Follow Up Recommendations Supervision for mobility/OOB;Follow surgeon's recommendation for DC plan and follow-up therapies(OPPT)    Equipment Recommendations  Rolling walker with 5" wheels;3in1 (PT)    Recommendations for Other Services       Precautions / Restrictions Precautions Precautions: Fall Required Braces or Orthoses: Knee Immobilizer - Right Knee Immobilizer - Right: On when out of bed or walking;Discontinue once straight leg raise with < 10 degree lag Restrictions Weight Bearing Restrictions: No Other Position/Activity Restrictions: WBAT      Mobility  Bed Mobility Overal bed mobility: Needs Assistance Bed Mobility: Supine to Sit     Supine to sit: HOB elevated;Min assist     General bed mobility comments: Min assist for RLE lifting and translation to EOB, verbal cuing for sequencing. Increased time and effort.  Transfers Overall transfer level: Needs assistance Equipment used: Rolling walker (2 wheeled) Transfers: Sit to/from Stand Sit to Stand: Min guard;From elevated surface         General transfer comment: Min guard for safety, verbal  cuing for hand placement when rising but not followed.  Ambulation/Gait Ambulation/Gait assistance: Min guard Gait Distance (Feet): 40 Feet Assistive device: Rolling walker (2 wheeled) Gait Pattern/deviations: Step-to pattern;Decreased stance time - right;Decreased weight shift to right;Antalgic;Decreased step length - right;Decreased step length - left Gait velocity: decr   General Gait Details: Min guard for safety, verbal cuing for sequencing, placement in RW, upright posture. Pt with increasingly antalgic gait with further ambulation distance.  Stairs            Wheelchair Mobility    Modified Rankin (Stroke Patients Only)       Balance Overall balance assessment: Mild deficits observed, not formally tested                                           Pertinent Vitals/Pain Pain Assessment: 0-10 Pain Score: 2  Pain Location: R knee Pain Descriptors / Indicators: Sore Pain Intervention(s): Limited activity within patient's tolerance;Monitored during session;Premedicated before session;Repositioned;Ice applied    Home Living Family/patient expects to be discharged to:: Private residence Living Arrangements: Spouse/significant other Available Help at Discharge: Family Type of Home: House Home Access: Stairs to enter Entrance Stairs-Rails: None Technical brewer of Steps: 2 Home Layout: One level Home Equipment: None      Prior Function Level of Independence: Independent               Hand Dominance   Dominant Hand: Right    Extremity/Trunk Assessment   Upper Extremity Assessment Upper Extremity Assessment: Overall WFL for  tasks assessed    Lower Extremity Assessment Lower Extremity Assessment: Overall WFL for tasks assessed;RLE deficits/detail RLE Deficits / Details: suspected post-surgical weakness; able to perform ankle pumps, quad set, heel slide, SLR with lift assist RLE Sensation: WNL    Cervical / Trunk  Assessment Cervical / Trunk Assessment: Normal  Communication   Communication: No difficulties  Cognition Arousal/Alertness: Awake/alert Behavior During Therapy: WFL for tasks assessed/performed Overall Cognitive Status: Within Functional Limits for tasks assessed                                        General Comments      Exercises Total Joint Exercises Goniometric ROM: knee aarom ~10-50*, limited by pain   Assessment/Plan    PT Assessment Patient needs continued PT services  PT Problem List Decreased strength;Decreased mobility;Decreased range of motion;Decreased activity tolerance;Decreased balance;Decreased knowledge of use of DME;Pain       PT Treatment Interventions DME instruction;Therapeutic activities;Gait training;Therapeutic exercise;Patient/family education;Balance training;Stair training;Functional mobility training    PT Goals (Current goals can be found in the Care Plan section)  Acute Rehab PT Goals Patient Stated Goal: go home tomorrow PT Goal Formulation: With patient Time For Goal Achievement: 02/03/19 Potential to Achieve Goals: Good    Frequency 7X/week   Barriers to discharge        Co-evaluation               AM-PAC PT "6 Clicks" Mobility  Outcome Measure Help needed turning from your back to your side while in a flat bed without using bedrails?: A Little Help needed moving from lying on your back to sitting on the side of a flat bed without using bedrails?: A Little Help needed moving to and from a bed to a chair (including a wheelchair)?: A Little Help needed standing up from a chair using your arms (e.g., wheelchair or bedside chair)?: A Little Help needed to walk in hospital room?: A Little Help needed climbing 3-5 steps with a railing? : A Lot 6 Click Score: 17    End of Session Equipment Utilized During Treatment: Gait belt;Right knee immobilizer Activity Tolerance: Patient tolerated treatment well;Patient  limited by fatigue Patient left: in chair;with call bell/phone within reach;with chair alarm set;with family/visitor present;with SCD's reapplied Nurse Communication: Mobility status PT Visit Diagnosis: Other abnormalities of gait and mobility (R26.89);Difficulty in walking, not elsewhere classified (R26.2)    Time: 4076-8088 PT Time Calculation (min) (ACUTE ONLY): 28 min   Charges:   PT Evaluation $PT Eval Low Complexity: 1 Low PT Treatments $Gait Training: 8-22 mins      Julien Girt, PT Acute Rehabilitation Services Pager [phone]  Office [phone]  Larisa Lanius D Elonda Husky 01/27/2019, 3:53 PM

## 2019-01-27 NOTE — Op Note (Signed)
OPERATIVE REPORT-TOTAL KNEE ARTHROPLASTY   Pre-operative diagnosis- Osteoarthritis  Right knee(s)  Post-operative diagnosis- Osteoarthritis Right knee(s)  Procedure-  Right  Total Knee Arthroplasty  Surgeon- Dione Plover. Rickey Sadowski, MD  Assistant- Theresa Duty, PA-C   Anesthesia-  Adductor canal block and spinal  EBL- 25 ml   Drains Hemovac  Tourniquet time-  Total Tourniquet Time Documented: Thigh (Right) - 31 minutes Total: Thigh (Right) - 31 minutes     Complications- None  Condition-PACU - hemodynamically stable.   Brief Clinical Note  Sierra Johnson is a 68 y.o. year old female with end stage OA of her right knee with progressively worsening pain and dysfunction. She has constant pain, with activity and at rest and significant functional deficits with difficulties even with ADLs. She has had extensive non-op management including analgesics, injections of cortisone and viscosupplements, and home exercise program, but remains in significant pain with significant dysfunction.Radiographs show bone on bone arthritis bone on bone medial and patellofemoral. She presents now for right Total Knee Arthroplasty.    Procedure in detail---   The patient is brought into the operating room and positioned supine on the operating table. After successful administration of  Adductor canal block and spinal,   a tourniquet is placed high on the  Right thigh(s) and the lower extremity is prepped and draped in the usual sterile fashion. Time out is performed by the operating team and then the  Right lower extremity is wrapped in Esmarch, knee flexed and the tourniquet inflated to 300 mmHg.       A midline incision is made with a ten blade through the subcutaneous tissue to the level of the extensor mechanism. A fresh blade is used to make a medial parapatellar arthrotomy. Soft tissue over the proximal medial tibia is subperiosteally elevated to the joint line with a knife and into the  semimembranosus bursa with a Cobb elevator. Soft tissue over the proximal lateral tibia is elevated with attention being paid to avoiding the patellar tendon on the tibial tubercle. The patella is everted, knee flexed 90 degrees and the ACL and PCL are removed. Findings are bone on bone medial and patellofemoral with large global osteophytes.        The drill is used to create a starting hole in the distal femur and the canal is thoroughly irrigated with sterile saline to remove the fatty contents. The 5 degree Right  valgus alignment guide is placed into the femoral canal and the distal femoral cutting block is pinned to remove 9 mm off the distal femur. Resection is made with an oscillating saw.      The tibia is subluxed forward and the menisci are removed. The extramedullary alignment guide is placed referencing proximally at the medial aspect of the tibial tubercle and distally along the second metatarsal axis and tibial crest. The block is pinned to remove 22mm off the more deficient medial  side. Resection is made with an oscillating saw. Size 4is the most appropriate size for the tibia and the proximal tibia is prepared with the modular drill and keel punch for that size.      The femoral sizing guide is placed and size 5 is most appropriate. Rotation is marked off the epicondylar axis and confirmed by creating a rectangular flexion gap at 90 degrees. The size 5 cutting block is pinned in this rotation and the anterior, posterior and chamfer cuts are made with the oscillating saw. The intercondylar block is then placed and that cut  is made.      Trial size 4 tibial component, trial size 5 narrow posterior stabilized femur and a 8  mm posterior stabilized rotating platform insert trial is placed. Full extension is achieved with excellent varus/valgus and anterior/posterior balance throughout full range of motion. The patella is everted and thickness measured to be 22  mm. Free hand resection is taken to 12  mm, a 35 template is placed, lug holes are drilled, trial patella is placed, and it tracks normally. Osteophytes are removed off the posterior femur with the trial in place. All trials are removed and the cut bone surfaces prepared with pulsatile lavage. Cement is mixed and once ready for implantation, the size 4 tibial implant, size  5 narrow posterior stabilized femoral component, and the size 35 patella are cemented in place and the patella is held with the clamp. The trial insert is placed and the knee held in full extension. The Exparel (20 ml mixed with 60 ml saline) is injected into the extensor mechanism, posterior capsule, medial and lateral gutters and subcutaneous tissues.  All extruded cement is removed and once the cement is hard the permanent 8 mm posterior stabilized rotating platform insert is placed into the tibial tray.      The wound is copiously irrigated with saline solution and the extensor mechanism closed over a hemovac drain with #1 V-loc suture. The tourniquet is released for a total tourniquet time of 31  minutes. Flexion against gravity is 140 degrees and the patella tracks normally. Subcutaneous tissue is closed with 2.0 vicryl and subcuticular with running 4.0 Monocryl. The incision is cleaned and dried and steri-strips and a bulky sterile dressing are applied. The limb is placed into a knee immobilizer and the patient is awakened and transported to recovery in stable condition.      Please note that a surgical assistant was a medical necessity for this procedure in order to perform it in a safe and expeditious manner. Surgical assistant was necessary to retract the ligaments and vital neurovascular structures to prevent injury to them and also necessary for proper positioning of the limb to allow for anatomic placement of the prosthesis.   Dione Plover Adriel Kessen, MD    01/27/2019, 9:34 AM

## 2019-01-27 NOTE — Anesthesia Procedure Notes (Signed)
Anesthesia Regional Block: Adductor canal block   Pre-Anesthetic Checklist: ,, timeout performed, Correct Patient, Correct Site, Correct Laterality, Correct Procedure, Correct Position, site marked, Risks and benefits discussed,  Surgical consent,  Pre-op evaluation,  At surgeon's request and post-op pain management  Laterality: Right  Prep: chloraprep       Needles:  Injection technique: Single-shot  Needle Type: Echogenic Stimulator Needle     Needle Length: 5cm  Needle Gauge: 22     Additional Needles:   Procedures:, nerve stimulator,,, ultrasound used (permanent image in chart),,,,  Narrative:  Start time: 01/27/2019 8:01 AM End time: 01/27/2019 8:35 AM Injection made incrementally with aspirations every 5 mL.  Performed by: Personally  Anesthesiologist: Janeece Riggers, MD  Additional Notes: Functioning IV was confirmed and monitors were applied.  A 40mm 22ga Arrow echogenic stimulator needle was used. Sterile prep and drape,hand hygiene and sterile gloves were used. Ultrasound guidance: relevant anatomy identified, needle position confirmed, local anesthetic spread visualized around nerve(s)., vascular puncture avoided.  Image printed for medical record. Negative aspiration and negative test dose prior to incremental administration of local anesthetic. The patient tolerated the procedure well.

## 2019-01-28 ENCOUNTER — Encounter (HOSPITAL_COMMUNITY): Payer: Self-pay | Admitting: Orthopedic Surgery

## 2019-01-28 DIAGNOSIS — I10 Essential (primary) hypertension: Secondary | ICD-10-CM | POA: Diagnosis not present

## 2019-01-28 DIAGNOSIS — Z20828 Contact with and (suspected) exposure to other viral communicable diseases: Secondary | ICD-10-CM | POA: Diagnosis not present

## 2019-01-28 DIAGNOSIS — E119 Type 2 diabetes mellitus without complications: Secondary | ICD-10-CM | POA: Diagnosis not present

## 2019-01-28 DIAGNOSIS — G4733 Obstructive sleep apnea (adult) (pediatric): Secondary | ICD-10-CM | POA: Diagnosis not present

## 2019-01-28 DIAGNOSIS — E669 Obesity, unspecified: Secondary | ICD-10-CM | POA: Diagnosis not present

## 2019-01-28 DIAGNOSIS — M1711 Unilateral primary osteoarthritis, right knee: Secondary | ICD-10-CM | POA: Diagnosis not present

## 2019-01-28 LAB — CBC
HCT: 33.9 % — ABNORMAL LOW (ref 36.0–46.0)
Hemoglobin: 10.6 g/dL — ABNORMAL LOW (ref 12.0–15.0)
MCH: 28 pg (ref 26.0–34.0)
MCHC: 31.3 g/dL (ref 30.0–36.0)
MCV: 89.7 fL (ref 80.0–100.0)
Platelets: 280 10*3/uL (ref 150–400)
RBC: 3.78 MIL/uL — ABNORMAL LOW (ref 3.87–5.11)
RDW: 15.3 % (ref 11.5–15.5)
WBC: 16.1 10*3/uL — ABNORMAL HIGH (ref 4.0–10.5)
nRBC: 0 % (ref 0.0–0.2)

## 2019-01-28 LAB — BASIC METABOLIC PANEL
Anion gap: 10 (ref 5–15)
BUN: 12 mg/dL (ref 8–23)
CO2: 25 mmol/L (ref 22–32)
Calcium: 9 mg/dL (ref 8.9–10.3)
Chloride: 98 mmol/L (ref 98–111)
Creatinine, Ser: 0.94 mg/dL (ref 0.44–1.00)
GFR calc Af Amer: >60 mL/min (ref >60)
GFR calc non Af Amer: >60 mL/min (ref >60)
Glucose, Bld: 144 mg/dL — ABNORMAL HIGH (ref 70–99)
Potassium: 5 mmol/L (ref 3.5–5.1)
Sodium: 133 mmol/L — ABNORMAL LOW (ref 135–145)

## 2019-01-28 LAB — GLUCOSE, CAPILLARY
Glucose-Capillary: 87 mg/dL (ref 70–99)
Glucose-Capillary: 108 mg/dL — ABNORMAL HIGH (ref 70–99)

## 2019-01-28 MED ORDER — OXYCODONE HCL 5 MG PO TABS: 5.0000 mg | ORAL_TABLET | Freq: Four times a day (QID) | ORAL | 0 refills | Status: DC | PRN

## 2019-01-28 MED ORDER — METHOCARBAMOL 500 MG PO TABS: 500.0000 mg | ORAL_TABLET | Freq: Four times a day (QID) | ORAL | 0 refills | Status: DC | PRN

## 2019-01-28 MED ORDER — GABAPENTIN 300 MG PO CAPS: 300.0000 mg | ORAL_CAPSULE | Freq: Three times a day (TID) | ORAL | 0 refills | Status: DC

## 2019-01-28 MED ORDER — TRAMADOL HCL 50 MG PO TABS: 50.0000 mg | ORAL_TABLET | Freq: Four times a day (QID) | ORAL | 0 refills | Status: DC | PRN

## 2019-01-28 NOTE — Progress Notes (Signed)
Physical Therapy Treatment Patient Details Name: Sierra Johnson MRN: [id number] DOB: 24-Jun-1950 Today's Date: 01/28/2019    History of Present Illness 68 yo female s/p R TKR on 01/27/19. PMH includes PNA, PE 2019, DM, obesity, OSA, HTN.    PT Comments    Progressing well with mobility. Reviewed/practiced gait training and stair training. Issued HEP for pt to perform 3x/day until she begins OP PT. All education completed. Okay to d/c from PT standpoint.    Follow Up Recommendations  Follow surgeon's recommendation for DC plan and follow-up therapies;Supervision for mobility/OOB     Equipment Recommendations  Rolling walker with 5" wheels    Recommendations for Other Services       Precautions / Restrictions Precautions Precautions: Fall Required Braces or Orthoses: Knee Immobilizer - Right(able to SLR 8/11) Restrictions Weight Bearing Restrictions: No Other Position/Activity Restrictions: WBAT    Mobility  Bed Mobility Overal bed mobility: Needs Assistance Bed Mobility: Supine to Sit     Supine to sit: Min guard     General bed mobility comments: oob in recliner  Transfers Overall transfer level: Needs assistance Equipment used: Rolling walker (2 wheeled) Transfers: Sit to/from Stand Sit to Stand: Min guard         General transfer comment: Close guard for safety. VCs safety, hand placement  Ambulation/Gait Ambulation/Gait assistance: Min guard Gait Distance (Feet): 100 Feet Assistive device: Rolling walker (2 wheeled) Gait Pattern/deviations: Step-through pattern;Step-to pattern;Decreased stride length     General Gait Details: close guard for safety.   Stairs Stairs: Yes Stairs assistance: Min assist Stair Management: Step to pattern;Forwards;With walker Number of Stairs: 2 General stair comments: up and over portable steps. Husband present to observe and practice with pt.   Wheelchair Mobility    Modified Rankin (Stroke Patients Only)        Balance Overall balance assessment: Needs assistance         Standing balance support: Bilateral upper extremity supported Standing balance-Leahy Scale: Poor                              Cognition Arousal/Alertness: Awake/alert Behavior During Therapy: WFL for tasks assessed/performed Overall Cognitive Status: Within Functional Limits for tasks assessed                                        Exercises     General Comments        Pertinent Vitals/Pain Pain Assessment: 0-10 Pain Score: 5  Pain Location: R knee Pain Descriptors / Indicators: Aching;Sore Pain Intervention(s): Limited activity within patient's tolerance;Monitored during session;Ice applied    Home Living                      Prior Function            PT Goals (current goals can now be found in the care plan section) Progress towards PT goals: Progressing toward goals    Frequency    7X/week      PT Plan Current plan remains appropriate    Co-evaluation              AM-PAC PT "6 Clicks" Mobility   Outcome Measure  Help needed turning from your back to your side while in a flat bed without using bedrails?: A Little Help needed moving from lying on your  back to sitting on the side of a flat bed without using bedrails?: A Little Help needed moving to and from a bed to a chair (including a wheelchair)?: A Little Help needed standing up from a chair using your arms (e.g., wheelchair or bedside chair)?: A Little Help needed to walk in hospital room?: A Little Help needed climbing 3-5 steps with a railing? : A Little 6 Click Score: 18    End of Session Equipment Utilized During Treatment: Gait belt Activity Tolerance: Patient tolerated treatment well Patient left: in bed;with call bell/phone within reach;with family/visitor present   PT Visit Diagnosis: Other abnormalities of gait and mobility (R26.89);Pain Pain - Right/Left: Right Pain - part  of body: Knee     Time: 7416-3845 PT Time Calculation (min) (ACUTE ONLY): 18 min  Charges:  $Gait Training: 8-22 mins $Therapeutic Exercise: 8-22 mins                        Weston Anna, PT Acute Rehabilitation Services Pager: [phone] Office: [phone]

## 2019-01-28 NOTE — TOC Transition Note (Addendum)
Transition of Care [facility]) - CM/SW Discharge Note   Patient Details  Name: Sierra Johnson MRN: [id number] Date of Birth: 05-09-1951  Transition of Care [facility]) CM/SW Contact:  Lia Hopping, [place] Phone Number: 01/28/2019, 9:29 AM  3 in 1 and RW ordered and delivered to the patient room   DME Agency: [employer] Date DME Agency Contacted: 01/28/19 Time DME Agency Contacted: 0900 Representative spoke with at DME Agency: [place] Determinants of Health ([place]) Interventions     Readmission Risk Interventions No flowsheet data found.

## 2019-01-28 NOTE — Progress Notes (Signed)
   Subjective: 1 Day Post-Op Procedure(s) (LRB): TOTAL KNEE ARTHROPLASTY (Right) Patient reports pain as mild.   Patient seen in rounds by Dr. Wynelle Link. Patient is well, and has had no acute complaints or problems. States she is ready to go home. Denies chest pain, SOB, or calf pain. Foley catheter removed this AM. No issues overnight.  We will continue therapy today.   Objective: Vital signs in last 24 hours: Temp:  [97.5 F (36.4 C)-97.6 F (36.4 C)] 97.6 F (36.4 C) (08/11 0519) Pulse Rate:  [56-86] 61 (08/11 0519) Resp:  [13-20] 18 (08/11 0519) BP: (95-155)/(53-98) 147/88 (08/11 0519) SpO2:  [94 %-100 %] 100 % (08/11 0519)  Intake/Output from previous day:  Intake/Output Summary (Last 24 hours) at 01/28/2019 0723 Last data filed at 01/28/2019 7253 Gross per 24 hour  Intake 3266.15 ml  Output 2720 ml  Net 546.15 ml    Labs: Recent Labs    01/28/19 0244  HGB 10.6*   Recent Labs    01/28/19 0244  WBC 16.1*  RBC 3.78*  HCT 33.9*  PLT 280   Recent Labs    01/28/19 0244  NA 133*  K 5.0  CL 98  CO2 25  BUN 12  CREATININE 0.94  GLUCOSE 144*  CALCIUM 9.0   Exam: General - Patient is Alert and Oriented Extremity - Neurologically intact Neurovascular intact Sensation intact distally Dorsiflexion/Plantar flexion intact Dressing - dressing C/D/I Motor Function - intact, moving foot and toes well on exam.   Past Medical History:  Diagnosis Date  . ANEMIA-NOS 04/24/2007  . Arthritis   . DIABETES MELLITUS, TYPE II 04/24/2007  . Elevated glucose 2011  . HYPERLIPIDEMIA 07/17/2007   mild  . HYPERTENSION 04/24/2007  . Menopause 1998   Dr. Gaetano Net  . OBSTRUCTIVE SLEEP APNEA 09/30/2009   on CPAP 2011  . Pneumonia    chronic Brochitis and use inhaler  . VITAMIN B12 DEFICIENCY 04/24/2007    Assessment/Plan: 1 Day Post-Op Procedure(s) (LRB): TOTAL KNEE ARTHROPLASTY (Right) Principal Problem:   OA (osteoarthritis) of knee  Estimated body mass index is 38.67  kg/m as calculated from the following:   Height as of this encounter: 5' (1.524 m).   Weight as of this encounter: 89.8 kg. Advance diet Up with therapy D/C IV fluids  Anticipated LOS equal to or greater than 2 midnights due to - Age 66 and older with one or more of the following:  - Obesity  - Expected need for hospital services (PT, OT, Nursing) required for safe  discharge  - Anticipated need for postoperative skilled nursing care or inpatient rehab  - Active co-morbidities: Diabetes and DVT/VTE OR   - Unanticipated findings during/Post Surgery: None  - Patient is a high risk of re-admission due to: None    DVT Prophylaxis - Eliquis Weight bearing as tolerated. D/C O2 and pulse ox and try on room air. Hemovac pulled without difficulty, will continue therapy today.  Plan is to go Home after hospital stay. Plan for discharge later today if progresses with therapy and meeting her goals. Scheduled for outpatient physical therapy at [facility]. Follow-up in the office in 2 weeks.   Theresa Duty, PA-C Orthopedic Surgery 01/28/2019, 7:23 AM

## 2019-01-28 NOTE — Care Management Obs Status (Signed)
[place] NOTIFICATION   Patient Details  Name: Sierra Johnson MRN: [id number] Date of Birth: September 05, 1950   Medicare Observation Status Notification Given:  Yes    Lia Hopping, LCSW 01/28/2019, 9:27 AM

## 2019-01-28 NOTE — Care Management CC44 (Signed)
Condition Code 44 Documentation Completed  Patient Details  Name: REHANA UNCAPHER MRN: [id number] Date of Birth: [birth year]   Condition Code 44 given:  Yes Patient signature on Condition Code 44 notice:  Yes Documentation of 2 MD's agreement:  Yes Code 44 added to claim:  Yes    Lia Hopping, LCSW 01/28/2019, 9:27 AM

## 2019-01-28 NOTE — Progress Notes (Signed)
Physical Therapy Treatment Patient Details Name: Sierra Johnson MRN: [id number] DOB: 01-Jul-1950 Today's Date: 01/28/2019    History of Present Illness 68 yo female s/p R TKR on 01/27/19. PMH includes PNA, PE 2019, DM, obesity, OSA, HTN.    PT Comments    Progressing with mobility. Will plan to have a 2nd session to practice/provide education with family.    Follow Up Recommendations  Follow surgeon's recommendation for DC plan and follow-up therapies;Supervision for mobility/OOB     Equipment Recommendations  Rolling walker with 5" wheels;3in1 (PT)    Recommendations for Other Services       Precautions / Restrictions Precautions Precautions: Fall Required Braces or Orthoses: Knee Immobilizer - Right(able to SLR 8/11) Restrictions Weight Bearing Restrictions: No Other Position/Activity Restrictions: WBAT    Mobility  Bed Mobility Overal bed mobility: Needs Assistance Bed Mobility: Supine to Sit     Supine to sit: Min guard     General bed mobility comments: close guard for safety. VCs safety, hand placement  Transfers Overall transfer level: Needs assistance Equipment used: Standard walker Transfers: Sit to/from Stand Sit to Stand: Min guard            Ambulation/Gait Ambulation/Gait assistance: Min guard Gait Distance (Feet): 75 Feet Assistive device: Rolling walker (2 wheeled) Gait Pattern/deviations: Step-to pattern;Antalgic     General Gait Details: close guard for safety. pt denied lightheadedness.   Stairs Stairs: Yes Stairs assistance: Min assist Stair Management: Step to pattern;Forwards;With walker Number of Stairs: 2 General stair comments: up and over portable steps. Vcs for safety, technique, sequence.   Wheelchair Mobility    Modified Rankin (Stroke Patients Only)       Balance Overall balance assessment: Needs assistance         Standing balance support: Bilateral upper extremity supported Standing balance-Leahy Scale:  Poor                              Cognition Arousal/Alertness: Awake/alert Behavior During Therapy: WFL for tasks assessed/performed Overall Cognitive Status: Within Functional Limits for tasks assessed                                        Exercises Total Joint Exercises Ankle Circles/Pumps: AROM;Both;10 reps;Supine Quad Sets: AROM;Both;10 reps;Supine Heel Slides: AAROM;Right;10 reps;Supine Hip ABduction/ADduction: AAROM;Right;10 reps;Supine Straight Leg Raises: AROM;AAROM;Right;10 reps;Supine Goniometric ROM: ~10-60 degrees-limited by pain    General Comments        Pertinent Vitals/Pain Pain Assessment: 0-10 Pain Score: 6  Pain Location: R knee with exercises Pain Descriptors / Indicators: Aching;Sore Pain Intervention(s): Limited activity within patient's tolerance;Monitored during session;Ice applied    Home Living                      Prior Function            PT Goals (current goals can now be found in the care plan section) Progress towards PT goals: Progressing toward goals    Frequency    7X/week      PT Plan Current plan remains appropriate    Co-evaluation              AM-PAC PT "6 Clicks" Mobility   Outcome Measure  Help needed turning from your back to your side while in a flat bed without using bedrails?: A Little  Help needed moving from lying on your back to sitting on the side of a flat bed without using bedrails?: A Little Help needed moving to and from a bed to a chair (including a wheelchair)?: A Little Help needed standing up from a chair using your arms (e.g., wheelchair or bedside chair)?: A Little Help needed to walk in hospital room?: A Little Help needed climbing 3-5 steps with a railing? : A Lot 6 Click Score: 17    End of Session Equipment Utilized During Treatment: Gait belt Activity Tolerance: Patient tolerated treatment well Patient left: in chair;with call bell/phone within  reach   PT Visit Diagnosis: Other abnormalities of gait and mobility (R26.89);Pain Pain - Right/Left: Right Pain - part of body: Knee     Time: 5449-2010 PT Time Calculation (min) (ACUTE ONLY): 27 min  Charges:  $Gait Training: 8-22 mins $Therapeutic Exercise: 8-22 mins                       Weston Anna, PT Acute Rehabilitation Services Pager: [phone] Office: [phone]

## 2019-01-29 NOTE — Discharge Summary (Signed)
Physician Discharge Summary   Patient ID: Sierra Johnson MRN: [id number] DOB/AGE: [birth year] 68 y.o.  Admit date: 01/27/2019 Discharge date: 01/28/2019  Primary Diagnosis: Osteoarthritis, right knee   Admission Diagnoses:  Past Medical History:  Diagnosis Date   ANEMIA-NOS 04/24/2007   Arthritis    DIABETES MELLITUS, TYPE II 04/24/2007   Elevated glucose 2011   HYPERLIPIDEMIA 07/17/2007   mild   HYPERTENSION 04/24/2007   Menopause 1998   Dr. Gaetano Net   OBSTRUCTIVE SLEEP APNEA 09/30/2009   on CPAP 2011   Pneumonia    chronic Brochitis and use inhaler   VITAMIN B12 DEFICIENCY 04/24/2007   Discharge Diagnoses:   Principal Problem:   OA (osteoarthritis) of knee  Estimated body mass index is 38.67 kg/m as calculated from the following:   Height as of this encounter: 5' (1.524 m).   Weight as of this encounter: 89.8 kg.  Procedure:  Procedure(s) (LRB): TOTAL KNEE ARTHROPLASTY (Right)   Consults: None  HPI: Sierra Johnson is a 68 y.o.  year old female with end stage OA of her right knee with progressively worsening pain and dysfunction. She has constant pain, with activity and at rest and significant functional deficits with difficulties even with ADLs. She has had extensive non-op management including analgesics, injections of cortisone and viscosupplements, and home exercise program, but remains in significant pain with significant dysfunction.Radiographs show bone on bone arthritis bone on bone medial and patellofemoral. She presents now for right Total Knee Arthroplasty.    Laboratory Data: Admission on 01/27/2019, Discharged on 01/28/2019  Component Date Value Ref Range Status   Glucose-Capillary 01/27/2019 117* 70 - 99 mg/dL Final   Comment 1 01/27/2019 Notify RN   Final   Comment 2 01/27/2019 Document in Chart   Final   Glucose-Capillary 01/27/2019 121* 70 - 99 mg/dL Final   Comment 1 01/27/2019 Document in Chart   Final   Glucose-Capillary 01/27/2019 118*  70 - 99 mg/dL Final   Glucose-Capillary 01/27/2019 177* 70 - 99 mg/dL Final   WBC 01/28/2019 16.1* 4.0 - 10.5 K/uL Final   RBC 01/28/2019 3.78* 3.87 - 5.11 MIL/uL Final   Hemoglobin 01/28/2019 10.6* 12.0 - 15.0 g/dL Final   HCT 01/28/2019 33.9* 36.0 - 46.0 % Final   MCV 01/28/2019 89.7  80.0 - 100.0 fL Final   MCH 01/28/2019 28.0  26.0 - 34.0 pg Final   MCHC 01/28/2019 31.3  30.0 - 36.0 g/dL Final   RDW 01/28/2019 15.3  11.5 - 15.5 % Final   Platelets 01/28/2019 280  150 - 400 K/uL Final   nRBC 01/28/2019 0.0  0.0 - 0.2 % Final   Performed at [facility], [place] [address], [place], [place] [address]   Sodium 01/28/2019 133* 135 - 145 mmol/L Final   Potassium 01/28/2019 5.0  3.5 - 5.1 mmol/L Final   Chloride 01/28/2019 98  98 - 111 mmol/L Final   CO2 01/28/2019 25  22 - 32 mmol/L Final   Glucose, Bld 01/28/2019 144* 70 - 99 mg/dL Final   BUN 01/28/2019 12  8 - 23 mg/dL Final   Creatinine, Ser 01/28/2019 0.94  0.44 - 1.00 mg/dL Final   Calcium 01/28/2019 9.0  8.9 - 10.3 mg/dL Final   GFR calc non Af Amer 01/28/2019 >60  >60 mL/min Final   GFR calc Af Amer 01/28/2019 >60  >60 mL/min Final   Anion gap 01/28/2019 10  5 - 15 Final   Performed at [facility], [place]  [address], [place], [place] [address]   Glucose-Capillary 01/27/2019 163* 70 - 99 mg/dL Final   Glucose-Capillary 01/28/2019 87  70 - 99 mg/dL Final   Glucose-Capillary 01/28/2019 108* 70 - 99 mg/dL Final  Hospital Outpatient Visit on 01/23/2019  Component Date Value Ref Range Status   SARS Coronavirus 2 01/23/2019 NEGATIVE  NEGATIVE Final   Comment: (NOTE) SARS-CoV-2 target nucleic acids are NOT DETECTED. The SARS-CoV-2 RNA is generally detectable in upper and lower respiratory specimens during the acute phase of infection. Negative results do not preclude SARS-CoV-2 infection, do not rule out co-infections with other pathogens, and should not be used as the sole  basis for treatment or other patient management decisions. Negative results must be combined with clinical observations, patient history, and epidemiological information. The expected result is Negative. Fact Sheet for Patients: SugarRoll.be Fact Sheet for Healthcare Providers: https://www.woods-mathews.com/ This test is not yet approved or cleared by the [place] FDA and  has been authorized for detection and/or diagnosis of SARS-CoV-2 by FDA under an Emergency Use Authorization (EUA). This EUA will remain  in effect (meaning this test can be used) for the duration of the COVID-19 declaration under Section 56                          4(b)(1) of the Act, 21 U.S.C. section 360bbb-3(b)(1), unless the authorization is terminated or revoked sooner. Performed at [facility], [place] [address] [place], [place] [address]   Hospital Outpatient Visit on 01/23/2019  Component Date Value Ref Range Status   aPTT 01/23/2019 33  24 - 36 seconds Final   Performed at [facility], [place] [address] [place], [place] [address]   WBC 01/23/2019 10.9* 4.0 - 10.5 K/uL Final   RBC 01/23/2019 4.92  3.87 - 5.11 MIL/uL Final   Hemoglobin 01/23/2019 13.5  12.0 - 15.0 g/dL Final   HCT 01/23/2019 43.3  36.0 - 46.0 % Final   MCV 01/23/2019 88.0  80.0 - 100.0 fL Final   MCH 01/23/2019 27.4  26.0 - 34.0 pg Final   MCHC 01/23/2019 31.2  30.0 - 36.0 g/dL Final   RDW 01/23/2019 15.5  11.5 - 15.5 % Final   Platelets 01/23/2019 420* 150 - 400 K/uL Final   nRBC 01/23/2019 0.0  0.0 - 0.2 % Final   Performed at [facility], [place] [address], [place], [place] [address]   Sodium 01/23/2019 139  135 - 145 mmol/L Final   Potassium 01/23/2019 3.7  3.5 - 5.1 mmol/L Final   Chloride 01/23/2019 101  98 - 111 mmol/L Final   CO2 01/23/2019 27  22 - 32 mmol/L Final   Glucose, Bld 01/23/2019 105* 70 - 99 mg/dL Final   BUN  01/23/2019 11  8 - 23 mg/dL Final   Creatinine, Ser 01/23/2019 0.98  0.44 - 1.00 mg/dL Final   Calcium 01/23/2019 10.0  8.9 - 10.3 mg/dL Final   Total Protein 01/23/2019 8.3* 6.5 - 8.1 g/dL Final   Albumin 01/23/2019 3.8  3.5 - 5.0 g/dL Final   AST 01/23/2019 23  15 - 41 U/L Final   ALT 01/23/2019 19  0 - 44 U/L Final   Alkaline Phosphatase 01/23/2019 81  38 - 126 U/L Final   Total Bilirubin 01/23/2019 0.5  0.3 - 1.2 mg/dL Final   GFR calc non Af Amer 01/23/2019 >60  >60 mL/min Final   GFR calc Af Amer 01/23/2019 >60  >  60 mL/min Final   Anion gap 01/23/2019 11  5 - 15 Final   Performed at [facility], [place] [address], [place], [place] [address]   Prothrombin Time 01/23/2019 12.8  11.4 - 15.2 seconds Final   INR 01/23/2019 1.0  0.8 - 1.2 Final   Comment: (NOTE) INR goal varies based on device and disease states. Performed at [facility], [place] [address], [place], [place] [address]    ABO/RH(D) 01/23/2019 A POS   Final   Antibody Screen 01/23/2019 NEG   Final   Sample Expiration 01/23/2019 01/30/2019,2359   Final   Extend sample reason 01/23/2019    Final                   Value:NO TRANSFUSIONS OR PREGNANCY IN THE PAST 3 MONTHS Performed at [place] [address] [place], [place] [address]    MRSA, PCR 01/23/2019 NEGATIVE  NEGATIVE Final   Staphylococcus aureus 01/23/2019 NEGATIVE  NEGATIVE Final   Comment: (NOTE) The Xpert SA Assay (FDA approved for NASAL specimens in patients 75 years of age and older), is one component of a comprehensive surveillance program. It is not intended to diagnose infection nor to guide or monitor treatment. Performed at [facility], [place] [address] [place], [place] [address]    Glucose-Capillary 01/23/2019 93  70 - 99 mg/dL Final   Hgb A1c MFr Bld 01/23/2019 6.2* 4.8 - 5.6 % Final   Comment: (NOTE) Pre diabetes:          5.7%-6.4% Diabetes:               >6.4% Glycemic control for   <7.0% adults with diabetes    Mean Plasma Glucose 01/23/2019 131.24  mg/dL Final   Performed at [facility], [place] [address] [place],  [address]  Hospital Outpatient Visit on 12/05/2018  Component Date Value Ref Range Status   aPTT 12/05/2018 39* 24 - 36 seconds Final   Comment:        IF BASELINE aPTT IS ELEVATED, SUGGEST PATIENT RISK ASSESSMENT BE USED TO DETERMINE APPROPRIATE ANTICOAGULANT THERAPY. Performed at [facility], [place] [address] [place], [place] [address]    WBC 12/05/2018 11.3* 4.0 - 10.5 K/uL Final   RBC 12/05/2018 4.44  3.87 - 5.11 MIL/uL Final   Hemoglobin 12/05/2018 12.0  12.0 - 15.0 g/dL Final   HCT 12/05/2018 39.4  36.0 - 46.0 % Final   MCV 12/05/2018 88.7  80.0 - 100.0 fL Final   MCH 12/05/2018 27.0  26.0 - 34.0 pg Final   MCHC 12/05/2018 30.5  30.0 - 36.0 g/dL Final   RDW 12/05/2018 15.1  11.5 - 15.5 % Final   Platelets 12/05/2018 343  150 - 400 K/uL Final   nRBC 12/05/2018 0.0  0.0 - 0.2 % Final   Performed at [facility], [place] [address] [place], [place] [address]   Sodium 12/05/2018 140  135 - 145 mmol/L Final   Potassium 12/05/2018 3.9  3.5 - 5.1 mmol/L Final   Chloride 12/05/2018 105  98 - 111 mmol/L Final   CO2 12/05/2018 28  22 - 32 mmol/L Final   Glucose, Bld 12/05/2018 120* 70 - 99 mg/dL Final   BUN 12/05/2018 17  8 - 23 mg/dL Final   Creatinine, Ser 12/05/2018 0.92  0.44 - 1.00 mg/dL Final   Calcium 12/05/2018 9.2  8.9 - 10.3 mg/dL Final   Total  Protein 12/05/2018 8.0  6.5 - 8.1 g/dL Final   Albumin 12/05/2018 3.5  3.5 - 5.0 g/dL Final   AST 12/05/2018 20  15 - 41 U/L Final   ALT 12/05/2018 18  0 - 44 U/L Final   Alkaline Phosphatase 12/05/2018 84  38 - 126 U/L Final   Total Bilirubin 12/05/2018 0.3  0.3 - 1.2 mg/dL Final   GFR calc non Af Amer 12/05/2018 >60  >60 mL/min Final   GFR calc Af Amer 12/05/2018 >60  >60 mL/min  Final   Anion gap 12/05/2018 7  5 - 15 Final   Performed at [facility], [place] [address] [place], [place] [address]   Prothrombin Time 12/05/2018 13.4  11.4 - 15.2 seconds Final   INR 12/05/2018 1.0  0.8 - 1.2 Final   Comment: (NOTE) INR goal varies based on device and disease states. Performed at [facility], [place] [address] [place], [place] [address]    ABO/RH(D) 12/05/2018 A POS   Final   Antibody Screen 12/05/2018 NEG   Final   Sample Expiration 12/05/2018 12/12/2018,2359   Final   Extend sample reason 12/05/2018    Final                   Value:NO TRANSFUSIONS OR PREGNANCY IN THE PAST 3 MONTHS Performed at [place] [address] [place], [place] [address]    Hgb A1c MFr Bld 12/05/2018 6.1* 4.8 - 5.6 % Final   Comment: (NOTE) Pre diabetes:          5.7%-6.4% Diabetes:              >6.4% Glycemic control for   <7.0% adults with diabetes    Mean Plasma Glucose 12/05/2018 128.37  mg/dL Final   Performed at [facility], [place] [address] [place], [place] [address]   MRSA, PCR 12/05/2018 NEGATIVE  NEGATIVE Final   Staphylococcus aureus 12/05/2018 NEGATIVE  NEGATIVE Final   Comment: (NOTE) The Xpert SA Assay (FDA approved for NASAL specimens in patients 77 years of age and older), is one component of a comprehensive surveillance program. It is not intended to diagnose infection nor to guide or monitor treatment. Performed at [facility], [place] [address], [place], [place] [address]    Glucose-Capillary 12/05/2018 136* 70 - 99 mg/dL Final   ABO/RH(D) 12/05/2018    Final                   Value:A POS Performed at [facility], [place] [address], [place], [place] [address]   Hospital Outpatient Visit on 12/05/2018  Component Date Value Ref Range Status   SARS Coronavirus 2 12/05/2018 NEGATIVE  NEGATIVE Final   Comment: (NOTE) SARS-CoV-2 target nucleic acids  are NOT DETECTED. The SARS-CoV-2 RNA is generally detectable in upper and lower respiratory specimens during the acute phase of infection. Negative results do not preclude SARS-CoV-2 infection, do not rule out co-infections with other pathogens, and should not be used as the sole basis for treatment or other patient management decisions. Negative results must be combined with clinical observations, patient history, and epidemiological information. The expected result is Negative. Fact Sheet for Patients: TrashEliminator.se Fact Sheet for Healthcare Providers: WhoisBlogging.ch This test is not yet approved or cleared by the [place] FDA and  has been authorized for detection and/or diagnosis of SARS-CoV-2 by FDA under an Emergency Use Authorization (EUA). This EUA will remain  in effect (meaning this test can be used) for the duration of the COVID-19 declaration under Section 56                          4(b)(1) of the Act, 21 U.S.C. section 360bbb-3(b)(1), unless the authorization is terminated or revoked sooner. Performed at [facility], [place] [address], [place], [place] [address]      X-Rays:No results found.  EKG: Orders placed or performed during the hospital encounter of 02/27/18   ED EKG   ED EKG   EKG 12-Lead   EKG 12-Lead   EKG     Hospital Course: Sierra Johnson is a 68 y.o. who was admitted to [facility]. They were brought to the operating room on 01/27/2019 and underwent Procedure(s): TOTAL KNEE ARTHROPLASTY.  Patient tolerated the procedure well and was later transferred to the recovery room and then to the orthopaedic floor for postoperative care. They were given PO and IV analgesics for pain control following their surgery. They were given 24 hours of postoperative antibiotics of  Anti-infectives (From admission, onward)   Start     Dose/Rate Route Frequency Ordered Stop   01/27/19 1400   ceFAZolin (ANCEF) IVPB 2g/100 mL premix     2 g 200 mL/hr over 30 Minutes Intravenous Every 6 hours 01/27/19 1112 01/27/19 2034   01/27/19 0615  ceFAZolin (ANCEF) IVPB 2g/100 mL premix     2 g 200 mL/hr over 30 Minutes Intravenous On call to O.R. 01/27/19 4008 01/27/19 6761     and started on DVT prophylaxis in the form of Eliquis.   PT and OT were ordered for total joint protocol. Discharge planning consulted to help with postop disposition and equipment needs.  Patient had a good night on the evening of surgery. They started to get up OOB with therapy on POD #0. Pt was seen during rounds and was ready to go home pending progress with therapy. Hemovac drain was pulled without difficulty. She worked with therapy on POD #1 and was meeting her goals. Pt was discharged to home later that day in stable condition.  Diet: Diabetic diet Activity: WBAT Follow-up: in 2 weeks Disposition: Home with OPPT at [facility] Discharged Condition: stable   Discharge Instructions    Call MD / Call 911   Complete by: As directed    If you experience chest pain or shortness of breath, CALL 911 and be transported to the hospital emergency room.  If you develope a fever above 101 F, pus (white drainage) or increased drainage or redness at the wound, or calf pain, call your surgeon's office.   Change dressing   Complete by: As directed    Change dressing on Wednesday, then change the dressing daily with sterile 4 x 4 inch gauze dressing and apply TED hose.   Constipation Prevention   Complete by: As directed    Drink plenty of fluids.  Prune juice may be helpful.  You may use a stool softener, such as Colace (over the counter) 100 mg twice a day.  Use MiraLax (over the counter) for constipation as needed.   Diet - low sodium heart healthy   Complete by: As directed    Discharge instructions   Complete by: As directed    Dr. Gaynelle Arabian Total Joint Specialist [facility] [address] [place], [place] [address] [phone]  TOTAL KNEE REPLACEMENT POSTOPERATIVE DIRECTIONS  Knee Rehabilitation, Guidelines  Following Surgery  Results after knee surgery are often greatly improved when you follow the exercise, range of motion and muscle strengthening exercises prescribed by your doctor. Safety measures are also important to protect the knee from further injury. Any time any of these exercises cause you to have increased pain or swelling in your knee joint, decrease the amount until you are comfortable again and slowly increase them. If you have problems or questions, call your caregiver or physical therapist for advice.   HOME CARE INSTRUCTIONS  Remove items at home which could result in a fall. This includes throw rugs or furniture in walking pathways.  ICE to the affected knee every three hours for 30 minutes at a time and then as needed for pain and swelling.  Continue to use ice on the knee for pain and swelling from surgery. You may notice swelling that will progress down to the foot and ankle.  This is normal after surgery.  Elevate the leg when you are not up walking on it.   Continue to use the breathing machine which will help keep your temperature down.  It is common for your temperature to cycle up and down following surgery, especially at night when you are not up moving around and exerting yourself.  The breathing machine keeps your lungs expanded and your temperature down. Do not place pillow under knee, focus on keeping the knee straight while resting   DIET You may resume your previous home diet once your are discharged from the hospital.  DRESSING / WOUND CARE / SHOWERING You may change your dressing 3-5 days after surgery.  Then change the dressing every day with sterile gauze.  Please use good hand washing techniques before changing the dressing.  Do not use any lotions or creams on the incision until instructed by your surgeon. You may start showering once you  are discharged home but do not submerge the incision under water. Just pat the incision dry and apply a dry gauze dressing on daily. Change the surgical dressing daily and reapply a dry dressing each time.  ACTIVITY Walk with your walker as instructed. Use walker as long as suggested by your caregivers. Avoid periods of inactivity such as sitting longer than an hour when not asleep. This helps prevent blood clots.  You may resume a sexual relationship in one month or when given the OK by your doctor.  You may return to work once you are cleared by your doctor.  Do not drive a car for 6 weeks or until released by you surgeon.  Do not drive while taking narcotics.  WEIGHT BEARING Weight bearing as tolerated with assist device (walker, cane, etc) as directed, use it as long as suggested by your surgeon or therapist, typically at least 4-6 weeks.  POSTOPERATIVE CONSTIPATION PROTOCOL Constipation - defined medically as fewer than three stools per week and severe constipation as less than one stool per week.  One of the most common issues patients have following surgery is constipation.  Even if you have a regular bowel pattern at home, your normal regimen is likely to be disrupted due to multiple reasons following surgery.  Combination of anesthesia, postoperative narcotics, change in appetite and fluid intake all can affect your bowels.  In order to avoid complications following surgery, here are some recommendations in order to help you during your recovery period.  Colace (docusate) - Pick up an over-the-counter form of Colace or another stool softener and take twice a day as long  as you are requiring postoperative pain medications.  Take with a full glass of water daily.  If you experience loose stools or diarrhea, hold the colace until you stool forms back up.  If your symptoms do not get better within 1 week or if they get worse, check with your doctor.  Dulcolax (bisacodyl) - Pick up  over-the-counter and take as directed by the product packaging as needed to assist with the movement of your bowels.  Take with a full glass of water.  Use this product as needed if not relieved by Colace only.   MiraLax (polyethylene glycol) - Pick up over-the-counter to have on hand.  MiraLax is a solution that will increase the amount of water in your bowels to assist with bowel movements.  Take as directed and can mix with a glass of water, juice, soda, coffee, or tea.  Take if you go more than two days without a movement. Do not use MiraLax more than once per day. Call your doctor if you are still constipated or irregular after using this medication for 7 days in a row.  If you continue to have problems with postoperative constipation, please contact the office for further assistance and recommendations.  If you experience "the worst abdominal pain ever" or develop nausea or vomiting, please contact the office immediatly for further recommendations for treatment.  ITCHING  If you experience itching with your medications, try taking only a single pain pill, or even half a pain pill at a time.  You can also use Benadryl over the counter for itching or also to help with sleep.   TED HOSE STOCKINGS Wear the elastic stockings on both legs for three weeks following surgery during the day but you may remove then at night for sleeping.  MEDICATIONS See your medication summary on the "After Visit Summary" that the nursing staff will review with you prior to discharge.  You may have some home medications which will be placed on hold until you complete the course of blood thinner medication.  It is important for you to complete the blood thinner medication as prescribed by your surgeon.  Continue your approved medications as instructed at time of discharge.  PRECAUTIONS If you experience chest pain or shortness of breath - call 911 immediately for transfer to the hospital emergency department.  If you  develop a fever greater that 101 F, purulent drainage from wound, increased redness or drainage from wound, foul odor from the wound/dressing, or calf pain - CONTACT YOUR SURGEON.                                                   FOLLOW-UP APPOINTMENTS Make sure you keep all of your appointments after your operation with your surgeon and caregivers. You should call the office at the above phone number and make an appointment for approximately two weeks after the date of your surgery or on the date instructed by your surgeon outlined in the "After Visit Summary".   RANGE OF MOTION AND STRENGTHENING EXERCISES  Rehabilitation of the knee is important following a knee injury or an operation. After just a few days of immobilization, the muscles of the thigh which control the knee become weakened and shrink (atrophy). Knee exercises are designed to build up the tone and strength of the thigh muscles and to improve knee  motion. Often times heat used for twenty to thirty minutes before working out will loosen up your tissues and help with improving the range of motion but do not use heat for the first two weeks following surgery. These exercises can be done on a training (exercise) mat, on the floor, on a table or on a bed. Use what ever works the best and is most comfortable for you Knee exercises include:  Leg Lifts - While your knee is still immobilized in a splint or cast, you can do straight leg raises. Lift the leg to 60 degrees, hold for 3 sec, and slowly lower the leg. Repeat 10-20 times 2-3 times daily. Perform this exercise against resistance later as your knee gets better.  Quad and Hamstring Sets - Tighten up the muscle on the front of the thigh (Quad) and hold for 5-10 sec. Repeat this 10-20 times hourly. Hamstring sets are done by pushing the foot backward against an object and holding for 5-10 sec. Repeat as with quad sets.  Leg Slides: Lying on your back, slowly slide your foot toward your  buttocks, bending your knee up off the floor (only go as far as is comfortable). Then slowly slide your foot back down until your leg is flat on the floor again. Angel Wings: Lying on your back spread your legs to the side as far apart as you can without causing discomfort.  A rehabilitation program following serious knee injuries can speed recovery and prevent re-injury in the future due to weakened muscles. Contact your doctor or a physical therapist for more information on knee rehabilitation.   IF YOU ARE TRANSFERRED TO A SKILLED REHAB FACILITY If the patient is transferred to a skilled rehab facility following release from the hospital, a list of the current medications will be sent to the facility for the patient to continue.  When discharged from the skilled rehab facility, please have the facility set up the patient's [place] prior to being released. Also, the skilled facility will be responsible for providing the patient with their medications at time of release from the facility to include their pain medication, the muscle relaxants, and their blood thinner medication. If the patient is still at the rehab facility at time of the two week follow up appointment, the skilled rehab facility will also need to assist the patient in arranging follow up appointment in our office and any transportation needs.  MAKE SURE YOU:  Understand these instructions.  Get help right away if you are not doing well or get worse.    Pick up stool softner and laxative for home use following surgery while on pain medications. Do not submerge incision under water. Please use good hand washing techniques while changing dressing each day. May shower starting three days after surgery. Please use a clean towel to pat the incision dry following showers. Continue to use ice for pain and swelling after surgery. Do not use any lotions or creams on the incision until instructed by your surgeon.   Do  not put a pillow under the knee. Place it under the heel.   Complete by: As directed    Driving restrictions   Complete by: As directed    No driving for two weeks   TED hose   Complete by: As directed    Use stockings (TED hose) for three weeks on both leg(s).  You may remove them at night for sleeping.   Weight bearing as tolerated   Complete  by: As directed      Allergies as of 01/28/2019      Reactions   Benazepril Swelling, Other (See Comments)   Lip swelling   Amlodipine Swelling, Other (See Comments)   LE swelling on 10 mg   Tetracycline Nausea And Vomiting      Medication List    STOP taking these medications   cefdinir 300 MG capsule Commonly known as: OMNICEF   methylPREDNISolone 4 MG Tbpk tablet Commonly known as: MEDROL DOSEPAK   traZODone 50 MG tablet Commonly known as: DESYREL     TAKE these medications   Accu-Chek FastClix Lancets Misc USE TO CHECK BLOOD SUGARS TWICE A DAY   albuterol 108 (90 Base) MCG/ACT inhaler Commonly known as: VENTOLIN HFA Inhale 1-2 puffs into the lungs every 6 (six) hours as needed for wheezing or shortness of breath.   apixaban 2.5 MG Tabs tablet Commonly known as: Eliquis Take 1 tablet (2.5 mg total) by mouth 2 (two) times daily.   BLACK CURRANT SEED OIL PO Take 1,250 mg by mouth daily after lunch.   CALCIUM PO Take 1,000 mg by mouth daily after lunch.   ECHINACEA PO Take 1,200 mg by mouth daily after lunch.   ELDERBERRY PO Take 5-10 mLs by mouth daily after lunch.   FLAX SEED OIL PO Take 1,250 mg by mouth daily after lunch.   fluticasone 50 MCG/ACT nasal spray Commonly known as: FLONASE Place 2 sprays into both nostrils daily. What changed:   when to take this  reasons to take this   gabapentin 300 MG capsule Commonly known as: NEURONTIN Take 1 capsule (300 mg total) by mouth 3 (three) times daily. Take 300 mg TID x 2 weeks, then 300 mg BID x 2 weeks, then 300 mg QD x 2 weeks   glucose blood test  strip Commonly known as: Accu-Chek Guide UUSE TO CHECK BLOOD SUGARS TWICE A DAY   glucose monitoring kit monitoring kit 1 each by Does not apply route 2 (two) times daily. Dx: E11.9, I10   Accu-Chek Guide w/Device Kit 1 each by Does not apply route 2 (two) times daily. DX: E11.9   hydrochlorothiazide 12.5 MG capsule Commonly known as: MICROZIDE Take 1 capsule by mouth once daily What changed: when to take this   ipratropium-albuterol 0.5-2.5 (3) MG/3ML Soln Commonly known as: DUONEB Take 3 mLs by nebulization every 6 (six) hours as needed. What changed: reasons to take this   loratadine 10 MG tablet Commonly known as: CLARITIN Take 1 tablet (10 mg total) by mouth daily. What changed:   when to take this  reasons to take this   Magnesium 400 MG Tabs Take 400 mg by mouth daily after lunch.   metFORMIN 500 MG tablet Commonly known as: GLUCOPHAGE TAKE 1 TABLET BY MOUTH 3 TIMES DAILY What changed: when to take this   methocarbamol 500 MG tablet Commonly known as: ROBAXIN Take 1 tablet (500 mg total) by mouth every 6 (six) hours as needed for muscle spasms.   Nebulizer/Tubing/Mouthpiece Kit every 4-6 hours   Omega-3 1000 MG Caps Take 1,000 mg by mouth daily after lunch.   oxyCODONE 5 MG immediate release tablet Commonly known as: Oxy IR/ROXICODONE Take 1-2 tablets (5-10 mg total) by mouth every 6 (six) hours as needed for severe pain. Not to exceed 6 tablets a day.   Promethazine-Codeine 6.25-10 MG/5ML Soln Take 5 mLs by mouth every 4 (four) hours as needed. What changed: reasons to take this  Toujeo SoloStar 300 UNIT/ML Sopn Generic drug: Insulin Glargine (1 Unit Dial) INJECT 18 UNITS INTO THE SKIN EVERY MORNING. What changed: See the new instructions.   traMADol 50 MG tablet Commonly known as: ULTRAM Take 1-2 tablets (50-100 mg total) by mouth every 6 (six) hours as needed for moderate pain.   vitamin B-12 500 MCG tablet Commonly known as:  CYANOCOBALAMIN Take 500 mcg by mouth daily after lunch.   vitamin C 1000 MG tablet Take 1,000 mg by mouth daily with lunch.   Vitamin D3 25 MCG (1000 UT) Caps Take 1,000 Units by mouth daily after lunch.            Discharge Care Instructions  (From admission, onward)         Start     Ordered   01/28/19 0000  Weight bearing as tolerated     01/28/19 0727   01/28/19 0000  Change dressing    Comments: Change dressing on Wednesday, then change the dressing daily with sterile 4 x 4 inch gauze dressing and apply TED hose.   01/28/19 5183         Follow-up Information    Gaynelle Arabian, MD. Schedule an appointment as soon as possible for a visit on 02/11/2019.   Specialty: Orthopedic Surgery Contact information: [address] [place] [address] [phone]           Signed: Theresa Duty, PA-C Orthopedic Surgery 01/29/2019, 10:18 AM

## 2019-01-31 DIAGNOSIS — M25661 Stiffness of right knee, not elsewhere classified: Secondary | ICD-10-CM | POA: Diagnosis not present

## 2019-02-05 DIAGNOSIS — M25661 Stiffness of right knee, not elsewhere classified: Secondary | ICD-10-CM | POA: Diagnosis not present

## 2019-02-07 DIAGNOSIS — M25661 Stiffness of right knee, not elsewhere classified: Secondary | ICD-10-CM | POA: Diagnosis not present

## 2019-02-12 DIAGNOSIS — M25661 Stiffness of right knee, not elsewhere classified: Secondary | ICD-10-CM | POA: Diagnosis not present

## 2019-02-14 DIAGNOSIS — M25661 Stiffness of right knee, not elsewhere classified: Secondary | ICD-10-CM | POA: Diagnosis not present

## 2019-02-18 DIAGNOSIS — M25661 Stiffness of right knee, not elsewhere classified: Secondary | ICD-10-CM | POA: Diagnosis not present

## 2019-02-20 DIAGNOSIS — M25661 Stiffness of right knee, not elsewhere classified: Secondary | ICD-10-CM | POA: Diagnosis not present

## 2019-02-25 DIAGNOSIS — M25661 Stiffness of right knee, not elsewhere classified: Secondary | ICD-10-CM | POA: Diagnosis not present

## 2019-02-27 DIAGNOSIS — M25661 Stiffness of right knee, not elsewhere classified: Secondary | ICD-10-CM | POA: Diagnosis not present

## 2019-03-03 DIAGNOSIS — M25661 Stiffness of right knee, not elsewhere classified: Secondary | ICD-10-CM | POA: Diagnosis not present

## 2019-03-04 DIAGNOSIS — Z471 Aftercare following joint replacement surgery: Secondary | ICD-10-CM | POA: Diagnosis not present

## 2019-03-04 DIAGNOSIS — Z96651 Presence of right artificial knee joint: Secondary | ICD-10-CM | POA: Diagnosis not present

## 2019-03-06 DIAGNOSIS — M25661 Stiffness of right knee, not elsewhere classified: Secondary | ICD-10-CM | POA: Diagnosis not present

## 2019-03-11 DIAGNOSIS — M25661 Stiffness of right knee, not elsewhere classified: Secondary | ICD-10-CM | POA: Diagnosis not present

## 2019-03-13 DIAGNOSIS — M25661 Stiffness of right knee, not elsewhere classified: Secondary | ICD-10-CM | POA: Diagnosis not present

## 2019-03-14 ENCOUNTER — Other Ambulatory Visit: Payer: Self-pay | Admitting: *Deleted

## 2019-03-14 DIAGNOSIS — E669 Obesity, unspecified: Secondary | ICD-10-CM

## 2019-03-14 DIAGNOSIS — E1169 Type 2 diabetes mellitus with other specified complication: Secondary | ICD-10-CM

## 2019-03-14 DIAGNOSIS — I2699 Other pulmonary embolism without acute cor pulmonale: Secondary | ICD-10-CM

## 2019-03-14 MED ORDER — APIXABAN 2.5 MG PO TABS: 2.5000 mg | ORAL_TABLET | Freq: Two times a day (BID) | ORAL | 3 refills | Status: DC

## 2019-03-17 ENCOUNTER — Other Ambulatory Visit: Payer: Self-pay | Admitting: Internal Medicine

## 2019-03-18 DIAGNOSIS — M25661 Stiffness of right knee, not elsewhere classified: Secondary | ICD-10-CM | POA: Diagnosis not present

## 2019-03-20 DIAGNOSIS — M25661 Stiffness of right knee, not elsewhere classified: Secondary | ICD-10-CM | POA: Diagnosis not present

## 2019-03-22 ENCOUNTER — Ambulatory Visit (INDEPENDENT_AMBULATORY_CARE_PROVIDER_SITE_OTHER): Payer: Medicare Other

## 2019-03-22 DIAGNOSIS — Z23 Encounter for immunization: Secondary | ICD-10-CM | POA: Diagnosis not present

## 2019-03-24 ENCOUNTER — Ambulatory Visit: Payer: Self-pay | Admitting: *Deleted

## 2019-03-24 NOTE — Telephone Encounter (Signed)
noted 

## 2019-03-24 NOTE — Telephone Encounter (Signed)
Patient is calling with concerns that her BP is fluctuating and not staying on even level. Patient states she is having intermittent headaches with this as well.Appointment has been scheduled for Thursday, 10/8. Patient is going to bring her record with her so it can be reviewed. Please call her if this is not a good time- patient has been instructed on symptoms to be aware of and when to go to ED for them.  Reason for Disposition . Systolic BP  >= 0000000 OR Diastolic >= [id number]  Answer Assessment - Initial Assessment Questions 1. BLOOD PRESSURE: "What is the blood pressure?" "Did you take at least two measurements 5 minutes apart?"     165/92- last night 2. ONSET: "When did you take your blood pressure?"     Last night- patient is in car now and can not BP reading now 3. HOW: "How did you obtain the blood pressure?" (e.g., visiting nurse, automatic home BP monitor)     Automatic BP cuff- wrist 4. HISTORY: "Do you have a history of high blood pressure?"     yes 5. MEDICATIONS: "Are you taking any medications for blood pressure?" "Have you missed any doses recently?"     Yes- no changes recently 6. OTHER SYMPTOMS: "Do you have any symptoms?" (e.g., headache, chest pain, blurred vision, difficulty breathing, weakness)     headache 7. PREGNANCY: "Is there any chance you are pregnant?" "When was your last menstrual period?"     n/a  Protocols used: HIGH BLOOD PRESSURE-A-AH

## 2019-03-27 ENCOUNTER — Other Ambulatory Visit: Payer: Self-pay

## 2019-03-27 ENCOUNTER — Ambulatory Visit (INDEPENDENT_AMBULATORY_CARE_PROVIDER_SITE_OTHER): Payer: Medicare Other | Admitting: Internal Medicine

## 2019-03-27 ENCOUNTER — Encounter: Payer: Self-pay | Admitting: Internal Medicine

## 2019-03-27 ENCOUNTER — Ambulatory Visit: Payer: Medicare Other | Admitting: Internal Medicine

## 2019-03-27 VITALS — BP 166/98 | HR 72 | Temp 98.3°F | Ht 60.0 in | Wt 201.0 lb

## 2019-03-27 DIAGNOSIS — E785 Hyperlipidemia, unspecified: Secondary | ICD-10-CM | POA: Diagnosis not present

## 2019-03-27 DIAGNOSIS — E669 Obesity, unspecified: Secondary | ICD-10-CM | POA: Diagnosis not present

## 2019-03-27 DIAGNOSIS — E1169 Type 2 diabetes mellitus with other specified complication: Secondary | ICD-10-CM | POA: Diagnosis not present

## 2019-03-27 DIAGNOSIS — I2699 Other pulmonary embolism without acute cor pulmonale: Secondary | ICD-10-CM

## 2019-03-27 DIAGNOSIS — Z0000 Encounter for general adult medical examination without abnormal findings: Secondary | ICD-10-CM

## 2019-03-27 DIAGNOSIS — M25661 Stiffness of right knee, not elsewhere classified: Secondary | ICD-10-CM | POA: Diagnosis not present

## 2019-03-27 MED ORDER — OLMESARTAN MEDOXOMIL 20 MG PO TABS: 20.0000 mg | ORAL_TABLET | Freq: Every day | ORAL | 3 refills | Status: DC

## 2019-03-27 NOTE — Progress Notes (Signed)
Subjective:  Patient ID: Sierra Johnson, female    DOB: 30-Aug-1950  Age: 68 y.o. MRN: [id number]  CC: No chief complaint on file.   HPI Sierra Johnson presents for BP fluctuating: 147/102 S/p R TKR 01/27/19 Dr Wynelle Link F/u PE on Eliquis  Outpatient Medications Prior to Visit  Medication Sig Dispense Refill  . Accu-Chek FastClix Lancets MISC USE TO CHECK BLOOD SUGARS TWICE A DAY 102 each 0  . albuterol (VENTOLIN HFA) 108 (90 Base) MCG/ACT inhaler Inhale 1-2 puffs into the lungs every 6 (six) hours as needed for wheezing or shortness of breath. 8 g 0  . apixaban (ELIQUIS) 2.5 MG TABS tablet Take 1 tablet (2.5 mg total) by mouth 2 (two) times daily. 180 tablet 3  . Ascorbic Acid (VITAMIN C) 1000 MG tablet Take 1,000 mg by mouth daily with lunch.     [name] BLACK CURRANT SEED OIL PO Take 1,250 mg by mouth daily after lunch.     . Blood Glucose Monitoring Suppl (ACCU-CHEK GUIDE) w/Device KIT 1 each by Does not apply route 2 (two) times daily. DX: E11.9 1 kit 0  . CALCIUM PO Take 1,000 mg by mouth daily after lunch.     . Cholecalciferol (VITAMIN D3) 1000 UNITS CAPS Take 1,000 Units by mouth daily after lunch.     . ECHINACEA PO Take 1,200 mg by mouth daily after lunch.     . ELDERBERRY PO Take 5-10 mLs by mouth daily after lunch.     . Flaxseed, Linseed, (FLAX SEED OIL PO) Take 1,250 mg by mouth daily after lunch.    . fluticasone (FLONASE) 50 MCG/ACT nasal spray Place 2 sprays into both nostrils daily. (Patient taking differently: Place 2 sprays into both nostrils daily as needed for allergies. ) 16 g 6  . gabapentin (NEURONTIN) 300 MG capsule Take 1 capsule (300 mg total) by mouth 3 (three) times daily. Take 300 mg TID x 2 weeks, then 300 mg BID x 2 weeks, then 300 mg QD x 2 weeks 84 capsule 0  . glucose blood (ACCU-CHEK GUIDE) test strip UUSE TO CHECK BLOOD SUGARS TWICE A DAY 200 each 3  . glucose monitoring kit (FREESTYLE) monitoring kit 1 each by Does not apply route 2 (two) times daily. Dx:  E11.9, I10 1 each 0  . hydrochlorothiazide (MICROZIDE) 12.5 MG capsule Take 1 capsule by mouth once daily 30 capsule 1  . ipratropium-albuterol (DUONEB) 0.5-2.5 (3) MG/3ML SOLN Take 3 mLs by nebulization every 6 (six) hours as needed. (Patient taking differently: Take 3 mLs by nebulization every 6 (six) hours as needed (shortness of breath). ) 360 mL 1  . loratadine (CLARITIN) 10 MG tablet Take 1 tablet (10 mg total) by mouth daily. (Patient taking differently: Take 10 mg by mouth daily as needed for allergies. ) 30 tablet 11  . Magnesium 400 MG TABS Take 400 mg by mouth daily after lunch.     . metFORMIN (GLUCOPHAGE) 500 MG tablet TAKE 1 TABLET BY MOUTH 3 TIMES DAILY (Patient taking differently: Take 500 mg by mouth 2 (two) times daily with a meal. ) 270 tablet 3  . methocarbamol (ROBAXIN) 500 MG tablet Take 1 tablet (500 mg total) by mouth every 6 (six) hours as needed for muscle spasms. 40 tablet 0  . Omega-3 1000 MG CAPS Take 1,000 mg by mouth daily after lunch.    . oxyCODONE (OXY IR/ROXICODONE) 5 MG immediate release tablet Take 1-2 tablets (5-10 mg total) by mouth every  6 (six) hours as needed for severe pain. Not to exceed 6 tablets a day. 56 tablet 0  . Promethazine-Codeine 6.25-10 MG/5ML SOLN Take 5 mLs by mouth every 4 (four) hours as needed. (Patient taking differently: Take 5 mLs by mouth every 4 (four) hours as needed (cough). ) 240 mL 0  . Respiratory Therapy Supplies (NEBULIZER/TUBING/MOUTHPIECE) KIT every 4-6 hours 1 each 3  . TOUJEO SOLOSTAR 300 UNIT/ML SOPN INJECT 18 UNITS INTO THE SKIN EVERY MORNING. (Patient taking differently: 18 Units by Subconjunctival route daily as needed (for blood sugars greater than 120). ) 13.5 pen 9  . traMADol (ULTRAM) 50 MG tablet Take 1-2 tablets (50-100 mg total) by mouth every 6 (six) hours as needed for moderate pain. 40 tablet 0  . vitamin B-12 (CYANOCOBALAMIN) 500 MCG tablet Take 500 mcg by mouth daily after lunch.      No facility-administered  medications prior to visit.     ROS: Review of Systems  Constitutional: Negative for activity change, appetite change, chills, fatigue and unexpected weight change.  HENT: Negative for congestion, mouth sores and sinus pressure.   Eyes: Negative for visual disturbance.  Respiratory: Negative for cough and chest tightness.   Gastrointestinal: Negative for abdominal pain and nausea.  Genitourinary: Negative for difficulty urinating, frequency and vaginal pain.  Musculoskeletal: Positive for arthralgias and gait problem. Negative for back pain.  Skin: Negative for pallor and rash.  Neurological: Negative for dizziness, tremors, weakness, numbness and headaches.  Psychiatric/Behavioral: Negative for confusion and sleep disturbance.    Objective:  BP (!) 166/98 (BP Location: Left Arm, Patient Position: Sitting, Cuff Size: Large)   Pulse 72   Temp 98.3 F (36.8 C) (Oral)   Ht 5' (1.524 m)   Wt 201 lb (91.2 kg)   SpO2 99%   BMI 39.26 kg/m   BP Readings from Last 3 Encounters:  03/27/19 (!) 166/98  01/28/19 (!) 142/85  01/23/19 135/83    Wt Readings from Last 3 Encounters:  03/27/19 201 lb (91.2 kg)  01/27/19 198 lb (89.8 kg)  01/23/19 198 lb (89.8 kg)    Physical Exam Constitutional:      General: She is not in acute distress.    Appearance: She is well-developed. She is obese.  HENT:     Head: Normocephalic.     Right Ear: External ear normal.     Left Ear: External ear normal.     Nose: Nose normal.  Eyes:     General:        Right eye: No discharge.        Left eye: No discharge.     Conjunctiva/sclera: Conjunctivae normal.     Pupils: Pupils are equal, round, and reactive to light.  Neck:     Musculoskeletal: Normal range of motion and neck supple.     Thyroid: No thyromegaly.     Vascular: No JVD.     Trachea: No tracheal deviation.  Cardiovascular:     Rate and Rhythm: Normal rate and regular rhythm.     Heart sounds: Normal heart sounds.  Pulmonary:      Effort: No respiratory distress.     Breath sounds: No stridor. No wheezing.  Abdominal:     General: Bowel sounds are normal. There is no distension.     Palpations: Abdomen is soft. There is no mass.     Tenderness: There is no abdominal tenderness. There is no guarding or rebound.  Musculoskeletal:  General: No tenderness.  Lymphadenopathy:     Cervical: No cervical adenopathy.  Skin:    Findings: No erythema or rash.  Neurological:     Cranial Nerves: No cranial nerve deficit.     Motor: No abnormal muscle tone.     Coordination: Coordination normal.     Gait: Gait abnormal.     Deep Tendon Reflexes: Reflexes normal.  Psychiatric:        Behavior: Behavior normal.        Thought Content: Thought content normal.        Judgment: Judgment normal.   limping post-op  Lab Results  Component Value Date   WBC 16.1 (H) 01/28/2019   HGB 10.6 (L) 01/28/2019   HCT 33.9 (L) 01/28/2019   PLT 280 01/28/2019   GLUCOSE 144 (H) 01/28/2019   CHOL 220 (H) 04/27/2017   TRIG 60.0 04/27/2017   HDL 79.80 04/27/2017   LDLDIRECT 119.5 08/27/2012   LDLCALC 128 (H) 04/27/2017   ALT 19 01/23/2019   AST 23 01/23/2019   NA 133 (L) 01/28/2019   K 5.0 01/28/2019   CL 98 01/28/2019   CREATININE 0.94 01/28/2019   BUN 12 01/28/2019   CO2 25 01/28/2019   TSH 1.41 04/27/2017   INR 1.0 01/23/2019   HGBA1C 6.2 (H) 01/23/2019   MICROALBUR 37.9 (H) 04/27/2017    No results found.  Assessment & Plan:     Follow-up: No follow-ups on file.  Walker Kehr, MD

## 2019-03-27 NOTE — Patient Instructions (Signed)
If you have medicare related insurance (such as traditional Medicare, Blue Cross Medicare, United HealthCare Medicare, or similar), Please make an appointment at the scheduling desk with Jill, the Wellness Health Coach, for your Wellness visit in this office, which is a benefit with your insurance.  

## 2019-03-30 ENCOUNTER — Encounter: Payer: Self-pay | Admitting: Internal Medicine

## 2019-03-31 ENCOUNTER — Encounter: Payer: Medicare Other | Admitting: Internal Medicine

## 2019-04-07 DIAGNOSIS — R0609 Other forms of dyspnea: Secondary | ICD-10-CM | POA: Diagnosis not present

## 2019-04-07 DIAGNOSIS — Z7901 Long term (current) use of anticoagulants: Secondary | ICD-10-CM | POA: Diagnosis not present

## 2019-04-07 DIAGNOSIS — R0600 Dyspnea, unspecified: Secondary | ICD-10-CM | POA: Diagnosis not present

## 2019-04-07 DIAGNOSIS — R05 Cough: Secondary | ICD-10-CM | POA: Diagnosis not present

## 2019-04-07 DIAGNOSIS — Z96651 Presence of right artificial knee joint: Secondary | ICD-10-CM | POA: Insufficient documentation

## 2019-04-07 DIAGNOSIS — R0689 Other abnormalities of breathing: Secondary | ICD-10-CM | POA: Diagnosis not present

## 2019-04-07 DIAGNOSIS — K219 Gastro-esophageal reflux disease without esophagitis: Secondary | ICD-10-CM | POA: Diagnosis not present

## 2019-04-07 DIAGNOSIS — Z86711 Personal history of pulmonary embolism: Secondary | ICD-10-CM | POA: Diagnosis not present

## 2019-04-11 DIAGNOSIS — R0689 Other abnormalities of breathing: Secondary | ICD-10-CM | POA: Diagnosis not present

## 2019-04-11 DIAGNOSIS — Z01812 Encounter for preprocedural laboratory examination: Secondary | ICD-10-CM | POA: Diagnosis not present

## 2019-04-11 DIAGNOSIS — R0600 Dyspnea, unspecified: Secondary | ICD-10-CM | POA: Diagnosis not present

## 2019-04-11 DIAGNOSIS — Z20828 Contact with and (suspected) exposure to other viral communicable diseases: Secondary | ICD-10-CM | POA: Diagnosis not present

## 2019-04-11 DIAGNOSIS — Z86711 Personal history of pulmonary embolism: Secondary | ICD-10-CM | POA: Diagnosis not present

## 2019-04-11 DIAGNOSIS — R05 Cough: Secondary | ICD-10-CM | POA: Diagnosis not present

## 2019-04-16 DIAGNOSIS — R0600 Dyspnea, unspecified: Secondary | ICD-10-CM | POA: Diagnosis not present

## 2019-04-16 DIAGNOSIS — R918 Other nonspecific abnormal finding of lung field: Secondary | ICD-10-CM | POA: Diagnosis not present

## 2019-04-16 DIAGNOSIS — J9811 Atelectasis: Secondary | ICD-10-CM | POA: Diagnosis not present

## 2019-04-16 DIAGNOSIS — R0989 Other specified symptoms and signs involving the circulatory and respiratory systems: Secondary | ICD-10-CM | POA: Diagnosis not present

## 2019-04-16 DIAGNOSIS — R0689 Other abnormalities of breathing: Secondary | ICD-10-CM | POA: Diagnosis not present

## 2019-04-16 DIAGNOSIS — R05 Cough: Secondary | ICD-10-CM | POA: Diagnosis not present

## 2019-04-29 ENCOUNTER — Other Ambulatory Visit (INDEPENDENT_AMBULATORY_CARE_PROVIDER_SITE_OTHER): Payer: Medicare Other

## 2019-04-29 DIAGNOSIS — E1169 Type 2 diabetes mellitus with other specified complication: Secondary | ICD-10-CM

## 2019-04-29 DIAGNOSIS — Z0000 Encounter for general adult medical examination without abnormal findings: Secondary | ICD-10-CM

## 2019-04-29 DIAGNOSIS — I2699 Other pulmonary embolism without acute cor pulmonale: Secondary | ICD-10-CM | POA: Diagnosis not present

## 2019-04-29 DIAGNOSIS — E669 Obesity, unspecified: Secondary | ICD-10-CM

## 2019-04-29 DIAGNOSIS — E785 Hyperlipidemia, unspecified: Secondary | ICD-10-CM | POA: Diagnosis not present

## 2019-04-29 LAB — CBC WITH DIFFERENTIAL/PLATELET
Basophils Absolute: 0 10*3/uL (ref 0.0–0.1)
Basophils Relative: 0.5 % (ref 0.0–3.0)
Eosinophils Absolute: 0.2 10*3/uL (ref 0.0–0.7)
Eosinophils Relative: 2.7 % (ref 0.0–5.0)
HCT: 37.1 % (ref 36.0–46.0)
Hemoglobin: 12.1 g/dL (ref 12.0–15.0)
Lymphocytes Relative: 36.6 % (ref 12.0–46.0)
Lymphs Abs: 3.2 10*3/uL (ref 0.7–4.0)
MCHC: 32.6 g/dL (ref 30.0–36.0)
MCV: 82.2 fl (ref 78.0–100.0)
Monocytes Absolute: 0.7 10*3/uL (ref 0.1–1.0)
Monocytes Relative: 8.6 % (ref 3.0–12.0)
Neutro Abs: 4.5 10*3/uL (ref 1.4–7.7)
Neutrophils Relative %: 51.6 % (ref 43.0–77.0)
Platelets: 335 10*3/uL (ref 150.0–400.0)
RBC: 4.51 Mil/uL (ref 3.87–5.11)
RDW: 15.9 % — ABNORMAL HIGH (ref 11.5–15.5)
WBC: 8.7 10*3/uL (ref 4.0–10.5)

## 2019-04-29 LAB — MICROALBUMIN / CREATININE URINE RATIO
Creatinine,U: 33.3 mg/dL
Microalb Creat Ratio: 66.4 mg/g — ABNORMAL HIGH (ref 0.0–30.0)
Microalb, Ur: 22.1 mg/dL — ABNORMAL HIGH (ref 0.0–1.9)

## 2019-04-29 LAB — HEMOGLOBIN A1C: Hgb A1c MFr Bld: 6.6 % — ABNORMAL HIGH (ref 4.6–6.5)

## 2019-04-29 LAB — URINALYSIS, ROUTINE W REFLEX MICROSCOPIC
Bilirubin Urine: NEGATIVE
Hgb urine dipstick: NEGATIVE
Ketones, ur: NEGATIVE
Leukocytes,Ua: NEGATIVE
Nitrite: NEGATIVE
RBC / HPF: NONE SEEN (ref 0–?)
Specific Gravity, Urine: 1.01 (ref 1.000–1.030)
Total Protein, Urine: 30 — AB
Urine Glucose: NEGATIVE
Urobilinogen, UA: 0.2 (ref 0.0–1.0)
WBC, UA: NONE SEEN (ref 0–?)
pH: 7 (ref 5.0–8.0)

## 2019-04-29 LAB — TSH: TSH: 2.21 u[IU]/mL (ref 0.35–4.50)

## 2019-04-29 LAB — LIPID PANEL
Cholesterol: 254 mg/dL — ABNORMAL HIGH (ref 0–200)
HDL: 73.3 mg/dL (ref >39.00)
LDL Cholesterol: 163 mg/dL — ABNORMAL HIGH (ref 0–99)
NonHDL: 180.25
Total CHOL/HDL Ratio: 3
Triglycerides: 84 mg/dL (ref 0.0–149.0)
VLDL: 16.8 mg/dL (ref 0.0–40.0)

## 2019-04-29 LAB — BASIC METABOLIC PANEL
BUN: 16 mg/dL (ref 6–23)
CO2: 26 mEq/L (ref 19–32)
Calcium: 9.7 mg/dL (ref 8.4–10.5)
Chloride: 101 mEq/L (ref 96–112)
Creatinine, Ser: 1.02 mg/dL (ref 0.40–1.20)
GFR: 65.62 mL/min (ref >60.00)
Glucose, Bld: 106 mg/dL — ABNORMAL HIGH (ref 70–99)
Potassium: 4.5 mEq/L (ref 3.5–5.1)
Sodium: 135 mEq/L (ref 135–145)

## 2019-04-29 LAB — HEPATIC FUNCTION PANEL
ALT: 13 U/L (ref 0–35)
AST: 14 U/L (ref 0–37)
Albumin: 3.9 g/dL (ref 3.5–5.2)
Alkaline Phosphatase: 84 U/L (ref 39–117)
Bilirubin, Direct: 0.1 mg/dL (ref 0.0–0.3)
Total Bilirubin: 0.4 mg/dL (ref 0.2–1.2)
Total Protein: 7.6 g/dL (ref 6.0–8.3)

## 2019-05-07 ENCOUNTER — Other Ambulatory Visit: Payer: Self-pay

## 2019-05-07 ENCOUNTER — Encounter: Payer: Self-pay | Admitting: Internal Medicine

## 2019-05-07 ENCOUNTER — Ambulatory Visit (INDEPENDENT_AMBULATORY_CARE_PROVIDER_SITE_OTHER): Payer: Medicare Other | Admitting: Internal Medicine

## 2019-05-07 VITALS — BP 136/86 | HR 74 | Temp 98.6°F | Ht 60.0 in | Wt 205.0 lb

## 2019-05-07 DIAGNOSIS — Z23 Encounter for immunization: Secondary | ICD-10-CM

## 2019-05-07 DIAGNOSIS — E538 Deficiency of other specified B group vitamins: Secondary | ICD-10-CM

## 2019-05-07 DIAGNOSIS — Z0000 Encounter for general adult medical examination without abnormal findings: Secondary | ICD-10-CM | POA: Diagnosis not present

## 2019-05-07 DIAGNOSIS — E785 Hyperlipidemia, unspecified: Secondary | ICD-10-CM

## 2019-05-07 MED ORDER — METFORMIN HCL 500 MG PO TABS: 500.0000 mg | ORAL_TABLET | Freq: Two times a day (BID) | ORAL | 3 refills | Status: DC

## 2019-05-07 MED ORDER — TOUJEO SOLOSTAR 300 UNIT/ML ~~LOC~~ SOPN: 18.0000 [IU] | PEN_INJECTOR | Freq: Every day | SUBCUTANEOUS | 3 refills | Status: DC | PRN

## 2019-05-07 MED ORDER — LORATADINE 10 MG PO TABS: 10.0000 mg | ORAL_TABLET | Freq: Every day | ORAL | 3 refills | Status: DC

## 2019-05-07 MED ORDER — PRAVASTATIN SODIUM 20 MG PO TABS: 20.0000 mg | ORAL_TABLET | Freq: Every day | ORAL | 3 refills | Status: DC

## 2019-05-07 NOTE — Assessment & Plan Note (Signed)
On B12 

## 2019-05-07 NOTE — Assessment & Plan Note (Signed)
CT angio - no significant stenosis in the coronaries 9/19 Pravastatin  Labs

## 2019-05-07 NOTE — Assessment & Plan Note (Signed)
We discussed age appropriate health related issues, including available/recomended screening tests and vaccinations. We discussed a need for adhering to healthy di et and exercise. Labs were ordered. All questions were answered. She is a Sports administrator. Colon 2008 Cologuard 2018 Shingrix, pneumovax discussed GYN and mammo yearly Prevnar, tDAP 2020 The patient is here for annual Medicare wellness examination and management of other chronic and acute problems.   The risk factors are reflected in the social history.  The roster of all physicians providing medical care to patient - is listed in the Snapshot section of the chart.  Activities of daily living:  The patient is 100% inedpendent in all ADLs: dressing, toileting, feeding as well as independent mobility  Home safety : good. Using seatbelts. There is no violence in the home.   There is no risks for hepatitis, STDs or HIV. There is no history of blood transfusion. They have no travel history to infectious disease endemic areas of the world.  The patient has  seen their dentist in the last 12 month. They have  seen their eye doctor in the last year. They deny  Any major hearing difficulty and have not had audiologic testing in the last year.  They do not  have excessive sun exposure. Discussed the need for sun protection: hats, long sleeves and use of sunscreen if there is significant sun exposure.   Diet: the importance of a healthy diet is discussed. They do have a reasonably healthy  diet.  The patient has a fairly regular exercise program of a mixed nature: walking, yard work, etc.The benefits of regular aerobic exercise were discussed.  Depression screen: there are no signs or vegative symptoms of depression- irritability, change in appetite, anhedonia, sadness/tearfullness.  Cognitive assessment: the patient manages all their financial and personal affairs and is actively engaged. They could relate day,date,year and events; recalled 3/3  objects at 3 minutes  The following portions of the patient's history were reviewed and updated as appropriate: allergies, current medications, past family history, past medical history,  past surgical history, past social history  and problem list.  Vision, hearing, body mass index were assessed and reviewed.   During the course of the visit the patient was educated and counseled about appropriate screening and preventive services including : fall prevention , diabetes screening, nutrition counseling, colorectal cancer screening, and recommended immunizations.

## 2019-05-07 NOTE — Patient Instructions (Addendum)
Health Maintenance for Postmenopausal Women Menopause is a normal process in which your ability to get pregnant comes to an end. This process happens slowly over many months or years, usually between the ages of 48 and 55. Menopause is complete when you have missed your menstrual periods for 12 months. It is important to talk with your health care provider about some of the most common conditions that affect women after menopause (postmenopausal women). These include heart disease, cancer, and bone loss (osteoporosis). Adopting a healthy lifestyle and getting preventive care can help to promote your health and wellness. The actions you take can also lower your chances of developing some of these common conditions. What should I know about menopause? During menopause, you may get a number of symptoms, such as:  Hot flashes. These can be moderate or severe.  Night sweats.  Decrease in sex drive.  Mood swings.  Headaches.  Tiredness.  Irritability.  Memory problems.  Insomnia. Choosing to treat or not to treat these symptoms is a decision that you make with your health care provider. Do I need hormone replacement therapy?  Hormone replacement therapy is effective in treating symptoms that are caused by menopause, such as hot flashes and night sweats.  Hormone replacement carries certain risks, especially as you become older. If you are thinking about using estrogen or estrogen with progestin, discuss the benefits and risks with your health care provider. What is my risk for heart disease and stroke? The risk of heart disease, heart attack, and stroke increases as you age. One of the causes may be a change in the body's hormones during menopause. This can affect how your body uses dietary fats, triglycerides, and cholesterol. Heart attack and stroke are medical emergencies. There are many things that you can do to help prevent heart disease and stroke. Watch your blood pressure  High  blood pressure causes heart disease and increases the risk of stroke. This is more likely to develop in people who have high blood pressure readings, are of African descent, or are overweight.  Have your blood pressure checked: ? Every 3-5 years if you are 18-39 years of age. ? Every year if you are 40 years old or older. Eat a healthy diet   Eat a diet that includes plenty of vegetables, fruits, low-fat dairy products, and lean protein.  Do not eat a lot of foods that are high in solid fats, added sugars, or sodium. Get regular exercise Get regular exercise. This is one of the most important things you can do for your health. Most adults should:  Try to exercise for at least 150 minutes each week. The exercise should increase your heart rate and make you sweat (moderate-intensity exercise).  Try to do strengthening exercises at least twice each week. Do these in addition to the moderate-intensity exercise.  Spend less time sitting. Even light physical activity can be beneficial. Other tips  Work with your health care provider to achieve or maintain a healthy weight.  Do not use any products that contain nicotine or tobacco, such as cigarettes, e-cigarettes, and chewing tobacco. If you need help quitting, ask your health care provider.  Know your numbers. Ask your health care provider to check your cholesterol and your blood sugar (glucose). Continue to have your blood tested as directed by your health care provider. Do I need screening for cancer? Depending on your health history and family history, you may need to have cancer screening at different stages of your life. This   may include screening for:  Breast cancer.  Cervical cancer.  Lung cancer.  Colorectal cancer. What is my risk for osteoporosis? After menopause, you may be at increased risk for osteoporosis. Osteoporosis is a condition in which bone destruction happens more quickly than new bone creation. To help prevent  osteoporosis or the bone fractures that can happen because of osteoporosis, you may take the following actions:  If you are 40-11 years old, get at least 1,000 mg of calcium and at least 600 mg of vitamin D per day.  If you are older than age 61 but younger than age 59, get at least 1,200 mg of calcium and at least 600 mg of vitamin D per day.  If you are older than age 46, get at least 1,200 mg of calcium and at least 800 mg of vitamin D per day. Smoking and drinking excessive alcohol increase the risk of osteoporosis. Eat foods that are rich in calcium and vitamin D, and do weight-bearing exercises several times each week as directed by your health care provider. How does menopause affect my mental health? Depression may occur at any age, but it is more common as you become older. Common symptoms of depression include:  Low or sad mood.  Changes in sleep patterns.  Changes in appetite or eating patterns.  Feeling an overall lack of motivation or enjoyment of activities that you previously enjoyed.  Frequent crying spells. Talk with your health care provider if you think that you are experiencing depression. General instructions See your health care provider for regular wellness exams and vaccines. This may include:  Scheduling regular health, dental, and eye exams.  Getting and maintaining your vaccines. These include: ? Influenza vaccine. Get this vaccine each year before the flu season begins. ? Pneumonia vaccine. ? Shingles vaccine. ? Tetanus, diphtheria, and pertussis (Tdap) booster vaccine. Your health care provider may also recommend other immunizations. Tell your health care provider if you have ever been abused or do not feel safe at home. Summary  Menopause is a normal process in which your ability to get pregnant comes to an end.  This condition causes hot flashes, night sweats, decreased interest in sex, mood swings, headaches, or lack of sleep.  Treatment for this  condition may include hormone replacement therapy.  Take actions to keep yourself healthy, including exercising regularly, eating a healthy diet, watching your weight, and checking your blood pressure and blood sugar levels.  Get screened for cancer and depression. Make sure that you are up to date with all your vaccines. This information is not intended to replace advice given to you by your health care provider. Make sure you discuss any questions you have with your health care provider. Document Released: 07/28/2005 Document Revised: 05/29/2018 Document Reviewed: 05/29/2018 Elsevier Patient Education  2020 [employer].   These suggestions will probably help you to improve your metabolism if you are not overweight and to lose weight if you are overweight: 1.  Reduce your consumption of sugars and starches.  Eliminate high fructose corn syrup from your diet.  Reduce your consumption of processed foods.  For desserts try to have seasonal fruits, berries, nuts, cheeses or dark chocolate with more than 70% cacao. 2.  Do not snack 3.  You do not have to eat breakfast.  If you choose to have breakfast-eat plain greek yogurt, eggs, oatmeal (without sugar) 4.  Drink water, freshly brewed unsweetened tea (green, black or herbal) or coffee.  Do not drink sodas including  diet sodas , juices, beverages sweetened with artificial sweeteners. 5.  Reduce your consumption of refined grains. 6.  Avoid protein drinks such as Optifast, Slim fast etc. Eat chicken, fish, meat, dairy and beans for your sources of protein 7.  Natural unprocessed fats like cold pressed virgin olive oil, butter, coconut oil are good for you.  Eat avocados 8.  Increase your consumption of fiber.  Fruits, berries, vegetables, whole grains, flaxseeds, Chia seeds, beans, popcorn, nuts, oatmeal are good sources of fiber 9.  Use vinegar in your diet, i.e. apple cider vinegar, red wine or balsamic vinegar 10.  You can try fasting.  For  example you can skip breakfast and lunch every other day (24-hour fast) 11.  Stress reduction, good night sleep, relaxation, meditation, yoga and other physical activity is likely to help you to maintain low weight too. 12.  If you drink alcohol, limit your alcohol intake to no more than 2 drinks a day.   Mediterranean diet is good for you. ([employer] has a typical Mediterranean cuisine menu) The Mediterranean diet is a way of eating based on the traditional cuisine of countries bordering the [employer]. While there is no single definition of the Mediterranean diet, it is typically high in vegetables, fruits, whole grains, beans, nut and seeds, and olive oil. The main components of Mediterranean diet include: [name] Daily consumption of vegetables, fruits, whole grains and healthy fats  . Weekly intake of fish, poultry, beans and eggs  . Moderate portions of dairy products  . Limited intake of red meat Other important elements of the Mediterranean diet are sharing meals with family and friends, enjoying a glass of red wine and being physically active. Health benefits of a Mediterranean diet: A traditional Mediterranean diet consisting of large quantities of fresh fruits and vegetables, nuts, fish and olive oil-coupled with physical activity-can reduce your risk of serious mental and physical health problems by: Preventing heart disease and strokes. Following a Mediterranean diet limits your intake of refined breads, processed foods, and red meat, and encourages drinking red wine instead of hard liquor-all factors that can help prevent heart disease and stroke. Keeping you agile. If you're an older adult, the nutrients gained with a Mediterranean diet may reduce your risk of developing muscle weakness and other signs of frailty by about 70 percent. Reducing the risk of Alzheimer's. Research suggests that the Barnes diet may improve cholesterol, blood sugar levels, and overall blood  vessel health, which in turn may reduce your risk of Alzheimer's disease or dementia. Halving the risk of Parkinson's disease. The high levels of antioxidants in the Mediterranean diet can prevent cells from undergoing a damaging process called oxidative stress, thereby cutting the risk of Parkinson's disease in half. Increasing longevity. By reducing your risk of developing heart disease or cancer with the Mediterranean diet, you're reducing your risk of death at any age by 20%. Protecting against type 2 diabetes. A Mediterranean diet is rich in fiber which digests slowly, prevents huge swings in blood sugar, and can help you maintain a healthy weight.    Cabbage soup recipe that will not make you gain weight: Take 1 small head of cabbage, 1 average pack of celery, 4 green peppers, 4 onions, 2 cans diced tomatoes (they are not available without salt), salt and spices to taste.  Chop cabbage, celery, peppers and onions.  And tomatoes and 2-2.5 liters (2.5 quarts) of water so that it would just cover the vegetables.  Bring to boil.  Add spices and salt.  Turn heat to low/medium and simmer for 20-25 minutes.  Naturally, you can make a smaller batch and change some of the ingredients.

## 2019-05-07 NOTE — Progress Notes (Signed)
Subjective:  Patient ID: Sierra Johnson, female    DOB: Aug 15, 1950  Age: 68 y.o. MRN: [id number]  CC: No chief complaint on file.   HPI Sierra Johnson presents for a welcome to [facility] exam  Outpatient Medications Prior to Visit  Medication Sig Dispense Refill  . Accu-Chek FastClix Lancets MISC USE TO CHECK BLOOD SUGARS TWICE A DAY 102 each 0  . albuterol (VENTOLIN HFA) 108 (90 Base) MCG/ACT inhaler Inhale 1-2 puffs into the lungs every 6 (six) hours as needed for wheezing or shortness of breath. 8 g 0  . apixaban (ELIQUIS) 2.5 MG TABS tablet Take 1 tablet (2.5 mg total) by mouth 2 (two) times daily. 180 tablet 3  . Ascorbic Acid (VITAMIN C) 1000 MG tablet Take 1,000 mg by mouth daily with lunch.     [name] BLACK CURRANT SEED OIL PO Take 1,250 mg by mouth daily after lunch.     . Blood Glucose Monitoring Suppl (ACCU-CHEK GUIDE) w/Device KIT 1 each by Does not apply route 2 (two) times daily. DX: E11.9 1 kit 0  . CALCIUM PO Take 1,000 mg by mouth daily after lunch.     . Cholecalciferol (VITAMIN D3) 1000 UNITS CAPS Take 1,000 Units by mouth daily after lunch.     . ECHINACEA PO Take 1,200 mg by mouth daily after lunch.     . ELDERBERRY PO Take 5-10 mLs by mouth daily after lunch.     . Flaxseed, Linseed, (FLAX SEED OIL PO) Take 1,250 mg by mouth daily after lunch.    . fluticasone (FLONASE) 50 MCG/ACT nasal spray Place 2 sprays into both nostrils daily. (Patient taking differently: Place 2 sprays into both nostrils daily as needed for allergies. ) 16 g 6  . gabapentin (NEURONTIN) 300 MG capsule Take 1 capsule (300 mg total) by mouth 3 (three) times daily. Take 300 mg TID x 2 weeks, then 300 mg BID x 2 weeks, then 300 mg QD x 2 weeks 84 capsule 0  . glucose blood (ACCU-CHEK GUIDE) test strip UUSE TO CHECK BLOOD SUGARS TWICE A DAY 200 each 3  . glucose monitoring kit (FREESTYLE) monitoring kit 1 each by Does not apply route 2 (two) times daily. Dx: E11.9, I10 1 each 0  . hydrochlorothiazide  (MICROZIDE) 12.5 MG capsule Take 1 capsule by mouth once daily 30 capsule 1  . ipratropium-albuterol (DUONEB) 0.5-2.5 (3) MG/3ML SOLN Take 3 mLs by nebulization every 6 (six) hours as needed. (Patient taking differently: Take 3 mLs by nebulization every 6 (six) hours as needed (shortness of breath). ) 360 mL 1  . loratadine (CLARITIN) 10 MG tablet Take 1 tablet (10 mg total) by mouth daily. (Patient taking differently: Take 10 mg by mouth daily as needed for allergies. ) 30 tablet 11  . Magnesium 400 MG TABS Take 400 mg by mouth daily after lunch.     . metFORMIN (GLUCOPHAGE) 500 MG tablet TAKE 1 TABLET BY MOUTH 3 TIMES DAILY (Patient taking differently: Take 500 mg by mouth 2 (two) times daily with a meal. ) 270 tablet 3  . olmesartan (BENICAR) 20 MG tablet Take 1 tablet (20 mg total) by mouth daily. 90 tablet 3  . Omega-3 1000 MG CAPS Take 1,000 mg by mouth daily after lunch.    . Respiratory Therapy Supplies (NEBULIZER/TUBING/MOUTHPIECE) KIT every 4-6 hours 1 each 3  . TOUJEO SOLOSTAR 300 UNIT/ML SOPN INJECT 18 UNITS INTO THE SKIN EVERY MORNING. (Patient taking differently: 18 Units by Subconjunctival  route daily as needed (for blood sugars greater than 120). ) 13.5 pen 9  . traMADol (ULTRAM) 50 MG tablet Take 1-2 tablets (50-100 mg total) by mouth every 6 (six) hours as needed for moderate pain. 40 tablet 0  . vitamin B-12 (CYANOCOBALAMIN) 500 MCG tablet Take 500 mcg by mouth daily after lunch.      No facility-administered medications prior to visit.     ROS: Review of Systems  Constitutional: Positive for unexpected weight change. Negative for activity change, appetite change, chills and fatigue.  HENT: Negative for congestion, mouth sores and sinus pressure.   Eyes: Negative for visual disturbance.  Respiratory: Negative for cough and chest tightness.   Gastrointestinal: Negative for abdominal pain and nausea.  Genitourinary: Negative for difficulty urinating, frequency and vaginal pain.   Musculoskeletal: Negative for back pain and gait problem.  Skin: Negative for pallor and rash.  Neurological: Negative for dizziness, tremors, weakness, numbness and headaches.  Psychiatric/Behavioral: Negative for confusion, sleep disturbance and suicidal ideas.    Objective:  BP 136/86 (BP Location: Left Arm, Patient Position: Sitting, Cuff Size: Large)   Pulse 74   Temp 98.6 F (37 C) (Oral)   Ht 5' (1.524 m)   Wt 205 lb (93 kg)   SpO2 97%   BMI 40.04 kg/m   BP Readings from Last 3 Encounters:  05/07/19 136/86  03/27/19 (!) 166/98  01/28/19 (!) 142/85    Wt Readings from Last 3 Encounters:  05/07/19 205 lb (93 kg)  03/27/19 201 lb (91.2 kg)  01/27/19 198 lb (89.8 kg)    Physical Exam Constitutional:      General: She is not in acute distress.    Appearance: She is well-developed. She is obese.  HENT:     Head: Normocephalic.     Right Ear: External ear normal.     Left Ear: External ear normal.     Nose: Nose normal.  Eyes:     General:        Right eye: No discharge.        Left eye: No discharge.     Conjunctiva/sclera: Conjunctivae normal.     Pupils: Pupils are equal, round, and reactive to light.  Neck:     Musculoskeletal: Normal range of motion and neck supple.     Thyroid: No thyromegaly.     Vascular: No JVD.     Trachea: No tracheal deviation.  Cardiovascular:     Rate and Rhythm: Normal rate and regular rhythm.     Heart sounds: Normal heart sounds.  Pulmonary:     Effort: No respiratory distress.     Breath sounds: No stridor. No wheezing.  Abdominal:     General: Bowel sounds are normal. There is no distension.     Palpations: Abdomen is soft. There is no mass.     Tenderness: There is no abdominal tenderness. There is no guarding or rebound.  Musculoskeletal:        General: No tenderness.  Lymphadenopathy:     Cervical: No cervical adenopathy.  Skin:    Findings: No erythema or rash.  Neurological:     Cranial Nerves: No cranial  nerve deficit.     Motor: No abnormal muscle tone.     Coordination: Coordination normal.     Deep Tendon Reflexes: Reflexes normal.  Psychiatric:        Behavior: Behavior normal.        Thought Content: Thought content normal.  Judgment: Judgment normal.     Lab Results  Component Value Date   WBC 8.7 04/29/2019   HGB 12.1 04/29/2019   HCT 37.1 04/29/2019   PLT 335.0 04/29/2019   GLUCOSE 106 (H) 04/29/2019   CHOL 254 (H) 04/29/2019   TRIG 84.0 04/29/2019   HDL 73.30 04/29/2019   LDLDIRECT 119.5 08/27/2012   LDLCALC 163 (H) 04/29/2019   ALT 13 04/29/2019   AST 14 04/29/2019   NA 135 04/29/2019   K 4.5 04/29/2019   CL 101 04/29/2019   CREATININE 1.02 04/29/2019   BUN 16 04/29/2019   CO2 26 04/29/2019   TSH 2.21 04/29/2019   INR 1.0 01/23/2019   HGBA1C 6.6 (H) 04/29/2019   MICROALBUR 22.1 (H) 04/29/2019    No results found.  Assessment & Plan:   There are no diagnoses linked to this encounter.   No orders of the defined types were placed in this encounter.    Follow-up: No follow-ups on file.  Walker Kehr, MD

## 2019-05-08 NOTE — Addendum Note (Signed)
Addended by: Karren Cobble on: 05/08/2019 09:18 AM   Modules accepted: Orders

## 2019-05-29 ENCOUNTER — Inpatient Hospital Stay: Payer: Medicare Other | Attending: Hematology | Admitting: Hematology

## 2019-05-29 ENCOUNTER — Inpatient Hospital Stay: Payer: Medicare Other

## 2019-05-29 ENCOUNTER — Encounter: Payer: Self-pay | Admitting: Hematology

## 2019-05-29 ENCOUNTER — Other Ambulatory Visit: Payer: Self-pay

## 2019-05-29 VITALS — BP 149/73 | HR 62 | Temp 97.1°F | Resp 18 | Ht 60.0 in | Wt 207.1 lb

## 2019-05-29 DIAGNOSIS — Z79899 Other long term (current) drug therapy: Secondary | ICD-10-CM | POA: Insufficient documentation

## 2019-05-29 DIAGNOSIS — I2699 Other pulmonary embolism without acute cor pulmonale: Secondary | ICD-10-CM | POA: Diagnosis not present

## 2019-05-29 DIAGNOSIS — E785 Hyperlipidemia, unspecified: Secondary | ICD-10-CM | POA: Insufficient documentation

## 2019-05-29 DIAGNOSIS — D649 Anemia, unspecified: Secondary | ICD-10-CM | POA: Insufficient documentation

## 2019-05-29 DIAGNOSIS — Z86718 Personal history of other venous thrombosis and embolism: Secondary | ICD-10-CM | POA: Diagnosis present

## 2019-05-29 DIAGNOSIS — R05 Cough: Secondary | ICD-10-CM | POA: Insufficient documentation

## 2019-05-29 DIAGNOSIS — Z86711 Personal history of pulmonary embolism: Secondary | ICD-10-CM | POA: Diagnosis not present

## 2019-05-29 DIAGNOSIS — R062 Wheezing: Secondary | ICD-10-CM | POA: Diagnosis not present

## 2019-05-29 DIAGNOSIS — Z7901 Long term (current) use of anticoagulants: Secondary | ICD-10-CM | POA: Insufficient documentation

## 2019-05-29 DIAGNOSIS — E119 Type 2 diabetes mellitus without complications: Secondary | ICD-10-CM | POA: Insufficient documentation

## 2019-05-29 DIAGNOSIS — Z794 Long term (current) use of insulin: Secondary | ICD-10-CM | POA: Insufficient documentation

## 2019-05-29 LAB — CMP (CANCER CENTER ONLY)
ALT: 10 U/L (ref 0–44)
AST: 14 U/L — ABNORMAL LOW (ref 15–41)
Albumin: 4.3 g/dL (ref 3.5–5.0)
Alkaline Phosphatase: 70 U/L (ref 38–126)
Anion gap: 7 (ref 5–15)
BUN: 16 mg/dL (ref 8–23)
CO2: 26 mmol/L (ref 22–32)
Calcium: 9.8 mg/dL (ref 8.9–10.3)
Chloride: 104 mmol/L (ref 98–111)
Creatinine: 1.1 mg/dL — ABNORMAL HIGH (ref 0.44–1.00)
GFR, Est AFR Am: >60 mL/min (ref >60)
GFR, Estimated: 52 mL/min — ABNORMAL LOW (ref >60)
Glucose, Bld: 104 mg/dL — ABNORMAL HIGH (ref 70–99)
Potassium: 4.2 mmol/L (ref 3.5–5.1)
Sodium: 137 mmol/L (ref 135–145)
Total Bilirubin: 0.4 mg/dL (ref 0.3–1.2)
Total Protein: 7.9 g/dL (ref 6.5–8.1)

## 2019-05-29 LAB — CBC WITH DIFFERENTIAL (CANCER CENTER ONLY)
Abs Immature Granulocytes: 0.02 10*3/uL (ref 0.00–0.07)
Basophils Absolute: 0.1 10*3/uL (ref 0.0–0.1)
Basophils Relative: 1 %
Eosinophils Absolute: 0.2 10*3/uL (ref 0.0–0.5)
Eosinophils Relative: 2 %
HCT: 36.2 % (ref 36.0–46.0)
Hemoglobin: 11.7 g/dL — ABNORMAL LOW (ref 12.0–15.0)
Immature Granulocytes: 0 %
Lymphocytes Relative: 36 %
Lymphs Abs: 3 10*3/uL (ref 0.7–4.0)
MCH: 27.2 pg (ref 26.0–34.0)
MCHC: 32.3 g/dL (ref 30.0–36.0)
MCV: 84.2 fL (ref 80.0–100.0)
Monocytes Absolute: 0.6 10*3/uL (ref 0.1–1.0)
Monocytes Relative: 7 %
Neutro Abs: 4.5 10*3/uL (ref 1.7–7.7)
Neutrophils Relative %: 54 %
Platelet Count: 306 10*3/uL (ref 150–400)
RBC: 4.3 MIL/uL (ref 3.87–5.11)
RDW: 16.7 % — ABNORMAL HIGH (ref 11.5–15.5)
WBC Count: 8.4 10*3/uL (ref 4.0–10.5)
nRBC: 0 % (ref 0.0–0.2)

## 2019-05-29 NOTE — Progress Notes (Signed)
[facility] [facility] OFFICE PROGRESS NOTE  Patient Care Team: Plotnikov, [name], MD as PCP - Gaston Islam, MD as Consulting Physician (Orthopedic Surgery) [name], MD as Consulting Physician (Pulmonary Disease) Tish Men, MD as Consulting Physician (Hematology)  HEME/ONC OVERVIEW: 1. Unprovoked PTE of the RUL -02/2018: CTA chest for cough showed a RUL PTE; doppler negative for DVT in lower extremities -02/2018 - present: Eliquis, currently on 2.46m BID for secondary ppx   TREATMENT REGIMEN:  Early 02/2018 - 08/2018: Eliquis 564mBID   08/2018 - present: Eliquis 2.6m16mID for seondary ppx   ASSESSMENT & PLAN:   Unprovoked PTE of the RUL -Patient is tolerating Eliquis 2.6mg40mD well without any abnormal bleeding or bruising -Goal of anticoagulation is lifelong, given unprovoked VTE -Due to the high out-of-pocket cost, patient applied for medication assistance, but unfortunately did not qualify -I discussed with the patient the rationale for anticoagulation, as well as some of the potential risks of stopping the medication, including recurrent VTE -If the patient is able to continue to afford her medication, then my recommendation would be to continue anticoagulation indefinitely; however, if the high cost is causing significant burden on her, such as difficulty affording other medications or paying for food for rent, then we would have to weigh the benefits and risks of anticoagulation -If she decides to stop anticoagulation, I would recommend adding aspirin 326mg64mly for VTE prophylaxis -After lengthy discussion, patient expressed her desire to continue Eliquis for now, but will contact us ifKoreahe experiences hardship with affording the medication -I reinforced the importance of preventive strategies such as avoiding hormonal supplement, avoiding cigarette smoking, keeping up-to-date with screening programs for early cancer detection, frequent ambulation for  long distance travel and aggressive DVT prophylaxis in all surgical settings. -Should she need any interruption of the anticoagulation for elective procedures in the future, feel free to contact me regarding peri-operative management.  Normocytic anemia -Likely multifactorial, including mild iron deficiency ane possibly underlying renal dysfunction -EGD and colonoscopy in 11/2018 showed no evidence of bleeding -Hgb 11.8 today, stable  -I recommend the patient to start daily oral iron supplement, and counseled her on some of the side effects of iron supplement, including constipation -If anemia worsens in the future, we can consider further work-up  Cough -Exam notable for mild end expiratory wheezing -Patient denies any hx of tobacco use -She is currently on Duoneb -I encouraged patient to follow-up with his PCP for further management of airway disease  No orders of the defined types were placed in this encounter.  All questions were answered. The patient knows to call the clinic with any problems, questions or concerns. No barriers to learning was detected.  Return in 6 months for labs and clinic follow-up.  Kesa Birky ZTish Men12/03/2019 10:21 AM  CHIEF COMPLAINT: "I am doing okay"  INTERVAL HISTORY: Ms. FostePresasrns to clinic for follow-up of history of unprovoked PTE on Eliquis.  She reports that she has been tolerating Eliquis well without any abnormal bleeding or bruising.  She applied for medication assistance for Eliquis, but did not qualify.  She currently pays $386 for 3-mon69-monthy of Eliquis, which is causing some financial difficulty for her.  She otherwise is doing well, and denies any other complaint.  REVIEW OF SYSTEMS:   Constitutional: ( - ) fevers, ( - )  chills , ( - ) night sweats Eyes: ( - ) blurriness of vision, ( - ) double vision, ( - )  watery eyes Ears, nose, mouth, throat, and face: ( - ) mucositis, ( - ) sore throat Respiratory: ( - ) cough, ( - ) dyspnea,  ( - ) wheezes Cardiovascular: ( - ) palpitation, ( - ) chest discomfort, ( - ) lower extremity swelling Gastrointestinal:  ( - ) nausea, ( - ) heartburn, ( - ) change in bowel habits Skin: ( - ) abnormal skin rashes Lymphatics: ( - ) new lymphadenopathy, ( - ) easy bruising Neurological: ( - ) numbness, ( - ) tingling, ( - ) new weaknesses Behavioral/Psych: ( - ) mood change, ( - ) new changes  All other systems were reviewed with the patient and are negative.   I have reviewed the past medical history, past surgical history, social history and family history with the patient and they are unchanged from previous note.  ALLERGIES:  is allergic to benazepril; amlodipine; and tetracycline.  MEDICATIONS:  Current Outpatient Medications  Medication Sig Dispense Refill  . Accu-Chek FastClix Lancets MISC USE TO CHECK BLOOD SUGARS TWICE A DAY 102 each 0  . albuterol (VENTOLIN HFA) 108 (90 Base) MCG/ACT inhaler Inhale 1-2 puffs into the lungs every 6 (six) hours as needed for wheezing or shortness of breath. 8 g 0  . apixaban (ELIQUIS) 2.5 MG TABS tablet Take 1 tablet (2.5 mg total) by mouth 2 (two) times daily. 180 tablet 3  . Ascorbic Acid (VITAMIN C) 1000 MG tablet Take 1,000 mg by mouth daily with lunch.     [name] BLACK CURRANT SEED OIL PO Take 1,250 mg by mouth daily after lunch.     . Blood Glucose Monitoring Suppl (ACCU-CHEK GUIDE) w/Device KIT 1 each by Does not apply route 2 (two) times daily. DX: E11.9 1 kit 0  . Cholecalciferol (VITAMIN D3) 1000 UNITS CAPS Take 1,000 Units by mouth daily after lunch.     . ECHINACEA PO Take 1,200 mg by mouth daily after lunch.     . ELDERBERRY PO Take 5-10 mLs by mouth daily after lunch.     . Flaxseed, Linseed, (FLAX SEED OIL PO) Take 1,250 mg by mouth daily after lunch.    . fluticasone (FLONASE) 50 MCG/ACT nasal spray Place 2 sprays into both nostrils daily. (Patient taking differently: Place 2 sprays into both nostrils daily as needed for allergies. ) 16  g 6  . gabapentin (NEURONTIN) 300 MG capsule Take 1 capsule (300 mg total) by mouth 3 (three) times daily. Take 300 mg TID x 2 weeks, then 300 mg BID x 2 weeks, then 300 mg QD x 2 weeks 84 capsule 0  . glucose blood (ACCU-CHEK GUIDE) test strip UUSE TO CHECK BLOOD SUGARS TWICE A DAY 200 each 3  . hydrochlorothiazide (MICROZIDE) 12.5 MG capsule Take 1 capsule by mouth once daily 30 capsule 1  . Insulin Glargine, 1 Unit Dial, (TOUJEO SOLOSTAR) 300 UNIT/ML SOPN 18 Units by Subconjunctival route daily as needed (for blood sugars greater than 120). 3 pen 3  . ipratropium-albuterol (DUONEB) 0.5-2.5 (3) MG/3ML SOLN Take 3 mLs by nebulization every 6 (six) hours as needed. (Patient taking differently: Take 3 mLs by nebulization every 6 (six) hours as needed (shortness of breath). ) 360 mL 1  . loratadine (CLARITIN) 10 MG tablet Take 1 tablet (10 mg total) by mouth daily. 90 tablet 3  . Magnesium 400 MG TABS Take 400 mg by mouth daily after lunch.     . metFORMIN (GLUCOPHAGE) 500 MG tablet Take 1 tablet (500 mg total)  by mouth 2 (two) times daily with a meal. 180 tablet 3  . olmesartan (BENICAR) 20 MG tablet Take 1 tablet (20 mg total) by mouth daily. 90 tablet 3  . Omega-3 1000 MG CAPS Take 1,000 mg by mouth daily after lunch.    . pravastatin (PRAVACHOL) 20 MG tablet Take 1 tablet (20 mg total) by mouth daily. 90 tablet 3  . Respiratory Therapy Supplies (NEBULIZER/TUBING/MOUTHPIECE) KIT every 4-6 hours 1 each 3  . traMADol (ULTRAM) 50 MG tablet Take 1-2 tablets (50-100 mg total) by mouth every 6 (six) hours as needed for moderate pain. 40 tablet 0  . vitamin B-12 (CYANOCOBALAMIN) 500 MCG tablet Take 500 mcg by mouth daily after lunch.     [name] CALCIUM PO Take 1,000 mg by mouth daily after lunch.      No current facility-administered medications for this visit.    PHYSICAL EXAMINATION: ECOG PERFORMANCE STATUS: 1 - Symptomatic but completely ambulatory  Today's Vitals   05/29/19 1013  BP: (!) 149/73   Pulse: 62  Resp: 18  Temp: (!) 97.1 F (36.2 C)  TempSrc: Temporal  SpO2: 100%  Weight: 207 lb 1.9 oz (93.9 kg)  Height: 5' (1.524 m)  PainSc: 0-No pain   Body mass index is 40.45 kg/m.  Filed Weights   05/29/19 1013  Weight: 207 lb 1.9 oz (93.9 kg)    GENERAL: alert, no distress and comfortable SKIN: skin color, texture, turgor are normal, no rashes or significant lesions EYES: conjunctiva are pink and non-injected, sclera clear OROPHARYNX: no exudate, no erythema; lips, buccal mucosa, and tongue normal  NECK: supple, non-tender LUNGS: clear to auscultation with normal breathing effort HEART: regular rate & rhythm and no murmurs and no lower extremity edema ABDOMEN: soft, non-tender, non-distended, normal bowel sounds Musculoskeletal: no cyanosis of digits and no clubbing  PSYCH: alert & oriented x 3, fluent speech  LABORATORY DATA:  I have reviewed the data as listed    Component Value Date/Time   NA 137 05/29/2019 0926   K 4.2 05/29/2019 0926   CL 104 05/29/2019 0926   CO2 26 05/29/2019 0926   GLUCOSE 104 (H) 05/29/2019 0926   GLUCOSE 74 05/17/2006 0957   BUN 16 05/29/2019 0926   CREATININE 1.10 (H) 05/29/2019 0926   CALCIUM 9.8 05/29/2019 0926   PROT 7.9 05/29/2019 0926   ALBUMIN 4.3 05/29/2019 0926   AST 14 (L) 05/29/2019 0926   ALT 10 05/29/2019 0926   ALKPHOS 70 05/29/2019 0926   BILITOT 0.4 05/29/2019 0926   GFRNONAA 52 (L) 05/29/2019 0926   GFRAA >60 05/29/2019 0926    No results found for: SPEP, UPEP  Lab Results  Component Value Date   WBC 8.4 05/29/2019   NEUTROABS 4.5 05/29/2019   HGB 11.7 (L) 05/29/2019   HCT 36.2 05/29/2019   MCV 84.2 05/29/2019   PLT 306 05/29/2019      Chemistry      Component Value Date/Time   NA 137 05/29/2019 0926   K 4.2 05/29/2019 0926   CL 104 05/29/2019 0926   CO2 26 05/29/2019 0926   BUN 16 05/29/2019 0926   CREATININE 1.10 (H) 05/29/2019 0926      Component Value Date/Time   CALCIUM 9.8 05/29/2019  0926   ALKPHOS 70 05/29/2019 0926   AST 14 (L) 05/29/2019 0926   ALT 10 05/29/2019 0926   BILITOT 0.4 05/29/2019 0926       RADIOGRAPHIC STUDIES: I have personally reviewed the radiological images as  listed below and agreed with the findings in the report. No results found.

## 2019-05-30 ENCOUNTER — Telehealth: Payer: Self-pay | Admitting: *Deleted

## 2019-05-30 ENCOUNTER — Telehealth: Payer: Self-pay | Admitting: Internal Medicine

## 2019-05-30 NOTE — Telephone Encounter (Signed)
-----   Message from Tish Men, MD sent at 05/29/2019 10:28 AM EST ----- Sierra Johnson,  Can you let [name] know that her Cr is slightly elevated (Cr 1.1), and she should maintain adequate hydration?Thanks.  [place]

## 2019-05-30 NOTE — Telephone Encounter (Signed)
As noted below by Dr. Maylon Peppers, I informed the patient of her Creatinine level. She stated,"I drink about 20 ounces a day. Instructed her to increase water intake to maintain adequate hydration. She verbalized understanding.

## 2019-05-30 NOTE — Telephone Encounter (Signed)
Patient called and would like to talk to Dr. Billey Chang or his CMA about getting an inhaler for her. Please call patient back, thanks.

## 2019-05-30 NOTE — Telephone Encounter (Signed)
pt notified to contact pulmonology

## 2019-08-26 ENCOUNTER — Other Ambulatory Visit: Payer: Self-pay

## 2019-08-26 MED ORDER — ACCU-CHEK GUIDE VI STRP: ORAL_STRIP | 3 refills | Status: DC

## 2019-10-07 DIAGNOSIS — M1990 Unspecified osteoarthritis, unspecified site: Secondary | ICD-10-CM | POA: Insufficient documentation

## 2019-10-07 DIAGNOSIS — E119 Type 2 diabetes mellitus without complications: Secondary | ICD-10-CM | POA: Insufficient documentation

## 2019-10-09 DIAGNOSIS — M8588 Other specified disorders of bone density and structure, other site: Secondary | ICD-10-CM | POA: Diagnosis not present

## 2019-10-09 DIAGNOSIS — N952 Postmenopausal atrophic vaginitis: Secondary | ICD-10-CM | POA: Diagnosis not present

## 2019-10-09 DIAGNOSIS — Z6841 Body Mass Index (BMI) 40.0 and over, adult: Secondary | ICD-10-CM | POA: Diagnosis not present

## 2019-10-09 DIAGNOSIS — Z124 Encounter for screening for malignant neoplasm of cervix: Secondary | ICD-10-CM | POA: Diagnosis not present

## 2019-10-09 DIAGNOSIS — Z1231 Encounter for screening mammogram for malignant neoplasm of breast: Secondary | ICD-10-CM | POA: Diagnosis not present

## 2019-10-09 DIAGNOSIS — Z01419 Encounter for gynecological examination (general) (routine) without abnormal findings: Secondary | ICD-10-CM | POA: Diagnosis not present

## 2019-10-09 DIAGNOSIS — N958 Other specified menopausal and perimenopausal disorders: Secondary | ICD-10-CM | POA: Diagnosis not present

## 2019-10-27 ENCOUNTER — Telehealth: Payer: Self-pay | Admitting: Hematology

## 2019-10-27 NOTE — Telephone Encounter (Signed)
I was asked by Sierra Johnson [facility] to call patient asking if they wished to continue treatment here or transfer to [facility] w/ Dr Alvy Bimler.  Patient wised to transfer care .  In Basket message was sent to Dr Alvy Bimler for scheduling.

## 2019-10-28 ENCOUNTER — Telehealth: Payer: Self-pay | Admitting: Hematology and Oncology

## 2019-10-28 NOTE — Telephone Encounter (Signed)
Scheduled appt per 5/11 staff message - left message for patient with appt date and time for lab and f/u early nxt month.

## 2019-11-03 ENCOUNTER — Encounter: Payer: Self-pay | Admitting: Pulmonary Disease

## 2019-11-03 ENCOUNTER — Other Ambulatory Visit: Payer: Self-pay

## 2019-11-03 ENCOUNTER — Ambulatory Visit (INDEPENDENT_AMBULATORY_CARE_PROVIDER_SITE_OTHER): Payer: Medicare Other | Admitting: Pulmonary Disease

## 2019-11-03 DIAGNOSIS — G4733 Obstructive sleep apnea (adult) (pediatric): Secondary | ICD-10-CM

## 2019-11-03 DIAGNOSIS — R0602 Shortness of breath: Secondary | ICD-10-CM | POA: Diagnosis not present

## 2019-11-03 DIAGNOSIS — R05 Cough: Secondary | ICD-10-CM

## 2019-11-03 DIAGNOSIS — R059 Cough, unspecified: Secondary | ICD-10-CM

## 2019-11-03 MED ORDER — CHLORPHENIRAMINE MALEATE 4 MG PO TABS: 4.0000 mg | ORAL_TABLET | Freq: Every day | ORAL | Status: DC

## 2019-11-03 MED ORDER — ALBUTEROL SULFATE HFA 108 (90 BASE) MCG/ACT IN AERS
2.0000 | INHALATION_SPRAY | Freq: Four times a day (QID) | RESPIRATORY_TRACT | 0 refills | Status: DC | PRN
Start: 2019-11-03 — End: 2020-12-09

## 2019-11-03 NOTE — Patient Instructions (Signed)
Trial of albuterol MDI 2 puffs as needed for wheezing.  Schedule high-resolution CT chest for bronchiectasis Based on this we will decide about bronchoscopy, we discussed risks and benefits of procedure.  Meantime, for cough, trial of chlorpheniramine 4 mg at bedtime for 3 to 4 weeks [facility] brand phenylephrine 10 mg in the daytime for 2 weeks  Let me know if this improves your cough Okay to take OTC cough medicine such as Robitussin

## 2019-11-03 NOTE — Progress Notes (Signed)
   Subjective:    Patient ID: Sierra Johnson, female    DOB: September 27, 1950, 69 y.o.   MRN: [id number]  HPI  69 year old woman for follow-up of OSA and unprovoked PE 02/2018  Reports chronic cough since 2019, she went to pulmonologist at [facility] Dr. Mariana Arn, reviewed his consult in detail -treated for allergic rhinitis and Nexium and then recommended gabapentin. CT chest 03/2019 showed new tree-in-bud infiltrates and groundglass opacities in lingula with bilateral lower lobe atelectasis and scarring and bronchiectasis Bronchiectasis recommended but then did not follow through because of the Covid pandemic  She continues to report cough 90% of the time productive of yellow to clear sputum, worsens when she lies down at night, no obvious reflux symptoms or heartburn. She has perennial postnasal drip    Significant tests/ events reviewed PFTs 03/2019 wnl   02/2018 CT angiogram chest showed pulmonary embolism in the right upper lobe.  There was also bibasilar airspace disease and radiologist favored atelectasis or consolidation left worse than right.     Past Medical History:  Diagnosis Date  . ANEMIA-NOS 04/24/2007  . Arthritis   . DIABETES MELLITUS, TYPE II 04/24/2007  . Elevated glucose 2011  . HYPERLIPIDEMIA 07/17/2007   mild  . HYPERTENSION 04/24/2007  . Menopause 1998   Dr. Gaetano Net  . OBSTRUCTIVE SLEEP APNEA 09/30/2009   on CPAP 2011  . Pneumonia    chronic Brochitis and use inhaler  . VITAMIN B12 DEFICIENCY 04/24/2007     Review of Systems Patient denies significant dyspnea,cough, hemoptysis,  chest pain, palpitations, pedal edema, orthopnea, paroxysmal nocturnal dyspnea, lightheadedness, nausea, vomiting, abdominal or  leg pains      Objective:   Physical Exam   Gen. Pleasant, obese, in no distress ENT - no lesions, no post nasal drip Neck: No JVD, no thyromegaly, no carotid bruits Lungs: no use of accessory muscles, no dullness to percussion, decreased without  rales or rhonchi  Cardiovascular: Rhythm regular, heart sounds  normal, no murmurs or gallops, no peripheral edema Musculoskeletal: No deformities, no cyanosis or clubbing , no tremors        Assessment & Plan:

## 2019-11-03 NOTE — Assessment & Plan Note (Addendum)
Trial of albuterol MDI 2 puffs as needed for wheezing.  Schedule high-resolution CT chest for bronchiectasis .  Concern here is for area of bronchiectasis in lingula and persistent left lower lobe infiltrate noted on prior imaging causing chronic cough. Based on this we will decide about bronchoscopy, we discussed risks and benefits of procedure.  Meantime, for cough, trial of chlorpheniramine 4 mg at bedtime for 3 to 4 weeks [facility] brand phenylephrine 10 mg in the daytime for 2 weeks  Let me know if this improves your cough Okay to take OTC cough medicine such as Robitussin She has been treated in the past for GERD and cough variant asthma without much improvement

## 2019-11-04 NOTE — Assessment & Plan Note (Signed)
CPAP compliance was reviewed, she is very compliant no leak in current pressure setting seems optimal  Weight loss encouraged, compliance with goal of at least 4-6 hrs every night is the expectation. Advised against medications with sedative side effects Cautioned against driving when sleepy - understanding that sleepiness will vary on a day to day basis

## 2019-11-18 ENCOUNTER — Ambulatory Visit: Payer: Medicare Other | Admitting: Hematology and Oncology

## 2019-11-18 ENCOUNTER — Other Ambulatory Visit: Payer: Medicare Other

## 2019-11-19 ENCOUNTER — Ambulatory Visit
Admission: RE | Admit: 2019-11-19 | Discharge: 2019-11-19 | Disposition: A | Discharge status: Home / Self Care | Payer: Medicare Other | Source: Ambulatory Visit | Attending: Pulmonary Disease | Admitting: Pulmonary Disease

## 2019-11-19 DIAGNOSIS — R0602 Shortness of breath: Secondary | ICD-10-CM

## 2019-11-19 DIAGNOSIS — J40 Bronchitis, not specified as acute or chronic: Secondary | ICD-10-CM | POA: Diagnosis not present

## 2019-11-27 ENCOUNTER — Other Ambulatory Visit: Payer: Medicare Other

## 2019-11-27 ENCOUNTER — Ambulatory Visit: Payer: Medicare Other | Admitting: Hematology

## 2019-11-27 DIAGNOSIS — Z96651 Presence of right artificial knee joint: Secondary | ICD-10-CM | POA: Diagnosis not present

## 2019-11-27 DIAGNOSIS — M25562 Pain in left knee: Secondary | ICD-10-CM | POA: Diagnosis not present

## 2019-11-27 DIAGNOSIS — Z471 Aftercare following joint replacement surgery: Secondary | ICD-10-CM | POA: Diagnosis not present

## 2019-11-28 ENCOUNTER — Other Ambulatory Visit: Payer: Medicare Other

## 2019-11-28 ENCOUNTER — Encounter: Payer: Self-pay | Admitting: Hematology and Oncology

## 2019-11-28 ENCOUNTER — Inpatient Hospital Stay (HOSPITAL_BASED_OUTPATIENT_CLINIC_OR_DEPARTMENT_OTHER): Payer: Medicare Other | Admitting: Hematology and Oncology

## 2019-11-28 ENCOUNTER — Inpatient Hospital Stay: Payer: Medicare Other | Attending: Hematology and Oncology

## 2019-11-28 ENCOUNTER — Other Ambulatory Visit: Payer: Self-pay

## 2019-11-28 ENCOUNTER — Ambulatory Visit: Payer: Medicare Other | Admitting: Hematology

## 2019-11-28 DIAGNOSIS — I2699 Other pulmonary embolism without acute cor pulmonale: Secondary | ICD-10-CM

## 2019-11-28 DIAGNOSIS — Z7901 Long term (current) use of anticoagulants: Secondary | ICD-10-CM | POA: Diagnosis not present

## 2019-11-28 DIAGNOSIS — N183 Chronic kidney disease, stage 3 unspecified: Secondary | ICD-10-CM | POA: Diagnosis not present

## 2019-11-28 DIAGNOSIS — Z86711 Personal history of pulmonary embolism: Secondary | ICD-10-CM | POA: Insufficient documentation

## 2019-11-28 DIAGNOSIS — M25561 Pain in right knee: Secondary | ICD-10-CM | POA: Insufficient documentation

## 2019-11-28 DIAGNOSIS — J40 Bronchitis, not specified as acute or chronic: Secondary | ICD-10-CM | POA: Diagnosis not present

## 2019-11-28 DIAGNOSIS — E1169 Type 2 diabetes mellitus with other specified complication: Secondary | ICD-10-CM | POA: Diagnosis not present

## 2019-11-28 DIAGNOSIS — I129 Hypertensive chronic kidney disease with stage 1 through stage 4 chronic kidney disease, or unspecified chronic kidney disease: Secondary | ICD-10-CM | POA: Insufficient documentation

## 2019-11-28 DIAGNOSIS — M25562 Pain in left knee: Secondary | ICD-10-CM | POA: Diagnosis not present

## 2019-11-28 DIAGNOSIS — E66813 Obesity, class 3: Secondary | ICD-10-CM

## 2019-11-28 DIAGNOSIS — E669 Obesity, unspecified: Secondary | ICD-10-CM

## 2019-11-28 DIAGNOSIS — E119 Type 2 diabetes mellitus without complications: Secondary | ICD-10-CM | POA: Insufficient documentation

## 2019-11-28 DIAGNOSIS — R0602 Shortness of breath: Secondary | ICD-10-CM | POA: Diagnosis not present

## 2019-11-28 LAB — CBC WITH DIFFERENTIAL (CANCER CENTER ONLY)
Abs Immature Granulocytes: 0.01 10*3/uL (ref 0.00–0.07)
Basophils Absolute: 0.1 10*3/uL (ref 0.0–0.1)
Basophils Relative: 1 %
Eosinophils Absolute: 0.3 10*3/uL (ref 0.0–0.5)
Eosinophils Relative: 4 %
HCT: 38.5 % (ref 36.0–46.0)
Hemoglobin: 12.2 g/dL (ref 12.0–15.0)
Immature Granulocytes: 0 %
Lymphocytes Relative: 34 %
Lymphs Abs: 3.1 10*3/uL (ref 0.7–4.0)
MCH: 28.3 pg (ref 26.0–34.0)
MCHC: 31.7 g/dL (ref 30.0–36.0)
MCV: 89.3 fL (ref 80.0–100.0)
Monocytes Absolute: 0.8 10*3/uL (ref 0.1–1.0)
Monocytes Relative: 9 %
Neutro Abs: 4.8 10*3/uL (ref 1.7–7.7)
Neutrophils Relative %: 52 %
Platelet Count: 264 10*3/uL (ref 150–400)
RBC: 4.31 MIL/uL (ref 3.87–5.11)
RDW: 14.1 % (ref 11.5–15.5)
WBC Count: 9.1 10*3/uL (ref 4.0–10.5)
nRBC: 0 % (ref 0.0–0.2)

## 2019-11-28 LAB — CMP (CANCER CENTER ONLY)
ALT: 12 U/L (ref 0–44)
AST: 16 U/L (ref 15–41)
Albumin: 3.6 g/dL (ref 3.5–5.0)
Alkaline Phosphatase: 80 U/L (ref 38–126)
Anion gap: 11 (ref 5–15)
BUN: 10 mg/dL (ref 8–23)
CO2: 21 mmol/L — ABNORMAL LOW (ref 22–32)
Calcium: 9.4 mg/dL (ref 8.9–10.3)
Chloride: 103 mmol/L (ref 98–111)
Creatinine: 1.19 mg/dL — ABNORMAL HIGH (ref 0.44–1.00)
GFR, Est AFR Am: 55 mL/min — ABNORMAL LOW (ref >60)
GFR, Estimated: 48 mL/min — ABNORMAL LOW (ref >60)
Glucose, Bld: 168 mg/dL — ABNORMAL HIGH (ref 70–99)
Potassium: 5.2 mmol/L — ABNORMAL HIGH (ref 3.5–5.1)
Sodium: 135 mmol/L (ref 135–145)
Total Bilirubin: 0.3 mg/dL (ref 0.3–1.2)
Total Protein: 7.6 g/dL (ref 6.5–8.1)

## 2019-11-28 NOTE — Assessment & Plan Note (Signed)
History of pulmonary embolism appears to be unprovoked The patient have other risk factors that could predispose her to recurrent thromboembolism She is currently on preventative, prophylactic low-dose Eliquis The cost is quite prohibitive We discussed different strategies to get her medications refilled with different pharmacy or to switch her to different brand such as Xarelto For now, she will continue anticoagulation therapy indefinitely

## 2019-11-28 NOTE — Assessment & Plan Note (Signed)
We have extensive discussions about the importance of dietary modification and weight loss The patient appears to be interested to get referral to weight management center

## 2019-11-28 NOTE — Assessment & Plan Note (Signed)
We discussed the importance of dietary modification and weight loss strategies She will continue follow-up with her primary care doctor for further management

## 2019-11-28 NOTE — Assessment & Plan Note (Signed)
She has mild chronic kidney disease likely secondary to dehydration and other risk factors such as diabetes and hypertension We discussed the importance of dietary modification I recommend her to drink more fluid With her current calculated creatinine clearance, there is no contraindication for her to continue on Eliquis

## 2019-11-28 NOTE — Progress Notes (Signed)
[facility] [place] progress notes  Patient Care Team: Plotnikov, [name], MD as PCP - Gaston Islam, MD as Consulting Physician (Orthopedic Surgery) [name], MD as Consulting Physician (Pulmonary Disease) Tish Men, MD as Consulting Physician (Hematology)  CHIEF COMPLAINTS/PURPOSE OF VISIT:  History of unprovoked PE  HISTORY OF PRESENTING ILLNESS:  Sierra Johnson 69 y.o. female was transferred to my care after her prior physician has left.  I reviewed the patient's records extensive and collaborated the history with the patient. Summary of her history is as follows: Summary of Hematological History Patient reports that starting in early August 2019, she began to develop new onset cough and sinus symptoms, associated with low-grade fever, for which she was prescribed 3 different courses of antibiotics without improvement.  In early September 2019, she also developed new onset shortness of breath as well as chest wall soreness from repetitive coughing.  She did not have any lower extremity swelling, warmth, tenderness or erythema.  Her PCP ordered a CT angiogram of the chest, which showed a right upper lobe PTE, for which she was sent to the ER for further management.  While in the hospital, patient was initially started on heparin drip, and the transition to Eliquis at discharge.  She denied any extended travel, being on hormone replacement therapy, or hospitalization/surgery prior to her diagnosis of VTE.  There is no personal or family history of malignancy.  Patient's mother had a blood clot after hip surgery, but there is no other family member with history of VTE. The patient had sedentary lifestyle She has class III obesity with diabetes Between 2019-2021, she is maintained on anticoagulation therapy but after a year of anticoagulation treatment, she is on low-dose preventative maintenance Eliquis  She is retired She is not able to mobilize much due to  bilateral knee pain She struggle a lot with her weight She has several medication adjustments since she was last seen here She denies complications from diabetes such as peripheral neuropathy She was prescribed gabapentin for a while for unrelated reason but is no longer taking it She continues to have occasional cough and shortness of breath The patient denies any recent signs or symptoms of bleeding such as spontaneous epistaxis, hematuria or hematochezia.   MEDICAL HISTORY:  Past Medical History:  Diagnosis Date  . ANEMIA-NOS 04/24/2007  . Arthritis   . DIABETES MELLITUS, TYPE II 04/24/2007  . Elevated glucose 2011  . HYPERLIPIDEMIA 07/17/2007   mild  . HYPERTENSION 04/24/2007  . Menopause 1998   Dr. Gaetano Net  . OBSTRUCTIVE SLEEP APNEA 09/30/2009   on CPAP 2011  . Pneumonia    chronic Brochitis and use inhaler  . VITAMIN B12 DEFICIENCY 04/24/2007    SURGICAL HISTORY: Past Surgical History:  Procedure Laterality Date  . ABDOMINAL HYSTERECTOMY    . HERNIA REPAIR  11/11   Incar. midline hernia  . INCISIONAL HERNIA REPAIR N/A 06/03/2014   Procedure: LAPAROSCOPIC REPAIR RECURRENT  INCISIONAL HERNIA;  Surgeon: Georganna Skeans, MD;  Location: [facility];  Service: General;  Laterality: N/A;  . INSERTION OF MESH N/A 06/03/2014   Procedure: INSERTION OF MESH;  Surgeon: Georganna Skeans, MD;  Location: [facility];  Service: General;  Laterality: N/A;  . TONSILLECTOMY    . TOTAL KNEE ARTHROPLASTY Right 01/27/2019   Procedure: TOTAL KNEE ARTHROPLASTY;  Surgeon: Gaynelle Arabian, MD;  Location: [facility];  Service: Orthopedics;  Laterality: Right;  45mn    SOCIAL HISTORY: Social History   Socioeconomic History  .  Marital status: Married    Spouse name: Not on file  . Number of children: 2  . Years of education: Not on file  . Highest education level: Not on file  Occupational History  . Occupation: retired    Fish farm manager: [employer]  Tobacco Use  . Smoking status: Never Smoker  . Smokeless  tobacco: Never Used  Vaping Use  . Vaping Use: Never used  Substance and Sexual Activity  . Alcohol use: No  . Drug use: No  . Sexual activity: Yes  Other Topics Concern  . Not on file  Social History Narrative   Vegan   Social Determinants of Health   Financial Resource Strain:   . Difficulty of Paying Living Expenses:   Food Insecurity:   . Worried About Charity fundraiser in the Last Year:   . Arboriculturist in the Last Year:   Transportation Needs:   . Film/video editor (Medical):   [name] Lack of Transportation (Non-Medical):   Physical Activity:   . Days of Exercise per Week:   . Minutes of Exercise per Session:   Stress:   . Feeling of Stress :   Social Connections:   . Frequency of Communication with Friends and Family:   . Frequency of Social Gatherings with Friends and Family:   . Attends Religious Services:   . Active Member of Clubs or Organizations:   . Attends Archivist Meetings:   [name] Marital Status:   Intimate Partner Violence:   . Fear of Current or Ex-Partner:   . Emotionally Abused:   [name] Physically Abused:   . Sexually Abused:     FAMILY HISTORY: Family History  Problem Relation Age of Onset  . Heart disease Mother 41       ?CAD  . Lung cancer Father   . Heart disease Brother 48       CAD  . Hypertension Other   . Colon cancer Neg Hx   . Esophageal cancer Neg Hx   . Stomach cancer Neg Hx   . Pancreatic cancer Neg Hx   . Liver disease Neg Hx     ALLERGIES:  is allergic to benazepril, amlodipine, and tetracycline.  MEDICATIONS:  Current Outpatient Medications  Medication Sig Dispense Refill  . Accu-Chek FastClix Lancets MISC USE TO CHECK BLOOD SUGARS TWICE A DAY 102 each 0  . albuterol (VENTOLIN HFA) 108 (90 Base) MCG/ACT inhaler Inhale 1-2 puffs into the lungs every 6 (six) hours as needed for wheezing or shortness of breath. 8 g 0  . albuterol (VENTOLIN HFA) 108 (90 Base) MCG/ACT inhaler Inhale 2 puffs into the lungs every 6  (six) hours as needed for wheezing or shortness of breath. 1 g 0  . apixaban (ELIQUIS) 2.5 MG TABS tablet Take 1 tablet (2.5 mg total) by mouth 2 (two) times daily. 180 tablet 3  . Ascorbic Acid (VITAMIN C) 1000 MG tablet Take 1,000 mg by mouth daily with lunch.     [name] BLACK CURRANT SEED OIL PO Take 1,250 mg by mouth daily after lunch.     . Blood Glucose Monitoring Suppl (ACCU-CHEK GUIDE) w/Device KIT 1 each by Does not apply route 2 (two) times daily. DX: E11.9 1 kit 0  . CALCIUM PO Take 1,000 mg by mouth daily after lunch.     . Cholecalciferol (VITAMIN D3) 1000 UNITS CAPS Take 1,000 Units by mouth daily after lunch.     . ECHINACEA PO Take 1,200 mg  by mouth daily after lunch.     . ELDERBERRY PO Take 5-10 mLs by mouth daily after lunch.     . Flaxseed, Linseed, (FLAX SEED OIL PO) Take 1,250 mg by mouth daily after lunch.    . fluticasone (FLONASE) 50 MCG/ACT nasal spray Place 2 sprays into both nostrils daily. (Patient taking differently: Place 2 sprays into both nostrils daily as needed for allergies. ) 16 g 6  . glucose blood (ACCU-CHEK GUIDE) test strip UUSE TO CHECK BLOOD SUGARS TWICE A DAY 200 each 3  . hydrochlorothiazide (MICROZIDE) 12.5 MG capsule Take 1 capsule by mouth once daily 30 capsule 1  . Insulin Glargine, 1 Unit Dial, (TOUJEO SOLOSTAR) 300 UNIT/ML SOPN 18 Units by Subconjunctival route daily as needed (for blood sugars greater than 120). 3 pen 3  . loratadine (CLARITIN) 10 MG tablet Take 1 tablet (10 mg total) by mouth daily. 90 tablet 3  . Magnesium 400 MG TABS Take 400 mg by mouth daily after lunch.     . metFORMIN (GLUCOPHAGE) 500 MG tablet Take 1 tablet (500 mg total) by mouth 2 (two) times daily with a meal. 180 tablet 3  . Omega-3 1000 MG CAPS Take 1,000 mg by mouth daily after lunch.    . polyethylene glycol powder (GLYCOLAX/MIRALAX) 17 GM/SCOOP powder polyethylene glycol 3350 17 gram oral powder packet  TAKE 17 GRAMS BY MOUTH TWICE DAILY    . pravastatin (PRAVACHOL)  20 MG tablet Take 1 tablet (20 mg total) by mouth daily. 90 tablet 3  . Respiratory Therapy Supplies (NEBULIZER/TUBING/MOUTHPIECE) KIT every 4-6 hours 1 each 3  . traMADol (ULTRAM) 50 MG tablet Take 1-2 tablets (50-100 mg total) by mouth every 6 (six) hours as needed for moderate pain. 40 tablet 0  . vitamin B-12 (CYANOCOBALAMIN) 500 MCG tablet Take 500 mcg by mouth daily after lunch.      Current Facility-Administered Medications  Medication Dose Route Frequency Provider Last Rate Last Admin  . chlorpheniramine (CHLOR-TRIMETON) tablet 4 mg  4 mg Oral QHS [name], MD        REVIEW OF SYSTEMS:   Constitutional: Denies fevers, chills or abnormal night sweats Eyes: Denies blurriness of vision, double vision or watery eyes Ears, nose, mouth, throat, and face: Denies mucositis or sore throat Gastrointestinal:  Denies nausea, heartburn or change in bowel habits Skin: Denies abnormal skin rashes Lymphatics: Denies new lymphadenopathy or easy bruising Neurological:Denies numbness, tingling or new weaknesses Behavioral/Psych: Mood is stable, no new changes  All other systems were reviewed with the patient and are negative.  PHYSICAL EXAMINATION: ECOG PERFORMANCE STATUS: 1 - Symptomatic but completely ambulatory  Vitals:   11/28/19 1057  BP: (!) 141/83  Pulse: 65  Resp: 18  Temp: 98.2 F (36.8 C)  SpO2: 100%   Filed Weights   11/28/19 1057  Weight: 219 lb 3.2 oz (99.4 kg)    GENERAL:alert, no distress and comfortable NEURO: no focal motor/sensory deficits  LABORATORY DATA:  I have reviewed the data as listed Lab Results  Component Value Date   WBC 9.1 11/28/2019   HGB 12.2 11/28/2019   HCT 38.5 11/28/2019   MCV 89.3 11/28/2019   PLT 264 11/28/2019   Recent Labs    01/23/19 1400 01/28/19 0244 04/29/19 1015 05/29/19 0926 11/28/19 1028  NA   < > 133* 135 137 135  K   < > 5.0 4.5 4.2 5.2*  CL   < > 98 101 104 103  CO2   < >  $'25 26 26 'r$ 21*  GLUCOSE   < > 144* 106*  104* 168*  BUN   < > '12 16 16 10  '$ CREATININE   < > 0.94 1.02 1.10* 1.19*  CALCIUM   < > 9.0 9.7 9.8 9.4  GFRNONAA  --  >60  --  52* 48*  GFRAA  --  >60  --  >60 55*  PROT   < >  --  7.6 7.9 7.6  ALBUMIN   < >  --  3.9 4.3 3.6  AST   < >  --  14 14* 16  ALT   < >  --  '13 10 12  '$ ALKPHOS   < >  --  84 70 80  BILITOT   < >  --  0.4 0.4 0.3  BILIDIR  --   --  0.1  --   --    < > = values in this interval not displayed.    RADIOGRAPHIC STUDIES: I have personally reviewed the radiological images as listed and agreed with the findings in the report. CT Chest High Resolution  Result Date: 11/19/2019 CLINICAL DATA:  Shortness of breath. Cough for 2 years. Bronchitis. EXAM: CT CHEST WITHOUT CONTRAST TECHNIQUE: Multidetector CT imaging of the chest was performed following the standard protocol without intravenous contrast. High resolution imaging of the lungs, as well as inspiratory and expiratory imaging, was performed. COMPARISON:  02/27/2018. FINDINGS: Cardiovascular: Atherosclerotic calcification of the aorta. Heart size normal. No pericardial effusion. Mediastinum/Nodes: No pathologically enlarged mediastinal or axillary lymph nodes. Hilar regions are difficult to evaluate without IV contrast. Esophagus is grossly unremarkable. Small hiatal hernia. Lungs/Pleura: Mild scarring and mucoid impaction in both lower lobes. Negative for subpleural reticulation, traction bronchiectasis/bronchiolectasis, architectural distortion or honeycombing. No pleural fluid. Airway is unremarkable. Expiratory phase imaging was not performed in true expiration, limiting the evaluation for air trapping. Upper Abdomen: Visualized portions of the liver, gallbladder, adrenal glands and right kidney are unremarkable. Low-attenuation lesion in the left kidney measures at least 4.4 cm and is similar to 02/27/2018 but incompletely imaged. Left renal collecting system appears dilated but is incompletely imaged. There may be a  subcentimeter angiomyolipoma in the left kidney. Visualized portions of the spleen, pancreas, stomach and bowel are unremarkable with exception of a small hiatal hernia. Musculoskeletal: Degenerative changes in the spine. In no worrisome lytic or sclerotic lesions. IMPRESSION: 1. No evidence of interstitial lung disease. 2. Visualized portion of the left intrarenal collecting system appears prominent but is incompletely imaged. Please correlate clinically. 3. Postinfectious/postinflammatory scarring in both lower lobes. 4.  Aortic atherosclerosis (ICD10-I70.0). Electronically Signed   By: Lorin Picket M.D.   On: 11/19/2019 16:59    ASSESSMENT & PLAN:  History of pulmonary embolus (PE) History of pulmonary embolism appears to be unprovoked The patient have other risk factors that could predispose her to recurrent thromboembolism She is currently on preventative, prophylactic low-dose Eliquis The cost is quite prohibitive We discussed different strategies to get her medications refilled with different pharmacy or to switch her to different brand such as Xarelto For now, she will continue anticoagulation therapy indefinitely  Obesity, Class III, BMI 40-49.9 (morbid obesity) ([place]) We have extensive discussions about the importance of dietary modification and weight loss The patient appears to be interested to get referral to weight management center  CKD (chronic kidney disease) stage 3, GFR 30-59 ml/min She has mild chronic kidney disease likely secondary to dehydration and other risk factors such as diabetes  and hypertension We discussed the importance of dietary modification I recommend her to drink more fluid With her current calculated creatinine clearance, there is no contraindication for her to continue on Eliquis  Diabetes mellitus type 2 in obese [facility]) We discussed the importance of dietary modification and weight loss strategies She will continue follow-up with her primary care  doctor for further management   Orders Placed This Encounter  Procedures  . CBC with Differential/Platelet    Standing Status:   Future    Standing Expiration Date:   11/27/2020  . Basic Metabolic Panel - [place] Only    Standing Status:   Future    Standing Expiration Date:   11/27/2020  . Amb Ref to Medical Weight Management    Referral Priority:   Routine    Referral Type:   Consultation    Number of Visits Requested:   1    All questions were answered. The patient knows to call the clinic with any problems, questions or concerns. The total time spent in the appointment was 35 minutes encounter with patients including review of chart and various tests results, discussions about plan of care and coordination of care plan   [name], MD 11/28/2019 4:21 PM

## 2019-12-01 ENCOUNTER — Telehealth: Payer: Self-pay

## 2019-12-01 ENCOUNTER — Other Ambulatory Visit: Payer: Medicare Other

## 2019-12-01 ENCOUNTER — Ambulatory Visit: Payer: Medicare Other | Admitting: Hematology & Oncology

## 2019-12-01 ENCOUNTER — Telehealth: Payer: Self-pay | Admitting: Hematology and Oncology

## 2019-12-01 ENCOUNTER — Other Ambulatory Visit: Payer: Self-pay | Admitting: Hematology and Oncology

## 2019-12-01 DIAGNOSIS — I2699 Other pulmonary embolism without acute cor pulmonale: Secondary | ICD-10-CM

## 2019-12-01 DIAGNOSIS — E1169 Type 2 diabetes mellitus with other specified complication: Secondary | ICD-10-CM

## 2019-12-01 DIAGNOSIS — E669 Obesity, unspecified: Secondary | ICD-10-CM

## 2019-12-01 MED ORDER — APIXABAN 5 MG PO TABS: 5.0000 mg | ORAL_TABLET | Freq: Two times a day (BID) | ORAL | 3 refills | Status: DC

## 2019-12-01 MED FILL — ELIQUIS 5 MG TABLET: 5 | 30 days supply | Qty: 60 | Fill #0

## 2019-12-01 NOTE — Telephone Encounter (Signed)
Called pt per 6/14 sch message - no answer. Left message for patient to call back to reschedule appt.

## 2019-12-01 NOTE — Telephone Encounter (Signed)
-----   Message from [name], MD sent at 12/01/2019 10:23 AM EDT ----- Regarding: can you call her 1) continue eliquis or is xarelto better 2) which pharmacy to refill?

## 2019-12-01 NOTE — Telephone Encounter (Signed)
Called and left below message. Ask her to call the office back. 

## 2019-12-01 NOTE — Telephone Encounter (Signed)
Given below message. She verbalized understanding. She would like Eliquis and ask that it be sent to [facility] outpatient pharmacy.

## 2019-12-04 ENCOUNTER — Ambulatory Visit: Payer: Medicare Other | Admitting: Pulmonary Disease

## 2019-12-09 ENCOUNTER — Telehealth: Payer: Self-pay | Admitting: Hematology and Oncology

## 2019-12-09 NOTE — Telephone Encounter (Signed)
R/s appt to afternoon per pt request.

## 2019-12-15 ENCOUNTER — Encounter: Payer: Self-pay | Admitting: Pulmonary Disease

## 2019-12-15 ENCOUNTER — Ambulatory Visit (INDEPENDENT_AMBULATORY_CARE_PROVIDER_SITE_OTHER): Payer: Medicare Other | Admitting: Pulmonary Disease

## 2019-12-15 ENCOUNTER — Other Ambulatory Visit: Payer: Self-pay

## 2019-12-15 VITALS — BP 124/76 | HR 82 | Temp 97.8°F | Ht 60.0 in | Wt 220.6 lb

## 2019-12-15 DIAGNOSIS — G4733 Obstructive sleep apnea (adult) (pediatric): Secondary | ICD-10-CM

## 2019-12-15 DIAGNOSIS — J479 Bronchiectasis, uncomplicated: Secondary | ICD-10-CM | POA: Diagnosis not present

## 2019-12-15 DIAGNOSIS — N281 Cyst of kidney, acquired: Secondary | ICD-10-CM

## 2019-12-15 MED ORDER — SODIUM CHLORIDE 3 % IN NEBU
INHALATION_SOLUTION | RESPIRATORY_TRACT | 5 refills | Status: DC | PRN
Start: 2019-12-15 — End: 2019-12-16

## 2019-12-15 NOTE — Assessment & Plan Note (Signed)
4.4 cm left simple renal cyst. Less than 1 cm angiomyolipoma  -Needs urology referral

## 2019-12-15 NOTE — Patient Instructions (Signed)
  Sputum culture and AFB. Prescription for hypertonic saline nebs twice daily # 60 x 5 refills  Okay to take chlorpheniramine periodically as cough flares up.  CPAP is working well at current settings  Referral to urology for renal cyst and angiomyolipoma

## 2019-12-15 NOTE — Assessment & Plan Note (Signed)
CPAP is working well at current settings with mild leak  Weight loss encouraged, compliance with goal of at least 4-6 hrs every night is the expectation. Advised against medications with sedative side effects Cautioned against driving when sleepy - understanding that sleepiness will vary on a day to day basis

## 2019-12-15 NOTE — Progress Notes (Signed)
   Subjective:    Patient ID: Sierra Johnson, female    DOB: 09-21-1950, 69 y.o.   MRN: [id number]  HPI  69 year old woman for follow-up of OSA and unprovoked PE 02/2018  Reports chronic cough since 2019, she went to pulmonologist at [facility] Dr. Mariana Arn -treated for allergic rhinitis and Nexium and then recommended gabapentin. CT chest 03/2019 showed new tree-in-bud infiltrates and groundglass opacities in lingula with bilateral lower lobe atelectasis and scarring and bronchiectasis Bronchoscopy recommended    Last visit, she was given chlorpheniramine/phenylephrine combination. This seems to have helped some. Cough is decreased but still present, mostly clear sputum occasionally yellow. No fever, able to sleep better  no obvious reflux symptoms or heartburn. She has perennial postnasal drip  We reviewed CT and PFTs today. CPAP download was reviewed which shows excellent control of events on 12 cm, with mild leak, good compliance. Sleeps only 6 hours per night and wonders if that is okay   Significant tests/ events reviewed PFTs 03/2019 wnl  HRCT 11/2019 >> no ILD, Postinfectious/postinflammatory scarring in both lower lobes. LT 4.4 cm renal cyst , sub cm angiomyolipoma   02/2018 CT angiogram chest showed pulmonary embolism in the right upper lobe.There was also bibasilar airspace disease and radiologist favored atelectasis or consolidation left worse than right.   Review of Systems Patient denies significant dyspnea,cough, hemoptysis,  chest pain, palpitations, pedal edema, orthopnea, paroxysmal nocturnal dyspnea, lightheadedness, nausea, vomiting, abdominal or  leg pains      Objective:   Physical Exam  Gen. Pleasant, obese, in no distress ENT - no lesions, no post nasal drip Neck: No JVD, no thyromegaly, no carotid bruits Lungs: no use of accessory muscles, no dullness to percussion, bibasal  rales no rhonchi  Cardiovascular: Rhythm regular, heart sounds  normal,  no murmurs or gallops, no peripheral edema Musculoskeletal: No deformities, no cyanosis or clubbing , no tremors       Assessment & Plan:

## 2019-12-15 NOTE — Assessment & Plan Note (Addendum)
Sputum culture and AFB -doubt MAC here  There does not seem to be any need for bronchoscopy.  Airway clearance may be the main issue Prescription for hypertonic saline nebs twice daily # 60 x 5 refills  Okay to take chlorpheniramine periodically as cough flares up. Stop decongestant Use albuterol as needed for wheezing

## 2019-12-16 ENCOUNTER — Telehealth: Payer: Self-pay | Admitting: Pulmonary Disease

## 2019-12-16 MED ORDER — SODIUM CHLORIDE 3 % IN NEBU
INHALATION_SOLUTION | RESPIRATORY_TRACT | 5 refills | Status: DC | PRN
Start: 2019-12-16 — End: 2020-12-09

## 2019-12-16 NOTE — Telephone Encounter (Signed)
Spoke with pharmacist, please see other phone note from today.  Issue with quantity has already been handled.  Pharmacist states that med is not covered by insurance but is not expensive, so patient has opted to pay for medication out of pocket. Nothing further needed at this time- will close encounter.

## 2019-12-16 NOTE — Telephone Encounter (Signed)
Returned call to [facility] to clarify order for sodium chloride nebulizer. Neb to be used twice daily.  Daphne stated that it comes in a box of 11, number changed to 50.   Nothing further needed.

## 2019-12-31 ENCOUNTER — Ambulatory Visit (INDEPENDENT_AMBULATORY_CARE_PROVIDER_SITE_OTHER): Payer: Medicare Other | Admitting: Bariatrics

## 2019-12-31 ENCOUNTER — Other Ambulatory Visit: Payer: Self-pay

## 2019-12-31 ENCOUNTER — Encounter (INDEPENDENT_AMBULATORY_CARE_PROVIDER_SITE_OTHER): Payer: Self-pay | Admitting: Bariatrics

## 2019-12-31 VITALS — BP 146/84 | HR 66 | Temp 98.3°F | Ht 60.0 in | Wt 218.0 lb

## 2019-12-31 DIAGNOSIS — E119 Type 2 diabetes mellitus without complications: Secondary | ICD-10-CM

## 2019-12-31 DIAGNOSIS — I152 Hypertension secondary to endocrine disorders: Secondary | ICD-10-CM

## 2019-12-31 DIAGNOSIS — I10 Essential (primary) hypertension: Secondary | ICD-10-CM

## 2019-12-31 DIAGNOSIS — R5383 Other fatigue: Secondary | ICD-10-CM

## 2019-12-31 DIAGNOSIS — G4733 Obstructive sleep apnea (adult) (pediatric): Secondary | ICD-10-CM

## 2019-12-31 DIAGNOSIS — Z1331 Encounter for screening for depression: Secondary | ICD-10-CM

## 2019-12-31 DIAGNOSIS — Z6841 Body Mass Index (BMI) 40.0 and over, adult: Secondary | ICD-10-CM

## 2019-12-31 DIAGNOSIS — E1159 Type 2 diabetes mellitus with other circulatory complications: Secondary | ICD-10-CM

## 2019-12-31 DIAGNOSIS — E559 Vitamin D deficiency, unspecified: Secondary | ICD-10-CM

## 2019-12-31 DIAGNOSIS — E1169 Type 2 diabetes mellitus with other specified complication: Secondary | ICD-10-CM | POA: Diagnosis not present

## 2019-12-31 DIAGNOSIS — E538 Deficiency of other specified B group vitamins: Secondary | ICD-10-CM | POA: Diagnosis not present

## 2019-12-31 DIAGNOSIS — E669 Obesity, unspecified: Secondary | ICD-10-CM | POA: Diagnosis not present

## 2019-12-31 DIAGNOSIS — D500 Iron deficiency anemia secondary to blood loss (chronic): Secondary | ICD-10-CM

## 2019-12-31 DIAGNOSIS — E785 Hyperlipidemia, unspecified: Secondary | ICD-10-CM

## 2019-12-31 DIAGNOSIS — R0602 Shortness of breath: Secondary | ICD-10-CM | POA: Diagnosis not present

## 2019-12-31 DIAGNOSIS — Z0289 Encounter for other administrative examinations: Secondary | ICD-10-CM

## 2019-12-31 MED FILL — ELIQUIS 5 MG TABLET: 5 | 30 days supply | Qty: 60 | Fill #1

## 2019-12-31 NOTE — Progress Notes (Signed)
Dear Dr. [name],   Thank you for referring Sierra Johnson to our clinic. The following note includes my evaluation and treatment recommendations.  Chief Complaint:   Sierra Johnson (MR# [id number]) is a 69 y.o. female who presents for evaluation and treatment of Sierra and related comorbidities. Current BMI is Body mass index is 42.58 kg/m.[name] Sierra Johnson has been struggling with her weight for many years and has been unsuccessful in either losing weight, maintaining weight loss, or reaching her healthy weight goal.  Sierra Johnson is currently in the action stage of change and ready to dedicate time achieving and maintaining a healthier weight. Sierra Johnson is interested in becoming our patient and working on intensive lifestyle modifications including (but not limited to) diet and exercise for weight loss.  Sierra Johnson does not enjoy cooking due to hand pain. She craves chocolate. She wants to be more vegetarian/vegan.  Sierra Johnson's habits were reviewed today and are as follows: Her family eats meals together, her desired weight loss is 38 lbs, she has been heavy most of her life, she started gaining weight after the birth of her sons, her heaviest weight ever was 245 pounds, she craves chocolate, she snacks frequently in the evenings, she is trying to follow a vegetarian diet, she is trying to follow a vegan diet, she frequently makes poor food choices, she frequently eats larger portions than normal and she has binge eating behaviors.  Depression Screen Sierra Johnson's Food and Mood (modified PHQ-9) score was 3.  Depression screen PHQ 2/9 12/31/2019  Decreased Interest 1  Down, Depressed, Hopeless 0  PHQ - 2 Score 1  Altered sleeping 1  Tired, decreased energy 1  Change in appetite 0  Feeling bad or failure about yourself  0  Trouble concentrating 0  Moving slowly or fidgety/restless 0  Suicidal thoughts 0  PHQ-9 Score 3  Difficult doing work/chores Not difficult at all   Subjective:   Other  fatigue. Sierra Johnson denies daytime somnolence and admits to waking up still tired. Sierra Johnson generally gets 6 hours of sleep per night, and states that she does not sleep well most nights. Snoring is present. Apneic episodes are not present. Epworth Sleepiness Score is 7.  SOB (shortness of breath) on exertion. Sierra Johnson notes increasing shortness of breath with certain activities and seems to be worsening over time with weight gain. She notes getting out of breath sooner with activity than she used to. This has not gotten worse recently. Andreal denies shortness of breath at rest or orthopnea.  Diabetes mellitus type 2 in obese ([place]). Sierra Johnson is taking Toujeo and metformin.  Lab Results  Component Value Date   HGBA1C 6.6 (H) 04/29/2019   HGBA1C 6.2 (H) 01/23/2019   HGBA1C 6.1 (H) 12/05/2018   Lab Results  Component Value Date   MICROALBUR 22.1 (H) 04/29/2019   LDLCALC 163 (H) 04/29/2019   CREATININE 1.19 (H) 11/28/2019   No results found for: INSULIN  Vitamin B 12 deficiency. Sierra Johnson is taking OTC B12 supplementation.   Lab Results  Component Value Date   [id number] 937 (H) 04/27/2017   Dyslipidemia associated with type 2 diabetes mellitus ([place]). Sierra Johnson is taking Omega-3, Fish Oil, and Pravachol.  OSA (obstructive sleep apnea). Sierra Johnson wears CPAP. She notes poor sleep, but CPAP helps.  Iron deficiency anemia due to chronic blood loss. Sierra Johnson is taking iron supplementation. She had a normal colonoscopy last year and had a CBC in June 2020.  CBC Latest Ref Rng & Units 11/28/2019 05/29/2019 04/29/2019  WBC 4.0 - 10.5 K/uL 9.1 8.4 8.7  Hemoglobin 12.0 - 15.0 g/dL 12.2 11.7(L) 12.1  Hematocrit 36 - 46 % 38.5 36.2 37.1  Platelets 150 - 400 K/uL 264 306 335.0   Lab Results  Component Value Date   IRON 44 11/26/2018   TIBC 330 11/26/2018   FERRITIN 42 11/26/2018   Lab Results  Component Value Date   [id number] 144 (H) 04/27/2017   Hypertension associated with diabetes ([place]). Sierra Johnson is  taking olmesartan. Blood pressure is reasonably well controlled.  BP Readings from Last 3 Encounters:  12/31/19 (!) 146/84  12/15/19 124/76  11/28/19 (!) 141/83   Lab Results  Component Value Date   CREATININE 1.19 (H) 11/28/2019   CREATININE 1.10 (H) 05/29/2019   CREATININE 1.02 04/29/2019   Vitamin D deficiency. Sierra Johnson is taking OTC Vitamin D supplementation.   Depression screening. Sierra Johnson had a negative depression screen with a PHQ-9 score of 3.  Assessment/Plan:   Other fatigue. Sierra Johnson does feel that her weight is causing her energy to be lower than it should be. Fatigue may be related to Sierra, depression or many other causes. Labs will be ordered, and in the meanwhile, Sierra Johnson will focus on self care including making healthy food choices, increasing physical activity and focusing on stress reduction. EKG 12-Lead, T3, T4, free, TSH testing ordered today.  SOB (shortness of breath) on exertion. Sierra Johnson does not feel that she gets out of breath more easily that she used to when she exercises. Sierra Johnson's shortness of breath appears to be Sierra related and exercise induced. She has agreed to work on weight loss and gradually increase exercise to treat her exercise induced shortness of breath. Will continue to monitor closely.  Diabetes mellitus type 2 in obese (Sierra Johnson). Good blood sugar control is important to decrease the likelihood of diabetic complications such as nephropathy, neuropathy, limb loss, blindness, coronary artery disease, and death. Intensive lifestyle modification including diet, exercise and weight loss are the first line of treatment for diabetes. Sierra Johnson will continue her medications as directed. Will check FBS and 2-hour postprandials. Hemoglobin A1c, Insulin, random level will be checked today. Sheet was given on Hypoglycemia or Low Blood Sugars.  Vitamin B 12 deficiency. The diagnosis was reviewed with the patient. Counseling provided today, see below. We will continue to  monitor. Orders and follow up as documented in patient record. Sierra Johnson will continue taking B12 supplementation as directed. Vitamin B12 level will be checked today.  Counseling . The body needs vitamin B12: to make red blood cells; to make DNA; and to help the nerves work properly so they can carry messages from the brain to the body.  . The main causes of vitamin B12 deficiency include dietary deficiency, digestive diseases, pernicious anemia, and having a surgery in which part of the stomach or small intestine is removed.  . Certain medicines can make it harder for the body to absorb vitamin B12. These medicines include: heartburn medications; some antibiotics; some medications used to treat diabetes, gout, and high cholesterol.  . In some cases, there are no symptoms of this condition. If the condition leads to anemia or nerve damage, various symptoms can occur, such as weakness or fatigue, shortness of breath, and numbness or tingling in your hands and feet.   . Treatment:  o May include taking vitamin B12 supplements.  o Avoid alcohol.  o Eat lots of healthy foods that contain vitamin B12: - Beef, pork, chicken, [place], and organ meats, such as liver.  -  Seafood: This includes clams, rainbow trout, salmon, tuna, and haddock.  - Eggs.  - Cereal and dairy products that are fortified: This means that vitamin B12 has been added to the food.    Dyslipidemia associated with type 2 diabetes mellitus ([place]).  Lipid Panel With LDL/HDL Ratio will be checked today. Irma will continue her medications as directed.  OSA (obstructive sleep apnea). Intensive lifestyle modifications are the first line treatment for this issue. We discussed several lifestyle modifications today and she will continue to work on diet, exercise and weight loss efforts. We will continue to monitor. Orders and follow up as documented in patient record. Cass will continue to use CPAP as directed.  Counseling  Sleep apnea is a  condition in which breathing pauses or becomes shallow during sleep. This happens over and over during the night. This disrupts your sleep and keeps your body from getting the rest that it needs, which can cause tiredness and lack of energy (fatigue) during the day.  Sleep apnea treatment: If you were given a device to open your airway while you sleep, USE IT!  Sleep hygiene:   Limit or avoid alcohol, caffeinated beverages, and cigarettes, especially close to bedtime.   Do not eat a large meal or eat spicy foods right before bedtime. This can lead to digestive discomfort that can make it hard for you to sleep.  Keep a sleep diary to help you and your health care provider figure out what could be causing your insomnia.  . Make your bedroom a dark, comfortable place where it is easy to fall asleep. ? Put up shades or blackout curtains to block light from outside. ? Use a white noise machine to block noise. ? Keep the temperature cool. . Limit screen use before bedtime. This includes: ? Watching TV. ? Using your smartphone, tablet, or computer. . Stick to a routine that includes going to bed and waking up at the same times every day and night. This can help you fall asleep faster. Consider making a quiet activity, such as reading, part of your nighttime routine. . Try to avoid taking naps during the day so that you sleep better at night. . Get out of bed if you are still awake after 15 minutes of trying to sleep. Keep the lights down, but try reading or doing a quiet activity. When you feel sleepy, go back to bed.  Iron deficiency anemia due to chronic blood loss. Orders and follow up as documented in patient record. Shamica will continue iron supplementation as directed.  Counseling . Iron is essential for our bodies to make red blood cells.  Reasons that someone may be deficient include: an iron-deficient diet (more likely in those following vegan or vegetarian diets), women with heavy menses,  patients with GI disorders or poor absorption, patients that have had bariatric surgery, frequent blood donors, patients with cancer, and patients with heart disease.   [name] An iron supplement has been recommended. This is found over-the-counter.   Marden Noble foods include dark leafy greens, red and white meats, eggs, seafood, and beans.   . Certain foods and drinks prevent your body from absorbing iron properly. Avoid eating these foods in the same meal as iron-rich foods or with iron supplements. These foods include: coffee, black tea, and red wine; milk, dairy products, and foods that are high in calcium; beans and soybeans; whole grains.  . Constipation can be a side effect of iron supplementation. Increased water and fiber intake are  helpful. Water goal: > 2 liters/day. Fiber goal: > 25 grams/day.  Hypertension associated with diabetes ([place]). Mane is working on healthy weight loss and exercise to improve blood pressure control. We will watch for signs of hypotension as she continues her lifestyle modifications. She will continue her medication as directed.   Vitamin D deficiency. Low Vitamin D level contributes to fatigue and are associated with Sierra, breast, and colon cancer. She agrees to continue to take OTC Vitamin D as directed and VITAMIN D 25 Hydroxy (Vit-D Deficiency, Fractures) level will be checked today.  Depression screening. Depression screen was negative.  Class 3 severe Sierra with serious comorbidity and body mass index (BMI) of 40.0 to 44.9 in adult, unspecified Sierra type ([place]).  Jadyn is currently in the action stage of change and her goal is to continue with weight loss efforts. I recommend Carmelia begin the structured treatment plan as follows:  She has agreed to keeping a food journal and adhering to recommended goals of 1200 calories and 70-85 grams of protein - Vegan.  She will work on meal planning, intentional eating, and will stop all sugary drinks.  We  reviewed with the patient labs from 11/28/2019 including CMP, CBC, and glucose.  Exercise goals: Alyla does water aerobics 1 hour Monday, Wednesday, and Friday and 2 hours on Tuesday and Thursday.  Behavioral modification strategies: increasing lean protein intake, decreasing simple carbohydrates, increasing vegetables, increasing water intake, decreasing eating out, no skipping meals, meal planning and cooking strategies, keeping healthy foods in the home and planning for success.  She was informed of the importance of frequent follow-up visits to maximize her success with intensive lifestyle modifications for her multiple health conditions. She was informed we would discuss her lab results at her next visit unless there is a critical issue that needs to be addressed sooner. Chad agreed to keep her next visit at the agreed upon time to discuss these results.  Objective:   Blood pressure (!) 146/84, pulse 66, temperature 98.3 F (36.8 C), height 5' (1.524 m), weight 218 lb (98.9 kg), SpO2 97 %. Body mass index is 42.58 kg/m.  EKG: Sinus  Rhythm with a rate of 70 BPM. Nonspecific T-abnormality. Otherwise normal.  Indirect Calorimeter completed today shows a VO2 of 216 and a REE of 1505.  Her calculated basal metabolic rate is 3267 thus her basal metabolic rate is worse than expected.  General: Cooperative, alert, well developed, in no acute distress. HEENT: Conjunctivae and lids unremarkable. Cardiovascular: Regular rhythm.  Lungs: Normal work of breathing. Neurologic: No focal deficits.   Lab Results  Component Value Date   CREATININE 1.19 (H) 11/28/2019   BUN 10 11/28/2019   NA 135 11/28/2019   K 5.2 (H) 11/28/2019   CL 103 11/28/2019   CO2 21 (L) 11/28/2019   Lab Results  Component Value Date   ALT 12 11/28/2019   AST 16 11/28/2019   ALKPHOS 80 11/28/2019   BILITOT 0.3 11/28/2019   Lab Results  Component Value Date   HGBA1C 6.6 (H) 04/29/2019   HGBA1C 6.2 (H)  01/23/2019   HGBA1C 6.1 (H) 12/05/2018   HGBA1C 6.3 (H) 08/20/2018   HGBA1C 6.6 (H) 04/27/2017   No results found for: INSULIN Lab Results  Component Value Date   TSH 2.21 04/29/2019   Lab Results  Component Value Date   CHOL 254 (H) 04/29/2019   HDL 73.30 04/29/2019   LDLCALC 163 (H) 04/29/2019   LDLDIRECT 119.5 08/27/2012   TRIG 84.0  04/29/2019   CHOLHDL 3 04/29/2019   Lab Results  Component Value Date   WBC 9.1 11/28/2019   HGB 12.2 11/28/2019   HCT 38.5 11/28/2019   MCV 89.3 11/28/2019   PLT 264 11/28/2019   Lab Results  Component Value Date   IRON 44 11/26/2018   TIBC 330 11/26/2018   FERRITIN 42 11/26/2018   Sierra Behavioral Intervention Visit Documentation for Insurance:   Approximately 15 minutes were spent on the discussion below.  ASK: We discussed the diagnosis of Sierra with Langley Gauss today and Anola agreed to give [place] permission to discuss Sierra behavioral modification therapy today.  ASSESS: Aliza has the diagnosis of Sierra and her BMI today is 42.6. Kip is in the action stage of change.   ADVISE: Shalay was educated on the multiple health risks of Sierra as well as the benefit of weight loss to improve her health. She was advised of the need for long term treatment and the importance of lifestyle modifications to improve her current health and to decrease her risk of future health problems.  AGREE: Multiple dietary modification options and treatment options were discussed and Mylah agreed to follow the recommendations documented in the above note.  ARRANGE: Anjelina was educated on the importance of frequent visits to treat Sierra as outlined per CMS and USPSTF guidelines and agreed to schedule her next follow up appointment today.  Attestation Statements:   Reviewed by clinician on day of visit: allergies, medications, problem list, medical history, surgical history, family history, social history, and previous encounter notes.  Migdalia Dk, am acting as Location manager for [employer], DO   I have reviewed the above documentation for accuracy and completeness, and I agree with the above. Jearld Lesch, DO

## 2020-01-01 LAB — LIPID PANEL WITH LDL/HDL RATIO
Cholesterol, Total: 195 mg/dL (ref 100–199)
HDL: 68 mg/dL (ref >39)
LDL Chol Calc (NIH): 103 mg/dL — ABNORMAL HIGH (ref 0–99)
LDL/HDL Ratio: 1.5 ratio (ref 0.0–3.2)
Triglycerides: 141 mg/dL (ref 0–149)
VLDL Cholesterol Cal: 24 mg/dL (ref 5–40)

## 2020-01-01 LAB — T4, FREE: Free T4: 1.17 ng/dL (ref 0.82–1.77)

## 2020-01-01 LAB — VITAMIN D 25 HYDROXY (VIT D DEFICIENCY, FRACTURES): Vit D, 25-Hydroxy: 30.6 ng/mL (ref 30.0–100.0)

## 2020-01-01 LAB — INSULIN, RANDOM: INSULIN: 12 u[IU]/mL (ref 2.6–24.9)

## 2020-01-01 LAB — VITAMIN B12: Vitamin B-12: 720 pg/mL (ref 232–1245)

## 2020-01-01 LAB — HEMOGLOBIN A1C
Est. average glucose Bld gHb Est-mCnc: 126 mg/dL
Hgb A1c MFr Bld: 6 % — ABNORMAL HIGH (ref 4.8–5.6)

## 2020-01-01 LAB — TSH: TSH: 2.06 u[IU]/mL (ref 0.450–4.500)

## 2020-01-01 LAB — T3: T3, Total: 109 ng/dL (ref 71–180)

## 2020-01-14 ENCOUNTER — Ambulatory Visit (INDEPENDENT_AMBULATORY_CARE_PROVIDER_SITE_OTHER): Payer: Medicare Other | Admitting: Bariatrics

## 2020-01-14 ENCOUNTER — Encounter (INDEPENDENT_AMBULATORY_CARE_PROVIDER_SITE_OTHER): Payer: Self-pay | Admitting: Bariatrics

## 2020-01-14 ENCOUNTER — Other Ambulatory Visit: Payer: Self-pay

## 2020-01-14 VITALS — BP 129/76 | HR 94 | Temp 98.4°F | Ht 60.0 in | Wt 218.0 lb

## 2020-01-14 DIAGNOSIS — I10 Essential (primary) hypertension: Secondary | ICD-10-CM

## 2020-01-14 DIAGNOSIS — Z6841 Body Mass Index (BMI) 40.0 and over, adult: Secondary | ICD-10-CM

## 2020-01-14 DIAGNOSIS — E559 Vitamin D deficiency, unspecified: Secondary | ICD-10-CM | POA: Diagnosis not present

## 2020-01-14 DIAGNOSIS — E1169 Type 2 diabetes mellitus with other specified complication: Secondary | ICD-10-CM

## 2020-01-14 DIAGNOSIS — E669 Obesity, unspecified: Secondary | ICD-10-CM

## 2020-01-14 MED ORDER — VITAMIN D (ERGOCALCIFEROL) 1.25 MG (50000 UNIT) PO CAPS: 50000.0000 [IU] | ORAL_CAPSULE | ORAL | 0 refills | Status: DC

## 2020-01-14 MED FILL — VIT D2 1.25 MG (50,000 UNIT: 1.25 MG | 28 days supply | Qty: 4 | Fill #0

## 2020-01-15 ENCOUNTER — Encounter (INDEPENDENT_AMBULATORY_CARE_PROVIDER_SITE_OTHER): Payer: Self-pay | Admitting: Bariatrics

## 2020-01-15 NOTE — Progress Notes (Signed)
Chief Complaint:   OBESITY ORIYA KETTERING is here to discuss her progress with her obesity treatment plan along with follow-up of her obesity related diagnoses. Ketra is keeping a food journal and adhering to recommended goals of 1200 calories and 70-85 grams of protein and states she is following her eating plan approximately 50% of the time. Kalya states she is doing water aerobics 120 minutes 5 times per week.  Today's visit was #: 2 Starting weight: 218 lbs Starting date: 12/31/2019 Today's weight: 218 lbs Today's date: 01/14/2020 Total lbs lost to date: 0 Total lbs lost since last in-office visit: 0  Interim History: Davonna's weight remains the same. She struggled getting in the protein in the diet. She has eaten peppermint candy.  Subjective:   Vitamin D deficiency. Dayna is taking OTC Vitamin D supplementation.    Ref. Range 12/31/2019 16:07  Vitamin D, 25-Hydroxy Latest Ref Range: 30.0 - 100.0 ng/mL 30.6   Diabetes mellitus type 2 in obese ([place]). Laticia is taking Toujeo and metformin.   Lab Results  Component Value Date   HGBA1C 6.0 (H) 12/31/2019   HGBA1C 6.6 (H) 04/29/2019   HGBA1C 6.2 (H) 01/23/2019   Lab Results  Component Value Date   MICROALBUR 22.1 (H) 04/29/2019   LDLCALC 103 (H) 12/31/2019   CREATININE 1.19 (H) 11/28/2019   Lab Results  Component Value Date   INSULIN 12.0 12/31/2019   Essential hypertension. Marleta is taking HCTZ. Blood pressure is controlled.  BP Readings from Last 3 Encounters:  01/14/20 (!) 129/76  12/31/19 (!) 146/84  12/15/19 124/76   Lab Results  Component Value Date   CREATININE 1.19 (H) 11/28/2019   CREATININE 1.10 (H) 05/29/2019   CREATININE 1.02 04/29/2019   Assessment/Plan:   Vitamin D deficiency. Low Vitamin D level contributes to fatigue and are associated with obesity, breast, and colon cancer. She was given a prescription for Vitamin D, Ergocalciferol, (DRISDOL) 1.25 MG (50000 UNIT) CAPS capsule every  week #4 with 0 refills and will follow-up for routine testing of Vitamin D, at least 2-3 times per year to avoid over-replacement.   Diabetes mellitus type 2 in obese ([place]). Good blood sugar control is important to decrease the likelihood of diabetic complications such as nephropathy, neuropathy, limb loss, blindness, coronary artery disease, and death. Intensive lifestyle modification including diet, exercise and weight loss are the first line of treatment for diabetes. Denean will continue her medications as directed.   Essential hypertension. Abia is working on healthy weight loss and exercise to improve blood pressure control. We will watch for signs of hypotension as she continues her lifestyle modifications. She will continue her medication as directed.   Class 3 severe obesity with serious comorbidity and body mass index (BMI) of 40.0 to 44.9 in adult, unspecified obesity type ([place]).  Brandin is currently in the action stage of change. As such, her goal is to continue with weight loss efforts. She has agreed to keeping a food journal and adhering to recommended goals of 1200 calories and 70-85 grams of protein ("Vegan").   She will work on meal planning and intentional eating. Handout was provided on Protein Equivalents.  Exercise goals: Tariya will continue water aerobics.  Behavioral modification strategies: increasing lean protein intake, decreasing simple carbohydrates, increasing vegetables, increasing water intake, decreasing eating out, no skipping meals, meal planning and cooking strategies, keeping healthy foods in the home and planning for success.  Kameo has agreed to follow-up with our clinic in 2-3  weeks. She was informed of the importance of frequent follow-up visits to maximize her success with intensive lifestyle modifications for her multiple health conditions.   Objective:   Blood pressure (!) 129/76, pulse 94, temperature 98.4 F (36.9 C), height 5' (1.524 m), weight  (!) 218 lb (98.9 kg), SpO2 96 %. Body mass index is 42.58 kg/m.  General: Cooperative, alert, well developed, in no acute distress. HEENT: Conjunctivae and lids unremarkable. Cardiovascular: Regular rhythm.  Lungs: Normal work of breathing. Neurologic: No focal deficits.   Lab Results  Component Value Date   CREATININE 1.19 (H) 11/28/2019   BUN 10 11/28/2019   NA 135 11/28/2019   K 5.2 (H) 11/28/2019   CL 103 11/28/2019   CO2 21 (L) 11/28/2019   Lab Results  Component Value Date   ALT 12 11/28/2019   AST 16 11/28/2019   ALKPHOS 80 11/28/2019   BILITOT 0.3 11/28/2019   Lab Results  Component Value Date   HGBA1C 6.0 (H) 12/31/2019   HGBA1C 6.6 (H) 04/29/2019   HGBA1C 6.2 (H) 01/23/2019   HGBA1C 6.1 (H) 12/05/2018   HGBA1C 6.3 (H) 08/20/2018   Lab Results  Component Value Date   INSULIN 12.0 12/31/2019   Lab Results  Component Value Date   TSH 2.060 12/31/2019   Lab Results  Component Value Date   CHOL 195 12/31/2019   HDL 68 12/31/2019   LDLCALC 103 (H) 12/31/2019   LDLDIRECT 119.5 08/27/2012   TRIG 141 12/31/2019   CHOLHDL 3 04/29/2019   Lab Results  Component Value Date   WBC 9.1 11/28/2019   HGB 12.2 11/28/2019   HCT 38.5 11/28/2019   MCV 89.3 11/28/2019   PLT 264 11/28/2019   Lab Results  Component Value Date   IRON 44 11/26/2018   TIBC 330 11/26/2018   FERRITIN 42 11/26/2018   Obesity Behavioral Intervention Documentation for Insurance:   Approximately 15 minutes were spent on the discussion below.  ASK: We discussed the diagnosis of obesity with Langley Gauss today and Krystina agreed to give [place] permission to discuss obesity behavioral modification therapy today.  ASSESS: Andreana has the diagnosis of obesity and her BMI today is 42.7. Tita is in the action stage of change.   ADVISE: Reem was educated on the multiple health risks of obesity as well as the benefit of weight loss to improve her health. She was advised of the need for long term  treatment and the importance of lifestyle modifications to improve her current health and to decrease her risk of future health problems.  AGREE: Multiple dietary modification options and treatment options were discussed and Quaniyah agreed to follow the recommendations documented in the above note.  ARRANGE: Audreanna was educated on the importance of frequent visits to treat obesity as outlined per CMS and USPSTF guidelines and agreed to schedule her next follow up appointment today.  Attestation Statements:   Reviewed by clinician on day of visit: allergies, medications, problem list, medical history, surgical history, family history, social history, and previous encounter notes.  Migdalia Dk, am acting as Location manager for [employer], DO   I have reviewed the above documentation for accuracy and completeness, and I agree with the above. Jearld Lesch, DO

## 2020-01-23 MED FILL — ELIQUIS 5 MG TABLET: 5 | 30 days supply | Qty: 60 | Fill #2

## 2020-02-03 ENCOUNTER — Other Ambulatory Visit: Payer: Self-pay

## 2020-02-03 ENCOUNTER — Ambulatory Visit (INDEPENDENT_AMBULATORY_CARE_PROVIDER_SITE_OTHER): Payer: Medicare Other | Admitting: Bariatrics

## 2020-02-03 ENCOUNTER — Encounter (INDEPENDENT_AMBULATORY_CARE_PROVIDER_SITE_OTHER): Payer: Self-pay | Admitting: Bariatrics

## 2020-02-03 VITALS — BP 130/86 | HR 85 | Temp 98.8°F | Ht 60.0 in | Wt 216.0 lb

## 2020-02-03 DIAGNOSIS — Z6841 Body Mass Index (BMI) 40.0 and over, adult: Secondary | ICD-10-CM | POA: Diagnosis not present

## 2020-02-03 DIAGNOSIS — E1169 Type 2 diabetes mellitus with other specified complication: Secondary | ICD-10-CM

## 2020-02-03 DIAGNOSIS — I10 Essential (primary) hypertension: Secondary | ICD-10-CM

## 2020-02-03 DIAGNOSIS — E669 Obesity, unspecified: Secondary | ICD-10-CM | POA: Diagnosis not present

## 2020-02-04 ENCOUNTER — Encounter (INDEPENDENT_AMBULATORY_CARE_PROVIDER_SITE_OTHER): Payer: Self-pay | Admitting: Bariatrics

## 2020-02-04 NOTE — Progress Notes (Signed)
Chief Complaint:   OBESITY Sierra Johnson is here to discuss her progress with her obesity treatment plan along with follow-up of her obesity related diagnoses. Sierra Johnson is on the [place] and jurnaling 1200 calories + 70-85 grams of protein and states she is following her eating plan approximately 100% of the time. Sierra Johnson states she is doing water aerobics 120 minutes 5 times per week.  Today's visit was #: 3 Starting weight: 218 lbs Starting date: 12/31/2019 Today's weight: 216 lbs Today's date: 02/03/2020 Total lbs lost to date: 2 Total lbs lost since last in-office visit: 2  Interim History: Sierra Johnson is down 2 lbs. She is doing well with journaling (close to goals) and reports getting in adequate protein. She is not skipping meals.  Subjective:   Essential hypertension. Sierra Johnson is taking Microzide. Blood pressure is reasonably well controlled.  BP Readings from Last 3 Encounters:  02/03/20 130/86  01/14/20 (!) 129/76  12/31/19 (!) 146/84   Lab Results  Component Value Date   CREATININE 1.19 (H) 11/28/2019   CREATININE 1.10 (H) 05/29/2019   CREATININE 1.02 04/29/2019   Diabetes mellitus type 2 in obese (Sierra Johnson). Sierra Johnson is taking Glucophage and Toujeo.  Lab Results  Component Value Date   HGBA1C 6.0 (H) 12/31/2019   HGBA1C 6.6 (H) 04/29/2019   HGBA1C 6.2 (H) 01/23/2019   Lab Results  Component Value Date   MICROALBUR 22.1 (H) 04/29/2019   LDLCALC 103 (H) 12/31/2019   CREATININE 1.19 (H) 11/28/2019   Lab Results  Component Value Date   INSULIN 12.0 12/31/2019   Assessment/Plan:   Essential hypertension. Sierra Johnson is working on healthy weight loss and exercise to improve blood pressure control. We will watch for signs of hypotension as she continues her lifestyle modifications. She will continue her medication as directed.   Diabetes mellitus type 2 in obese (Sierra Johnson). Good blood sugar control is important to decrease the likelihood of diabetic complications such  as nephropathy, neuropathy, limb loss, blindness, coronary artery disease, and death. Intensive lifestyle modification including diet, exercise and weight loss are the first line of treatment for diabetes. Sierra Johnson will continue her medications as directed.   Class 3 severe obesity with serious comorbidity and body mass index (BMI) of 40.0 to 44.9 in adult, unspecified obesity type (Sierra Johnson).  Sierra Johnson is currently in the action stage of change. As such, her goal is to continue with weight loss efforts. She has agreed to keeping a food journal and adhering to recommended goals of 1200 calories and 70-85 grams of protein/Vegan meal plan.   She will work on meal planning and intentional eating.   Handout was provided on "[employer]."  Exercise goals: Sierra Johnson will continue water aerobics for 120 minutes 5 days a week.  Behavioral modification strategies: increasing lean protein intake, decreasing simple carbohydrates, increasing vegetables, increasing water intake, decreasing eating out, no skipping meals, meal planning and cooking strategies, keeping healthy foods in the home and planning for success.  Sierra Johnson has agreed to follow-up with our clinic in 2-3 weeks. She was informed of the importance of frequent follow-up visits to maximize her success with intensive lifestyle modifications for her multiple health conditions.   Objective:   Blood pressure 130/86, pulse 85, temperature 98.8 F (37.1 C), height 5' (1.524 m), weight 216 lb (98 kg), SpO2 98 %. Body mass index is 42.18 kg/m.  General: Cooperative, alert, well developed, in no acute distress. HEENT: Conjunctivae and lids unremarkable. Cardiovascular: Regular rhythm.  Lungs: Normal  work of breathing. Neurologic: No focal deficits.   Lab Results  Component Value Date   CREATININE 1.19 (H) 11/28/2019   BUN 10 11/28/2019   NA 135 11/28/2019   K 5.2 (H) 11/28/2019   CL 103 11/28/2019   CO2 21 (L) 11/28/2019   Lab Results  Component  Value Date   ALT 12 11/28/2019   AST 16 11/28/2019   ALKPHOS 80 11/28/2019   BILITOT 0.3 11/28/2019   Lab Results  Component Value Date   HGBA1C 6.0 (H) 12/31/2019   HGBA1C 6.6 (H) 04/29/2019   HGBA1C 6.2 (H) 01/23/2019   HGBA1C 6.1 (H) 12/05/2018   HGBA1C 6.3 (H) 08/20/2018   Lab Results  Component Value Date   INSULIN 12.0 12/31/2019   Lab Results  Component Value Date   TSH 2.060 12/31/2019   Lab Results  Component Value Date   CHOL 195 12/31/2019   HDL 68 12/31/2019   LDLCALC 103 (H) 12/31/2019   LDLDIRECT 119.5 08/27/2012   TRIG 141 12/31/2019   CHOLHDL 3 04/29/2019   Lab Results  Component Value Date   WBC 9.1 11/28/2019   HGB 12.2 11/28/2019   HCT 38.5 11/28/2019   MCV 89.3 11/28/2019   PLT 264 11/28/2019   Lab Results  Component Value Date   IRON 44 11/26/2018   TIBC 330 11/26/2018   FERRITIN 42 11/26/2018   Obesity Behavioral Intervention Documentation for Insurance:   Approximately 15 minutes were spent on the discussion below.  ASK: We discussed the diagnosis of obesity with Sierra Johnson today and Sierra Johnson agreed to give [place] permission to discuss obesity behavioral modification therapy today.  ASSESS: Sierra Johnson has the diagnosis of obesity and her BMI today is 40.9. Sierra Johnson is in the action stage of change.   ADVISE: Sierra Johnson was educated on the multiple health risks of obesity as well as the benefit of weight loss to improve her health. She was advised of the need for long term treatment and the importance of lifestyle modifications to improve her current health and to decrease her risk of future health problems.  AGREE: Multiple dietary modification options and treatment options were discussed and Sierra Johnson agreed to follow the recommendations documented in the above note.  ARRANGE: Sierra Johnson was educated on the importance of frequent visits to treat obesity as outlined per CMS and USPSTF guidelines and agreed to schedule her next follow up appointment  today.  Attestation Statements:   Reviewed by clinician on day of visit: allergies, medications, problem list, medical history, surgical history, family history, social history, and previous encounter notes.  Sierra Johnson, am acting as Location manager for [employer], DO   I have reviewed the above documentation for accuracy and completeness, and I agree with the above. Jearld Lesch, DO

## 2020-02-12 ENCOUNTER — Telehealth: Payer: Self-pay

## 2020-02-12 NOTE — Telephone Encounter (Signed)
New message    The patient wants to know is she a candidate for 3rd Covid booster .   Please advise

## 2020-02-13 NOTE — Telephone Encounter (Signed)
Yes.  Sierra Johnson started vaccine booster sign up. Please call [place] [facility] at [phone]. She can also call the venue where she had her initial COVID 19 vaccination. Thanks, AP

## 2020-02-17 NOTE — Telephone Encounter (Signed)
Left pt a vm of below

## 2020-02-26 ENCOUNTER — Ambulatory Visit (INDEPENDENT_AMBULATORY_CARE_PROVIDER_SITE_OTHER): Payer: Medicare Other | Admitting: Bariatrics

## 2020-03-04 MED FILL — ELIQUIS 5 MG TABLET: 5 | 30 days supply | Qty: 60 | Fill #0

## 2020-03-06 ENCOUNTER — Other Ambulatory Visit: Payer: Self-pay | Admitting: Internal Medicine

## 2020-03-16 ENCOUNTER — Ambulatory Visit: Payer: Self-pay | Admitting: Internal Medicine

## 2020-03-22 DIAGNOSIS — Z23 Encounter for immunization: Secondary | ICD-10-CM | POA: Diagnosis not present

## 2020-04-07 MED FILL — ELIQUIS 5 MG TABLET: 5 | 30 days supply | Qty: 60 | Fill #1

## 2020-04-24 ENCOUNTER — Other Ambulatory Visit: Payer: Self-pay | Admitting: Internal Medicine

## 2020-05-04 DIAGNOSIS — E119 Type 2 diabetes mellitus without complications: Secondary | ICD-10-CM | POA: Diagnosis not present

## 2020-05-05 DIAGNOSIS — M1712 Unilateral primary osteoarthritis, left knee: Secondary | ICD-10-CM | POA: Diagnosis not present

## 2020-05-07 ENCOUNTER — Ambulatory Visit: Payer: Medicare Other

## 2020-05-10 ENCOUNTER — Encounter: Payer: Self-pay | Admitting: Internal Medicine

## 2020-05-10 ENCOUNTER — Other Ambulatory Visit: Payer: Self-pay

## 2020-05-10 ENCOUNTER — Other Ambulatory Visit: Payer: Self-pay | Admitting: Internal Medicine

## 2020-05-10 ENCOUNTER — Ambulatory Visit (INDEPENDENT_AMBULATORY_CARE_PROVIDER_SITE_OTHER): Payer: Medicare Other | Admitting: Internal Medicine

## 2020-05-10 VITALS — BP 124/82 | HR 69 | Temp 98.7°F | Wt 222.6 lb

## 2020-05-10 DIAGNOSIS — I10 Essential (primary) hypertension: Secondary | ICD-10-CM

## 2020-05-10 DIAGNOSIS — E785 Hyperlipidemia, unspecified: Secondary | ICD-10-CM | POA: Diagnosis not present

## 2020-05-10 DIAGNOSIS — E1169 Type 2 diabetes mellitus with other specified complication: Secondary | ICD-10-CM | POA: Diagnosis not present

## 2020-05-10 DIAGNOSIS — E538 Deficiency of other specified B group vitamins: Secondary | ICD-10-CM

## 2020-05-10 DIAGNOSIS — Z23 Encounter for immunization: Secondary | ICD-10-CM

## 2020-05-10 DIAGNOSIS — E669 Obesity, unspecified: Secondary | ICD-10-CM

## 2020-05-10 DIAGNOSIS — I2699 Other pulmonary embolism without acute cor pulmonale: Secondary | ICD-10-CM

## 2020-05-10 LAB — URINALYSIS
Bilirubin Urine: NEGATIVE
Hgb urine dipstick: NEGATIVE
Ketones, ur: NEGATIVE
Leukocytes,Ua: NEGATIVE
Nitrite: NEGATIVE
Specific Gravity, Urine: 1.01 (ref 1.000–1.030)
Total Protein, Urine: NEGATIVE
Urine Glucose: NEGATIVE
Urobilinogen, UA: 0.2 (ref 0.0–1.0)
pH: 6.5 (ref 5.0–8.0)

## 2020-05-10 LAB — COMPREHENSIVE METABOLIC PANEL
ALT: 15 U/L (ref 0–35)
AST: 20 U/L (ref 0–37)
Albumin: 4 g/dL (ref 3.5–5.2)
Alkaline Phosphatase: 74 U/L (ref 39–117)
BUN: 13 mg/dL (ref 6–23)
CO2: 26 mEq/L (ref 19–32)
Calcium: 9.7 mg/dL (ref 8.4–10.5)
Chloride: 102 mEq/L (ref 96–112)
Creatinine, Ser: 0.99 mg/dL (ref 0.40–1.20)
GFR: 59.23 mL/min — ABNORMAL LOW (ref >60.00)
Glucose, Bld: 83 mg/dL (ref 70–99)
Potassium: 4.1 mEq/L (ref 3.5–5.1)
Sodium: 137 mEq/L (ref 135–145)
Total Bilirubin: 0.4 mg/dL (ref 0.2–1.2)
Total Protein: 7.6 g/dL (ref 6.0–8.3)

## 2020-05-10 LAB — LIPID PANEL
Cholesterol: 176 mg/dL (ref 0–200)
HDL: 66.9 mg/dL (ref >39.00)
LDL Cholesterol: 94 mg/dL (ref 0–99)
NonHDL: 108.62
Total CHOL/HDL Ratio: 3
Triglycerides: 73 mg/dL (ref 0.0–149.0)
VLDL: 14.6 mg/dL (ref 0.0–40.0)

## 2020-05-10 LAB — CBC WITH DIFFERENTIAL/PLATELET
Basophils Absolute: 0.1 10*3/uL (ref 0.0–0.1)
Basophils Relative: 0.6 % (ref 0.0–3.0)
Eosinophils Absolute: 0.2 10*3/uL (ref 0.0–0.7)
Eosinophils Relative: 1.9 % (ref 0.0–5.0)
HCT: 38 % (ref 36.0–46.0)
Hemoglobin: 12.3 g/dL (ref 12.0–15.0)
Lymphocytes Relative: 32.2 % (ref 12.0–46.0)
Lymphs Abs: 3 10*3/uL (ref 0.7–4.0)
MCHC: 32.5 g/dL (ref 30.0–36.0)
MCV: 88.6 fl (ref 78.0–100.0)
Monocytes Absolute: 0.7 10*3/uL (ref 0.1–1.0)
Monocytes Relative: 7.6 % (ref 3.0–12.0)
Neutro Abs: 5.3 10*3/uL (ref 1.4–7.7)
Neutrophils Relative %: 57.7 % (ref 43.0–77.0)
Platelets: 308 10*3/uL (ref 150.0–400.0)
RBC: 4.29 Mil/uL (ref 3.87–5.11)
RDW: 14.7 % (ref 11.5–15.5)
WBC: 9.2 10*3/uL (ref 4.0–10.5)

## 2020-05-10 LAB — TSH: TSH: 1.6 u[IU]/mL (ref 0.35–4.50)

## 2020-05-10 LAB — HEMOGLOBIN A1C: Hgb A1c MFr Bld: 6.6 % — ABNORMAL HIGH (ref 4.6–6.5)

## 2020-05-10 MED ORDER — VITAMIN D3 50 MCG (2000 UT) PO CAPS: 2000.0000 [IU] | ORAL_CAPSULE | Freq: Every day | ORAL | 3 refills | Status: DC

## 2020-05-10 MED ORDER — OLMESARTAN MEDOXOMIL 20 MG PO TABS
20.0000 mg | ORAL_TABLET | Freq: Every day | ORAL | 3 refills | Status: DC
Start: 2020-05-10 — End: 2020-05-10

## 2020-05-10 MED ORDER — PRAVASTATIN SODIUM 20 MG PO TABS
20.0000 mg | ORAL_TABLET | Freq: Every day | ORAL | 3 refills | Status: DC
Start: 2020-05-10 — End: 2020-06-14

## 2020-05-10 MED ORDER — METFORMIN HCL 500 MG PO TABS
500.0000 mg | ORAL_TABLET | Freq: Two times a day (BID) | ORAL | 3 refills | Status: DC
Start: 2020-05-10 — End: 2020-05-24

## 2020-05-10 MED FILL — METFORMIN HCL 500 MG TABS: 500 | 90 days supply | Qty: 180 | Fill #0

## 2020-05-10 NOTE — Assessment & Plan Note (Signed)
On Eliquis

## 2020-05-10 NOTE — Progress Notes (Signed)
Subjective:  Patient ID: Sierra Johnson, female    DOB: 01-20-1951  Age: 69 y.o. MRN: [id number]  CC: Annual Exam (Flu shot)   HPI Sierra Johnson presents for a PE, HTN, DM f/u   Outpatient Medications Prior to Visit  Medication Sig Dispense Refill  . Accu-Chek FastClix Lancets MISC USE TO CHECK BLOOD SUGARS TWICE A DAY 102 each 0  . albuterol (VENTOLIN HFA) 108 (90 Base) MCG/ACT inhaler Inhale 1-2 puffs into the lungs every 6 (six) hours as needed for wheezing or shortness of breath. 8 g 0  . albuterol (VENTOLIN HFA) 108 (90 Base) MCG/ACT inhaler Inhale 2 puffs into the lungs every 6 (six) hours as needed for wheezing or shortness of breath. 1 g 0  . Ascorbic Acid (VITAMIN C) 1000 MG tablet Take 1,000 mg by mouth daily with lunch.     [name] BLACK CURRANT SEED OIL PO Take 1,250 mg by mouth daily after lunch.     . Blood Glucose Monitoring Suppl (ACCU-CHEK GUIDE) w/Device KIT 1 each by Does not apply route 2 (two) times daily. DX: E11.9 1 kit 0  . CALCIUM PO Take 1,000 mg by mouth daily after lunch.     . Cholecalciferol (VITAMIN D3) 1000 UNITS CAPS Take 1,000 Units by mouth daily after lunch.     . ECHINACEA PO Take 1,200 mg by mouth daily after lunch.     . ELDERBERRY PO Take 5-10 mLs by mouth daily after lunch.     . ferrous sulfate 325 (65 FE) MG tablet Take 25 mg by mouth daily with breakfast.    . Flaxseed, Linseed, (FLAX SEED OIL PO) Take 1,250 mg by mouth daily after lunch.    . fluticasone (FLONASE) 50 MCG/ACT nasal spray Place 2 sprays into both nostrils daily. (Patient taking differently: Place 2 sprays into both nostrils daily as needed for allergies. ) 16 g 6  . glucose blood (ACCU-CHEK GUIDE) test strip UUSE TO CHECK BLOOD SUGARS TWICE A DAY 200 each 3  . hydrochlorothiazide (MICROZIDE) 12.5 MG capsule Take 1 capsule by mouth once daily 30 capsule 1  . Insulin Glargine, 1 Unit Dial, (TOUJEO SOLOSTAR) 300 UNIT/ML SOPN 18 Units by Subconjunctival route daily as needed (for blood  sugars greater than 120). 3 pen 3  . loratadine (CLARITIN) 10 MG tablet Take 1 tablet (10 mg total) by mouth daily. 90 tablet 3  . Magnesium 400 MG TABS Take 400 mg by mouth daily after lunch.     . metFORMIN (GLUCOPHAGE) 500 MG tablet Take 1 tablet (500 mg total) by mouth 2 (two) times daily with a meal. Annual appt is due w/labs must see provider for future refills 60 tablet 0  . olmesartan (BENICAR) 20 MG tablet TAKE 1 TABLET BY MOUTH EVERY DAY 90 tablet 3  . Omega-3 1000 MG CAPS Take 1,000 mg by mouth daily after lunch.    . polyethylene glycol powder (GLYCOLAX/MIRALAX) 17 GM/SCOOP powder polyethylene glycol 3350 17 gram oral powder packet  TAKE 17 GRAMS BY MOUTH TWICE DAILY    . pravastatin (PRAVACHOL) 20 MG tablet Take 1 tablet (20 mg total) by mouth daily. 90 tablet 3  . Respiratory Therapy Supplies (NEBULIZER/TUBING/MOUTHPIECE) KIT every 4-6 hours 1 each 3  . sodium chloride HYPERTONIC 3 % nebulizer solution Take by nebulization as needed for other. Take twice daily 60 mL 5  . traMADol (ULTRAM) 50 MG tablet Take 1-2 tablets (50-100 mg total) by mouth every 6 (six) hours as  needed for moderate pain. 40 tablet 0  . Turmeric 500 MG CAPS Take 1,500 mg by mouth.    . vitamin B-12 (CYANOCOBALAMIN) 500 MCG tablet Take 500 mcg by mouth daily after lunch.     [name] apixaban (ELIQUIS) 5 MG TABS tablet Take 1 tablet (5 mg total) by mouth 2 (two) times daily. 90 tablet 3  . Vitamin D, Ergocalciferol, (DRISDOL) 1.25 MG (50000 UNIT) CAPS capsule Take 1 capsule (50,000 Units total) by mouth every 7 (seven) days. (Patient not taking: Reported on 05/10/2020) 4 capsule 0  . chlorpheniramine (CHLOR-TRIMETON) tablet 4 mg      No facility-administered medications prior to visit.    ROS: Review of Systems  Constitutional: Negative for activity change, appetite change, chills, fatigue and unexpected weight change.  HENT: Negative for congestion, mouth sores and sinus pressure.   Eyes: Negative for visual  disturbance.  Respiratory: Negative for cough and chest tightness.   Gastrointestinal: Negative for abdominal pain and nausea.  Genitourinary: Negative for difficulty urinating, frequency and vaginal pain.  Musculoskeletal: Negative for back pain and gait problem.  Skin: Negative for pallor and rash.  Neurological: Negative for dizziness, tremors, weakness, numbness and headaches.  Psychiatric/Behavioral: Negative for confusion and sleep disturbance.    Objective:  BP 124/82 (BP Location: Left Arm)   Pulse 69   Temp 98.7 F (37.1 C) (Oral)   Wt 222 lb 9.6 oz (101 kg)   SpO2 96%   BMI 43.47 kg/m   BP Readings from Last 3 Encounters:  05/10/20 124/82  02/03/20 130/86  01/14/20 (!) 129/76    Wt Readings from Last 3 Encounters:  05/10/20 222 lb 9.6 oz (101 kg)  02/03/20 216 lb (98 kg)  01/14/20 (!) 218 lb (98.9 kg)    Physical Exam Constitutional:      General: She is not in acute distress.    Appearance: She is well-developed.  HENT:     Head: Normocephalic.     Right Ear: External ear normal.     Left Ear: External ear normal.     Nose: Nose normal.  Eyes:     General:        Right eye: No discharge.        Left eye: No discharge.     Conjunctiva/sclera: Conjunctivae normal.     Pupils: Pupils are equal, round, and reactive to light.  Neck:     Thyroid: No thyromegaly.     Vascular: No JVD.     Trachea: No tracheal deviation.  Cardiovascular:     Rate and Rhythm: Normal rate and regular rhythm.     Heart sounds: Normal heart sounds.  Pulmonary:     Effort: No respiratory distress.     Breath sounds: No stridor. No wheezing.  Abdominal:     General: Bowel sounds are normal. There is no distension.     Palpations: Abdomen is soft. There is no mass.     Tenderness: There is no abdominal tenderness. There is no guarding or rebound.  Musculoskeletal:        General: No tenderness.     Cervical back: Normal range of motion and neck supple.  Lymphadenopathy:      Cervical: No cervical adenopathy.  Skin:    Findings: No erythema or rash.  Neurological:     Mental Status: She is oriented to person, place, and time.     Cranial Nerves: No cranial nerve deficit.     Motor: No abnormal muscle tone.  Coordination: Coordination normal.     Deep Tendon Reflexes: Reflexes normal.  Psychiatric:        Behavior: Behavior normal.        Thought Content: Thought content normal.        Judgment: Judgment normal.     Lab Results  Component Value Date   WBC 9.1 11/28/2019   HGB 12.2 11/28/2019   HCT 38.5 11/28/2019   PLT 264 11/28/2019   GLUCOSE 168 (H) 11/28/2019   CHOL 195 12/31/2019   TRIG 141 12/31/2019   HDL 68 12/31/2019   LDLDIRECT 119.5 08/27/2012   LDLCALC 103 (H) 12/31/2019   ALT 12 11/28/2019   AST 16 11/28/2019   NA 135 11/28/2019   K 5.2 (H) 11/28/2019   CL 103 11/28/2019   CREATININE 1.19 (H) 11/28/2019   BUN 10 11/28/2019   CO2 21 (L) 11/28/2019   TSH 2.060 12/31/2019   INR 1.0 01/23/2019   HGBA1C 6.0 (H) 12/31/2019   MICROALBUR 22.1 (H) 04/29/2019    CT Chest High Resolution  Result Date: 11/19/2019 CLINICAL DATA:  Shortness of breath. Cough for 2 years. Bronchitis. EXAM: CT CHEST WITHOUT CONTRAST TECHNIQUE: Multidetector CT imaging of the chest was performed following the standard protocol without intravenous contrast. High resolution imaging of the lungs, as well as inspiratory and expiratory imaging, was performed. COMPARISON:  02/27/2018. FINDINGS: Cardiovascular: Atherosclerotic calcification of the aorta. Heart size normal. No pericardial effusion. Mediastinum/Nodes: No pathologically enlarged mediastinal or axillary lymph nodes. Hilar regions are difficult to evaluate without IV contrast. Esophagus is grossly unremarkable. Small hiatal hernia. Lungs/Pleura: Mild scarring and mucoid impaction in both lower lobes. Negative for subpleural reticulation, traction bronchiectasis/bronchiolectasis, architectural distortion or  honeycombing. No pleural fluid. Airway is unremarkable. Expiratory phase imaging was not performed in true expiration, limiting the evaluation for air trapping. Upper Abdomen: Visualized portions of the liver, gallbladder, adrenal glands and right kidney are unremarkable. Low-attenuation lesion in the left kidney measures at least 4.4 cm and is similar to 02/27/2018 but incompletely imaged. Left renal collecting system appears dilated but is incompletely imaged. There may be a subcentimeter angiomyolipoma in the left kidney. Visualized portions of the spleen, pancreas, stomach and bowel are unremarkable with exception of a small hiatal hernia. Musculoskeletal: Degenerative changes in the spine. In no worrisome lytic or sclerotic lesions. IMPRESSION: 1. No evidence of interstitial lung disease. 2. Visualized portion of the left intrarenal collecting system appears prominent but is incompletely imaged. Please correlate clinically. 3. Postinfectious/postinflammatory scarring in both lower lobes. 4.  Aortic atherosclerosis (ICD10-I70.0). Electronically Signed   By: Lorin Picket M.D.   On: 11/19/2019 16:59    Assessment & Plan:    Walker Kehr, MD

## 2020-05-10 NOTE — Assessment & Plan Note (Signed)
Benicar 

## 2020-05-10 NOTE — Addendum Note (Signed)
Addended by: Trenda Moots on: 97/74/1423 02:32 PM   Modules accepted: Orders

## 2020-05-10 NOTE — Assessment & Plan Note (Signed)
On B12 

## 2020-05-10 NOTE — Assessment & Plan Note (Signed)
Metformin Labs 

## 2020-05-12 ENCOUNTER — Encounter: Payer: Self-pay | Admitting: Internal Medicine

## 2020-05-12 DIAGNOSIS — R944 Abnormal results of kidney function studies: Secondary | ICD-10-CM | POA: Insufficient documentation

## 2020-05-12 DIAGNOSIS — M25562 Pain in left knee: Secondary | ICD-10-CM | POA: Diagnosis not present

## 2020-05-14 MED FILL — OLMESARTAN MEDOXOMIL 20 MG: 20 | 90 days supply | Qty: 90 | Fill #0

## 2020-05-20 DIAGNOSIS — M1712 Unilateral primary osteoarthritis, left knee: Secondary | ICD-10-CM | POA: Diagnosis not present

## 2020-05-20 DIAGNOSIS — Z20822 Contact with and (suspected) exposure to covid-19: Secondary | ICD-10-CM | POA: Diagnosis not present

## 2020-05-23 ENCOUNTER — Other Ambulatory Visit: Payer: Self-pay | Admitting: Internal Medicine

## 2020-05-31 ENCOUNTER — Ambulatory Visit (INDEPENDENT_AMBULATORY_CARE_PROVIDER_SITE_OTHER): Payer: Medicare Other

## 2020-05-31 DIAGNOSIS — Z0000 Encounter for general adult medical examination without abnormal findings: Secondary | ICD-10-CM

## 2020-05-31 NOTE — Patient Instructions (Signed)
Sierra Johnson , Thank you for taking time to come for your Medicare Wellness Visit. I appreciate your ongoing commitment to your health goals. Please review the following plan we discussed and let me know if I can assist you in the future.   Screening recommendations/referrals: Colonoscopy: 11/18/2018; (Cologuard, due very 3 years) Mammogram: per patient done at OB/GYN 2021 Bone Density: per patient done at OB/GYN office 2021 Recommended yearly ophthalmology/optometry visit for glaucoma screening and checkup Recommended yearly dental visit for hygiene and checkup  Vaccinations: Influenza vaccine: 05/10/2020 Pneumococcal vaccine: Prevnar 13 done 05/07/2019; need Pneumovax 23 Tdap vaccine: 05/07/2019; due every 10 years  Shingles vaccine: 08/06/2016, 08/10/2017    Covid-19: 07/24/2019, 08/14/2019, 03/22/2020  Advanced directives: Advance directive discussed with you today. Even though you declined this today please call our office should you change your mind and we can give you the proper paperwork for you to fill out.  Conditions/risks identified: Yes; Reviewed health maintenance screenings with patient today and relevant education, vaccines, and/or referrals were provided. Please continue to do your personal lifestyle choices by: daily care of teeth and gums, regular physical activity (goal should be 5 days a week for 30 minutes), eat a healthy diet, avoid tobacco and drug use, limiting any alcohol intake, taking a low-dose aspirin (if not allergic or have been advised by your provider otherwise) and taking vitamins and minerals as recommended by your provider. Continue doing brain stimulating activities (puzzles, reading, adult coloring books, staying active) to keep memory sharp. Continue to eat heart healthy diet (full of fruits, vegetables, whole grains, lean protein, water--limit salt, fat, and sugar intake) and increase physical activity as tolerated.  Next appointment: Please schedule your next  Medicare Wellness Visit with your Nurse Health Advisor in 1 year by calling [phone].   Preventive Care 30 Years and Older, Female Preventive care refers to lifestyle choices and visits with your health care provider that can promote health and wellness. What does preventive care include?  A yearly physical exam. This is also called an annual well check.  Dental exams once or twice a year.  Routine eye exams. Ask your health care provider how often you should have your eyes checked.  Personal lifestyle choices, including:  Daily care of your teeth and gums.  Regular physical activity.  Eating a healthy diet.  Avoiding tobacco and drug use.  Limiting alcohol use.  Practicing safe sex.  Taking low-dose aspirin every day.  Taking vitamin and mineral supplements as recommended by your health care provider. What happens during an annual well check? The services and screenings done by your health care provider during your annual well check will depend on your age, overall health, lifestyle risk factors, and family history of disease. Counseling  Your health care provider may ask you questions about your:  Alcohol use.  Tobacco use.  Drug use.  Emotional well-being.  Home and relationship well-being.  Sexual activity.  Eating habits.  History of falls.  Memory and ability to understand (cognition).  Work and work Statistician.  Reproductive health. Screening  You may have the following tests or measurements:  Height, weight, and BMI.  Blood pressure.  Lipid and cholesterol levels. These may be checked every 5 years, or more frequently if you are over 57 years old.  Skin check.  Lung cancer screening. You may have this screening every year starting at age 63 if you have a 30-pack-year history of smoking and currently smoke or have quit within the past 15 years.  Fecal occult blood test (FOBT) of the stool. You may have this test every year starting at  age 35.  Flexible sigmoidoscopy or colonoscopy. You may have a sigmoidoscopy every 5 years or a colonoscopy every 10 years starting at age 53.  Hepatitis C blood test.  Hepatitis B blood test.  Sexually transmitted disease (STD) testing.  Diabetes screening. This is done by checking your blood sugar (glucose) after you have not eaten for a while (fasting). You may have this done every 1-3 years.  Bone density scan. This is done to screen for osteoporosis. You may have this done starting at age 25.  Mammogram. This may be done every 1-2 years. Talk to your health care provider about how often you should have regular mammograms. Talk with your health care provider about your test results, treatment options, and if necessary, the need for more tests. Vaccines  Your health care provider may recommend certain vaccines, such as:  Influenza vaccine. This is recommended every year.  Tetanus, diphtheria, and acellular pertussis (Tdap, Td) vaccine. You may need a Td booster every 10 years.  Zoster vaccine. You may need this after age 24.  Pneumococcal 13-valent conjugate (PCV13) vaccine. One dose is recommended after age 53.  Pneumococcal polysaccharide (PPSV23) vaccine. One dose is recommended after age 70. Talk to your health care provider about which screenings and vaccines you need and how often you need them. This information is not intended to replace advice given to you by your health care provider. Make sure you discuss any questions you have with your health care provider. Document Released: 07/02/2015 Document Revised: 02/23/2016 Document Reviewed: 04/06/2015 Elsevier Interactive Patient Education  2017 [employer] Prevention in the Home Falls can cause injuries. They can happen to people of all ages. There are many things you can do to make your home safe and to help prevent falls. What can I do on the outside of my home?  Regularly fix the edges of walkways and  driveways and fix any cracks.  Remove anything that might make you trip as you walk through a door, such as a raised step or threshold.  Trim any bushes or trees on the path to your home.  Use bright outdoor lighting.  Clear any walking paths of anything that might make someone trip, such as rocks or tools.  Regularly check to see if handrails are loose or broken. Make sure that both sides of any steps have handrails.  Any raised decks and porches should have guardrails on the edges.  Have any leaves, snow, or ice cleared regularly.  Use sand or salt on walking paths during winter.  Clean up any spills in your garage right away. This includes oil or grease spills. What can I do in the bathroom?  Use night lights.  Install grab bars by the toilet and in the tub and shower. Do not use towel bars as grab bars.  Use non-skid mats or decals in the tub or shower.  If you need to sit down in the shower, use a plastic, non-slip stool.  Keep the floor dry. Clean up any water that spills on the floor as soon as it happens.  Remove soap buildup in the tub or shower regularly.  Attach bath mats securely with double-sided non-slip rug tape.  Do not have throw rugs and other things on the floor that can make you trip. What can I do in the bedroom?  Use night lights.  Make sure that  you have a light by your bed that is easy to reach.  Do not use any sheets or blankets that are too big for your bed. They should not hang down onto the floor.  Have a firm chair that has side arms. You can use this for support while you get dressed.  Do not have throw rugs and other things on the floor that can make you trip. What can I do in the kitchen?  Clean up any spills right away.  Avoid walking on wet floors.  Keep items that you use a lot in easy-to-reach places.  If you need to reach something above you, use a strong step stool that has a grab bar.  Keep electrical cords out of the  way.  Do not use floor polish or wax that makes floors slippery. If you must use wax, use non-skid floor wax.  Do not have throw rugs and other things on the floor that can make you trip. What can I do with my stairs?  Do not leave any items on the stairs.  Make sure that there are handrails on both sides of the stairs and use them. Fix handrails that are broken or loose. Make sure that handrails are as long as the stairways.  Check any carpeting to make sure that it is firmly attached to the stairs. Fix any carpet that is loose or worn.  Avoid having throw rugs at the top or bottom of the stairs. If you do have throw rugs, attach them to the floor with carpet tape.  Make sure that you have a light switch at the top of the stairs and the bottom of the stairs. If you do not have them, ask someone to add them for you. What else can I do to help prevent falls?  Wear shoes that:  Do not have high heels.  Have rubber bottoms.  Are comfortable and fit you well.  Are closed at the toe. Do not wear sandals.  If you use a stepladder:  Make sure that it is fully opened. Do not climb a closed stepladder.  Make sure that both sides of the stepladder are locked into place.  Ask someone to hold it for you, if possible.  Clearly mark and make sure that you can see:  Any grab bars or handrails.  First and last steps.  Where the edge of each step is.  Use tools that help you move around (mobility aids) if they are needed. These include:  Canes.  Walkers.  Scooters.  Crutches.  Turn on the lights when you go into a dark area. Replace any light bulbs as soon as they burn out.  Set up your furniture so you have a clear path. Avoid moving your furniture around.  If any of your floors are uneven, fix them.  If there are any pets around you, be aware of where they are.  Review your medicines with your doctor. Some medicines can make you feel dizzy. This can increase your chance  of falling. Ask your doctor what other things that you can do to help prevent falls. This information is not intended to replace advice given to you by your health care provider. Make sure you discuss any questions you have with your health care provider. Document Released: 04/01/2009 Document Revised: 11/11/2015 Document Reviewed: 07/10/2014 Elsevier Interactive Patient Education  2017 [employer].

## 2020-05-31 NOTE — Progress Notes (Addendum)
I connected with Park Meo today by telephone and verified that I am speaking with the correct person using two identifiers. Location patient: home Location provider: work Persons participating in the virtual visit: Yarelly Kuba and Lisette Abu, LPN.   I discussed the limitations, risks, security and privacy concerns of performing an evaluation and management service by telephone and the availability of in person appointments. I also discussed with the patient that there may be a patient responsible charge related to this service. The patient expressed understanding and verbally consented to this telephonic visit.    Interactive audio and video telecommunications were attempted between this provider and patient, however failed, due to patient having technical difficulties OR patient did not have access to video capability.  We continued and completed visit with audio only.  Some vital signs may be absent or patient reported.   Time Spent with patient on telephone encounter: 30 minutes  Subjective:   Sierra DELOACH is a 69 y.o. female who presents for Medicare Annual (Subsequent) preventive examination.  Review of Systems    No ROS. Medicare Wellness Visit. Additional risk factors are reflected in social history. Cardiac Risk Factors include: advanced age (>3men, >68 women);diabetes mellitus;dyslipidemia;hypertension;family history of premature cardiovascular disease     Objective:    There were no vitals filed for this visit. There is no height or weight on file to calculate BMI.  Advanced Directives 05/31/2020 05/29/2019 01/27/2019 01/23/2019 12/05/2018 11/26/2018 08/20/2018  Does Patient Have a Medical Advance Directive? No No No No No No No  Would patient like information on creating a medical advance directive? No - Patient declined No - Patient declined No - Patient declined - No - Patient declined No - Patient declined No - Patient declined    Current Medications  (verified) Outpatient Encounter Medications as of 05/31/2020  Medication Sig   Accu-Chek FastClix Lancets MISC USE TO CHECK BLOOD SUGARS TWICE A DAY   albuterol (VENTOLIN HFA) 108 (90 Base) MCG/ACT inhaler Inhale 1-2 puffs into the lungs every 6 (six) hours as needed for wheezing or shortness of breath.   albuterol (VENTOLIN HFA) 108 (90 Base) MCG/ACT inhaler Inhale 2 puffs into the lungs every 6 (six) hours as needed for wheezing or shortness of breath.   apixaban (ELIQUIS) 5 MG TABS tablet Take 1 tablet (5 mg total) by mouth 2 (two) times daily.   Ascorbic Acid (VITAMIN C) 1000 MG tablet Take 1,000 mg by mouth daily with lunch.    BLACK CURRANT SEED OIL PO Take 1,250 mg by mouth daily after lunch.    Blood Glucose Monitoring Suppl (ACCU-CHEK GUIDE) w/Device KIT 1 each by Does not apply route 2 (two) times daily. DX: E11.9   CALCIUM PO Take 1,000 mg by mouth daily after lunch.    Cholecalciferol (VITAMIN D3) 50 MCG (2000 UT) capsule Take 1 capsule (2,000 Units total) by mouth daily.   ECHINACEA PO Take 1,200 mg by mouth daily after lunch.    ELDERBERRY PO Take 5-10 mLs by mouth daily after lunch.    ferrous sulfate 325 (65 FE) MG tablet Take 25 mg by mouth daily with breakfast.   Flaxseed, Linseed, (FLAX SEED OIL PO) Take 1,250 mg by mouth daily after lunch.   fluticasone (FLONASE) 50 MCG/ACT nasal spray Place 2 sprays into both nostrils daily. (Patient taking differently: Place 2 sprays into both nostrils daily as needed for allergies. )   glucose blood (ACCU-CHEK GUIDE) test strip UUSE TO CHECK BLOOD SUGARS TWICE A  DAY   hydrochlorothiazide (MICROZIDE) 12.5 MG capsule Take 1 capsule by mouth once daily   Insulin Glargine, 1 Unit Dial, (TOUJEO SOLOSTAR) 300 UNIT/ML SOPN 18 Units by Subconjunctival route daily as needed (for blood sugars greater than 120).   loratadine (CLARITIN) 10 MG tablet Take 1 tablet (10 mg total) by mouth daily.   Magnesium 400 MG TABS Take 400 mg by mouth daily after  lunch.    metFORMIN (GLUCOPHAGE) 500 MG tablet TAKE 1 TABLET 2 TIMES DAILY WITH A MEAL. ANNUAL APPT IS DUE W/LABS MUST SEE PROVIDER REFILLS   olmesartan (BENICAR) 20 MG tablet Take 1 tablet (20 mg total) by mouth daily.   Omega-3 1000 MG CAPS Take 1,000 mg by mouth daily after lunch.   polyethylene glycol powder (GLYCOLAX/MIRALAX) 17 GM/SCOOP powder polyethylene glycol 3350 17 gram oral powder packet  TAKE 17 GRAMS BY MOUTH TWICE DAILY   pravastatin (PRAVACHOL) 20 MG tablet Take 1 tablet (20 mg total) by mouth daily.   Respiratory Therapy Supplies (NEBULIZER/TUBING/MOUTHPIECE) KIT every 4-6 hours   sodium chloride HYPERTONIC 3 % nebulizer solution Take by nebulization as needed for other. Take twice daily   traMADol (ULTRAM) 50 MG tablet Take 1-2 tablets (50-100 mg total) by mouth every 6 (six) hours as needed for moderate pain.   Turmeric 500 MG CAPS Take 1,500 mg by mouth.   vitamin B-12 (CYANOCOBALAMIN) 500 MCG tablet Take 500 mcg by mouth daily after lunch.    No facility-administered encounter medications on file as of 05/31/2020.    Allergies (verified) Benazepril, Amlodipine, and Tetracycline   History: Past Medical History:  Diagnosis Date   ANEMIA-NOS 04/24/2007   Arthritis    Blood clot in vein    Cough    DIABETES MELLITUS, TYPE II 04/24/2007   Elevated glucose 2011   HYPERLIPIDEMIA 07/17/2007   mild   HYPERTENSION 04/24/2007   Knee pain    Menopause 1998   Dr. Gaetano Net   OBSTRUCTIVE SLEEP APNEA 09/30/2009   on CPAP 2011   Pneumonia    chronic Brochitis and use inhaler   VITAMIN B12 DEFICIENCY 04/24/2007   Past Surgical History:  Procedure Laterality Date   ABDOMINAL HYSTERECTOMY     HERNIA REPAIR  11/11   Incar. midline hernia   INCISIONAL HERNIA REPAIR N/A 06/03/2014   Procedure: LAPAROSCOPIC REPAIR RECURRENT  INCISIONAL HERNIA;  Surgeon: Georganna Skeans, MD;  Location: [facility];  Service: General;  Laterality: N/A;   INSERTION OF MESH N/A 06/03/2014   Procedure:  INSERTION OF MESH;  Surgeon: Georganna Skeans, MD;  Location: [facility];  Service: General;  Laterality: N/A;   TONSILLECTOMY     TOTAL KNEE ARTHROPLASTY Right 01/27/2019   Procedure: TOTAL KNEE ARTHROPLASTY;  Surgeon: Gaynelle Arabian, MD;  Location: [facility];  Service: Orthopedics;  Laterality: Right;  28min   Family History  Problem Relation Age of Onset   Heart disease Mother 57       ?CAD   Lung cancer Father    Heart disease Brother 45       CAD   Hypertension Other    Colon cancer Neg Hx    Esophageal cancer Neg Hx    Stomach cancer Neg Hx    Pancreatic cancer Neg Hx    Liver disease Neg Hx    Social History   Socioeconomic History   Marital status: Married    Spouse name: Carloyn Manner   Number of children: 2   Years of education: Not on file  Highest education level: Not on file  Occupational History   Occupation: Aeronautical engineer: [employer]  Tobacco Use   Smoking status: Never Smoker   Smokeless tobacco: Never Used  Vaping Use   Vaping Use: Never used  Substance and Sexual Activity   Alcohol use: No   Drug use: No   Sexual activity: Yes  Other Topics Concern   Not on file  Social History Narrative   Vegan   Social Determinants of Health   Financial Resource Strain: Low Risk    Difficulty of Paying Living Expenses: Not hard at all  Food Insecurity: No Food Insecurity   Worried About Charity fundraiser in the Last Year: Never true   [place] in the Last Year: Never true  Transportation Needs: No Transportation Needs   Lack of Transportation (Medical): No   Lack of Transportation (Non-Medical): No  Physical Activity: Sufficiently Active   Days of Exercise per Week: 5 days   Minutes of Exercise per Session: 60 min  Stress: No Stress Concern Present   Feeling of Stress : Not at all  Social Connections: Not on file    Tobacco Counseling Counseling given: Not Answered   Clinical Intake:  Pre-visit preparation completed: Yes  Pain : No/denies  pain     Nutritional Risks: None Diabetes: Yes CBG done?: No Did pt. bring in CBG monitor from home?: No  How often do you need to have someone help you when you read instructions, pamphlets, or other written materials from your doctor or pharmacy?: 1 - Never What is the last grade level you completed in school?: Bachelor's Degree in Accounting  Diabetic? yes  Interpreter Needed?: No  Information entered by :: Lisette Abu, LPN   Activities of Daily Living In your present state of health, do you have any difficulty performing the following activities: 05/31/2020  Hearing? N  Vision? N  Difficulty concentrating or making decisions? N  Walking or climbing stairs? N  Dressing or bathing? N  Doing errands, shopping? N  Preparing Food and eating ? N  Using the Toilet? N  In the past six months, have you accidently leaked urine? N  Do you have problems with loss of bowel control? N  Managing your Medications? N  Managing your Finances? N  Some recent data might be hidden    Patient Care Team: Plotnikov, [name], MD as PCP - Gaston Islam, MD as Consulting Physician (Orthopedic Surgery) [name], MD as Consulting Physician (Pulmonary Disease)  Indicate any recent Medical Services you may have received from other than [facility] providers in the past year (date may be approximate).     Assessment:   This is a routine wellness examination for Ellis.  Hearing/Vision screen No exam data present  Dietary issues and exercise activities discussed: Current Exercise Habits: Structured exercise class, Type of exercise: Other - see comments (water aerobics 5 x week), Time (Minutes): 60, Frequency (Times/Week): 5, Weekly Exercise (Minutes/Week): 300, Intensity: Moderate, Exercise limited by: orthopedic condition(s);cardiac condition(s)  Goals       Patient Stated (pt-stated)      My goal is to lose 50 pounds within a year.       Depression Screen PHQ 2/9  Scores 05/31/2020 12/31/2019 03/27/2019 02/20/2018 12/22/2016 05/18/2015 05/11/2015  PHQ - 2 Score 0 1 0 0 0 0 0  PHQ- 9 Score - 3 - - - - -    Fall Risk Fall Risk  05/31/2020 02/20/2018 05/18/2015 05/11/2015  Falls in the past year? 0 No No No  Number falls in past yr: 0 - - -  Injury with Fall? 0 - - -  Risk for fall due to : No Fall Risks - - -  Follow up Falls evaluation completed - - -    FALL RISK PREVENTION PERTAINING TO THE HOME:  Any stairs in or around the home? No  If so, are there any without handrails? No  Home free of loose throw rugs in walkways, pet beds, electrical cords, etc? Yes  Adequate lighting in your home to reduce risk of falls? Yes   ASSISTIVE DEVICES UTILIZED TO PREVENT FALLS:  Life alert? No  Use of a cane, walker or w/c? No  Grab bars in the bathroom? No  Shower chair or bench in shower? No  Elevated toilet seat or a handicapped toilet? No   TIMED UP AND GO:  Was the test performed? No .  Length of time to ambulate 10 feet: 0 sec.   Gait steady and fast without use of assistive device  Cognitive Function: Normal cognitive status assessed by direct observation by this Nurse Health Advisor. No abnormalities found.         Immunizations Immunization History  Administered Date(s) Administered   Fluad Quad(high Dose 65+) 03/22/2019, 05/10/2020   Influenza, High Dose Seasonal PF 08/19/2018   Influenza-Unspecified 04/02/2017   PFIZER SARS-COV-2 Vaccination 07/24/2019, 08/14/2019, 03/22/2020   Pneumococcal Conjugate-13 05/07/2019   Tdap 05/07/2019   Zoster Recombinat (Shingrix) 06/05/2017, 08/10/2017    TDAP status: Up to date  Flu Vaccine status: Up to date  Pneumococcal vaccine status: Due, Education has been provided regarding the importance of this vaccine. Advised may receive this vaccine at local pharmacy or Health Dept. Aware to provide a copy of the vaccination record if obtained from local pharmacy or Health Dept. Verbalized acceptance and  understanding.  Covid-19 vaccine status: Completed vaccines  Qualifies for Shingles Vaccine? Yes   Zostavax completed Yes   Shingrix Completed?: Yes  Screening Tests Health Maintenance  Topic Date Due   MAMMOGRAM  11/17/2013   FOOT EXAM  01/08/2014   DEXA SCAN  Never done   OPHTHALMOLOGY EXAM  06/05/2018   PNA vac Low Risk Adult (2 of 2 - PPSV23) 05/06/2020   HEMOGLOBIN A1C  11/07/2020   COLONOSCOPY  11/17/2028   TETANUS/TDAP  05/06/2029   INFLUENZA VACCINE  Completed   COVID-19 Vaccine  Completed   Hepatitis C Screening  Completed    Health Maintenance  Health Maintenance Due  Topic Date Due   MAMMOGRAM  11/17/2013   FOOT EXAM  01/08/2014   DEXA SCAN  Never done   OPHTHALMOLOGY EXAM  06/05/2018   PNA vac Low Risk Adult (2 of 2 - PPSV23) 05/06/2020    Colorectal cancer screening: Type of screening: Cologuard. Completed 11/18/2018. Repeat every 33 years  Mammogram status: Completed 2021 (per patientat OB/GYN). Repeat every year  Bone Density status: Completed per patient in 2021 with OB/GYN office. Results reflect: Bone density results: NORMAL. Repeat every 5 years.  Lung Cancer Screening: (Low Dose CT Chest recommended if Age 22-80 years, 30 pack-year currently smoking OR have quit w/in 15years.) does not qualify.   Lung Cancer Screening Referral: no  Additional Screening:  Hepatitis C Screening: does qualify; Completed yes  Vision Screening: Recommended annual ophthalmology exams for early detection of glaucoma and other disorders of the eye. Is the patient up to date with their annual  eye exam?  Yes  Who is the provider or what is the name of the office in which the patient attends annual eye exams? [facility] If pt is not established with a provider, would they like to be referred to a provider to establish care? No .   Dental Screening: Recommended annual dental exams for proper oral hygiene  Community Resource Referral / Chronic Care Management: CRR  required this visit?  No   CCM required this visit?  No      Plan:     I have personally reviewed and noted the following in the patient's chart:   Medical and social history Use of alcohol, tobacco or illicit drugs  Current medications and supplements Functional ability and status Nutritional status Physical activity Advanced directives List of other physicians Hospitalizations, surgeries, and ER visits in previous 12 months Vitals Screenings to include cognitive, depression, and falls Referrals and appointments  In addition, I have reviewed and discussed with patient certain preventive protocols, quality metrics, and best practice recommendations. A written personalized care plan for preventive services as well as general preventive health recommendations were provided to patient.     Sheral Flow, LPN   [id number]   Nurse Notes:  Patient is cogitatively intact. There were no vitals filed for this visit. There is no height or weight on file to calculate BMI. Patient stated that she has no issues with gait or balance; does not use any assistive devices.   Medical screening examination/treatment/procedure(s) were performed by non-physician practitioner and as supervising physician I was immediately available for consultation/collaboration.  I agree with above. Lew Dawes, MD

## 2020-06-04 MED FILL — PRAVASTATIN SODIUM 20 MG TA: 20 | 90 days supply | Qty: 90 | Fill #0

## 2020-06-05 DIAGNOSIS — Z20822 Contact with and (suspected) exposure to covid-19: Secondary | ICD-10-CM | POA: Diagnosis not present

## 2020-06-11 ENCOUNTER — Other Ambulatory Visit: Payer: Self-pay | Admitting: Internal Medicine

## 2020-06-20 IMAGING — DX DG CHEST 2V
2 series · 2 of 2 positions shown · non-contrast
Comparison: 05/27/2014

CLINICAL DATA: Cough x2 weeks, fever

EXAM:
CHEST - 2 VIEW

[chest pa]
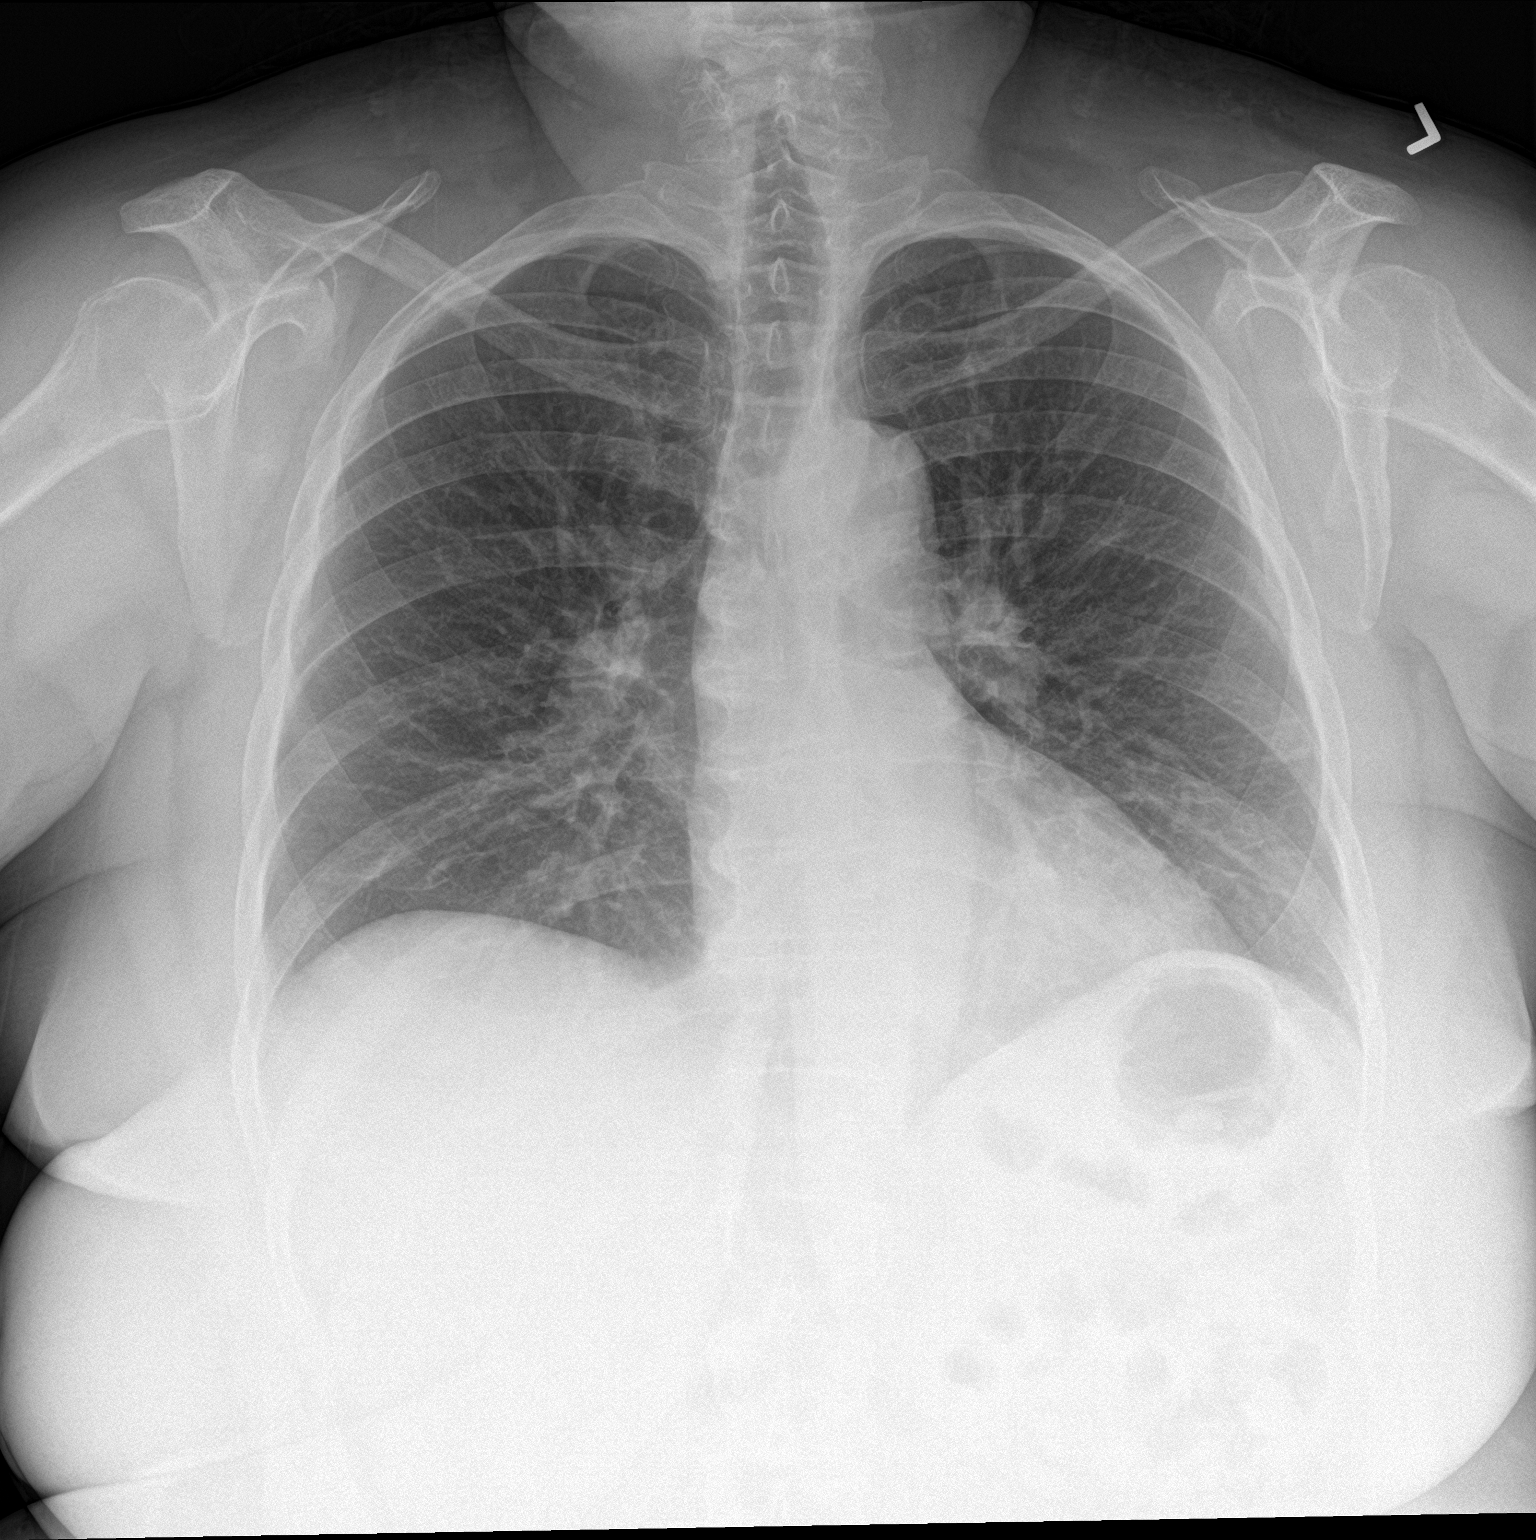

[chest lat]
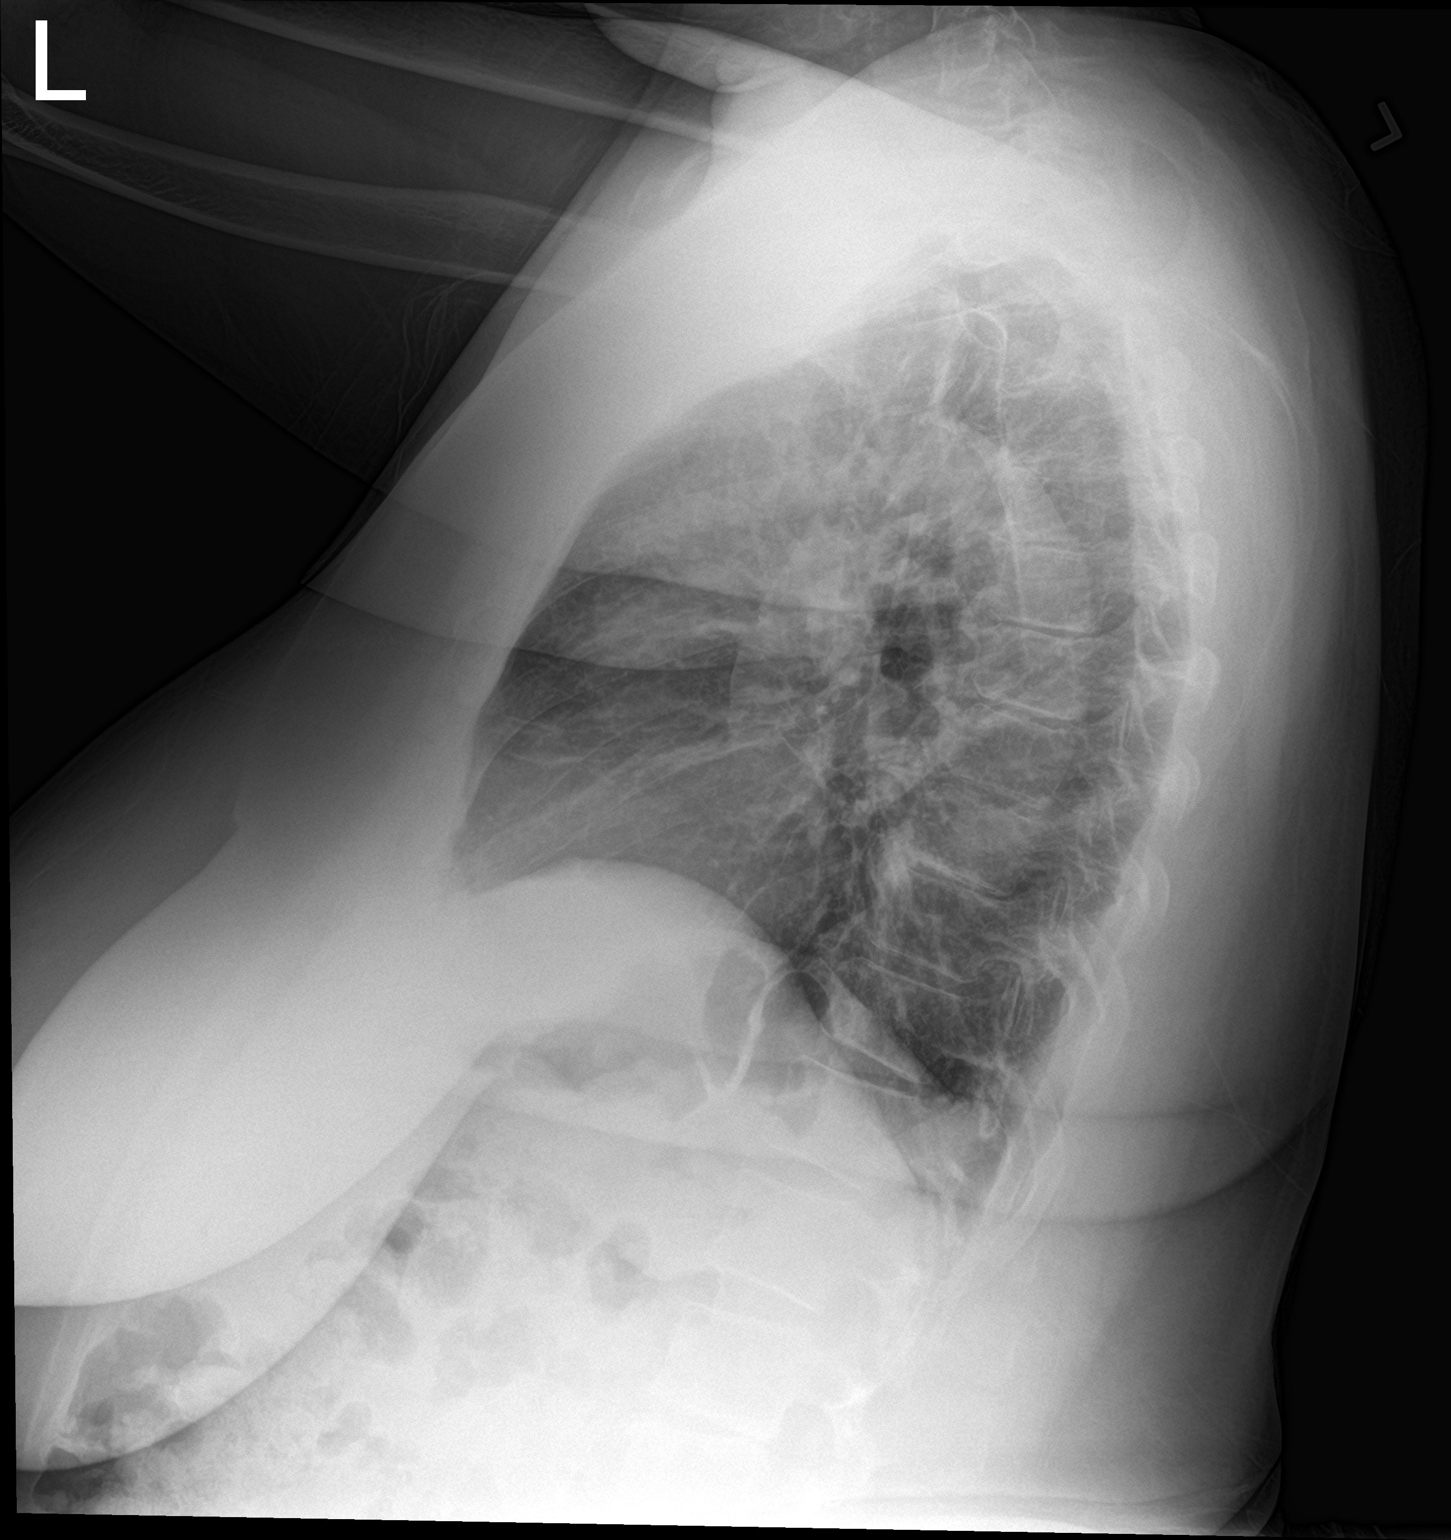

[2 of 2 positions shown; findings below may reference images not displayed]

FINDINGS: Lungs are clear.  No pleural effusion or pneumothorax.

The heart is normal in size.

Degenerative changes of the visualized thoracolumbar spine.
IMPRESSION: Normal chest radiographs.

## 2020-07-16 IMAGING — DX DG CHEST 2V
2 series · 2 of 2 positions shown · non-contrast
Comparison: 02/04/2018

CLINICAL DATA: Persistent cough and low-grade fever for 3 weeks,
diabetes mellitus, hypertension

EXAM:
CHEST - 2 VIEW

[chest pa]
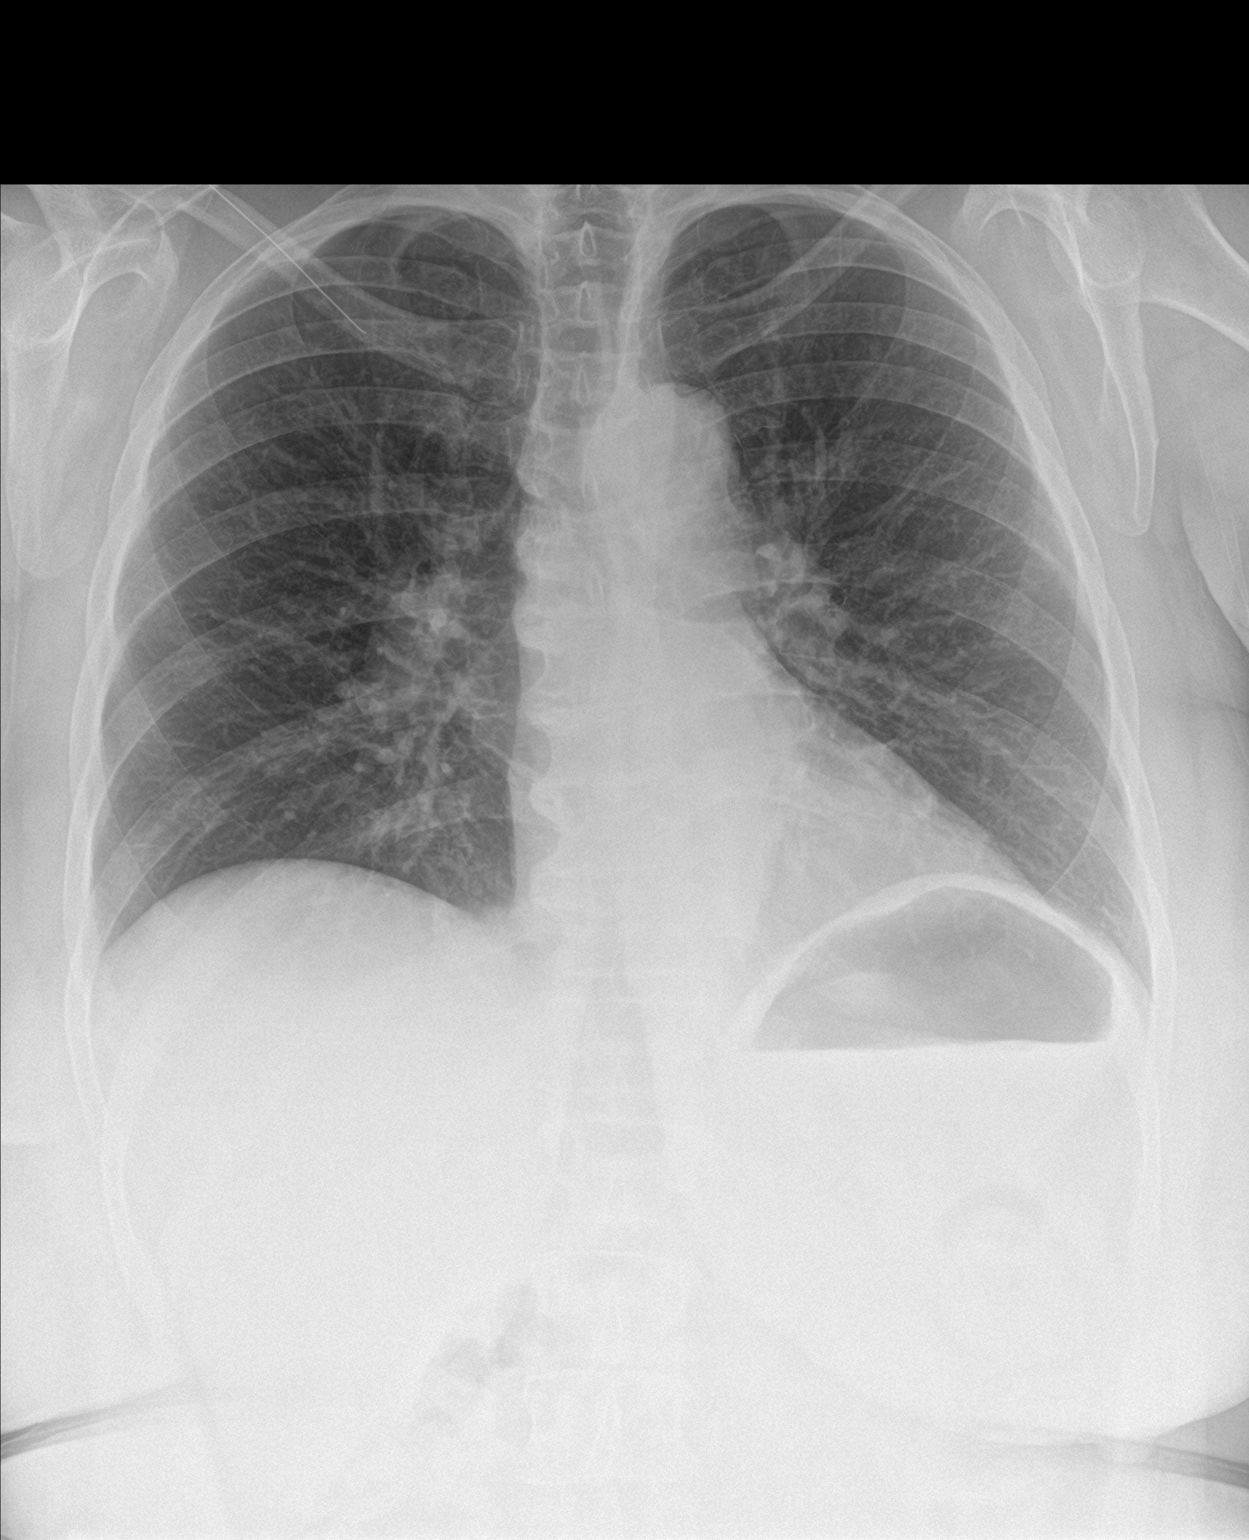

[chest lat]
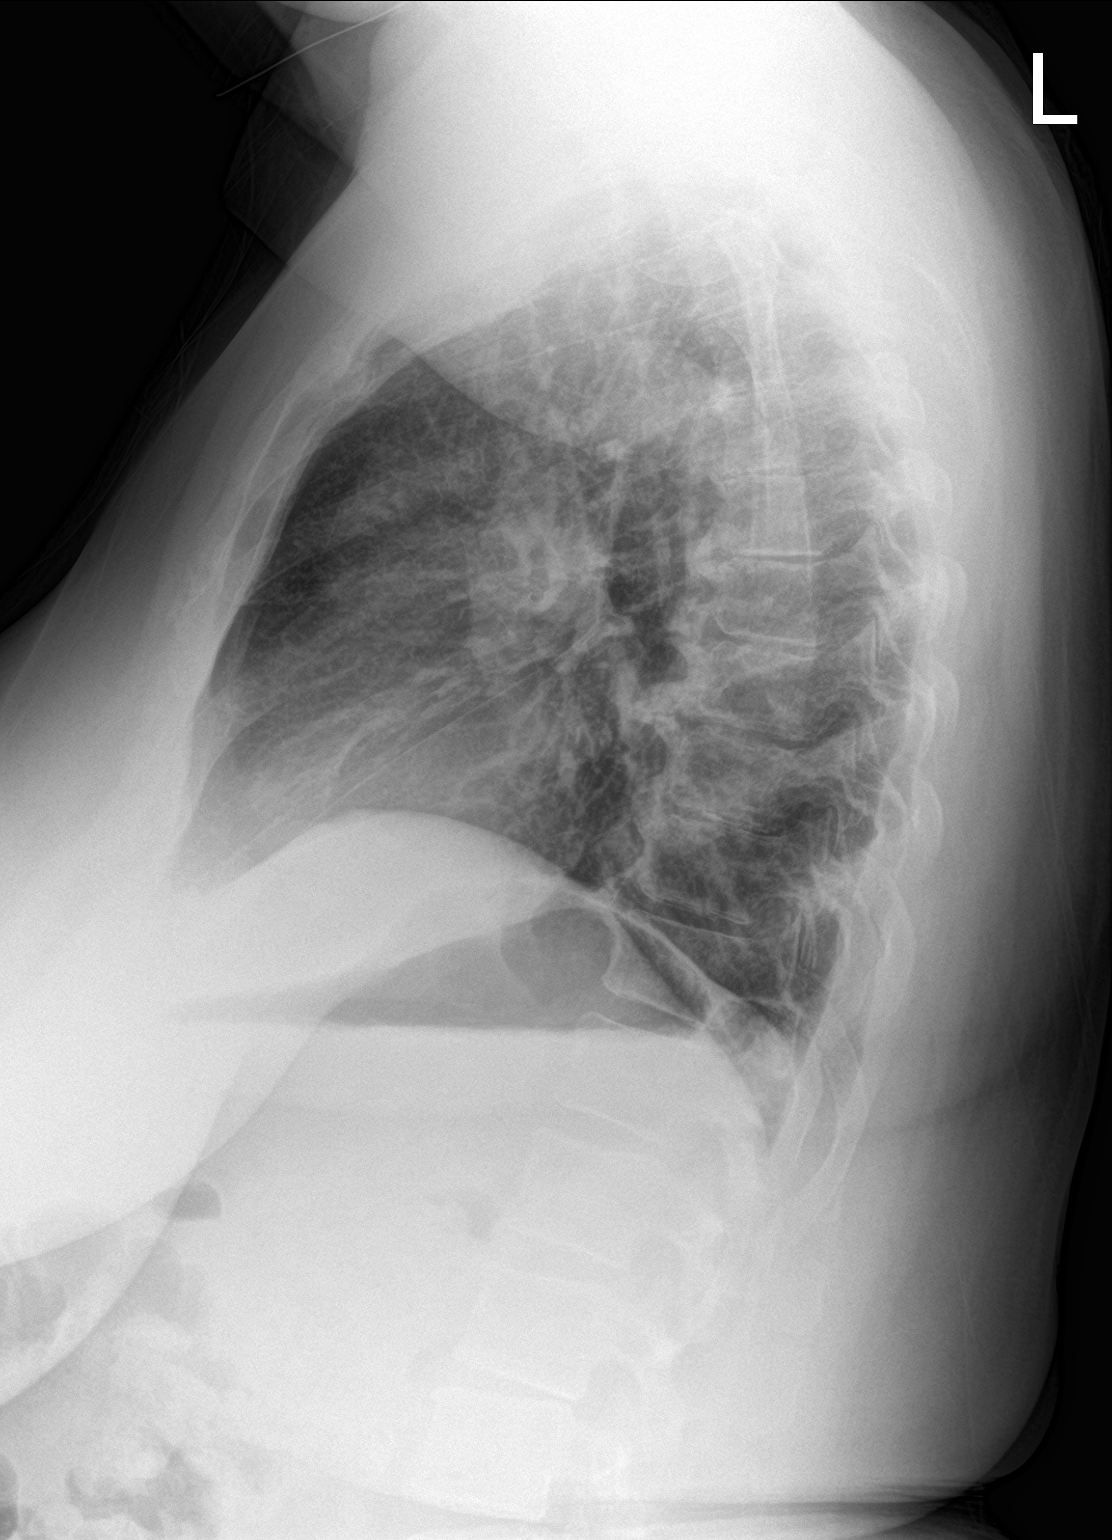

[2 of 2 positions shown; findings below may reference images not displayed]

FINDINGS: Normal heart size, mediastinal contours, and pulmonary vascularity.

Lungs clear.

No pulmonary infiltrate, pleural effusion or pneumothorax.

Multilevel endplate spur formation thoracic spine.
IMPRESSION: No acute abnormalities.

## 2020-08-16 IMAGING — DX DG CHEST 2V
2 series · 2 of 2 positions shown · non-contrast
Comparison: CT scan of the chest February 27, 2018 and chest
x-ray February 15, 2018

CLINICAL DATA: Cough and shortness of breath for the past 2 months.
History of diabetes and hypertension. Previous pulmonary embolism.
Nonsmoker.

EXAM:
CHEST - 2 VIEW

[chest pa]
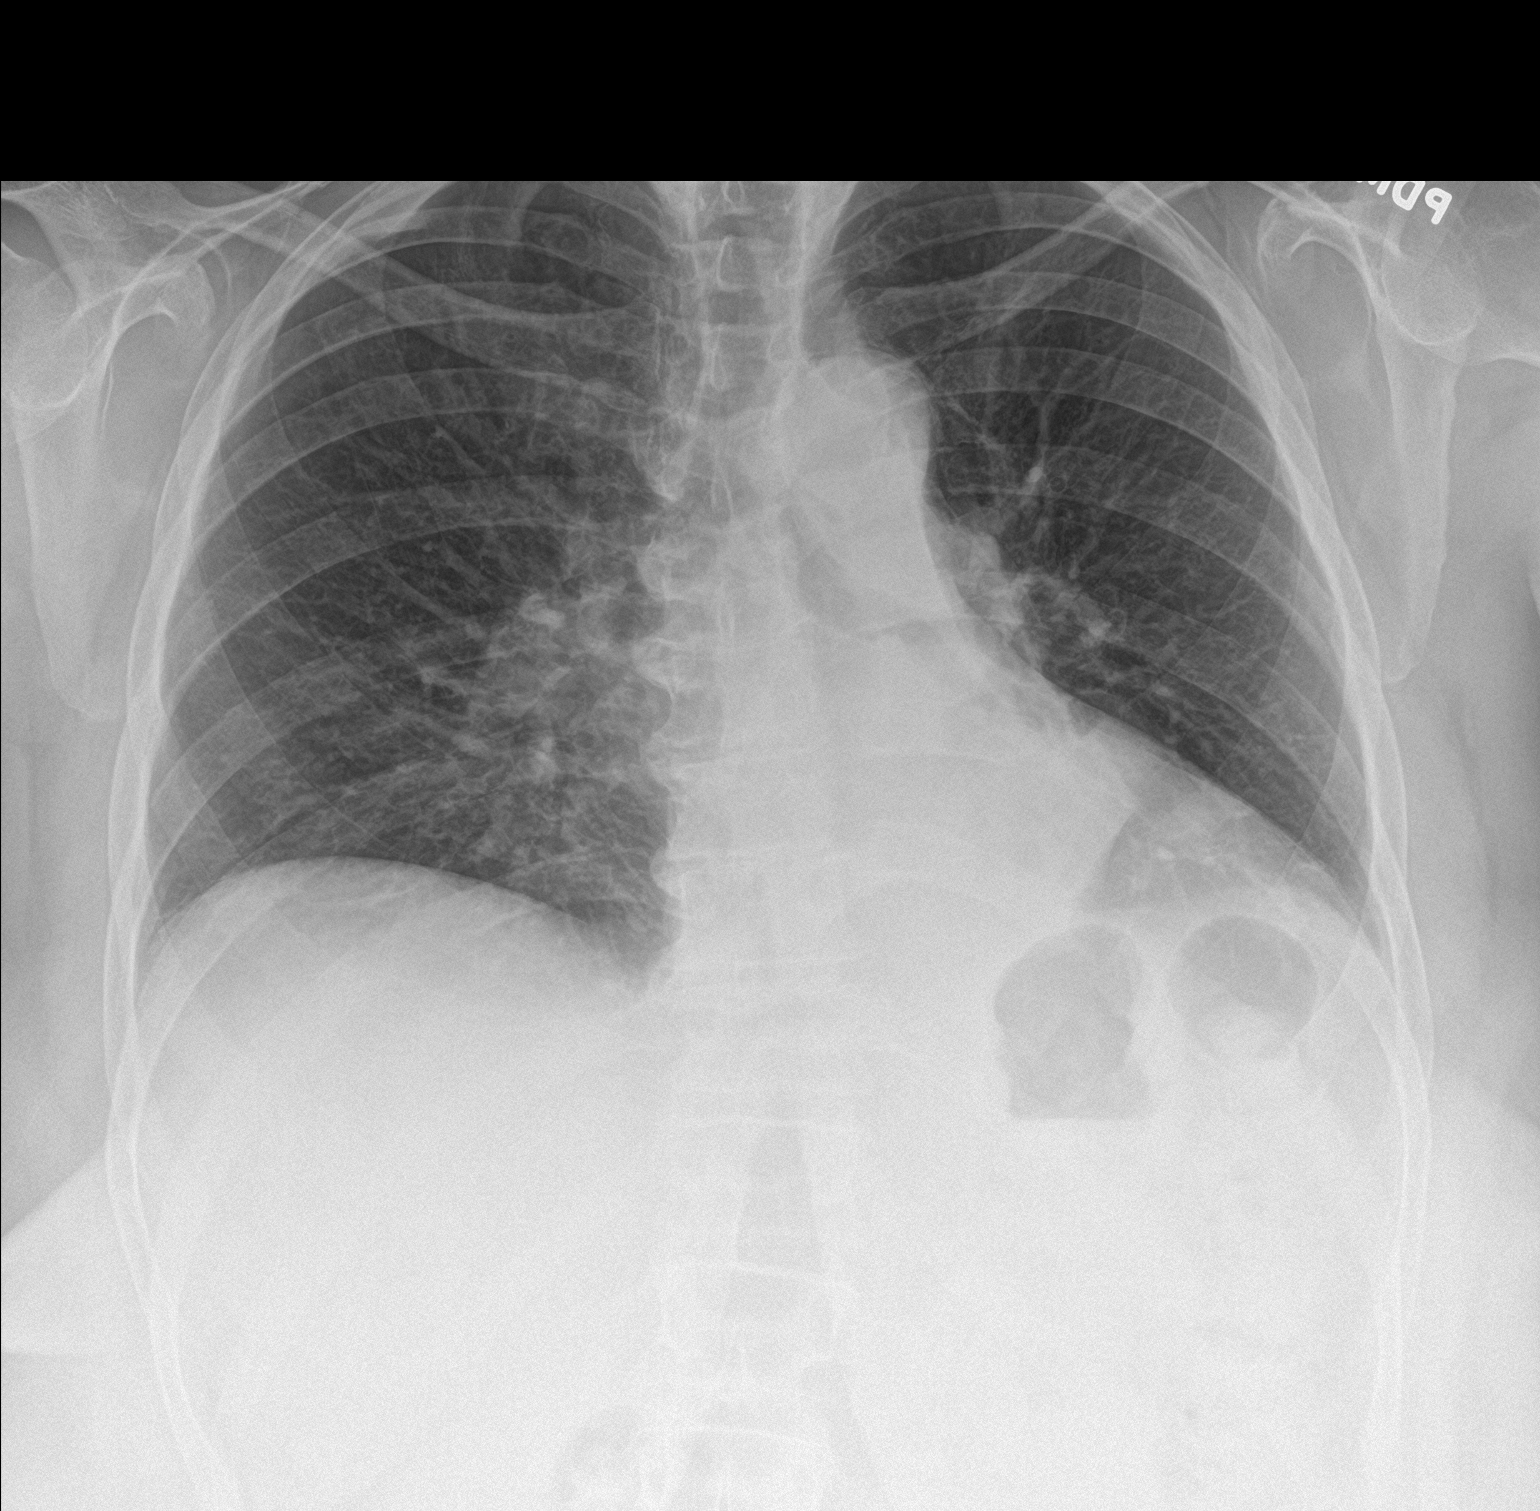

[chest lat]
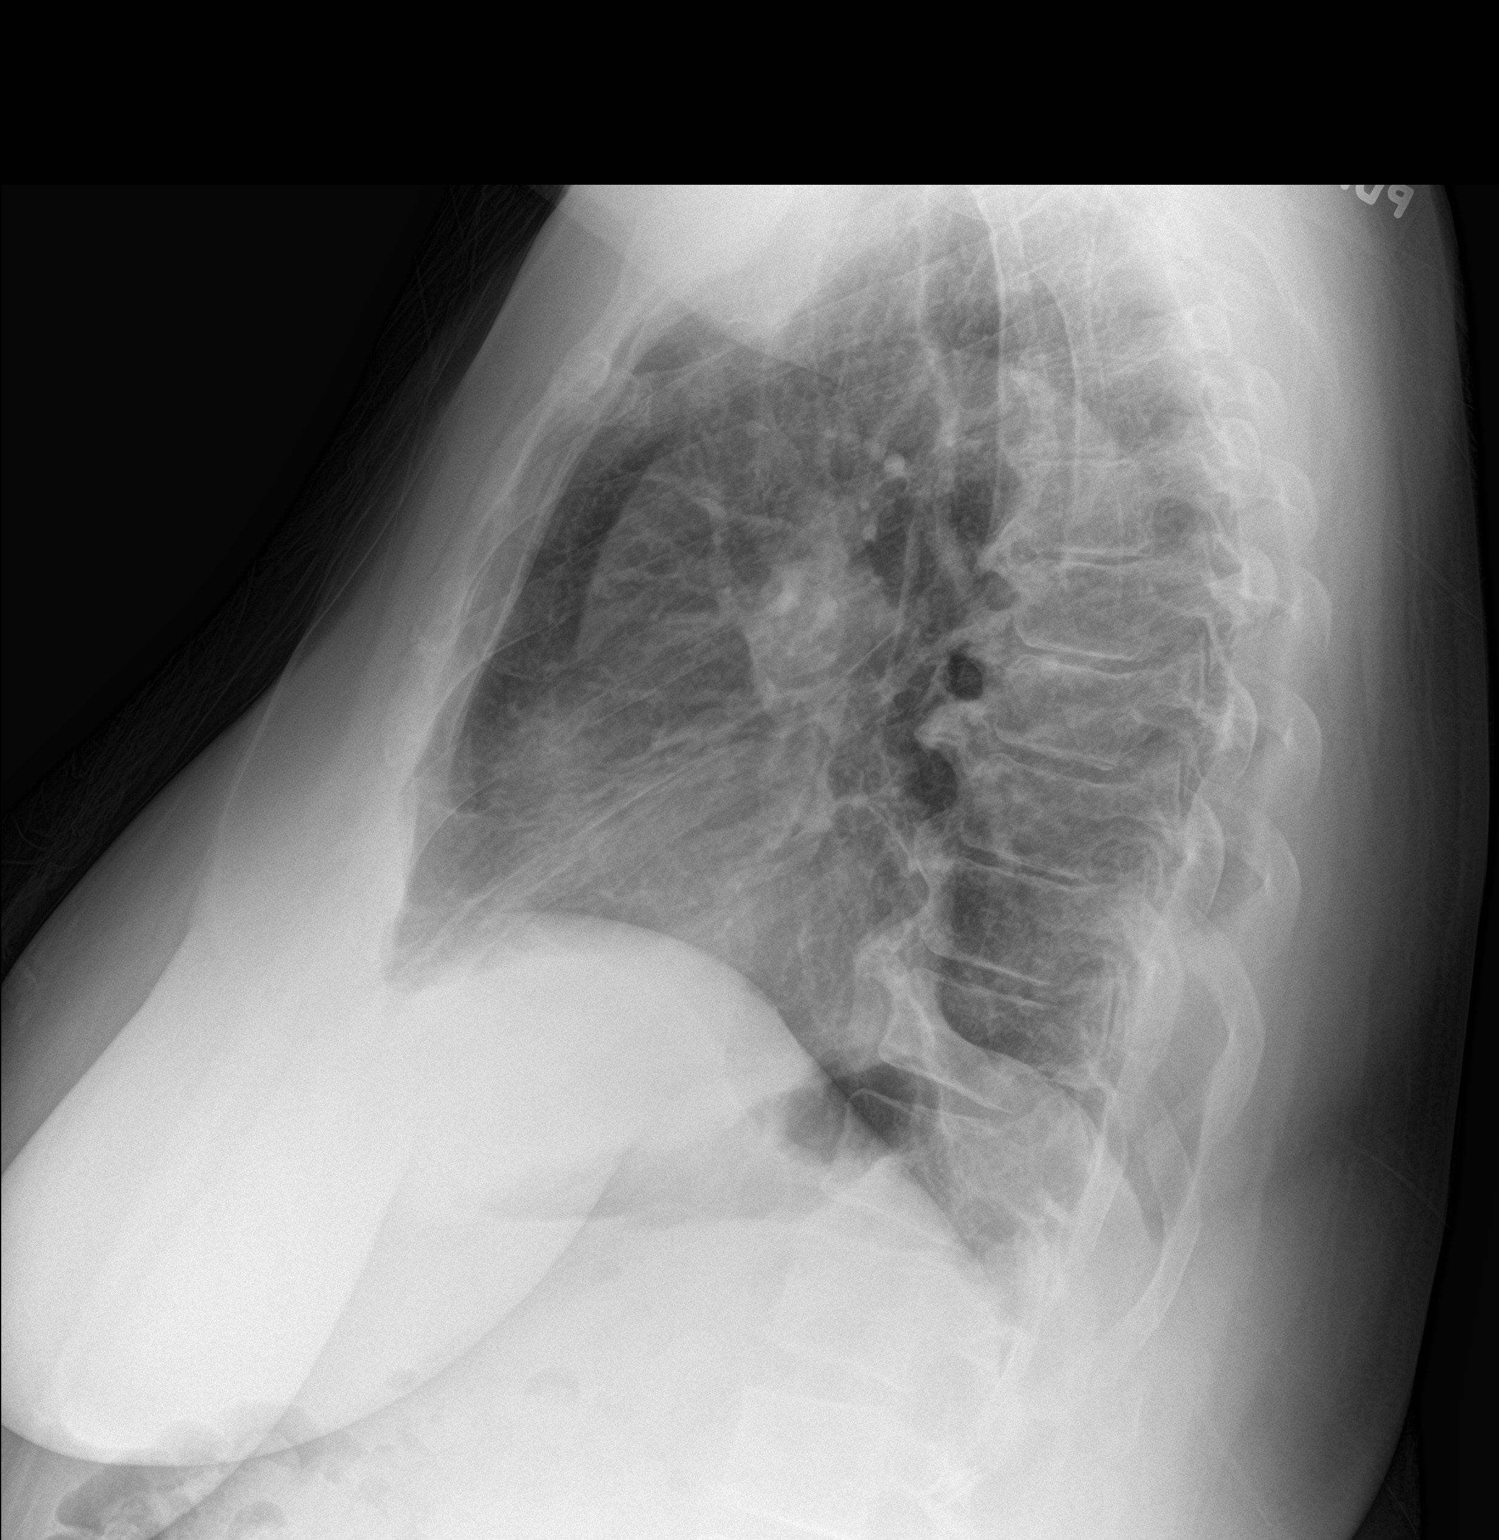

[2 of 2 positions shown; findings below may reference images not displayed]

FINDINGS: There is dense infiltrate in the left lower lobe which is new. The
left upper lobe and the right lung are clear. The heart is normal in
size. The pulmonary vascularity is not engorged. There is
calcification in the wall of the aortic arch. There is multilevel
degenerative disc disease of the thoracic spine.
IMPRESSION: Increased density in the left retrocardiac region consistent with
pneumonia similar to that seen on the CT scan of approximately 3
weeks ago. No CHF. Followup PA and lateral chest X-ray is
recommended in 3-4 weeks following trial of antibiotic therapy to
ensure resolution and exclude underlying malignancy.

Thoracic aortic atherosclerosis.

## 2020-08-26 DIAGNOSIS — Z20822 Contact with and (suspected) exposure to covid-19: Secondary | ICD-10-CM | POA: Diagnosis not present

## 2020-09-05 DIAGNOSIS — Z20822 Contact with and (suspected) exposure to covid-19: Secondary | ICD-10-CM | POA: Diagnosis not present

## 2020-10-30 DIAGNOSIS — U071 COVID-19: Secondary | ICD-10-CM | POA: Diagnosis not present

## 2020-11-05 ENCOUNTER — Other Ambulatory Visit: Payer: Self-pay | Admitting: Internal Medicine

## 2020-11-18 DIAGNOSIS — M1712 Unilateral primary osteoarthritis, left knee: Secondary | ICD-10-CM | POA: Diagnosis not present

## 2020-11-23 DIAGNOSIS — U071 COVID-19: Secondary | ICD-10-CM | POA: Diagnosis not present

## 2020-11-25 ENCOUNTER — Telehealth: Payer: Self-pay | Admitting: Internal Medicine

## 2020-11-25 NOTE — Telephone Encounter (Signed)
Called pt to schedule surgical clearance appointment

## 2020-11-30 ENCOUNTER — Other Ambulatory Visit: Payer: Medicare Other

## 2020-11-30 ENCOUNTER — Inpatient Hospital Stay: Payer: Medicare Other | Attending: Hematology and Oncology

## 2020-11-30 ENCOUNTER — Ambulatory Visit: Payer: Medicare Other | Admitting: Hematology and Oncology

## 2020-11-30 ENCOUNTER — Telehealth: Payer: Self-pay

## 2020-11-30 ENCOUNTER — Inpatient Hospital Stay: Payer: Medicare Other | Admitting: Hematology and Oncology

## 2020-11-30 DIAGNOSIS — Z7901 Long term (current) use of anticoagulants: Secondary | ICD-10-CM | POA: Insufficient documentation

## 2020-11-30 DIAGNOSIS — N183 Chronic kidney disease, stage 3 unspecified: Secondary | ICD-10-CM | POA: Insufficient documentation

## 2020-11-30 DIAGNOSIS — Z86711 Personal history of pulmonary embolism: Secondary | ICD-10-CM | POA: Insufficient documentation

## 2020-11-30 NOTE — Telephone Encounter (Signed)
Called and left a message asking to call the office to reschedule lab and appt with Dr. Alvy Bimler that she missed today.

## 2020-12-02 ENCOUNTER — Telehealth: Payer: Self-pay

## 2020-12-02 ENCOUNTER — Encounter: Payer: Self-pay | Admitting: Hematology and Oncology

## 2020-12-02 NOTE — Telephone Encounter (Signed)
She called and left a message to reschedule missed appts.  Called back and rescheduled appts on 6/23. She is aware of appt times.

## 2020-12-08 ENCOUNTER — Ambulatory Visit: Payer: BLUE CROSS/BLUE SHIELD | Admitting: Internal Medicine

## 2020-12-09 ENCOUNTER — Inpatient Hospital Stay: Payer: Medicare Other

## 2020-12-09 ENCOUNTER — Encounter: Payer: Self-pay | Admitting: Hematology and Oncology

## 2020-12-09 ENCOUNTER — Inpatient Hospital Stay (HOSPITAL_BASED_OUTPATIENT_CLINIC_OR_DEPARTMENT_OTHER): Payer: Medicare Other | Admitting: Hematology and Oncology

## 2020-12-09 ENCOUNTER — Other Ambulatory Visit: Payer: Self-pay

## 2020-12-09 DIAGNOSIS — Z86711 Personal history of pulmonary embolism: Secondary | ICD-10-CM

## 2020-12-09 DIAGNOSIS — Z7901 Long term (current) use of anticoagulants: Secondary | ICD-10-CM | POA: Diagnosis not present

## 2020-12-09 DIAGNOSIS — E1169 Type 2 diabetes mellitus with other specified complication: Secondary | ICD-10-CM

## 2020-12-09 DIAGNOSIS — I2699 Other pulmonary embolism without acute cor pulmonale: Secondary | ICD-10-CM

## 2020-12-09 DIAGNOSIS — E669 Obesity, unspecified: Secondary | ICD-10-CM | POA: Diagnosis not present

## 2020-12-09 DIAGNOSIS — N183 Chronic kidney disease, stage 3 unspecified: Secondary | ICD-10-CM

## 2020-12-09 DIAGNOSIS — Z23 Encounter for immunization: Secondary | ICD-10-CM | POA: Diagnosis not present

## 2020-12-09 LAB — CBC WITH DIFFERENTIAL/PLATELET
Abs Immature Granulocytes: 0.01 10*3/uL (ref 0.00–0.07)
Basophils Absolute: 0 10*3/uL (ref 0.0–0.1)
Basophils Relative: 0 %
Eosinophils Absolute: 0.1 10*3/uL (ref 0.0–0.5)
Eosinophils Relative: 1 %
HCT: 37.2 % (ref 36.0–46.0)
Hemoglobin: 12.3 g/dL (ref 12.0–15.0)
Immature Granulocytes: 0 %
Lymphocytes Relative: 26 %
Lymphs Abs: 1.9 10*3/uL (ref 0.7–4.0)
MCH: 28.5 pg (ref 26.0–34.0)
MCHC: 33.1 g/dL (ref 30.0–36.0)
MCV: 86.3 fL (ref 80.0–100.0)
Monocytes Absolute: 0.6 10*3/uL (ref 0.1–1.0)
Monocytes Relative: 8 %
Neutro Abs: 4.7 10*3/uL (ref 1.7–7.7)
Neutrophils Relative %: 65 %
Platelets: 298 10*3/uL (ref 150–400)
RBC: 4.31 MIL/uL (ref 3.87–5.11)
RDW: 13.9 % (ref 11.5–15.5)
WBC: 7.3 10*3/uL (ref 4.0–10.5)
nRBC: 0 % (ref 0.0–0.2)

## 2020-12-09 LAB — BASIC METABOLIC PANEL - CANCER CENTER ONLY
Anion gap: 9 (ref 5–15)
BUN: 11 mg/dL (ref 8–23)
CO2: 24 mmol/L (ref 22–32)
Calcium: 9.8 mg/dL (ref 8.9–10.3)
Chloride: 106 mmol/L (ref 98–111)
Creatinine: 1.09 mg/dL — ABNORMAL HIGH (ref 0.44–1.00)
GFR, Estimated: 56 mL/min — ABNORMAL LOW (ref >60)
Glucose, Bld: 114 mg/dL — ABNORMAL HIGH (ref 70–99)
Potassium: 4.7 mmol/L (ref 3.5–5.1)
Sodium: 139 mmol/L (ref 135–145)

## 2020-12-09 MED ORDER — APIXABAN 5 MG PO TABS: ORAL_TABLET | Freq: Two times a day (BID) | ORAL | 3 refills | Status: DC

## 2020-12-09 NOTE — Assessment & Plan Note (Signed)
Her knee surgery was canceled due to class III obesity The patient does not appear to have good strategy to lose weight I have discontinue all her unnecessary supplements, except for vitamin B12, vitamin D and calcium supplement I warned her about using fish oil in the setting of chronic anticoagulation therapy because that could predispose to with risk of bleeding We have extensive discussions about dietary modification and strategies to help her lose weight I will call her next month to check on her progress She appears highly motivated to lose weight

## 2020-12-09 NOTE — Assessment & Plan Note (Signed)
She had history of pulmonary embolism appears to be unprovoked The patient have other risk factors that could predispose her to recurrent thromboembolism For now, she will continue anticoagulation therapy indefinitely

## 2020-12-09 NOTE — Progress Notes (Signed)
Sierra Johnson OFFICE PROGRESS NOTE  Plotnikov, [name], MD  ASSESSMENT & PLAN:  History of pulmonary embolus (PE) She had history of pulmonary embolism appears to be unprovoked The patient have other risk factors that could predispose her to recurrent thromboembolism For now, she will continue anticoagulation therapy indefinitely  CKD (chronic kidney disease) stage 3, GFR 30-59 ml/min She has mild chronic kidney disease likely secondary to dehydration and other risk factors such as diabetes and hypertension We discussed the importance of dietary modification I recommend her to drink more fluid With her current calculated creatinine clearance, there is no contraindication for her to continue on Eliquis  Morbid obesity ([place]) Her knee surgery was canceled due to class III obesity The patient does not appear to have good strategy to lose weight I have discontinue all her unnecessary supplements, except for vitamin B12, vitamin D and calcium supplement I warned her about using fish oil in the setting of chronic anticoagulation therapy because that could predispose to with risk of bleeding We have extensive discussions about dietary modification and strategies to help her lose weight I will call her next month to check on her progress She appears highly motivated to lose weight  No orders of the defined types were placed in this encounter.   The total time spent in the appointment was 20 minutes encounter with patients including review of chart and various tests results, discussions about plan of care and coordination of care plan   All questions were answered. The patient knows to call the clinic with any problems, questions or concerns. No barriers to learning was detected.    [name], MD 6/23/20228:58 AM  INTERVAL HISTORY: Sierra Johnson 70 y.o. female returns for preop surgical clearance for knee surgery, ongoing chronic anticoagulation therapy The patient told  me that her surgery was canceled due to her weight situation She is unsure how she could lose weight She denies recent bleeding complications from anticoagulation therapy The patient eats vegetarian diet which is predominantly starches and fiber with little protein She exercises in the pool about an hour per day  SUMMARY OF HEMATOLOGIC HISTORY: Sierra Johnson was transferred to my care after her prior physician has left.  I reviewed the patient's records extensive and collaborated the history with the patient. Summary of her history is as follows: Summary of Hematological History Patient reports that starting in early August 2019, she began to develop new onset cough and sinus symptoms, associated with low-grade fever, for which she was prescribed 3 different courses of antibiotics without improvement.  In early September 2019, she also developed new onset shortness of breath as well as chest wall soreness from repetitive coughing.  She did not have any lower extremity swelling, warmth, tenderness or erythema.  Her PCP ordered a CT angiogram of the chest, which showed a right upper lobe PTE, for which she was sent to the ER for further management.  While in the hospital, patient was initially started on heparin drip, and the transition to Eliquis at discharge.  She denied any extended travel, being on hormone replacement therapy, or hospitalization/surgery prior to her diagnosis of VTE.  There is no personal or family history of malignancy.  Patient's mother had a blood clot after hip surgery, but there is no other family member with history of VTE. The patient had sedentary lifestyle She has class III obesity with diabetes Between 2019-2021, she is maintained on anticoagulation therapy but after a year of anticoagulation treatment,  she is on low-dose preventative maintenance Eliquis  She is retired She is not able to mobilize much due to bilateral knee pain She struggle a lot with her weight She  has several medication adjustments since she was last seen here She denies complications from diabetes such as peripheral neuropathy She was prescribed gabapentin for a while for unrelated reason but is no longer taking it She continues to have occasional cough and shortness of breath The patient denies any recent signs or symptoms of bleeding such as spontaneous epistaxis, hematuria or hematochezia.   I have reviewed the past medical history, past surgical history, social history and family history with the patient and they are unchanged from previous note.  ALLERGIES:  is allergic to benazepril, amlodipine, and tetracycline.  MEDICATIONS:  Current Outpatient Medications  Medication Sig Dispense Refill   Accu-Chek FastClix Lancets MISC USE TO CHECK BLOOD SUGARS TWICE A DAY 102 each 0   albuterol (VENTOLIN HFA) 108 (90 Base) MCG/ACT inhaler Inhale 1-2 puffs into the lungs every 6 (six) hours as needed for wheezing or shortness of breath. 8 g 0   apixaban (ELIQUIS) 5 MG TABS tablet TAKE 1 TABLET BY MOUTH TWICE DAILY 90 tablet 3   Blood Glucose Monitoring Suppl (ACCU-CHEK GUIDE) w/Device KIT 1 each by Does not apply route 2 (two) times daily. DX: E11.9 1 kit 0   CALCIUM PO Take 1,000 mg by mouth daily after lunch.      Cholecalciferol (VITAMIN D3) 50 MCG (2000 UT) capsule Take 1 capsule (2,000 Units total) by mouth daily. 100 capsule 3   fluticasone (FLONASE) 50 MCG/ACT nasal spray Place 2 sprays into both nostrils daily. (Patient taking differently: Place 2 sprays into both nostrils daily as needed for allergies. ) 16 g 6   glucose blood (ACCU-CHEK GUIDE) test strip UUSE TO CHECK BLOOD SUGARS TWICE A DAY 200 strip 1   hydrochlorothiazide (MICROZIDE) 12.5 MG capsule Take 1 capsule by mouth once daily 30 capsule 1   Insulin Glargine, 1 Unit Dial, (TOUJEO SOLOSTAR) 300 UNIT/ML SOPN 18 Units by Subconjunctival route daily as needed (for blood sugars greater than 120). 3 pen 3   metFORMIN  (GLUCOPHAGE) 500 MG tablet TAKE 1 TABLET 2 TIMES DAILY WITH A MEAL. ANNUAL APPT IS DUE W/LABS MUST SEE PROVIDER REFILLS 60 tablet 11   olmesartan (BENICAR) 20 MG tablet TAKE 1 TABLET (20 MG TOTAL) BY MOUTH DAILY. 90 tablet 3   polyethylene glycol powder (GLYCOLAX/MIRALAX) 17 GM/SCOOP powder polyethylene glycol 3350 17 gram oral powder packet  TAKE 17 GRAMS BY MOUTH TWICE DAILY     pravastatin (PRAVACHOL) 20 MG tablet TAKE 1 TABLET BY MOUTH EVERY DAY 90 tablet 3   Respiratory Therapy Supplies (NEBULIZER/TUBING/MOUTHPIECE) KIT every 4-6 hours 1 each 3   sodium chloride HYPERTONIC 3 % nebulizer solution Take by nebulization as needed for other. Take twice daily 60 mL 5   traMADol (ULTRAM) 50 MG tablet Take 1-2 tablets (50-100 mg total) by mouth every 6 (six) hours as needed for moderate pain. 40 tablet 0   Turmeric 500 MG CAPS Take 1,500 mg by mouth.     vitamin B-12 (CYANOCOBALAMIN) 500 MCG tablet Take 500 mcg by mouth daily after lunch.      No current facility-administered medications for this visit.     REVIEW OF SYSTEMS:   Constitutional: Denies fevers, chills or night sweats Eyes: Denies blurriness of vision Ears, nose, mouth, throat, and face: Denies mucositis or sore throat Respiratory: Denies cough, dyspnea  or wheezes Cardiovascular: Denies palpitation, chest discomfort or lower extremity swelling Gastrointestinal:  Denies nausea, heartburn or change in bowel habits Skin: Denies abnormal skin rashes Lymphatics: Denies new lymphadenopathy or easy bruising Neurological:Denies numbness, tingling or new weaknesses Behavioral/Psych: Mood is stable, no new changes  All other systems were reviewed with the patient and are negative.  PHYSICAL EXAMINATION: ECOG PERFORMANCE STATUS: 1 - Symptomatic but completely ambulatory  Vitals:   12/09/20 0830  BP: 121/87  Pulse: 68  Resp: 18  Temp: (!) 97 F (36.1 C)  SpO2: 100%   Filed Weights   12/09/20 0830  Weight: 218 lb (98.9 kg)     GENERAL:alert, no distress and comfortable NEURO: alert & oriented x 3 with fluent speech, no focal motor/sensory deficits  LABORATORY DATA:  I have reviewed the data as listed     Component Value Date/Time   NA 139 12/09/2020 0806   K 4.7 12/09/2020 0806   CL 106 12/09/2020 0806   CO2 24 12/09/2020 0806   GLUCOSE 114 (H) 12/09/2020 0806   GLUCOSE 74 05/17/2006 0957   BUN 11 12/09/2020 0806   CREATININE 1.09 (H) 12/09/2020 0806   CALCIUM 9.8 12/09/2020 0806   PROT 7.6 05/10/2020 1432   ALBUMIN 4.0 05/10/2020 1432   AST 20 05/10/2020 1432   AST 16 11/28/2019 1028   ALT 15 05/10/2020 1432   ALT 12 11/28/2019 1028   ALKPHOS 74 05/10/2020 1432   BILITOT 0.4 05/10/2020 1432   BILITOT 0.3 11/28/2019 1028   GFRNONAA 56 (L) 12/09/2020 0806   GFRAA 55 (L) 11/28/2019 1028    No results found for: SPEP, UPEP  Lab Results  Component Value Date   WBC 7.3 12/09/2020   NEUTROABS 4.7 12/09/2020   HGB 12.3 12/09/2020   HCT 37.2 12/09/2020   MCV 86.3 12/09/2020   PLT 298 12/09/2020      Chemistry      Component Value Date/Time   NA 139 12/09/2020 0806   K 4.7 12/09/2020 0806   CL 106 12/09/2020 0806   CO2 24 12/09/2020 0806   BUN 11 12/09/2020 0806   CREATININE 1.09 (H) 12/09/2020 0806      Component Value Date/Time   CALCIUM 9.8 12/09/2020 0806   ALKPHOS 74 05/10/2020 1432   AST 20 05/10/2020 1432   AST 16 11/28/2019 1028   ALT 15 05/10/2020 1432   ALT 12 11/28/2019 1028   BILITOT 0.4 05/10/2020 1432   BILITOT 0.3 11/28/2019 1028

## 2020-12-09 NOTE — Assessment & Plan Note (Signed)
She has mild chronic kidney disease likely secondary to dehydration and other risk factors such as diabetes and hypertension We discussed the importance of dietary modification I recommend her to drink more fluid With her current calculated creatinine clearance, there is no contraindication for her to continue on Eliquis

## 2020-12-13 ENCOUNTER — Encounter (HOSPITAL_COMMUNITY): Payer: BLUE CROSS/BLUE SHIELD

## 2020-12-21 DIAGNOSIS — Z20822 Contact with and (suspected) exposure to covid-19: Secondary | ICD-10-CM | POA: Diagnosis not present

## 2020-12-27 ENCOUNTER — Ambulatory Visit: Admit: 2020-12-27 | Payer: BLUE CROSS/BLUE SHIELD | Admitting: Orthopedic Surgery

## 2020-12-27 SURGERY — ARTHROPLASTY, KNEE, TOTAL
Anesthesia: Choice | Site: Knee | Laterality: Left

## 2021-01-04 ENCOUNTER — Other Ambulatory Visit: Payer: Self-pay

## 2021-01-04 MED ORDER — APIXABAN 2.5 MG PO TABS: 2.5000 mg | ORAL_TABLET | Freq: Two times a day (BID) | ORAL | 3 refills | Status: DC

## 2021-01-05 ENCOUNTER — Telehealth: Payer: Self-pay | Admitting: Hematology and Oncology

## 2021-01-05 DIAGNOSIS — M1712 Unilateral primary osteoarthritis, left knee: Secondary | ICD-10-CM | POA: Diagnosis not present

## 2021-01-05 DIAGNOSIS — M25562 Pain in left knee: Secondary | ICD-10-CM | POA: Diagnosis not present

## 2021-01-05 NOTE — Telephone Encounter (Signed)
Scheduled appointment per 07/19 sch msg. Patient is aware.  

## 2021-01-10 ENCOUNTER — Other Ambulatory Visit: Payer: Self-pay

## 2021-01-10 ENCOUNTER — Ambulatory Visit (INDEPENDENT_AMBULATORY_CARE_PROVIDER_SITE_OTHER): Payer: Medicare Other | Admitting: Internal Medicine

## 2021-01-10 ENCOUNTER — Encounter: Payer: Self-pay | Admitting: Internal Medicine

## 2021-01-10 DIAGNOSIS — N183 Chronic kidney disease, stage 3 unspecified: Secondary | ICD-10-CM | POA: Diagnosis not present

## 2021-01-10 DIAGNOSIS — Z86711 Personal history of pulmonary embolism: Secondary | ICD-10-CM

## 2021-01-10 DIAGNOSIS — M170 Bilateral primary osteoarthritis of knee: Secondary | ICD-10-CM | POA: Diagnosis not present

## 2021-01-10 MED ORDER — TRAMADOL HCL 50 MG PO TABS: 50.0000 mg | ORAL_TABLET | Freq: Four times a day (QID) | ORAL | 1 refills | Status: AC | PRN

## 2021-01-10 NOTE — Patient Instructions (Signed)
For a mild COVID-19 case - take zinc 50 mg a day for 1 week, vitamin C 1000 mg daily for 1 week, vitamin D2 50,000 units weekly for 2 months (unless  taking vitamin D daily already), an antioxidant Quercetin 500 mg twice a day for 1 week (if you can get it quick enough). Take Allegra or Benadryl.  Maintain good oral hydration and take Tylenol for high fever.  Call if problems. Isolate for 5 days, then wear a mask for 5 days per CDC.  

## 2021-01-10 NOTE — Progress Notes (Signed)
Subjective:  Patient ID: Sierra Johnson, female    DOB: [birth year]  Age: 70 y.o. MRN: [id number]  CC: Knee Pain (Left knee)   HPI Sierra Johnson presents for L knee pain 10/10 at times Follow-up on DVT/PE, anticoagulation  Outpatient Medications Prior to Visit  Medication Sig Dispense Refill   Accu-Chek FastClix Lancets MISC USE TO CHECK BLOOD SUGARS TWICE A DAY 102 each 0   albuterol (VENTOLIN HFA) 108 (90 Base) MCG/ACT inhaler Inhale 1-2 puffs into the lungs every 6 (six) hours as needed for wheezing or shortness of breath. 8 g 0   apixaban (ELIQUIS) 2.5 MG TABS tablet Take 1 tablet (2.5 mg total) by mouth 2 (two) times daily. 60 tablet 3   Blood Glucose Monitoring Suppl (ACCU-CHEK GUIDE) w/Device KIT 1 each by Does not apply route 2 (two) times daily. DX: E11.9 1 kit 0   Cholecalciferol (VITAMIN D3) 50 MCG (2000 UT) capsule Take 1 capsule (2,000 Units total) by mouth daily. 100 capsule 3   glucose blood (ACCU-CHEK GUIDE) test strip UUSE TO CHECK BLOOD SUGARS TWICE A DAY 200 strip 1   hydrochlorothiazide (MICROZIDE) 12.5 MG capsule Take 1 capsule by mouth once daily 30 capsule 1   Insulin Glargine, 1 Unit Dial, (TOUJEO SOLOSTAR) 300 UNIT/ML SOPN 18 Units by Subconjunctival route daily as needed (for blood sugars greater than 120). 3 pen 3   metFORMIN (GLUCOPHAGE) 500 MG tablet TAKE 1 TABLET 2 TIMES DAILY WITH A MEAL. ANNUAL APPT IS DUE W/LABS MUST SEE PROVIDER REFILLS 60 tablet 11   olmesartan (BENICAR) 20 MG tablet TAKE 1 TABLET (20 MG TOTAL) BY MOUTH DAILY. 90 tablet 3   polyethylene glycol powder (GLYCOLAX/MIRALAX) 17 GM/SCOOP powder polyethylene glycol 3350 17 gram oral powder packet  TAKE 17 GRAMS BY MOUTH TWICE DAILY     pravastatin (PRAVACHOL) 20 MG tablet TAKE 1 TABLET BY MOUTH EVERY DAY 90 tablet 3   traMADol (ULTRAM) 50 MG tablet Take 1-2 tablets (50-100 mg total) by mouth every 6 (six) hours as needed for moderate pain. 40 tablet 0   vitamin B-12 (CYANOCOBALAMIN) 500 MCG  tablet Take 500 mcg by mouth daily after lunch.      CALCIUM PO Take 1,000 mg by mouth daily after lunch.  (Patient not taking: Reported on 01/10/2021)     fluticasone (FLONASE) 50 MCG/ACT nasal spray Place 2 sprays into both nostrils daily. (Patient not taking: Reported on 01/10/2021) 16 g 6   No facility-administered medications prior to visit.    ROS: Review of Systems  Constitutional:  Negative for activity change, appetite change, chills, fatigue and unexpected weight change.  HENT:  Negative for congestion, mouth sores and sinus pressure.   Eyes:  Negative for visual disturbance.  Respiratory:  Positive for shortness of breath. Negative for cough and chest tightness.   Gastrointestinal:  Negative for abdominal pain and nausea.  Genitourinary:  Negative for difficulty urinating, frequency and vaginal pain.  Musculoskeletal:  Positive for arthralgias and gait problem. Negative for back pain.  Skin:  Negative for pallor and rash.  Neurological:  Negative for dizziness, tremors, weakness, numbness and headaches.  Psychiatric/Behavioral:  Negative for confusion and sleep disturbance.    Objective:  BP 122/78 (BP Location: Left Arm)   Pulse 64   Temp 98.3 F (36.8 C) (Oral)   Wt 218 lb 9.6 oz (99.2 kg)   SpO2 97%   BMI 42.69 kg/m   BP Readings from Last 3 Encounters:  01/10/21 122/78  12/09/20 121/87  05/10/20 124/82    Wt Readings from Last 3 Encounters:  01/10/21 218 lb 9.6 oz (99.2 kg)  12/09/20 218 lb (98.9 kg)  05/10/20 222 lb 9.6 oz (101 kg)    Physical Exam Constitutional:      General: She is not in acute distress.    Appearance: She is well-developed. She is obese.  HENT:     Head: Normocephalic.     Right Ear: External ear normal.     Left Ear: External ear normal.     Nose: Nose normal.  Eyes:     General:        Right eye: No discharge.        Left eye: No discharge.     Conjunctiva/sclera: Conjunctivae normal.     Pupils: Pupils are equal, round, and  reactive to light.  Neck:     Thyroid: No thyromegaly.     Vascular: No JVD.     Trachea: No tracheal deviation.  Cardiovascular:     Rate and Rhythm: Normal rate and regular rhythm.     Heart sounds: Normal heart sounds.  Pulmonary:     Effort: No respiratory distress.     Breath sounds: No stridor. No wheezing.  Abdominal:     General: Bowel sounds are normal. There is no distension.     Palpations: Abdomen is soft. There is no mass.     Tenderness: There is no abdominal tenderness. There is no guarding or rebound.  Musculoskeletal:        General: Tenderness present.     Cervical back: Normal range of motion and neck supple. No rigidity.  Lymphadenopathy:     Cervical: No cervical adenopathy.  Skin:    Findings: No erythema or rash.  Neurological:     Cranial Nerves: No cranial nerve deficit.     Motor: No abnormal muscle tone.     Coordination: Coordination normal.     Deep Tendon Reflexes: Reflexes normal.  Psychiatric:        Behavior: Behavior normal.        Thought Content: Thought content normal.        Judgment: Judgment normal.   Left knee is painful with range of motion Lab Results  Component Value Date   WBC 7.3 12/09/2020   HGB 12.3 12/09/2020   HCT 37.2 12/09/2020   PLT 298 12/09/2020   GLUCOSE 114 (H) 12/09/2020   CHOL 176 05/10/2020   TRIG 73.0 05/10/2020   HDL 66.90 05/10/2020   LDLDIRECT 119.5 08/27/2012   LDLCALC 94 05/10/2020   ALT 15 05/10/2020   AST 20 05/10/2020   NA 139 12/09/2020   K 4.7 12/09/2020   CL 106 12/09/2020   CREATININE 1.09 (H) 12/09/2020   BUN 11 12/09/2020   CO2 24 12/09/2020   TSH 1.60 05/10/2020   INR 1.0 01/23/2019   HGBA1C 6.6 (H) 05/10/2020   MICROALBUR 22.1 (H) 04/29/2019    CT Chest High Resolution  Result Date: 11/19/2019 CLINICAL DATA:  Shortness of breath. Cough for 2 years. Bronchitis. EXAM: CT CHEST WITHOUT CONTRAST TECHNIQUE: Multidetector CT imaging of the chest was performed following the standard  protocol without intravenous contrast. High resolution imaging of the lungs, as well as inspiratory and expiratory imaging, was performed. COMPARISON:  02/27/2018. FINDINGS: Cardiovascular: Atherosclerotic calcification of the aorta. Heart size normal. No pericardial effusion. Mediastinum/Nodes: No pathologically enlarged mediastinal or axillary lymph nodes. Hilar regions are difficult to evaluate without IV contrast. Esophagus is  grossly unremarkable. Small hiatal hernia. Lungs/Pleura: Mild scarring and mucoid impaction in both lower lobes. Negative for subpleural reticulation, traction bronchiectasis/bronchiolectasis, architectural distortion or honeycombing. No pleural fluid. Airway is unremarkable. Expiratory phase imaging was not performed in true expiration, limiting the evaluation for air trapping. Upper Abdomen: Visualized portions of the liver, gallbladder, adrenal glands and right kidney are unremarkable. Low-attenuation lesion in the left kidney measures at least 4.4 cm and is similar to 02/27/2018 but incompletely imaged. Left renal collecting system appears dilated but is incompletely imaged. There may be a subcentimeter angiomyolipoma in the left kidney. Visualized portions of the spleen, pancreas, stomach and bowel are unremarkable with exception of a small hiatal hernia. Musculoskeletal: Degenerative changes in the spine. In no worrisome lytic or sclerotic lesions. IMPRESSION: 1. No evidence of interstitial lung disease. 2. Visualized portion of the left intrarenal collecting system appears prominent but is incompletely imaged. Please correlate clinically. 3. Postinfectious/postinflammatory scarring in both lower lobes. 4.  Aortic atherosclerosis (ICD10-I70.0). Electronically Signed   By: Lorin Picket M.D.   On: 11/19/2019 16:59    Assessment & Plan:     Walker Kehr, MD

## 2021-01-13 DIAGNOSIS — M1712 Unilateral primary osteoarthritis, left knee: Secondary | ICD-10-CM | POA: Diagnosis not present

## 2021-01-13 DIAGNOSIS — M25562 Pain in left knee: Secondary | ICD-10-CM | POA: Diagnosis not present

## 2021-01-14 ENCOUNTER — Other Ambulatory Visit: Payer: Self-pay

## 2021-01-14 ENCOUNTER — Other Ambulatory Visit: Payer: Self-pay | Admitting: Internal Medicine

## 2021-01-14 DIAGNOSIS — E1169 Type 2 diabetes mellitus with other specified complication: Secondary | ICD-10-CM

## 2021-01-14 DIAGNOSIS — I2699 Other pulmonary embolism without acute cor pulmonale: Secondary | ICD-10-CM

## 2021-01-14 DIAGNOSIS — E669 Obesity, unspecified: Secondary | ICD-10-CM

## 2021-01-14 MED ORDER — MOLNUPIRAVIR EUA 200MG CAPSULE: 4.0000 | ORAL_CAPSULE | Freq: Two times a day (BID) | ORAL | 0 refills | Status: AC

## 2021-01-17 ENCOUNTER — Inpatient Hospital Stay (HOSPITAL_BASED_OUTPATIENT_CLINIC_OR_DEPARTMENT_OTHER): Payer: Medicare Other | Admitting: Hematology and Oncology

## 2021-01-17 ENCOUNTER — Inpatient Hospital Stay: Payer: Medicare Other | Attending: Hematology and Oncology

## 2021-01-17 ENCOUNTER — Other Ambulatory Visit: Payer: Self-pay

## 2021-01-17 ENCOUNTER — Encounter: Payer: Self-pay | Admitting: Hematology and Oncology

## 2021-01-17 ENCOUNTER — Telehealth: Payer: Self-pay

## 2021-01-17 DIAGNOSIS — N183 Chronic kidney disease, stage 3 unspecified: Secondary | ICD-10-CM | POA: Diagnosis not present

## 2021-01-17 DIAGNOSIS — Z7901 Long term (current) use of anticoagulants: Secondary | ICD-10-CM | POA: Insufficient documentation

## 2021-01-17 DIAGNOSIS — E1142 Type 2 diabetes mellitus with diabetic polyneuropathy: Secondary | ICD-10-CM | POA: Insufficient documentation

## 2021-01-17 DIAGNOSIS — E1122 Type 2 diabetes mellitus with diabetic chronic kidney disease: Secondary | ICD-10-CM | POA: Insufficient documentation

## 2021-01-17 DIAGNOSIS — Z86711 Personal history of pulmonary embolism: Secondary | ICD-10-CM | POA: Insufficient documentation

## 2021-01-17 DIAGNOSIS — M25561 Pain in right knee: Secondary | ICD-10-CM | POA: Diagnosis not present

## 2021-01-17 DIAGNOSIS — E669 Obesity, unspecified: Secondary | ICD-10-CM | POA: Diagnosis not present

## 2021-01-17 DIAGNOSIS — E1169 Type 2 diabetes mellitus with other specified complication: Secondary | ICD-10-CM | POA: Diagnosis not present

## 2021-01-17 DIAGNOSIS — R059 Cough, unspecified: Secondary | ICD-10-CM | POA: Insufficient documentation

## 2021-01-17 DIAGNOSIS — M25562 Pain in left knee: Secondary | ICD-10-CM | POA: Insufficient documentation

## 2021-01-17 DIAGNOSIS — R0602 Shortness of breath: Secondary | ICD-10-CM | POA: Insufficient documentation

## 2021-01-17 DIAGNOSIS — I2699 Other pulmonary embolism without acute cor pulmonale: Secondary | ICD-10-CM

## 2021-01-17 LAB — CMP (CANCER CENTER ONLY)
ALT: 14 U/L (ref 0–44)
AST: 17 U/L (ref 15–41)
Albumin: 3.5 g/dL (ref 3.5–5.0)
Alkaline Phosphatase: 62 U/L (ref 38–126)
Anion gap: 8 (ref 5–15)
BUN: 11 mg/dL (ref 8–23)
CO2: 24 mmol/L (ref 22–32)
Calcium: 9.8 mg/dL (ref 8.9–10.3)
Chloride: 108 mmol/L (ref 98–111)
Creatinine: 1.04 mg/dL — ABNORMAL HIGH (ref 0.44–1.00)
GFR, Estimated: 59 mL/min — ABNORMAL LOW (ref >60)
Glucose, Bld: 102 mg/dL — ABNORMAL HIGH (ref 70–99)
Potassium: 4.3 mmol/L (ref 3.5–5.1)
Sodium: 140 mmol/L (ref 135–145)
Total Bilirubin: 0.4 mg/dL (ref 0.3–1.2)
Total Protein: 7.5 g/dL (ref 6.5–8.1)

## 2021-01-17 LAB — CBC WITH DIFFERENTIAL (CANCER CENTER ONLY)
Abs Immature Granulocytes: 0.01 10*3/uL (ref 0.00–0.07)
Basophils Absolute: 0 10*3/uL (ref 0.0–0.1)
Basophils Relative: 0 %
Eosinophils Absolute: 0.1 10*3/uL (ref 0.0–0.5)
Eosinophils Relative: 1 %
HCT: 36.6 % (ref 36.0–46.0)
Hemoglobin: 12 g/dL (ref 12.0–15.0)
Immature Granulocytes: 0 %
Lymphocytes Relative: 27 %
Lymphs Abs: 1.9 10*3/uL (ref 0.7–4.0)
MCH: 28.2 pg (ref 26.0–34.0)
MCHC: 32.8 g/dL (ref 30.0–36.0)
MCV: 85.9 fL (ref 80.0–100.0)
Monocytes Absolute: 0.6 10*3/uL (ref 0.1–1.0)
Monocytes Relative: 9 %
Neutro Abs: 4.5 10*3/uL (ref 1.7–7.7)
Neutrophils Relative %: 63 %
Platelet Count: 287 10*3/uL (ref 150–400)
RBC: 4.26 MIL/uL (ref 3.87–5.11)
RDW: 14.6 % (ref 11.5–15.5)
WBC Count: 7.2 10*3/uL (ref 4.0–10.5)
nRBC: 0 % (ref 0.0–0.2)

## 2021-01-17 NOTE — Assessment & Plan Note (Signed)
Her knee surgery was canceled due to class III obesity Since our last discussion, she was able to lose some weight and felt that her dietary changes is sustainable and she can see herself with her continuous effort to lose weight The goal would be to get her around 200 pounds before she can have surgery, according to her orthopedic surgeon I congratulated her effort

## 2021-01-17 NOTE — Assessment & Plan Note (Signed)
We discussed the importance of aggressive lifestyle changes, dietary modification, risk factor control and more oral fluid intake

## 2021-01-17 NOTE — Telephone Encounter (Signed)
Mark with [employer] called in regards to some forms needing to be signed for a PA for some medical supplies.

## 2021-01-17 NOTE — Progress Notes (Signed)
[facility] OFFICE PROGRESS NOTE  Johnson, Sierra Lacks, MD  ASSESSMENT & PLAN:  History of pulmonary embolus (PE) She had history of pulmonary embolism appears to be unprovoked The patient have other risk factors that could predispose her to recurrent thromboembolism For now, she will continue anticoagulation therapy indefinitely She does not need bridging treatment for future surgery I inform her and remind her she need to stop Eliquis for 48 hours prior to anticipated surgery and resume the following day, assuming she had no excessive bleeding complications She will call me once she is scheduled for her future surgery  Obesity, Class III, BMI 40-49.9 (morbid obesity) ([place]) Her knee surgery was canceled due to class III obesity Since our last discussion, she was able to lose some weight and felt that her dietary changes is sustainable and she can see herself with her continuous effort to lose weight The goal would be to get her around 200 pounds before she can have surgery, according to her orthopedic surgeon I congratulated her effort  Diabetes mellitus type 2 in obese ([place]) Her blood sugar is well controlled with metformin only She will continue her aggressive dietary changes and weight loss journey  CKD (chronic kidney disease) stage 3, GFR 30-59 ml/min We discussed the importance of aggressive lifestyle changes, dietary modification, risk factor control and more oral fluid intake  No orders of the defined types were placed in this encounter.   The total time spent in the appointment was 20 minutes encounter with patients including review of chart and various tests results, discussions about plan of care and coordination of care plan   All questions were answered. The patient knows to call the clinic with any problems, questions or concerns. No barriers to learning was detected.    Sierra Lark, MD 8/1/20229:34 AM  INTERVAL HISTORY: Sierra Johnson 70 y.o.  female returns for further follow-up on history of PE, chronic anticoagulation therapy Since last time I saw her, she have lost some weight She is able to do it through dietary modification and felt it is sustainable She denies bleeding complications from her anticoagulation therapy  SUMMARY OF HEMATOLOGIC HISTORY: Summary of Hematological History Patient reports that starting in early August 2019, she began to develop new onset cough and sinus symptoms, associated with low-grade fever, for which she was prescribed 3 different courses of antibiotics without improvement.  In early September 2019, she also developed new onset shortness of breath as well as chest wall soreness from repetitive coughing.  She did not have any lower extremity swelling, warmth, tenderness or erythema.  Her PCP ordered a CT angiogram of the chest, which showed a right upper lobe PTE, for which she was sent to the ER for further management.  While in the hospital, patient was initially started on heparin drip, and the transition to Eliquis at discharge.  She denied any extended travel, being on hormone replacement therapy, or hospitalization/surgery prior to her diagnosis of VTE.  There is no personal or family history of malignancy.  Patient's mother had a blood clot after hip surgery, but there is no other family member with history of VTE. The patient had sedentary lifestyle She has class III obesity with diabetes Between 2019-2021, she is maintained on anticoagulation therapy but after a year of anticoagulation treatment, she is on low-dose preventative maintenance Eliquis  She is retired She is not able to mobilize much due to bilateral knee pain She struggle a lot with her weight She has  several medication adjustments since she was last seen here She denies complications from diabetes such as peripheral neuropathy She was prescribed gabapentin for a while for unrelated reason but is no longer taking it She continues  to have occasional cough and shortness of breath The patient denies any recent signs or symptoms of bleeding such as spontaneous epistaxis, hematuria or hematochezia. She was placed on Eliquis extended duration treatment at 2.5 mg twice daily indefinitely  I have reviewed the past medical history, past surgical history, social history and family history with the patient and they are unchanged from previous note.  ALLERGIES:  is allergic to benazepril, amlodipine, and tetracycline.  MEDICATIONS:  Current Outpatient Medications  Medication Sig Dispense Refill   Accu-Chek FastClix Lancets MISC USE TO CHECK BLOOD SUGARS TWICE A DAY 102 each 0   albuterol (VENTOLIN HFA) 108 (90 Base) MCG/ACT inhaler Inhale 1-2 puffs into the lungs every 6 (six) hours as needed for wheezing or shortness of breath. 8 g 0   apixaban (ELIQUIS) 2.5 MG TABS tablet Take 1 tablet (2.5 mg total) by mouth 2 (two) times daily. 60 tablet 3   Blood Glucose Monitoring Suppl (ACCU-CHEK GUIDE) w/Device KIT 1 each by Does not apply route 2 (two) times daily. DX: E11.9 1 kit 0   Cholecalciferol (VITAMIN D3) 50 MCG (2000 UT) capsule Take 1 capsule (2,000 Units total) by mouth daily. 100 capsule 3   glucose blood (ACCU-CHEK GUIDE) test strip UUSE TO CHECK BLOOD SUGARS TWICE A DAY 200 strip 1   metFORMIN (GLUCOPHAGE) 500 MG tablet TAKE 1 TABLET 2 TIMES DAILY WITH A MEAL. ANNUAL APPT IS DUE W/LABS MUST SEE PROVIDER REFILLS 60 tablet 11   molnupiravir EUA 200 mg CAPS Take 4 capsules (800 mg total) by mouth 2 (two) times daily for 5 days. 40 capsule 0   olmesartan (BENICAR) 20 MG tablet TAKE 1 TABLET (20 MG TOTAL) BY MOUTH DAILY. 90 tablet 3   polyethylene glycol powder (GLYCOLAX/MIRALAX) 17 GM/SCOOP powder polyethylene glycol 3350 17 gram oral powder packet  TAKE 17 GRAMS BY MOUTH TWICE DAILY     pravastatin (PRAVACHOL) 20 MG tablet TAKE 1 TABLET BY MOUTH EVERY DAY 90 tablet 3   vitamin B-12 (CYANOCOBALAMIN) 500 MCG tablet Take 500 mcg  by mouth daily after lunch.      No current facility-administered medications for this visit.     REVIEW OF SYSTEMS:   Constitutional: Denies fevers, chills or night sweats Eyes: Denies blurriness of vision Ears, nose, mouth, throat, and face: Denies mucositis or sore throat Respiratory: Denies cough, dyspnea or wheezes Cardiovascular: Denies palpitation, chest discomfort or lower extremity swelling Gastrointestinal:  Denies nausea, heartburn or change in bowel habits Skin: Denies abnormal skin rashes Lymphatics: Denies new lymphadenopathy or easy bruising Neurological:Denies numbness, tingling or new weaknesses Behavioral/Psych: Mood is stable, no new changes  All other systems were reviewed with the patient and are negative.  PHYSICAL EXAMINATION: ECOG PERFORMANCE STATUS: 0 - Asymptomatic  Vitals:   01/17/21 0852  BP: (!) 144/68  Pulse: 61  Resp: 18  Temp: (!) 97.4 F (36.3 C)  SpO2: 100%   Filed Weights   01/17/21 0852  Weight: 215 lb 9.6 oz (97.8 kg)    GENERAL:alert, no distress and comfortable NEURO: alert & oriented x 3 with fluent speech, no focal motor/sensory deficits  LABORATORY DATA:  I have reviewed the data as listed     Component Value Date/Time   NA 140 01/17/2021 0830   K 4.3  01/17/2021 0830   CL 108 01/17/2021 0830   CO2 24 01/17/2021 0830   GLUCOSE 102 (H) 01/17/2021 0830   GLUCOSE 74 05/17/2006 0957   BUN 11 01/17/2021 0830   CREATININE 1.04 (H) 01/17/2021 0830   CALCIUM 9.8 01/17/2021 0830   PROT 7.5 01/17/2021 0830   ALBUMIN 3.5 01/17/2021 0830   AST 17 01/17/2021 0830   ALT 14 01/17/2021 0830   ALKPHOS 62 01/17/2021 0830   BILITOT 0.4 01/17/2021 0830   GFRNONAA 59 (L) 01/17/2021 0830   GFRAA 55 (L) 11/28/2019 1028    No results found for: SPEP, UPEP  Lab Results  Component Value Date   WBC 7.2 01/17/2021   NEUTROABS 4.5 01/17/2021   HGB 12.0 01/17/2021   HCT 36.6 01/17/2021   MCV 85.9 01/17/2021   PLT 287 01/17/2021       Chemistry      Component Value Date/Time   NA 140 01/17/2021 0830   K 4.3 01/17/2021 0830   CL 108 01/17/2021 0830   CO2 24 01/17/2021 0830   BUN 11 01/17/2021 0830   CREATININE 1.04 (H) 01/17/2021 0830      Component Value Date/Time   CALCIUM 9.8 01/17/2021 0830   ALKPHOS 62 01/17/2021 0830   AST 17 01/17/2021 0830   ALT 14 01/17/2021 0830   BILITOT 0.4 01/17/2021 0830

## 2021-01-17 NOTE — Assessment & Plan Note (Signed)
She had history of pulmonary embolism appears to be unprovoked The patient have other risk factors that could predispose her to recurrent thromboembolism For now, she will continue anticoagulation therapy indefinitely She does not need bridging treatment for future surgery I inform her and remind her she need to stop Eliquis for 48 hours prior to anticipated surgery and resume the following day, assuming she had no excessive bleeding complications She will call me once she is scheduled for her future surgery

## 2021-01-17 NOTE — Assessment & Plan Note (Signed)
Her blood sugar is well controlled with metformin only She will continue her aggressive dietary changes and weight loss journey

## 2021-01-19 ENCOUNTER — Telehealth: Payer: Self-pay | Admitting: *Deleted

## 2021-01-19 NOTE — Telephone Encounter (Signed)
-----   Message from Marguarite Arbour sent at 01/19/2021 11:53 AM EDT ----- PA

## 2021-01-19 NOTE — Telephone Encounter (Signed)
Duplicate see previous msg../lmb 

## 2021-01-20 DIAGNOSIS — M25562 Pain in left knee: Secondary | ICD-10-CM | POA: Diagnosis not present

## 2021-01-20 DIAGNOSIS — M1712 Unilateral primary osteoarthritis, left knee: Secondary | ICD-10-CM | POA: Diagnosis not present

## 2021-01-25 NOTE — Assessment & Plan Note (Signed)
Continue with Eliquis. ?

## 2021-01-25 NOTE — Assessment & Plan Note (Addendum)
New arthritis exacerbation in the left knee.  Will use tramadol as needed  Potential benefits of a long term opioids use as well as potential risks (i.e. addiction risk, apnea etc) and complications (i.e. Somnolence, constipation and others) were explained to the patient and were aknowledged. Use a knee brace Orthopedic referral was offered

## 2021-01-25 NOTE — Assessment & Plan Note (Signed)
Continue to monitor GFR.  Good hydration

## 2021-01-26 ENCOUNTER — Telehealth: Payer: Self-pay | Admitting: *Deleted

## 2021-01-26 NOTE — Telephone Encounter (Signed)
-----   Message from [place] sent at 01/17/2021  2:04 PM EDT ----- PA request

## 2021-01-27 DIAGNOSIS — M1712 Unilateral primary osteoarthritis, left knee: Secondary | ICD-10-CM | POA: Diagnosis not present

## 2021-01-27 DIAGNOSIS — M25562 Pain in left knee: Secondary | ICD-10-CM | POA: Diagnosis not present

## 2021-01-28 ENCOUNTER — Telehealth: Payer: Self-pay | Admitting: Internal Medicine

## 2021-01-28 NOTE — Telephone Encounter (Signed)
Mark from [employer] called in to verify if we had received, signed, & faxed back forms they submitted on 07.28.22  Sent another fax 08.01.22 of exact same forms (forms are listed under media tab)  Forms have not been signed by provider yet  Elta [place] is requesting these forms be signed & faxed back today if at all possible bc it is urgent they get them submitted today on patient behalf  Callback # [phone]

## 2021-01-31 NOTE — Telephone Encounter (Signed)
MD did received form and was faxed back on 01/21/21. Will refax.Marland KitchenJohny Chess

## 2021-02-03 DIAGNOSIS — M25562 Pain in left knee: Secondary | ICD-10-CM | POA: Diagnosis not present

## 2021-02-03 DIAGNOSIS — M1712 Unilateral primary osteoarthritis, left knee: Secondary | ICD-10-CM | POA: Diagnosis not present

## 2021-02-11 DIAGNOSIS — E119 Type 2 diabetes mellitus without complications: Secondary | ICD-10-CM | POA: Diagnosis not present

## 2021-02-16 NOTE — Telephone Encounter (Signed)
Faxed last ov 01/10/21.Marland KitchenJohny Johnson

## 2021-02-16 NOTE — Telephone Encounter (Signed)
[place] is requesting patient notes from last visit to accomodate form for medical equipment  Please fax to [phone] ATTN: Tula Nakayama # [phone]

## 2021-03-01 DIAGNOSIS — Z23 Encounter for immunization: Secondary | ICD-10-CM | POA: Diagnosis not present

## 2021-04-20 DIAGNOSIS — M25462 Effusion, left knee: Secondary | ICD-10-CM | POA: Diagnosis not present

## 2021-04-20 DIAGNOSIS — M25562 Pain in left knee: Secondary | ICD-10-CM | POA: Diagnosis not present

## 2021-04-20 DIAGNOSIS — M1712 Unilateral primary osteoarthritis, left knee: Secondary | ICD-10-CM | POA: Diagnosis not present

## 2021-04-22 DIAGNOSIS — M1712 Unilateral primary osteoarthritis, left knee: Secondary | ICD-10-CM | POA: Diagnosis not present

## 2021-04-22 DIAGNOSIS — M170 Bilateral primary osteoarthritis of knee: Secondary | ICD-10-CM | POA: Diagnosis not present

## 2021-04-22 DIAGNOSIS — M1711 Unilateral primary osteoarthritis, right knee: Secondary | ICD-10-CM | POA: Diagnosis not present

## 2021-05-02 ENCOUNTER — Telehealth: Payer: Self-pay | Admitting: *Deleted

## 2021-05-02 NOTE — Telephone Encounter (Signed)
Faxed completed pre-op clearance signed by Dr. Alvy Bimler to [facility]/Dr. Maureen Ralphs. Fax confirmation received.Copy of signed surgical clearance sent to HIM to be scanned into patient chart

## 2021-05-13 ENCOUNTER — Other Ambulatory Visit: Payer: Self-pay | Admitting: Internal Medicine

## 2021-05-16 DIAGNOSIS — Z20822 Contact with and (suspected) exposure to covid-19: Secondary | ICD-10-CM | POA: Diagnosis not present

## 2021-05-20 ENCOUNTER — Ambulatory Visit (INDEPENDENT_AMBULATORY_CARE_PROVIDER_SITE_OTHER): Payer: Medicare Other | Admitting: Internal Medicine

## 2021-05-20 ENCOUNTER — Other Ambulatory Visit: Payer: Self-pay

## 2021-05-20 ENCOUNTER — Encounter: Payer: Self-pay | Admitting: Internal Medicine

## 2021-05-20 VITALS — BP 122/82 | HR 65 | Temp 98.5°F | Ht 60.0 in | Wt 208.2 lb

## 2021-05-20 DIAGNOSIS — N183 Chronic kidney disease, stage 3 unspecified: Secondary | ICD-10-CM | POA: Diagnosis not present

## 2021-05-20 DIAGNOSIS — E1169 Type 2 diabetes mellitus with other specified complication: Secondary | ICD-10-CM

## 2021-05-20 DIAGNOSIS — Z01818 Encounter for other preprocedural examination: Secondary | ICD-10-CM | POA: Diagnosis not present

## 2021-05-20 DIAGNOSIS — E669 Obesity, unspecified: Secondary | ICD-10-CM | POA: Diagnosis not present

## 2021-05-20 DIAGNOSIS — I10 Essential (primary) hypertension: Secondary | ICD-10-CM

## 2021-05-20 DIAGNOSIS — J301 Allergic rhinitis due to pollen: Secondary | ICD-10-CM | POA: Diagnosis not present

## 2021-05-20 DIAGNOSIS — T7840XA Allergy, unspecified, initial encounter: Secondary | ICD-10-CM | POA: Diagnosis not present

## 2021-05-20 DIAGNOSIS — E785 Hyperlipidemia, unspecified: Secondary | ICD-10-CM | POA: Diagnosis not present

## 2021-05-20 DIAGNOSIS — E538 Deficiency of other specified B group vitamins: Secondary | ICD-10-CM | POA: Diagnosis not present

## 2021-05-20 DIAGNOSIS — J019 Acute sinusitis, unspecified: Secondary | ICD-10-CM | POA: Diagnosis not present

## 2021-05-20 MED ORDER — AZITHROMYCIN 250 MG PO TABS: ORAL_TABLET | ORAL | 0 refills | Status: DC

## 2021-05-20 MED ORDER — LORATADINE 10 MG PO TABS: 10.0000 mg | ORAL_TABLET | Freq: Every day | ORAL | 11 refills | Status: DC

## 2021-05-20 MED ORDER — RYBELSUS 3 MG PO TABS: 3.0000 mg | ORAL_TABLET | Freq: Every day | ORAL | 3 refills | Status: DC

## 2021-05-20 MED ORDER — METHYLPREDNISOLONE ACETATE 80 MG/ML IJ SUSP
80.0000 mg | Freq: Once | INTRAMUSCULAR | Status: AC
  Administered 2021-05-20: 80 mg via INTRAMUSCULAR

## 2021-05-20 MED ORDER — OXYMETAZOLINE HCL 0.05 % NA SOLN: 1.0000 | Freq: Two times a day (BID) | NASAL | 0 refills | Status: AC

## 2021-05-20 NOTE — Assessment & Plan Note (Signed)
On diet Add Rybelsus Cont Metformin

## 2021-05-20 NOTE — Assessment & Plan Note (Signed)
Hydrate well 

## 2021-05-20 NOTE — Assessment & Plan Note (Signed)
Add Rybelsus Cont Metformin

## 2021-05-20 NOTE — Progress Notes (Signed)
Subjective:  Patient ID: Sierra Johnson, female    DOB: [birth year]  Age: 70 y.o. MRN: [id number]  CC: Annual Exam (Also need medical clearance form fill out- (L) Knee surgery)   HPI CARLIYAH COTTERMAN presents for nasal congestion - worse 6 days, ST COVID (-). The patient needs surgical clearance for her left total knee replacement Preop clearance Reason pending left total knee replacement Requested by Dr. Wynelle Link History the patient has a history of PE, chronic anticoagulation, dyslipidemia  Outpatient Medications Prior to Visit  Medication Sig Dispense Refill   Accu-Chek FastClix Lancets MISC USE TO CHECK BLOOD SUGARS TWICE A DAY 102 each 0   albuterol (VENTOLIN HFA) 108 (90 Base) MCG/ACT inhaler Inhale 1-2 puffs into the lungs every 6 (six) hours as needed for wheezing or shortness of breath. 8 g 0   apixaban (ELIQUIS) 2.5 MG TABS tablet Take 1 tablet (2.5 mg total) by mouth 2 (two) times daily. 60 tablet 3   Blood Glucose Monitoring Suppl (ACCU-CHEK GUIDE) w/Device KIT 1 each by Does not apply route 2 (two) times daily. DX: E11.9 1 kit 0   Cholecalciferol (VITAMIN D3) 50 MCG (2000 UT) capsule Take 1 capsule (2,000 Units total) by mouth daily. 100 capsule 3   glucose blood (ACCU-CHEK GUIDE) test strip UUSE TO CHECK BLOOD SUGARS TWICE A DAY 200 strip 1   metFORMIN (GLUCOPHAGE) 500 MG tablet TAKE 1 TABLET 2 TIMES DAILY WITH A MEAL. ANNUAL APPT IS DUE W/LABS MUST SEE PROVIDER REFILLS 60 tablet 11   olmesartan (BENICAR) 20 MG tablet Take 1 tablet (20 mg total) by mouth daily. Follow-up appt w/ LABS due in [place] must see provider for future refills 90 tablet 0   polyethylene glycol powder (GLYCOLAX/MIRALAX) 17 GM/SCOOP powder polyethylene glycol 3350 17 gram oral powder packet  TAKE 17 GRAMS BY MOUTH TWICE DAILY     pravastatin (PRAVACHOL) 20 MG tablet TAKE 1 TABLET BY MOUTH EVERY DAY 90 tablet 3   vitamin B-12 (CYANOCOBALAMIN) 500 MCG tablet Take 500 mcg by mouth daily after lunch.       No facility-administered medications prior to visit.    ROS: Review of Systems  Constitutional:  Negative for activity change, appetite change, chills, fatigue and unexpected weight change.  HENT:  Negative for congestion, mouth sores and sinus pressure.   Eyes:  Negative for visual disturbance.  Respiratory:  Negative for cough and chest tightness.   Gastrointestinal:  Negative for abdominal pain and nausea.  Genitourinary:  Negative for difficulty urinating, frequency and vaginal pain.  Musculoskeletal:  Positive for arthralgias and gait problem. Negative for back pain.  Skin:  Negative for pallor and rash.  Neurological:  Negative for dizziness, tremors, weakness, numbness and headaches.  Psychiatric/Behavioral:  Negative for confusion and sleep disturbance.    Objective:  BP 122/82 (BP Location: Left Arm)   Pulse 65   Temp 98.5 F (36.9 C) (Oral)   Ht 5' (1.524 m)   Wt 208 lb 3.2 oz (94.4 kg)   SpO2 96%   BMI 40.66 kg/m   BP Readings from Last 3 Encounters:  05/20/21 122/82  01/17/21 (!) 144/68  01/10/21 122/78    Wt Readings from Last 3 Encounters:  05/20/21 208 lb 3.2 oz (94.4 kg)  01/17/21 215 lb 9.6 oz (97.8 kg)  01/10/21 218 lb 9.6 oz (99.2 kg)    Physical Exam Constitutional:      General: She is not in acute distress.    Appearance: She is  well-developed. She is obese.  HENT:     Head: Normocephalic.     Right Ear: External ear normal.     Left Ear: External ear normal.     Nose: Nose normal.  Eyes:     General:        Right eye: No discharge.        Left eye: No discharge.     Conjunctiva/sclera: Conjunctivae normal.     Pupils: Pupils are equal, round, and reactive to light.  Neck:     Thyroid: No thyromegaly.     Vascular: No JVD.     Trachea: No tracheal deviation.  Cardiovascular:     Rate and Rhythm: Normal rate and regular rhythm.     Heart sounds: Normal heart sounds.  Pulmonary:     Effort: No respiratory distress.     Breath  sounds: No stridor. No wheezing.  Abdominal:     General: Bowel sounds are normal. There is no distension.     Palpations: Abdomen is soft. There is no mass.     Tenderness: There is no abdominal tenderness. There is no guarding or rebound.  Musculoskeletal:        General: Tenderness present.     Cervical back: Normal range of motion and neck supple. No rigidity.  Lymphadenopathy:     Cervical: No cervical adenopathy.  Skin:    Findings: No erythema or rash.  Neurological:     Cranial Nerves: No cranial nerve deficit.     Motor: No abnormal muscle tone.     Coordination: Coordination normal.     Deep Tendon Reflexes: Reflexes normal.  Psychiatric:        Behavior: Behavior normal.        Thought Content: Thought content normal.        Judgment: Judgment normal.  Limping a little    A total time of 46 minutes was spent preparing to see the patient, reviewing tests, x-rays, operative reports and other records.  Also, obtaining history and performing comprehensive physical exam.  Additionally, counseling the patient regarding the above listed issues.   Finally, documenting clinical information in the health records, coordination of care, educating the patient.    Lab Results  Component Value Date   WBC 7.2 01/17/2021   HGB 12.0 01/17/2021   HCT 36.6 01/17/2021   PLT 287 01/17/2021   GLUCOSE 102 (H) 01/17/2021   CHOL 176 05/10/2020   TRIG 73.0 05/10/2020   HDL 66.90 05/10/2020   LDLDIRECT 119.5 08/27/2012   LDLCALC 94 05/10/2020   ALT 14 01/17/2021   AST 17 01/17/2021   NA 140 01/17/2021   K 4.3 01/17/2021   CL 108 01/17/2021   CREATININE 1.04 (H) 01/17/2021   BUN 11 01/17/2021   CO2 24 01/17/2021   TSH 1.60 05/10/2020   INR 1.0 01/23/2019   HGBA1C 6.6 (H) 05/10/2020   MICROALBUR 22.1 (H) 04/29/2019    CT Chest High Resolution  Result Date: 11/19/2019 CLINICAL DATA:  Shortness of breath. Cough for 2 years. Bronchitis. EXAM: CT CHEST WITHOUT CONTRAST TECHNIQUE:  Multidetector CT imaging of the chest was performed following the standard protocol without intravenous contrast. High resolution imaging of the lungs, as well as inspiratory and expiratory imaging, was performed. COMPARISON:  02/27/2018. FINDINGS: Cardiovascular: Atherosclerotic calcification of the aorta. Heart size normal. No pericardial effusion. Mediastinum/Nodes: No pathologically enlarged mediastinal or axillary lymph nodes. Hilar regions are difficult to evaluate without IV contrast. Esophagus is grossly unremarkable. Small hiatal  hernia. Lungs/Pleura: Mild scarring and mucoid impaction in both lower lobes. Negative for subpleural reticulation, traction bronchiectasis/bronchiolectasis, architectural distortion or honeycombing. No pleural fluid. Airway is unremarkable. Expiratory phase imaging was not performed in true expiration, limiting the evaluation for air trapping. Upper Abdomen: Visualized portions of the liver, gallbladder, adrenal glands and right kidney are unremarkable. Low-attenuation lesion in the left kidney measures at least 4.4 cm and is similar to 02/27/2018 but incompletely imaged. Left renal collecting system appears dilated but is incompletely imaged. There may be a subcentimeter angiomyolipoma in the left kidney. Visualized portions of the spleen, pancreas, stomach and bowel are unremarkable with exception of a small hiatal hernia. Musculoskeletal: Degenerative changes in the spine. In no worrisome lytic or sclerotic lesions. IMPRESSION: 1. No evidence of interstitial lung disease. 2. Visualized portion of the left intrarenal collecting system appears prominent but is incompletely imaged. Please correlate clinically. 3. Postinfectious/postinflammatory scarring in both lower lobes. 4.  Aortic atherosclerosis (ICD10-I70.0). Electronically Signed   By: Lorin Picket M.D.   On: 11/19/2019 16:59    Assessment & Plan:   Problem List Items Addressed This Visit     Obesity, Class III,  BMI 40-49.9 (morbid obesity) (HCC) (Chronic)    On diet Add Rybelsus Cont Metformin      Relevant Medications   Semaglutide (RYBELSUS) 3 MG TABS   Allergic rhinitis   B12 deficiency    On B12      CKD (chronic kidney disease) stage 3, GFR 30-59 ml/min (HCC)    Hydrate well      Diabetes mellitus type 2 in obese (HCC)    Add Rybelsus Cont Metformin      Relevant Medications   Semaglutide (RYBELSUS) 3 MG TABS   Dyslipidemia    Pravastatin 11.20      Essential hypertension - Primary   Relevant Orders   EKG 12-Lead   Preop exam for internal medicine     Mrs. Dombrosky is medically clear for her left total knee replacement.  Please discontinue and resume Eliquis per your protocol.  She will have to continue Eliquis indefinitely for her history of unprovoked PE.  Use of inhalers perioperatively.  Monitor blood sugars perioperatively.  Her blood tests are pending.  She was given a Z-Pak and a Depo-Medrol injection 80 mg today for acute sinusitis with severe congestion.  Thank you!      Sinusitis, acute     New.  She was given a Z-Pak and a Depo-Medrol injection 80 mg today for acute sinusitis with severe congestion.       Relevant Medications   azithromycin (ZITHROMAX Z-PAK) 250 MG tablet   loratadine (CLARITIN) 10 MG tablet   oxymetazoline (AFRIN NASAL SPRAY) 0.05 % nasal spray   Other Visit Diagnoses     Preoperative clearance       Relevant Orders   EKG 12-Lead   Allergy, initial encounter       Relevant Medications   methylPREDNISolone acetate (DEPO-MEDROL) injection 80 mg (Completed)         Meds ordered this encounter  Medications   Semaglutide (RYBELSUS) 3 MG TABS    Sig: Take 3 mg by mouth daily.    Dispense:  30 tablet    Refill:  3   azithromycin (ZITHROMAX Z-PAK) 250 MG tablet    Sig: As directed    Dispense:  6 tablet    Refill:  0   loratadine (CLARITIN) 10 MG tablet    Sig: Take 1 tablet (10 mg  total) by mouth daily.    Dispense:  30 tablet     Refill:  11   oxymetazoline (AFRIN NASAL SPRAY) 0.05 % nasal spray    Sig: Place 1 spray into both nostrils 2 (two) times daily for 7 days.    Dispense:  30 mL    Refill:  0   methylPREDNISolone acetate (DEPO-MEDROL) injection 80 mg      Follow-up: Return in about 6 weeks (around 07/01/2021) for a follow-up visit.  Walker Kehr, MD

## 2021-05-20 NOTE — Assessment & Plan Note (Signed)
On B12 

## 2021-05-20 NOTE — Assessment & Plan Note (Signed)
Pravastatin 11.20

## 2021-05-22 NOTE — Assessment & Plan Note (Signed)
New.  She was given a Z-Pak and a Depo-Medrol injection 80 mg today for acute sinusitis with severe congestion.

## 2021-05-22 NOTE — Assessment & Plan Note (Signed)
Mrs. Bungert is medically clear for her left total knee replacement.  Please discontinue and resume Eliquis per your protocol.  She will have to continue Eliquis indefinitely for her history of unprovoked PE.  Use of inhalers perioperatively.  Monitor blood sugars perioperatively.  Her blood tests are pending.  She was given a Z-Pak and a Depo-Medrol injection 80 mg today for acute sinusitis with severe congestion.  Thank you!

## 2021-05-25 ENCOUNTER — Encounter: Payer: Self-pay | Admitting: Internal Medicine

## 2021-06-05 ENCOUNTER — Encounter: Payer: Self-pay | Admitting: Internal Medicine

## 2021-06-07 ENCOUNTER — Telehealth: Payer: Self-pay

## 2021-06-07 ENCOUNTER — Other Ambulatory Visit: Payer: Self-pay | Admitting: Internal Medicine

## 2021-06-07 MED ORDER — CEFUROXIME AXETIL 250 MG PO TABS: 250.0000 mg | ORAL_TABLET | Freq: Two times a day (BID) | ORAL | 0 refills | Status: AC

## 2021-06-07 NOTE — Telephone Encounter (Signed)
Pt called asking for a pneumo vacc. I was able to add her to the nurse visit for tomorrow.  *Can you verify which pneumo vacc pt needs?

## 2021-06-07 NOTE — Telephone Encounter (Signed)
Prevnar 20 Thx

## 2021-06-08 ENCOUNTER — Ambulatory Visit (INDEPENDENT_AMBULATORY_CARE_PROVIDER_SITE_OTHER): Payer: Medicare Other

## 2021-06-08 ENCOUNTER — Other Ambulatory Visit: Payer: Self-pay

## 2021-06-08 DIAGNOSIS — Z23 Encounter for immunization: Secondary | ICD-10-CM

## 2021-06-08 DIAGNOSIS — E538 Deficiency of other specified B group vitamins: Secondary | ICD-10-CM

## 2021-06-08 MED ORDER — CYANOCOBALAMIN 1000 MCG/ML IJ SOLN
1000.0000 ug | Freq: Once | INTRAMUSCULAR | Status: AC
  Administered 2021-06-08: 11:00:00 1000 ug via INTRAMUSCULAR

## 2021-06-08 NOTE — Progress Notes (Deleted)
Pt was given B12 w/o any complications. Medical screening examination/treatment/procedure(s) were performed by non-physician practitioner and as supervising physician I was immediately available for consultation/collaboration.  I agree with above. Aleksei Plotnikov, MD 

## 2021-06-30 ENCOUNTER — Telehealth: Payer: Self-pay | Admitting: Internal Medicine

## 2021-06-30 NOTE — Telephone Encounter (Signed)
I called this patient to schedule her AWV.  I asked if she wanted to come in for AWV on same day as upcoming appointment for B-12 injection scheduled for 07/11/21 @ 9:40. Patient stated that she has never gotten a B-12 injection and was not aware of this appointment. Patient would like a call back from someone in regards to upcoming B-12 injection appointment. I see that you scheduled this appointment, so could you please call patient in regards to this.

## 2021-07-01 NOTE — Progress Notes (Addendum)
Pt was given Prevnar 20 w/o any complications.  Medical screening examination/treatment/procedure(s) were performed by non-physician practitioner and as supervising physician I was immediately available for consultation/collaboration.  I agree with above. Lew Dawes, MD

## 2021-07-01 NOTE — Telephone Encounter (Signed)
Spoke with pt and was able to inform her that the Nurse visit for B12 was scheduled in error. Pt understood and has no questions at this time.

## 2021-07-01 NOTE — Addendum Note (Signed)
Addended by: Marijean Heath R on: 07/01/2021 08:30 AM   Modules accepted: Orders

## 2021-07-04 ENCOUNTER — Ambulatory Visit (INDEPENDENT_AMBULATORY_CARE_PROVIDER_SITE_OTHER): Payer: Medicare Other

## 2021-07-04 ENCOUNTER — Other Ambulatory Visit: Payer: Self-pay

## 2021-07-04 DIAGNOSIS — Z0000 Encounter for general adult medical examination without abnormal findings: Secondary | ICD-10-CM | POA: Diagnosis not present

## 2021-07-04 NOTE — Patient Instructions (Signed)
Sierra Johnson , Thank you for taking time to come for your Medicare Wellness Visit. I appreciate your ongoing commitment to your health goals. Please review the following plan we discussed and let me know if I can assist you in the future.   Screening recommendations/referrals: Colonoscopy: 11/18/2018; due every 10 years Mammogram: 10/09/2019 (per Dr. Clementeen Hoof office); Please give them a call to schedule for this year at [phone]). Bone Density: 10/09/2019; due every 2 years Recommended yearly ophthalmology/optometry visit for glaucoma screening and checkup Recommended yearly dental visit for hygiene and checkup  Vaccinations: Influenza vaccine: 03/01/2021 Pneumococcal vaccine: 05/07/2019, 06/08/2021 Tdap vaccine: 05/07/2019; due every 10 years Shingles vaccine: 06/05/2017, 08/10/2017  Covid-19:2 /09/2019, 08/14/2019, 03/22/2020, 03/08/2021   Advanced directives: Advance directive discussed with you today. Even though you declined this today please call our office should you change your mind and we can give you the proper paperwork for you to fill out.  Conditions/risks identified: My goal is to lose 30 pounds and continue my vegan diet/stay hydrated.  Next appointment: Please schedule your next Medicare Wellness Visit with your Nurse Health Advisor in 1 year by calling [phone].   Preventive Care 10 Years and Older, Female Preventive care refers to lifestyle choices and visits with your health care provider that can promote health and wellness. What does preventive care include? A yearly physical exam. This is also called an annual well check. Dental exams once or twice a year. Routine eye exams. Ask your health care provider how often you should have your eyes checked. Personal lifestyle choices, including: Daily care of your teeth and gums. Regular physical activity. Eating a healthy diet. Avoiding tobacco and drug use. Limiting alcohol use. Practicing safe sex. Taking low-dose  aspirin every day. Taking vitamin and mineral supplements as recommended by your health care provider. What happens during an annual well check? The services and screenings done by your health care provider during your annual well check will depend on your age, overall health, lifestyle risk factors, and family history of disease. Counseling  Your health care provider may ask you questions about your: Alcohol use. Tobacco use. Drug use. Emotional well-being. Home and relationship well-being. Sexual activity. Eating habits. History of falls. Memory and ability to understand (cognition). Work and work Statistician. Reproductive health. Screening  You may have the following tests or measurements: Height, weight, and BMI. Blood pressure. Lipid and cholesterol levels. These may be checked every 5 years, or more frequently if you are over 34 years old. Skin check. Lung cancer screening. You may have this screening every year starting at age 64 if you have a 30-pack-year history of smoking and currently smoke or have quit within the past 15 years. Fecal occult blood test (FOBT) of the stool. You may have this test every year starting at age 74. Flexible sigmoidoscopy or colonoscopy. You may have a sigmoidoscopy every 5 years or a colonoscopy every 10 years starting at age 64. Hepatitis C blood test. Hepatitis B blood test. Sexually transmitted disease (STD) testing. Diabetes screening. This is done by checking your blood sugar (glucose) after you have not eaten for a while (fasting). You may have this done every 1-3 years. Bone density scan. This is done to screen for osteoporosis. You may have this done starting at age 60. Mammogram. This may be done every 1-2 years. Talk to your health care provider about how often you should have regular mammograms. Talk with your health care provider about your test results, treatment options, and if  necessary, the need for more tests. Vaccines  Your  health care provider may recommend certain vaccines, such as: Influenza vaccine. This is recommended every year. Tetanus, diphtheria, and acellular pertussis (Tdap, Td) vaccine. You may need a Td booster every 10 years. Zoster vaccine. You may need this after age 79. Pneumococcal 13-valent conjugate (PCV13) vaccine. One dose is recommended after age 22. Pneumococcal polysaccharide (PPSV23) vaccine. One dose is recommended after age 98. Talk to your health care provider about which screenings and vaccines you need and how often you need them. This information is not intended to replace advice given to you by your health care provider. Make sure you discuss any questions you have with your health care provider. Document Released: 07/02/2015 Document Revised: 02/23/2016 Document Reviewed: 04/06/2015 Elsevier Interactive Patient Education  2017 [employer] Prevention in the Home Falls can cause injuries. They can happen to people of all ages. There are many things you can do to make your home safe and to help prevent falls. What can I do on the outside of my home? Regularly fix the edges of walkways and driveways and fix any cracks. Remove anything that might make you trip as you walk through a door, such as a raised step or threshold. Trim any bushes or trees on the path to your home. Use bright outdoor lighting. Clear any walking paths of anything that might make someone trip, such as rocks or tools. Regularly check to see if handrails are loose or broken. Make sure that both sides of any steps have handrails. Any raised decks and porches should have guardrails on the edges. Have any leaves, snow, or ice cleared regularly. Use sand or salt on walking paths during winter. Clean up any spills in your garage right away. This includes oil or grease spills. What can I do in the bathroom? Use night lights. Install grab bars by the toilet and in the tub and shower. Do not use towel bars as  grab bars. Use non-skid mats or decals in the tub or shower. If you need to sit down in the shower, use a plastic, non-slip stool. Keep the floor dry. Clean up any water that spills on the floor as soon as it happens. Remove soap buildup in the tub or shower regularly. Attach bath mats securely with double-sided non-slip rug tape. Do not have throw rugs and other things on the floor that can make you trip. What can I do in the bedroom? Use night lights. Make sure that you have a light by your bed that is easy to reach. Do not use any sheets or blankets that are too big for your bed. They should not hang down onto the floor. Have a firm chair that has side arms. You can use this for support while you get dressed. Do not have throw rugs and other things on the floor that can make you trip. What can I do in the kitchen? Clean up any spills right away. Avoid walking on wet floors. Keep items that you use a lot in easy-to-reach places. If you need to reach something above you, use a strong step stool that has a grab bar. Keep electrical cords out of the way. Do not use floor polish or wax that makes floors slippery. If you must use wax, use non-skid floor wax. Do not have throw rugs and other things on the floor that can make you trip. What can I do with my stairs? Do not leave any items  on the stairs. Make sure that there are handrails on both sides of the stairs and use them. Fix handrails that are broken or loose. Make sure that handrails are as long as the stairways. Check any carpeting to make sure that it is firmly attached to the stairs. Fix any carpet that is loose or worn. Avoid having throw rugs at the top or bottom of the stairs. If you do have throw rugs, attach them to the floor with carpet tape. Make sure that you have a light switch at the top of the stairs and the bottom of the stairs. If you do not have them, ask someone to add them for you. What else can I do to help prevent  falls? Wear shoes that: Do not have high heels. Have rubber bottoms. Are comfortable and fit you well. Are closed at the toe. Do not wear sandals. If you use a stepladder: Make sure that it is fully opened. Do not climb a closed stepladder. Make sure that both sides of the stepladder are locked into place. Ask someone to hold it for you, if possible. Clearly mark and make sure that you can see: Any grab bars or handrails. First and last steps. Where the edge of each step is. Use tools that help you move around (mobility aids) if they are needed. These include: Canes. Walkers. Scooters. Crutches. Turn on the lights when you go into a dark area. Replace any light bulbs as soon as they burn out. Set up your furniture so you have a clear path. Avoid moving your furniture around. If any of your floors are uneven, fix them. If there are any pets around you, be aware of where they are. Review your medicines with your doctor. Some medicines can make you feel dizzy. This can increase your chance of falling. Ask your doctor what other things that you can do to help prevent falls. This information is not intended to replace advice given to you by your health care provider. Make sure you discuss any questions you have with your health care provider. Document Released: 04/01/2009 Document Revised: 11/11/2015 Document Reviewed: 07/10/2014 Elsevier Interactive Patient Education  2017 [employer].

## 2021-07-04 NOTE — Progress Notes (Addendum)
I connected with Park Meo today by telephone and verified that I am speaking with the correct person using two identifiers. Location patient: home Location provider: work Persons participating in the virtual visit: patient, provider.   I discussed the limitations, risks, security and privacy concerns of performing an evaluation and management service by telephone and the availability of in person appointments. I also discussed with the patient that there may be a patient responsible charge related to this service. The patient expressed understanding and verbally consented to this telephonic visit.    Interactive audio and video telecommunications were attempted between this provider and patient, however failed, due to patient having technical difficulties OR patient did not have access to video capability.  We continued and completed visit with audio only.  Some vital signs may be absent or patient reported.   Time Spent with patient on telephone encounter: 40 minutes  Subjective:   Sierra Johnson is a 71 y.o. female who presents for Medicare Annual (Subsequent) preventive examination.  Review of Systems     Cardiac Risk Factors include: advanced age (>37mn, >>60women);diabetes mellitus;dyslipidemia;family history of premature cardiovascular disease;hypertension     Objective:    Today's Vitals   07/04/21 1449  PainSc: 9    There is no height or weight on file to calculate BMI.  Advanced Directives 07/04/2021 05/31/2020 05/29/2019 01/27/2019 01/23/2019 12/05/2018 11/26/2018  Does Patient Have a Medical Advance Directive? _0  No No  Would patient like information on creating a medical advance directive? No - Patient declined No - Patient declined No - Patient declined No - Patient declined - No - Patient declined No - Patient declined    Current Medications (verified) Outpatient Encounter Medications as of 07/04/2021  Medication Sig   Accu-Chek FastClix Lancets MISC  USE TO CHECK BLOOD SUGARS TWICE A DAY   albuterol (VENTOLIN HFA) 108 (90 Base) MCG/ACT inhaler Inhale 1-2 puffs into the lungs every 6 (six) hours as needed for wheezing or shortness of breath.   apixaban (ELIQUIS) 2.5 MG TABS tablet Take 1 tablet (2.5 mg total) by mouth 2 (two) times daily.   azithromycin (ZITHROMAX Z-PAK) 250 MG tablet As directed   Blood Glucose Monitoring Suppl (ACCU-CHEK GUIDE) w/Device KIT 1 each by Does not apply route 2 (two) times daily. DX: E11.9   Cholecalciferol (VITAMIN D3) 50 MCG (2000 UT) capsule Take 1 capsule (2,000 Units total) by mouth daily.   glucose blood (ACCU-CHEK GUIDE) test strip UUSE TO CHECK BLOOD SUGARS TWICE A DAY   loratadine (CLARITIN) 10 MG tablet Take 1 tablet (10 mg total) by mouth daily.   metFORMIN (GLUCOPHAGE) 500 MG tablet TAKE 1 TABLET 2 TIMES DAILY WITH A MEAL. ANNUAL APPT IS DUE W/LABS MUST SEE PROVIDER REFILLS   olmesartan (BENICAR) 20 MG tablet Take 1 tablet (20 mg total) by mouth daily. Follow-[place] app[place] w/ LABS [place]   polyethylene glycol powder (GLYCOLAX/MIRALAX) 17 GM/SCOOP powder polyethylene glycol 3350 17 gram oral powder packet  TAKE 17 GRAMS BY MOUTH TWICE DAILY   pravastatin (PRAVACHOL) 20 MG tablet TAKE 1 TABLET BY MOUTH EVERY DAY   Semaglutide (RYBELSUS) 3 MG TABS Take 3 mg by mouth daily.   vitamin B-12 (CYANOCOBALAMIN) 500 MCG tablet Take 500 mcg by mouth daily after lunch.    No facility-administered encounter medications on file as of 07/04/2021.    Allergies (verified) Benazepril, Amlodipine, and Tetracycline   History: Past Medical History:  Diagnosis Date   ANEMIA-NOS 04/24/2007   Arthritis    Blood clot in vein    Cough    DIABETES MELLITUS, TYPE II 04/24/2007   Elevated glucose 2011   HYPERLIPIDEMIA 07/17/2007   mild   HYPERTENSION 04/24/2007   Knee pain    Menopause 1998   Dr. Gaetano Net   OBSTRUCTIVE SLEEP APNEA 09/30/2009   on CPAP 2011   Pneumonia    chronic  Brochitis and use inhaler   VITAMIN B12 DEFICIENCY 04/24/2007   Past Surgical History:  Procedure Laterality Date   ABDOMINAL HYSTERECTOMY     HERNIA REPAIR  11/11   Incar. midline hernia   INCISIONAL HERNIA REPAIR N/A 06/03/2014   Procedure: LAPAROSCOPIC REPAIR RECURRENT  INCISIONAL HERNIA;  Surgeon: Georganna Skeans, MD;  Location: [facility];  Service: General;  Laterality: N/A;   INSERTION OF MESH N/A 06/03/2014   Procedure: INSERTION OF MESH;  Surgeon: Georganna Skeans, MD;  Location: [facility];  Service: General;  Laterality: N/A;   TONSILLECTOMY     TOTAL KNEE ARTHROPLASTY Right 01/27/2019   Procedure: TOTAL KNEE ARTHROPLASTY;  Surgeon: Gaynelle Arabian, MD;  Location: [facility];  Service: Orthopedics;  Laterality: Right;  70mn   Family History  Problem Relation Age of Onset   Heart disease Mother 887      ?CAD   Lung cancer Father    Heart disease Brother 559      CAD   Hypertension Other    Colon cancer Neg Hx    Esophageal cancer Neg Hx    Stomach cancer Neg Hx    Pancreatic cancer Neg Hx    Liver disease Neg Hx    Social History   Socioeconomic History   Marital status: Married    Spouse name: RCarloyn Manner  Number of children: 2   Years of education: Not on file   Highest education level: Not on file  Occupational History   Occupation: AAeronautical engineer [employer]  Tobacco Use   Smoking status: Never   Smokeless tobacco: Never  Vaping Use   Vaping Use: Never used  Substance and Sexual Activity   Alcohol use: No   Drug use: No   Sexual activity: Yes  Other Topics Concern   Not on file  Social History Narrative   Vegan   Social Determinants of Health   Financial Resource Strain: Low Risk    Difficulty of Paying Living Expenses: Not hard at all  Food Insecurity: No Food Insecurity   Worried About RCharity fundraiserin the Last Year: Never true   [place]in the Last Year: Never true  Transportation Needs: No Transportation Needs   Lack of  Transportation (Medical): No   Lack of Transportation (Non-Medical): No  Physical Activity: Sufficiently Active   Days of Exercise per Week: 5 days   Minutes of Exercise per Session: 30 min  Stress: No Stress Concern Present   Feeling of Stress : Not at all  Social Connections: Not on file    Tobacco Counseling Counseling given: Not Answered   Clinical Intake:  Pre-visit preparation completed: Yes  Pain : 0-10 Pain Score: 9  Pain Type: Chronic pain Pain Location: Knee Pain Orientation: Left Pain Descriptors / Indicators: Discomfort Pain Onset: More than a month ago Pain Frequency: Intermittent Pain Relieving Factors: Tramadol Effect of Pain on Daily Activities: Pain produces disability and affects the quality of life.  Pain Relieving Factors: Tramadol  Nutritional Risks:  None Diabetes: Yes CBG done?: No Did pt. bring in CBG monitor from home?: No  How often do you need to have someone help you when you read instructions, pamphlets, or other written materials from your doctor or pharmacy?: 1 - Never What is the last grade level you completed in school?: College Degree  Diabetic? yes  Interpreter Needed?: No  Information entered by :: Lisette Abu, LPN   Activities of Daily Living In your present state of health, do you have any difficulty performing the following activities: 07/04/2021  Hearing? N  Vision? N  Difficulty concentrating or making decisions? N  Walking or climbing stairs? N  Dressing or bathing? N  Doing errands, shopping? N  Preparing Food and eating ? N  Using the Toilet? N  In the past six months, have you accidently leaked urine? N  Do you have problems with loss of bowel control? N  Managing your Medications? N  Managing your Finances? N  Housekeeping or managing your Housekeeping? N  Some recent data might be hidden    Patient Care Team: Plotnikov, [name], MD as PCP - Gaston Islam, MD as Consulting Physician  (Orthopedic Surgery) [name], MD as Consulting Physician (Pulmonary Disease) Laurence Aly, OD (Optometry)  Indicate any recent Medical Services you may have received from other than [facility] providers in the past year (date may be approximate).     Assessment:   This is a routine wellness examination for [place].  Hearing/Vision screen Hearing Screening - Comments:: Patient denied any hearing difficulty.   No hearing aids.  Vision Screening - Comments:: Patient wears corrective glasses/contacts.  Eye exam done annually by: Laurence Aly, OD at [place] issues and exercise activities discussed: Current Exercise Habits: Home exercise routine, Time (Minutes): 30, Frequency (Times/Week): 5, Weekly Exercise (Minutes/Week): 150, Intensity: Mild, Exercise limited by: orthopedic condition(s)   Goals Addressed               This Visit's Progress     Patient Stated (pt-stated)        My goal is to lose 30 pounds and continue my vegan diet/stay hydrated.      Depression Screen PHQ 2/9 Scores 07/04/2021 05/20/2021 05/31/2020 12/31/2019 03/27/2019 02/20/2018 12/22/2016  PHQ - 2 Score 0 0 0 1 0 0 0  PHQ- 9 Score - 4 - 3 - - -    Fall Risk Fall Risk  07/04/2021 05/20/2021 05/31/2020 02/20/2018 05/18/2015  Falls in the past year? 0 0 0 No No  Number falls in past yr: 0 0 0 - -  Injury with Fall? 0 0 0 - -  Risk for fall due to : No Fall Risks No Fall Risks No Fall Risks - -  Follow up - - Falls evaluation completed - -    FALL RISK PREVENTION PERTAINING TO THE HOME:  Any stairs in or around the home? No  If so, are there any without handrails? No  Home free of loose throw rugs in walkways, pet beds, electrical cords, etc? Yes  Adequate lighting in your home to reduce risk of falls? Yes   ASSISTIVE DEVICES UTILIZED TO PREVENT FALLS:  Life alert? No  Use of a cane, walker or w/c? No  Grab bars in the bathroom? No  Shower chair or bench in shower? No  Elevated  toilet seat or a handicapped toilet? No   TIMED UP AND GO:  Was the test performed? No .  Length of time  to ambulate 10 feet: n/a sec.   Gait steady and fast without use of assistive device  Cognitive Function: Normal cognitive status assessed by direct observation by this Nurse Health Advisor. No abnormalities found.          Immunizations Immunization History  Administered Date(s) Administered   Fluad Quad(high Dose 65+) 03/22/2019, 05/10/2020   Influenza, High Dose Seasonal PF 08/19/2018, 03/01/2021   Influenza-Unspecified 04/02/2017   PFIZER(Purple Top)SARS-COV-2 Vaccination 07/24/2019, 08/14/2019, 03/22/2020   PNEUMOCOCCAL CONJUGATE-20 06/08/2021   Pfizer Covid-19 Vaccine Bivalent Booster 21yr & up 03/08/2021   Pneumococcal Conjugate-13 05/07/2019   Tdap 05/07/2019   Zoster Recombinat (Shingrix) 06/05/2017, 08/10/2017    TDAP status: Up to date  Flu Vaccine status: Up to date  Pneumococcal vaccine status: Up to date  Covid-19 vaccine status: Completed vaccines  Qualifies for Shingles Vaccine? Yes   Zostavax completed No   Shingrix Completed?: Yes  Screening Tests Health Maintenance  Topic Date Due   MAMMOGRAM  11/17/2013   FOOT EXAM  01/08/2014   DEXA SCAN  Never done   OPHTHALMOLOGY EXAM  06/05/2018   HEMOGLOBIN A1C  11/07/2020   COLONOSCOPY (Pts 45-448yrInsurance coverage will need to be confirmed)  11/17/2028   TETANUS/TDAP  05/06/2029   Pneumonia Vaccine 6571Years old  Completed   INFLUENZA VACCINE  Completed   COVID-19 Vaccine  Completed   Hepatitis C Screening  Completed   Zoster Vaccines- Shingrix  Completed   HPV VACCINES  Aged Out    Health Maintenance  Health Maintenance Due  Topic Date Due   MAMMOGRAM  11/17/2013   FOOT EXAM  01/08/2014   DEXA SCAN  Never done   OPHTHALMOLOGY EXAM  06/05/2018   HEMOGLOBIN A1C  11/07/2020    Colorectal cancer screening: Type of screening: Colonoscopy. Completed 11/18/2018. Repeat every 10  years  Mammogram status: Completed 10/09/2019. Repeat every year  Bone Density status: Completed 10/09/2019. Results reflect: Bone density results: OSTEOPENIA. Repeat every 2-3 years.  Lung Cancer Screening: (Low Dose CT Chest recommended if Age 69-80ears, 30 pack-year currently smoking OR have quit w/in 15years.) does not qualify.   Lung Cancer Screening Referral: no  Additional Screening:  Hepatitis C Screening: does qualify; Completed yes  Vision Screening: Recommended annual ophthalmology exams for early detection of glaucoma and other disorders of the eye. Is the patient up to date with their annual eye exam?  Yes  Who is the provider or what is the name of the office in which the patient attends annual eye exams? [facility] with a provider, would they like to be referred to a provider to establish care? No .   Dental Screening: Recommended annual dental exams for proper oral hygiene  Community Resource Referral / Chronic Care Management: CRR required this visit?  No   CCM required this visit?  No      Plan:     I have personally reviewed and noted the following in the patients chart:   Medical and social history Use of alcohol, tobacco or illicit drugs  Current medications and supplements including opioid prescriptions.  Functional ability and status Nutritional status Physical activity Advanced directives List of other physicians Hospitalizations, surgeries, and ER visits in previous 12 months Vitals Screenings to include cognitive, depression, and falls Referrals and appointments  In addition, I have reviewed and discussed with patient certain preventive protocols, quality metrics, and best practice recommendations. A written personalized care plan for  preventive services as well as general preventive health recommendations were provided to patient.     Sheral Flow, LPN   [id number]   Nurse  Notes:  Patient is cogitatively intact. There were no vitals filed for this visit. There is no height or weight on file to calculate BMI. Patient stated that she has no issues with gait or balance; does not use any assistive devices. Medications reviewed with patient; no opioid use noted.  Medical screening examination/treatment/procedure(s) were performed by non-physician practitioner and as supervising physician I was immediately available for consultation/collaboration.  I agree with above. Lew Dawes, MD

## 2021-07-11 ENCOUNTER — Ambulatory Visit: Payer: BLUE CROSS/BLUE SHIELD

## 2021-07-25 NOTE — Progress Notes (Signed)
Surgery orders requested via Epic °

## 2021-07-28 NOTE — Patient Instructions (Addendum)
DUE TO COVID-19 ONLY ONE VISITOR IS ALLOWED TO COME WITH YOU AND STAY IN THE WAITING ROOM ONLY DURING PRE OP AND PROCEDURE.   **NO VISITORS ARE ALLOWED IN THE SHORT STAY AREA OR RECOVERY ROOM!!**  IF YOU WILL BE ADMITTED INTO THE HOSPITAL YOU ARE ALLOWED ONLY TWO SUPPORT PEOPLE DURING VISITATION HOURS ONLY (7 AM -8PM)    Up to two visitors ages 24+ are allowed at one time in a patient's room.  The visitors may rotate out with other people throughout the day.  Additionally, up to two children between the ages of 17 and 44 are allowed and do not count toward the number of allowed visitors.  Children within this age range must be accompanied by an adult visitor.  One adult visitor may remain with the patient overnight and must be in the room by 8 PM.  COVID SWAB TESTING MUST BE COMPLETED ON:  08-11-21 @ 12:00 PM  COME IN THROUGH MAIN ENTRANCE of [facility].  Take a seat in the lobby area to the right as you come in the main entrance.  Call [phone]  and give your name and let them know you are here for COVID testing  You are not required to quarantine, however you are required to wear a well-fitted mask when you are out and around people not in your household.  Hand Hygiene often Do NOT share personal items Notify your provider if you are in close contact with someone who has COVID or you develop fever 100.4 or greater, new onset of sneezing, cough, sore throat, shortness of breath or body aches.        Your procedure is scheduled on: Monday, 08-15-21   Report to [facility] Main  Entrance     Report to admitting at 5:15 AM   Call this number if you have problems the morning of surgery [phone]   Do not eat food :After Midnight.   May have liquids until 4:15 AM day of surgery  CLEAR LIQUID DIET  Foods Allowed                                                                     Foods Excluded  Water, Black Coffee (no milk/no creamer) and tea, regular and decaf                               liquids that you cannot  Plain Jell-O in any flavor  (No red)                         see through such as: Fruit ices (not with fruit pulp)                                 milk, soups, orange juice  Iced Popsicles (No red)                                    All solid food  Apple juices Sports drinks like Gatorade (No red) Lightly seasoned clear broth or consume(fat free) Sugar   Complete oneG2 drink the morning of surgery at 4:!5 AM the day of surgery.      The day of surgery:  Drink ONE (1) Pre-Surgery  G2 the morning of surgery. Drink in one sitting. Do not sip.  This drink was given to you during your hospital  pre-op appointment visit. Nothing else to drink after completing the Pre-Surgery  G2.          If you have questions, please contact your surgeons office.     Oral Hygiene is also important to reduce your risk of infection.                                    Remember - BRUSH YOUR TEETH THE MORNING OF SURGERY WITH YOUR REGULAR TOOTHPASTE   Do NOT smoke after Midnight   Take these medicines the morning of surgery with A SIP OF WATER:  Pravastatin,Tramadol, Ondansetron  DO NOT TAKE ANY ORAL DIABETIC MEDICATIONS DAY OF YOUR SURGERY                    Eliquis - stop 2 days prior to surgery   Stop all vitamins and herbal supplements a week before surgery.     Stop Motrin, Aleve, Ibuprofen a week before surgery.   Bring CPAP mask and tubing day of surgery             You may not have any metal on your body including hair pins, jewelry, and body piercing             Do not wear make-up, lotions, powders, perfumes or deodorant  Do not wear nail polish including gel and S&S, artificial/acrylic nails, or any other type of covering on natural nails including finger and toenails. If you have artificial nails, gel coating, etc. that needs to be removed by a nail salon please have this removed prior to surgery or surgery may need to  be canceled/ delayed if the surgeon/ anesthesia feels like they are unable to be safely monitored.   Do not shave  48 hours prior to surgery.      Contacts, dentures or bridgework may not be worn into surgery.  Bring small overnight bag day of surgery.  Do not bring valuables to the hospital. [place].    Please read over the following fact sheets you were given: IF YOU HAVE QUESTIONS ABOUT YOUR PRE OP INSTRUCTIONS PLEASE CALL [facility] - Preparing for Surgery Before surgery, you can play an important role.  Because skin is not sterile, your skin needs to be as free of germs as possible.  You can reduce the number of germs on your skin by washing with CHG (chlorahexidine gluconate) soap before surgery.  CHG is an antiseptic cleaner which kills germs and bonds with the skin to continue killing germs even after washing. Please DO NOT use if you have an allergy to CHG or antibacterial soaps.  If your skin becomes reddened/irritated stop using the CHG and inform your nurse when you arrive at Short Stay. Do not shave (including legs and underarms) for at least 48 hours prior to the first CHG shower.  You may shave your face/neck.  Please follow these instructions carefully:  1.  Shower with CHG  Soap the night before surgery and the  morning of surgery.  2.  If you choose to wash your hair, wash your hair first as usual with your normal  shampoo.  3.  After you shampoo, rinse your hair and body thoroughly to remove the shampoo.                             4.  Use CHG as you would any other liquid soap.  You can apply chg directly to the skin and wash.  Gently with a scrungie or clean washcloth.  5.  Apply the CHG Soap to your body ONLY FROM THE NECK DOWN.   Do   not use on face/ open                           Wound or open sores. Avoid contact with eyes, ears mouth and   genitals (private parts).                       Wash face,  Genitals  (private parts) with your normal soap.             6.  Wash thoroughly, paying special attention to the area where your    surgery  will be performed.  7.  Thoroughly rinse your body with warm water from the neck down.  8.  DO NOT shower/wash with your normal soap after using and rinsing off the CHG Soap.                9.  Pat yourself dry with a clean towel.            10.  Wear clean pajamas.            11.  Place clean sheets on your bed the night of your first shower and do not  sleep with pets. Day of Surgery : Do not apply any lotions/deodorants the morning of surgery.  Please wear clean clothes to the hospital/surgery center.  FAILURE TO FOLLOW THESE INSTRUCTIONS MAY RESULT IN THE CANCELLATION OF YOUR SURGERY  PATIENT SIGNATURE_________________________________  NURSE SIGNATURE__________________________________  ________________________________________________________________________   Adam Phenix  An incentive spirometer is a tool that can help keep your lungs clear and active. This tool measures how well you are filling your lungs with each breath. Taking long deep breaths may help reverse or decrease the chance of developing breathing (pulmonary) problems (especially infection) following: A long period of time when you are unable to move or be active. BEFORE THE PROCEDURE  If the spirometer includes an indicator to show your best effort, your nurse or respiratory therapist will set it to a desired goal. If possible, sit up straight or lean slightly forward. Try not to slouch. Hold the incentive spirometer in an upright position. INSTRUCTIONS FOR USE  Sit on the edge of your bed if possible, or sit up as far as you can in bed or on a chair. Hold the incentive spirometer in an upright position. Breathe out normally. Place the mouthpiece in your mouth and seal your lips tightly around it. Breathe in slowly and as deeply as possible, raising the piston or the ball toward  the top of the column. Hold your breath for 3-5 seconds or for as long as possible. Allow the piston or ball to fall to the bottom of the column. Remove the mouthpiece  from your mouth and breathe out normally. Rest for a few seconds and repeat Steps 1 through 7 at least 10 times every 1-2 hours when you are awake. Take your time and take a few normal breaths between deep breaths. The spirometer may include an indicator to show your best effort. Use the indicator as a goal to work toward during each repetition. After each set of 10 deep breaths, practice coughing to be sure your lungs are clear. If you have an incision (the cut made at the time of surgery), support your incision when coughing by placing a pillow or rolled up towels firmly against it. Once you are able to get out of bed, walk around indoors and cough well. You may stop using the incentive spirometer when instructed by your caregiver.  RISKS AND COMPLICATIONS Take your time so you do not get dizzy or light-headed. If you are in pain, you may need to take or ask for pain medication before doing incentive spirometry. It is harder to take a deep breath if you are having pain. AFTER USE Rest and breathe slowly and easily. It can be helpful to keep track of a log of your progress. Your caregiver can provide you with a simple table to help with this. If you are using the spirometer at home, follow these instructions: [place] IF:  You are having difficultly using the spirometer. You have trouble using the spirometer as often as instructed. Your pain medication is not giving enough relief while using the spirometer. You develop fever of 100.5 F (38.1 C) or higher. SEEK IMMEDIATE MEDICAL CARE IF:  You cough up bloody sputum that had not been present before. You develop fever of 102 F (38.9 C) or greater. You develop worsening pain at or near the incision site. MAKE SURE YOU:  Understand these instructions. Will watch your  condition. Will get help right away if you are not doing well or get worse. Document Released: 10/16/2006 Document Revised: 08/28/2011 Document Reviewed: 12/17/2006 ExitCare Patient Information 2014 Exit[employer], [place].   ________________________________________________________________________  WHAT IS A BLOOD TRANSFUSION? Blood Transfusion Information  A transfusion is the replacement of blood or some of its parts. Blood is made up of multiple cells which provide different functions. Red blood cells carry oxygen and are used for blood loss replacement. White blood cells fight against infection. Platelets control bleeding. Plasma helps clot blood. Other blood products are available for specialized needs, such as hemophilia or other clotting disorders. BEFORE THE TRANSFUSION  Who gives blood for transfusions?  Healthy volunteers who are fully evaluated to make sure their blood is safe. This is blood bank blood. Transfusion therapy is the safest it has ever been in the practice of medicine. Before blood is taken from a donor, a complete history is taken to make sure that person has no history of diseases nor engages in risky social behavior (examples are intravenous drug use or sexual activity with multiple partners). The donor's travel history is screened to minimize risk of transmitting infections, such as malaria. The donated blood is tested for signs of infectious diseases, such as HIV and hepatitis. The blood is then tested to be sure it is compatible with you in order to minimize the chance of a transfusion reaction. If you or a relative donates blood, this is often done in anticipation of surgery and is not appropriate for emergency situations. It takes many days to process the donated blood. RISKS AND COMPLICATIONS Although transfusion therapy is very safe  and saves many lives, the main dangers of transfusion include:  Getting an infectious disease. Developing a transfusion reaction. This is  an allergic reaction to something in the blood you were given. Every precaution is taken to prevent this. The decision to have a blood transfusion has been considered carefully by your caregiver before blood is given. Blood is not given unless the benefits outweigh the risks. AFTER THE TRANSFUSION Right after receiving a blood transfusion, you will usually feel much better and more energetic. This is especially true if your red blood cells have gotten low (anemic). The transfusion raises the level of the red blood cells which carry oxygen, and this usually causes an energy increase. The nurse administering the transfusion will monitor you carefully for complications. HOME CARE INSTRUCTIONS  No special instructions are needed after a transfusion. You may find your energy is better. Speak with your caregiver about any limitations on activity for underlying diseases you may have. SEEK MEDICAL CARE IF:  Your condition is not improving after your transfusion. You develop redness or irritation at the intravenous (IV) site. SEEK IMMEDIATE MEDICAL CARE IF:  Any of the following symptoms occur over the next 12 hours: Shaking chills. You have a temperature by mouth above 102 F (38.9 C), not controlled by medicine. Chest, back, or muscle pain. People around you feel you are not acting correctly or are confused. Shortness of breath or difficulty breathing. Dizziness and fainting. You get a rash or develop hives. You have a decrease in urine output. Your urine turns a dark color or changes to pink, red, or brown. Any of the following symptoms occur over the next 10 days: You have a temperature by mouth above 102 F (38.9 C), not controlled by medicine. Shortness of breath. Weakness after normal activity. The white part of the eye turns yellow (jaundice). You have a decrease in the amount of urine or are urinating less often. Your urine turns a dark color or changes to pink, red, or brown. Document  Released: 06/02/2000 Document Revised: 08/28/2011 Document Reviewed: 01/20/2008 [facility] Patient Information 2014 [place], [place].  _______________________________________________________________________

## 2021-07-28 NOTE — Progress Notes (Addendum)
COVID swab appointment:  08-12-21 @ 12:00 PM  COVID Vaccine Completed: Yes X2 Date COVID Vaccine completed: 07-24-19 08-14-19 Has received booster: Yes x2 03-22-20 03-08-21 COVID vaccine manufacturer: Pfizer      Date of COVID positive in last 90 days:  No  PCP - Lew Dawes, MD Cardiologist - NA  Medical clearance in Epic dated 05-20-21 by Dr. Alain Marion  Chest x-ray - N/A EKG - 05-20-21 Epic Stress Test - greater than 2 years Epic ECHO - greater than 2 years Epic Cardiac Cath - N/A Pacemaker/ICD device last checked: Spinal Cord Stimulator:  Bowel Prep - N/A  Sleep Study - Yes, +sleep apnea CPAP - Yes  Fasting Blood Sugar -  Checks Blood Sugar - Does not check   Blood Thinner Instructions: Eliquis. Stop 2 days prior to surgery.  Patient aware Aspirin Instructions: Last Dose:  Activity level:   Can go up a flight of stairs and perform activities of daily living without stopping and without symptoms of chest pain or shortness of breath.  Able to exercise without symptoms, patient does water aerobics   Anesthesia review:  N/A  Patient denies shortness of breath, fever, cough and chest pain at PAT appointment   Patient verbalized understanding of instructions that were given to them at the PAT appointment. Patient was also instructed that they will need to review over the PAT instructions again at home before surgery.

## 2021-08-03 ENCOUNTER — Other Ambulatory Visit: Payer: Self-pay

## 2021-08-03 ENCOUNTER — Encounter (HOSPITAL_COMMUNITY)
Admission: RE | Admit: 2021-08-03 | Discharge: 2021-08-03 | Disposition: A | Discharge status: Home / Self Care | Payer: Medicare Other | Source: Ambulatory Visit | Attending: Orthopedic Surgery | Admitting: Orthopedic Surgery

## 2021-08-03 ENCOUNTER — Encounter (HOSPITAL_COMMUNITY): Payer: Self-pay

## 2021-08-03 VITALS — BP 145/87 | HR 66 | Temp 98.6°F | Resp 18 | Ht 60.0 in | Wt 200.5 lb

## 2021-08-03 DIAGNOSIS — Z01812 Encounter for preprocedural laboratory examination: Secondary | ICD-10-CM | POA: Diagnosis not present

## 2021-08-03 DIAGNOSIS — M1712 Unilateral primary osteoarthritis, left knee: Secondary | ICD-10-CM | POA: Diagnosis not present

## 2021-08-03 DIAGNOSIS — E119 Type 2 diabetes mellitus without complications: Secondary | ICD-10-CM | POA: Diagnosis not present

## 2021-08-03 DIAGNOSIS — Z01818 Encounter for other preprocedural examination: Secondary | ICD-10-CM

## 2021-08-03 LAB — COMPREHENSIVE METABOLIC PANEL
ALT: 16 U/L (ref 0–44)
AST: 19 U/L (ref 15–41)
Albumin: 3.8 g/dL (ref 3.5–5.0)
Alkaline Phosphatase: 57 U/L (ref 38–126)
Anion gap: 7 (ref 5–15)
BUN: 8 mg/dL (ref 8–23)
CO2: 25 mmol/L (ref 22–32)
Calcium: 9.5 mg/dL (ref 8.9–10.3)
Chloride: 105 mmol/L (ref 98–111)
Creatinine, Ser: 0.8 mg/dL (ref 0.44–1.00)
GFR, Estimated: >60 mL/min (ref >60)
Glucose, Bld: 125 mg/dL — ABNORMAL HIGH (ref 70–99)
Potassium: 4.2 mmol/L (ref 3.5–5.1)
Sodium: 137 mmol/L (ref 135–145)
Total Bilirubin: 0.4 mg/dL (ref 0.3–1.2)
Total Protein: 7.3 g/dL (ref 6.5–8.1)

## 2021-08-03 LAB — CBC
HCT: 39.4 % (ref 36.0–46.0)
Hemoglobin: 12.6 g/dL (ref 12.0–15.0)
MCH: 28.6 pg (ref 26.0–34.0)
MCHC: 32 g/dL (ref 30.0–36.0)
MCV: 89.3 fL (ref 80.0–100.0)
Platelets: 266 10*3/uL (ref 150–400)
RBC: 4.41 MIL/uL (ref 3.87–5.11)
RDW: 14.1 % (ref 11.5–15.5)
WBC: 7.9 10*3/uL (ref 4.0–10.5)
nRBC: 0 % (ref 0.0–0.2)

## 2021-08-03 LAB — HEMOGLOBIN A1C
Hgb A1c MFr Bld: 6.2 % — ABNORMAL HIGH (ref 4.8–5.6)
Mean Plasma Glucose: 131.24 mg/dL

## 2021-08-03 LAB — TYPE AND SCREEN
ABO/RH(D): A POS
Antibody Screen: NEGATIVE

## 2021-08-03 LAB — PROTIME-INR
INR: 1.1 (ref 0.8–1.2)
Prothrombin Time: 14.6 seconds (ref 11.4–15.2)

## 2021-08-03 LAB — SURGICAL PCR SCREEN
MRSA, PCR: NEGATIVE
Staphylococcus aureus: NEGATIVE

## 2021-08-03 LAB — GLUCOSE, CAPILLARY: Glucose-Capillary: 129 mg/dL — ABNORMAL HIGH (ref 70–99)

## 2021-08-06 ENCOUNTER — Other Ambulatory Visit: Payer: Self-pay | Admitting: Internal Medicine

## 2021-08-07 ENCOUNTER — Other Ambulatory Visit: Payer: Self-pay | Admitting: Internal Medicine

## 2021-08-09 ENCOUNTER — Other Ambulatory Visit: Payer: Self-pay | Admitting: Internal Medicine

## 2021-08-10 NOTE — Telephone Encounter (Signed)
Patient is requesting a refill on olmesartan (BENICAR) 20 MG tablet. I called the patient to offer an appointment for her refills but patient states she is having knee surgery on Monday so unless Dr has an opening tomorrow or Friday, she can't schedule at this time (your schedule is full the rest of this week). Please advise.

## 2021-08-11 NOTE — Anesthesia Preprocedure Evaluation (Addendum)
Anesthesia Evaluation  Patient identified by MRN, date of birth, ID band Patient awake    Reviewed: Allergy & Precautions, NPO status , Patient's Chart, lab work & pertinent test results  Airway Mallampati: II  TM Distance: >3 FB Neck ROM: Full    Dental  (+) Teeth Intact, Dental Advisory Given   Pulmonary asthma , sleep apnea and Continuous Positive Airway Pressure Ventilation , PE (2019, eliquis) Chronic cough- occasionally productive, ever since her PE in 2017   Pulmonary exam normal breath sounds clear to auscultation       Cardiovascular hypertension, Pt. on medications Normal cardiovascular exam Rhythm:Regular Rate:Normal  Echo 2019: - Left ventricle: The cavity size was normal. Systolic function was  normal. The estimated ejection fraction was in the range of 60%  to 65%. Wall motion was normal; there were no regional wall  motion abnormalities. Doppler parameters are consistent with  abnormal left ventricular relaxation (grade 1 diastolic  dysfunction).    Neuro/Psych  Headaches, negative psych ROS   GI/Hepatic negative GI ROS, Neg liver ROS,   Endo/Other  diabetes, Well Controlled, Type 2, Oral Hypoglycemic AgentsObesity BMI 39 a1c 6.2  Renal/GU negative Renal ROS  negative genitourinary   Musculoskeletal  (+) Arthritis , Osteoarthritis,    Abdominal   Peds  Hematology hct 39.4, plt 266   Anesthesia Other Findings   Reproductive/Obstetrics negative OB ROS                           Anesthesia Physical Anesthesia Plan  ASA: 2  Anesthesia Plan: Regional and General   Post-op Pain Management: Regional block* and Tylenol PO (pre-op)*   Induction:   PONV Risk Score and Plan: 2 and Treatment may vary due to age or medical condition, Ondansetron, Dexamethasone and Midazolam  Airway Management Planned: LMA  Additional Equipment: None  Intra-op Plan:   Post-operative  Plan:   Informed Consent: I have reviewed the patients History and Physical, chart, labs and discussed the procedure including the risks, benefits and alternatives for the proposed anesthesia with the patient or authorized representative who has indicated his/her understanding and acceptance.     Dental advisory given  Plan Discussed with: CRNA  Anesthesia Plan Comments: (TKR 2020 under spinal no issues, unfortunately took last eliquis LD <72h ago we will do GA/LMA)      Anesthesia Quick Evaluation

## 2021-08-12 ENCOUNTER — Encounter (HOSPITAL_COMMUNITY)
Admission: RE | Admit: 2021-08-12 | Discharge: 2021-08-12 | Disposition: A | Discharge status: Home / Self Care | Payer: Medicare Other | Source: Ambulatory Visit | Attending: Orthopedic Surgery | Admitting: Orthopedic Surgery

## 2021-08-12 ENCOUNTER — Other Ambulatory Visit: Payer: Self-pay

## 2021-08-12 DIAGNOSIS — Z01812 Encounter for preprocedural laboratory examination: Secondary | ICD-10-CM | POA: Insufficient documentation

## 2021-08-12 DIAGNOSIS — Z01818 Encounter for other preprocedural examination: Secondary | ICD-10-CM

## 2021-08-12 DIAGNOSIS — Z20822 Contact with and (suspected) exposure to covid-19: Secondary | ICD-10-CM | POA: Diagnosis not present

## 2021-08-12 LAB — SARS CORONAVIRUS 2 (TAT 6-24 HRS): SARS Coronavirus 2: NEGATIVE

## 2021-08-14 NOTE — H&P (Signed)
TOTAL KNEE ADMISSION H&P  Patient is being admitted for left total knee arthroplasty.  Subjective:  Chief Complaint: Left knee pain.  HPI: Sierra Johnson, 71 y.o. female has a history of pain and functional disability in the left knee due to arthritis and has failed non-surgical conservative treatments for greater than 12 weeks to include NSAID's and/or analgesics and activity modification. Onset of symptoms was gradual, starting  several  years ago with gradually worsening course since that time. The patient noted no past surgery on the left knee.  Patient currently rates pain in the left knee at 6 out of 10 with activity. Patient has worsening of pain with activity and weight bearing, pain that interferes with activities of daily living, pain with passive range of motion, and crepitus. Patient has evidence of periarticular osteophytes and joint space narrowing by imaging studies. There is no active infection.  Patient Active Problem List   Diagnosis Date Noted   Decreased GFR 05/12/2020   Bronchiectasis without complication ([place]) 03/50/0938   Renal cyst 12/15/2019   CKD (chronic kidney disease) stage 3, GFR 30-59 ml/min ([facility]) 11/28/2019   History of total knee replacement, right 04/07/2019   History of community acquired pneumonia 11/29/2018   Sleep difficulties 11/29/2018   Preop exam for internal medicine 08/19/2018   Allergic rhinitis 08/19/2018   Hypoxemia 03/05/2018   History of pulmonary embolus (PE) 02/28/2018   Hyponatremia 02/28/2018   Cough 02/21/2018   Headache 01/30/2018   Asthmatic bronchitis 01/18/2018   Upper respiratory infection 04/27/2017   Insomnia 03/08/2016   Eczema 04/13/2015   Edema 02/11/2015   S/P laparoscopic hernia repair 06/03/2014   OA (osteoarthritis) of knee 04/30/2014   Recurrent ventral incisional hernia 10/16/2012   S/P repair of ventral hernia 08/28/2012   Diabetes mellitus type 2 in obese ([place]) 05/07/2012   Obesity, Class III, BMI 40-49.9  (morbid obesity) ([place]) 01/18/2012   Well adult exam 04/17/2011   Sinusitis, acute 04/17/2011   SHOULDER PAIN 12/07/2009   POLYURIA 12/07/2009   Obstructive sleep apnea 09/30/2009   FATIGUE 09/15/2009   PARESTHESIA 06/08/2008   Dyslipidemia 07/17/2007   B12 deficiency 04/24/2007   Anemia 04/24/2007   Essential hypertension 04/24/2007    Past Medical History:  Diagnosis Date   ANEMIA-NOS 04/24/2007   Arthritis    Blood clot in vein    Cough    DIABETES MELLITUS, TYPE II 04/24/2007   Elevated glucose 2011   HYPERLIPIDEMIA 07/17/2007   mild   HYPERTENSION 04/24/2007   Knee pain    Menopause 1998   Dr. Gaetano Net   OBSTRUCTIVE SLEEP APNEA 09/30/2009   on CPAP 2011   Pneumonia    chronic Brochitis and use inhaler   VITAMIN B12 DEFICIENCY 04/24/2007    Past Surgical History:  Procedure Laterality Date   ABDOMINAL HYSTERECTOMY     HERNIA REPAIR  04/2010   Incar. midline hernia   INCISIONAL HERNIA REPAIR N/A 06/03/2014   Procedure: LAPAROSCOPIC REPAIR RECURRENT  INCISIONAL HERNIA;  Surgeon: Georganna Skeans, MD;  Location: [facility];  Service: General;  Laterality: N/A;   INSERTION OF MESH N/A 06/03/2014   Procedure: INSERTION OF MESH;  Surgeon: Georganna Skeans, MD;  Location: [facility];  Service: General;  Laterality: N/A;   TONSILLECTOMY     TOTAL KNEE ARTHROPLASTY Right 01/27/2019   Procedure: TOTAL KNEE ARTHROPLASTY;  Surgeon: Gaynelle Arabian, MD;  Location: [facility];  Service: Orthopedics;  Laterality: Right;  51min   WISDOM TOOTH EXTRACTION  Prior to Admission medications   Medication Sig Start Date End Date Taking? Authorizing Provider  apixaban (ELIQUIS) 2.5 MG TABS tablet Take 1 tablet (2.5 mg total) by mouth 2 (two) times daily. 01/04/21  Yes Gorsuch, Ni, MD  chlorpheniramine (CHLOR-TRIMETON) 4 MG tablet Take 4 mg by mouth 3 (three) times daily.   Yes [provider]  Melatonin 10 MG CAPS Take 10 mg by mouth at bedtime as needed (sleep).   Yes [provider]   ondansetron (ZOFRAN) 4 MG tablet Take 4 mg by mouth every 6 (six) hours as needed for nausea/vomiting. 07/19/21  Yes [provider]  Accu-Chek FastClix Lancets MISC USE TO CHECK BLOOD SUGARS TWICE A DAY 10/09/18   Plotnikov, [name], MD  azithromycin (ZITHROMAX Z-PAK) 250 MG tablet As directed Patient not taking: Reported on 07/27/2021 05/20/21   Plotnikov, [name], MD  Blood Glucose Monitoring Suppl (ACCU-CHEK GUIDE) w/Device KIT 1 each by Does not apply route 2 (two) times daily. DX: E11.9 09/23/18   Plotnikov, [name], MD  glucose blood (ACCU-CHEK GUIDE) test strip UUSE TO CHECK BLOOD SUGARS TWICE A DAY 11/05/20   Plotnikov, [name], MD  loratadine (CLARITIN) 10 MG tablet Take 1 tablet (10 mg total) by mouth daily. Patient not taking: Reported on 07/27/2021 05/20/21   Plotnikov, [name], MD  metFORMIN (GLUCOPHAGE) 500 MG tablet TAKE 1 TABLET 2 TIMES DAILY WITH A MEAL. ANNUAL APPT IS DUE W/LABS MUST SEE PROVIDER REFILLS 08/08/21   Plotnikov, [name], MD  olmesartan (BENICAR) 20 MG tablet TAKE 1 TABLET (20 MG TOTAL) BY MOUTH DAILY. FOLLOW-UP APPT W/ LABS DUE IN JANUARY MUST SEE PROVIDER FOR FUTURE REFILLS 08/11/21   Plotnikov, [name], MD  pravastatin (PRAVACHOL) 20 MG tablet TAKE 1 TABLET BY MOUTH EVERY DAY 08/08/21   Plotnikov, [name], MD  Semaglutide (RYBELSUS) 3 MG TABS Take 3 mg by mouth daily. Patient not taking: Reported on 07/27/2021 05/20/21   Plotnikov, [name], MD    Allergies  Allergen Reactions   Benazepril Swelling and Other (See Comments)    Lip swelling   Amlodipine Swelling and Other (See Comments)    LE swelling on 10 mg   Tetracycline Nausea And Vomiting    Social History   Socioeconomic History   Marital status: Married    Spouse name: Carloyn Manner   Number of children: 2   Years of education: Not on file   Highest education level: Not on file  Occupational History   Occupation: Aeronautical engineer: [employer]  Tobacco Use   Smoking status:  Never   Smokeless tobacco: Never  Vaping Use   Vaping Use: Never used  Substance and Sexual Activity   Alcohol use: No   Drug use: No   Sexual activity: Yes    Birth control/protection: Surgical  Other Topics Concern   Not on file  Social History Narrative   Vegan   Social Determinants of Health   Financial Resource Strain: Low Risk    Difficulty of Paying Living Expenses: Not hard at all  Food Insecurity: No Food Insecurity   Worried About Charity fundraiser in the Last Year: Never true   [place] in the Last Year: Never true  Transportation Needs: No Transportation Needs   Lack of Transportation (Medical): No   Lack of Transportation (Non-Medical): No  Physical Activity: Sufficiently Active   Days of Exercise per Week: 5 days   Minutes of Exercise per Session:  30 min  Stress: No Stress Concern Present   Feeling of Stress : Not at all  Social Connections: Not on file  Intimate Partner Violence: Not At Risk   Fear of Current or Ex-Partner: No   Emotionally Abused: No   Physically Abused: No   Sexually Abused: No    Tobacco Use: Low Risk    Smoking Tobacco Use: Never   Smokeless Tobacco Use: Never   Passive Exposure: Not on file   Social History   Substance and Sexual Activity  Alcohol Use No    Family History  Problem Relation Age of Onset   Heart disease Mother 51       ?CAD   Lung cancer Father    Heart disease Brother 1       CAD   Hypertension Other    Colon cancer Neg Hx    Esophageal cancer Neg Hx    Stomach cancer Neg Hx    Pancreatic cancer Neg Hx    Liver disease Neg Hx     ROS: Constitutional: no fever, no chills, no night sweats, no significant weight loss Cardiovascular: no chest pain, no palpitations Respiratory: no cough, no shortness of breath, No COPD Gastrointestinal: no vomiting, no nausea Musculoskeletal: no swelling in Joints, Joint Pain Neurologic: no numbness, no tingling, no difficulty with  balance   Objective:  Physical Exam: Well nourished and well developed.  General: Alert and oriented x3, cooperative and pleasant, no acute distress.  Head: normocephalic, atraumatic, neck supple.  Eyes: EOMI.  Respiratory: breath sounds clear in all fields, no wheezing, rales, or rhonchi. Cardiovascular: Regular rate and rhythm, no murmurs, gallops or rubs.  Abdomen: non-tender to palpation and soft, normoactive bowel sounds. Musculoskeletal:   Left Knee Exam:   No effusion present. No swelling present.   The range of motion is: 0 to 125 degrees.   Moderate crepitus on range of motion of the knee.   Positive medial joint line tenderness.   No lateral joint line tenderness.   The knee is stable.  Calves soft and nontender. Motor function intact in LE. Strength 5/5 LE bilaterally. Neuro: Distal pulses 2+. Sensation to light touch intact in LE.    Vital signs in last 24 hours:    Imaging Review  Radiographs- AP and lateral of the left knee dated 11/2020 demonstrate bone-on-bone arthritis in the medial and patellofemoral compartments with varus deformity.   Assessment/Plan:  End stage arthritis, left knee   The patient history, physical examination, clinical judgment of the provider and imaging studies are consistent with end stage degenerative joint disease of the left knee and total knee arthroplasty is deemed medically necessary. The treatment options including medical management, injection therapy arthroscopy and arthroplasty were discussed at length. The risks and benefits of total knee arthroplasty were presented and reviewed. The risks due to aseptic loosening, infection, stiffness, patella tracking problems, thromboembolic complications and other imponderables were discussed. The patient acknowledged the explanation, agreed to proceed with the plan and consent was signed. Patient is being admitted for inpatient treatment for surgery, pain control, PT, OT, prophylactic  antibiotics, VTE prophylaxis, progressive ambulation and ADLs and discharge planning. The patient is planning to be discharged  home .   Patient's anticipated LOS is less than 2 midnights, meeting these requirements: - Younger than 40 - Lives within 1 hour of care - Has a competent adult at home to recover with post-op recover - NO history of  - Chronic pain requiring opiods  -  Diabetes  - Coronary Artery Disease  - Heart failure  - Heart attack  - Stroke  - DVT/VTE  - Cardiac arrhythmia  - Respiratory Failure/COPD  - Renal failure  - Anemia  - Advanced Liver disease    Therapy Plans: EO Disposition: Home with Husband Planned DVT Prophylaxis: Eliquis 2.5mg  BID DME Needed: None PCP: Dr. Alain Marion TXA: IV Allergies: Amlodipine (swelling), Benazepril (swelling), Tetracycline (N/V) Anesthesia Concerns: Nausea Vomiting  BMI: 41.3 - Weight check on 08/11/21 Last HgbA1c: 6.6  Pharmacy: [facility]  - Patient was instructed on what medications to stop prior to surgery. - Follow-up visit in 2 weeks with Dr. Wynelle Link - Begin physical therapy following surgery - Pre-operative lab work as pre-surgical testing - Prescriptions will be provided in hospital at time of discharge  Fenton Foy, [facility], PA-C Orthopedic Surgery [facility] [place]

## 2021-08-15 ENCOUNTER — Encounter (HOSPITAL_COMMUNITY): Payer: Self-pay | Admitting: Orthopedic Surgery

## 2021-08-15 ENCOUNTER — Ambulatory Visit (HOSPITAL_COMMUNITY): Payer: Medicare Other | Admitting: Anesthesiology

## 2021-08-15 ENCOUNTER — Observation Stay (HOSPITAL_COMMUNITY)
Admission: RE | Admit: 2021-08-15 | Discharge: 2021-08-16 | Disposition: A | Discharge status: Home / Self Care | Payer: Medicare Other | Attending: Orthopedic Surgery | Admitting: Orthopedic Surgery

## 2021-08-15 ENCOUNTER — Encounter (HOSPITAL_COMMUNITY)
Admission: RE | Disposition: A | Discharge status: Home / Self Care | Payer: Self-pay | Source: Home / Self Care | Attending: Orthopedic Surgery

## 2021-08-15 ENCOUNTER — Other Ambulatory Visit: Payer: Self-pay

## 2021-08-15 ENCOUNTER — Ambulatory Visit (HOSPITAL_BASED_OUTPATIENT_CLINIC_OR_DEPARTMENT_OTHER): Payer: Medicare Other | Admitting: Anesthesiology

## 2021-08-15 DIAGNOSIS — M1712 Unilateral primary osteoarthritis, left knee: Principal | ICD-10-CM

## 2021-08-15 DIAGNOSIS — Z7984 Long term (current) use of oral hypoglycemic drugs: Secondary | ICD-10-CM | POA: Diagnosis not present

## 2021-08-15 DIAGNOSIS — Z7901 Long term (current) use of anticoagulants: Secondary | ICD-10-CM | POA: Diagnosis not present

## 2021-08-15 DIAGNOSIS — G8918 Other acute postprocedural pain: Secondary | ICD-10-CM | POA: Diagnosis not present

## 2021-08-15 DIAGNOSIS — Z86711 Personal history of pulmonary embolism: Secondary | ICD-10-CM | POA: Diagnosis not present

## 2021-08-15 DIAGNOSIS — Z96651 Presence of right artificial knee joint: Secondary | ICD-10-CM | POA: Insufficient documentation

## 2021-08-15 DIAGNOSIS — Z79899 Other long term (current) drug therapy: Secondary | ICD-10-CM | POA: Diagnosis not present

## 2021-08-15 DIAGNOSIS — N183 Chronic kidney disease, stage 3 unspecified: Secondary | ICD-10-CM | POA: Diagnosis not present

## 2021-08-15 DIAGNOSIS — E1122 Type 2 diabetes mellitus with diabetic chronic kidney disease: Secondary | ICD-10-CM | POA: Insufficient documentation

## 2021-08-15 DIAGNOSIS — I129 Hypertensive chronic kidney disease with stage 1 through stage 4 chronic kidney disease, or unspecified chronic kidney disease: Secondary | ICD-10-CM | POA: Diagnosis not present

## 2021-08-15 HISTORY — PX: TOTAL KNEE ARTHROPLASTY: SHX125

## 2021-08-15 LAB — GLUCOSE, CAPILLARY
Glucose-Capillary: 120 mg/dL — ABNORMAL HIGH (ref 70–99)
Glucose-Capillary: 167 mg/dL — ABNORMAL HIGH (ref 70–99)
Glucose-Capillary: 177 mg/dL — ABNORMAL HIGH (ref 70–99)
Glucose-Capillary: 274 mg/dL — ABNORMAL HIGH (ref 70–99)
Glucose-Capillary: 231 mg/dL — ABNORMAL HIGH (ref 70–99)

## 2021-08-15 SURGERY — ARTHROPLASTY, KNEE, TOTAL
Anesthesia: Regional | Site: Knee | Laterality: Left

## 2021-08-15 MED ORDER — DEXAMETHASONE SODIUM PHOSPHATE 10 MG/ML IJ SOLN
INTRAMUSCULAR | Status: DC | PRN
  Administered 2021-08-15: 10 mg via INTRAVENOUS

## 2021-08-15 MED ORDER — DOCUSATE SODIUM 100 MG PO CAPS
100.0000 mg | ORAL_CAPSULE | Freq: Two times a day (BID) | ORAL | Status: DC
  Administered 2021-08-15 – 2021-08-16 (×3): 100 mg via ORAL
  Filled 2021-08-15 (×3): qty 1

## 2021-08-15 MED ORDER — DEXAMETHASONE SODIUM PHOSPHATE 10 MG/ML IJ SOLN
8.0000 mg | Freq: Once | INTRAMUSCULAR | Status: AC
  Administered 2021-08-15: 6 mg via INTRAVENOUS
  Administered 2021-08-15: 4 mg via INTRAVENOUS

## 2021-08-15 MED ORDER — PHENOL 1.4 % MT LIQD: 1.0000 | OROMUCOSAL | Status: DC | PRN

## 2021-08-15 MED ORDER — BUPIVACAINE LIPOSOME 1.3 % IJ SUSP
INTRAMUSCULAR | Status: AC
  Filled 2021-08-15: qty 20

## 2021-08-15 MED ORDER — METOCLOPRAMIDE HCL 5 MG/ML IJ SOLN: 5.0000 mg | Freq: Three times a day (TID) | INTRAMUSCULAR | Status: DC | PRN

## 2021-08-15 MED ORDER — SODIUM CHLORIDE (PF) 0.9 % IJ SOLN
INTRAMUSCULAR | Status: AC
  Filled 2021-08-15: qty 10

## 2021-08-15 MED ORDER — PROPOFOL 10 MG/ML IV BOLUS
INTRAVENOUS | Status: AC
  Filled 2021-08-15: qty 20

## 2021-08-15 MED ORDER — EPHEDRINE 5 MG/ML INJ
INTRAVENOUS | Status: AC
  Filled 2021-08-15: qty 10

## 2021-08-15 MED ORDER — PROPOFOL 10 MG/ML IV BOLUS
INTRAVENOUS | Status: DC | PRN
  Administered 2021-08-15: 200 mg via INTRAVENOUS
  Administered 2021-08-15: 160 mg via INTRAVENOUS
  Administered 2021-08-15: 40 mg via INTRAVENOUS

## 2021-08-15 MED ORDER — LACTATED RINGERS IV SOLN: INTRAVENOUS | Status: DC

## 2021-08-15 MED ORDER — PRAVASTATIN SODIUM 20 MG PO TABS
20.0000 mg | ORAL_TABLET | Freq: Every day | ORAL | Status: DC
Start: 2021-08-16 — End: 2021-08-16
  Administered 2021-08-16: 20 mg via ORAL
  Filled 2021-08-15: qty 1

## 2021-08-15 MED ORDER — ONDANSETRON HCL 4 MG PO TABS: 4.0000 mg | ORAL_TABLET | Freq: Four times a day (QID) | ORAL | Status: DC | PRN

## 2021-08-15 MED ORDER — TRANEXAMIC ACID-NACL 1000-0.7 MG/100ML-% IV SOLN
1000.0000 mg | INTRAVENOUS | Status: DC
  Filled 2021-08-15: qty 100

## 2021-08-15 MED ORDER — HYDROMORPHONE HCL 2 MG/ML IJ SOLN
INTRAMUSCULAR | Status: AC
  Filled 2021-08-15: qty 1

## 2021-08-15 MED ORDER — CEFAZOLIN SODIUM-DEXTROSE 2-4 GM/100ML-% IV SOLN
2.0000 g | Freq: Four times a day (QID) | INTRAVENOUS | Status: AC
  Administered 2021-08-15 (×2): 2 g via INTRAVENOUS
  Filled 2021-08-15 (×2): qty 100

## 2021-08-15 MED ORDER — SODIUM CHLORIDE (PF) 0.9 % IJ SOLN
INTRAMUSCULAR | Status: AC
  Filled 2021-08-15: qty 50

## 2021-08-15 MED ORDER — PHENYLEPHRINE 40 MCG/ML (10ML) SYRINGE FOR IV PUSH (FOR BLOOD PRESSURE SUPPORT)
PREFILLED_SYRINGE | INTRAVENOUS | Status: DC | PRN
  Administered 2021-08-15: 80 ug via INTRAVENOUS
  Administered 2021-08-15: 40 ug via INTRAVENOUS
  Administered 2021-08-15 (×4): 80 ug via INTRAVENOUS

## 2021-08-15 MED ORDER — METOCLOPRAMIDE HCL 5 MG PO TABS: 5.0000 mg | ORAL_TABLET | Freq: Three times a day (TID) | ORAL | Status: DC | PRN

## 2021-08-15 MED ORDER — ACETAMINOPHEN 500 MG PO TABS
1000.0000 mg | ORAL_TABLET | Freq: Four times a day (QID) | ORAL | Status: AC
  Administered 2021-08-15 – 2021-08-16 (×4): 1000 mg via ORAL
  Filled 2021-08-15 (×4): qty 2

## 2021-08-15 MED ORDER — MIDAZOLAM HCL 2 MG/2ML IJ SOLN
INTRAMUSCULAR | Status: DC | PRN
  Administered 2021-08-15: 2 mg via INTRAVENOUS

## 2021-08-15 MED ORDER — FENTANYL CITRATE (PF) 100 MCG/2ML IJ SOLN
INTRAMUSCULAR | Status: DC | PRN
  Administered 2021-08-15 (×4): 50 ug via INTRAVENOUS

## 2021-08-15 MED ORDER — ONDANSETRON HCL 4 MG/2ML IJ SOLN
INTRAMUSCULAR | Status: DC | PRN
  Administered 2021-08-15: 4 mg via INTRAVENOUS

## 2021-08-15 MED ORDER — SUCCINYLCHOLINE CHLORIDE 200 MG/10ML IV SOSY
PREFILLED_SYRINGE | INTRAVENOUS | Status: AC
  Filled 2021-08-15: qty 10

## 2021-08-15 MED ORDER — INSULIN ASPART 100 UNIT/ML IJ SOLN
0.0000 [IU] | Freq: Every day | INTRAMUSCULAR | Status: DC
  Administered 2021-08-15: 2 [IU] via SUBCUTANEOUS

## 2021-08-15 MED ORDER — SODIUM CHLORIDE 0.9 % IR SOLN
Status: DC | PRN
  Administered 2021-08-15: 1000 mL

## 2021-08-15 MED ORDER — OXYCODONE HCL 5 MG/5ML PO SOLN: 5.0000 mg | Freq: Once | ORAL | Status: DC | PRN

## 2021-08-15 MED ORDER — OXYCODONE HCL 5 MG PO TABS: 5.0000 mg | ORAL_TABLET | Freq: Once | ORAL | Status: DC | PRN

## 2021-08-15 MED ORDER — MIDAZOLAM HCL 2 MG/2ML IJ SOLN
INTRAMUSCULAR | Status: AC
  Filled 2021-08-15: qty 2

## 2021-08-15 MED ORDER — ONDANSETRON HCL 4 MG/2ML IJ SOLN
INTRAMUSCULAR | Status: AC
  Filled 2021-08-15: qty 2

## 2021-08-15 MED ORDER — EPHEDRINE SULFATE-NACL 50-0.9 MG/10ML-% IV SOSY
PREFILLED_SYRINGE | INTRAVENOUS | Status: DC | PRN
  Administered 2021-08-15: 5 mg via INTRAVENOUS
  Administered 2021-08-15 (×3): 10 mg via INTRAVENOUS

## 2021-08-15 MED ORDER — CEFAZOLIN SODIUM-DEXTROSE 2-4 GM/100ML-% IV SOLN
2.0000 g | INTRAVENOUS | Status: AC
  Administered 2021-08-15: 2 g via INTRAVENOUS
  Filled 2021-08-15: qty 100

## 2021-08-15 MED ORDER — BUPIVACAINE LIPOSOME 1.3 % IJ SUSP: 20.0000 mL | Freq: Once | INTRAMUSCULAR | Status: DC

## 2021-08-15 MED ORDER — METHOCARBAMOL 500 MG PO TABS
500.0000 mg | ORAL_TABLET | Freq: Four times a day (QID) | ORAL | Status: DC | PRN
  Administered 2021-08-15: 500 mg via ORAL

## 2021-08-15 MED ORDER — ACETAMINOPHEN 10 MG/ML IV SOLN
1000.0000 mg | Freq: Four times a day (QID) | INTRAVENOUS | Status: DC
  Administered 2021-08-15: 1000 mg via INTRAVENOUS
  Filled 2021-08-15: qty 100

## 2021-08-15 MED ORDER — HYDROMORPHONE HCL 1 MG/ML IJ SOLN: 0.2500 mg | INTRAMUSCULAR | Status: DC | PRN

## 2021-08-15 MED ORDER — FENTANYL CITRATE (PF) 100 MCG/2ML IJ SOLN
INTRAMUSCULAR | Status: AC
  Filled 2021-08-15: qty 2

## 2021-08-15 MED ORDER — GABAPENTIN 300 MG PO CAPS
300.0000 mg | ORAL_CAPSULE | Freq: Three times a day (TID) | ORAL | Status: DC
  Administered 2021-08-15 – 2021-08-16 (×3): 300 mg via ORAL
  Filled 2021-08-15 (×4): qty 1

## 2021-08-15 MED ORDER — SUCCINYLCHOLINE CHLORIDE 200 MG/10ML IV SOSY
PREFILLED_SYRINGE | INTRAVENOUS | Status: DC | PRN
  Administered 2021-08-15: 100 mg via INTRAVENOUS

## 2021-08-15 MED ORDER — ONDANSETRON HCL 4 MG/2ML IJ SOLN: 4.0000 mg | Freq: Once | INTRAMUSCULAR | Status: DC | PRN

## 2021-08-15 MED ORDER — ONDANSETRON HCL 4 MG/2ML IJ SOLN: 4.0000 mg | Freq: Four times a day (QID) | INTRAMUSCULAR | Status: DC | PRN

## 2021-08-15 MED ORDER — MORPHINE SULFATE (PF) 2 MG/ML IV SOLN: 0.5000 mg | INTRAVENOUS | Status: DC | PRN

## 2021-08-15 MED ORDER — POVIDONE-IODINE 10 % EX SWAB
2.0000 "application " | Freq: Once | CUTANEOUS | Status: AC
  Administered 2021-08-15: 2 via TOPICAL

## 2021-08-15 MED ORDER — TRANEXAMIC ACID 1000 MG/10ML IV SOLN
2000.0000 mg | Freq: Once | INTRAVENOUS | Status: AC
  Administered 2021-08-15: 2000 mg via TOPICAL
  Filled 2021-08-15: qty 20

## 2021-08-15 MED ORDER — MENTHOL 3 MG MT LOZG: 1.0000 | LOZENGE | OROMUCOSAL | Status: DC | PRN

## 2021-08-15 MED ORDER — PHENYLEPHRINE 40 MCG/ML (10ML) SYRINGE FOR IV PUSH (FOR BLOOD PRESSURE SUPPORT)
PREFILLED_SYRINGE | INTRAVENOUS | Status: AC
  Filled 2021-08-15: qty 10

## 2021-08-15 MED ORDER — METHOCARBAMOL 500 MG IVPB - SIMPLE MED
500.0000 mg | Freq: Four times a day (QID) | INTRAVENOUS | Status: DC | PRN
  Filled 2021-08-15 (×2): qty 50

## 2021-08-15 MED ORDER — IRBESARTAN 150 MG PO TABS
150.0000 mg | ORAL_TABLET | Freq: Every day | ORAL | Status: DC
  Administered 2021-08-16: 150 mg via ORAL
  Filled 2021-08-15: qty 1

## 2021-08-15 MED ORDER — HYDROMORPHONE HCL 1 MG/ML IJ SOLN
INTRAMUSCULAR | Status: DC | PRN
  Administered 2021-08-15 (×2): .5 mg via INTRAVENOUS

## 2021-08-15 MED ORDER — BUPIVACAINE HCL (PF) 0.5 % IJ SOLN
INTRAMUSCULAR | Status: DC | PRN
  Administered 2021-08-15: 30 mL via PERINEURAL

## 2021-08-15 MED ORDER — BUPIVACAINE LIPOSOME 1.3 % IJ SUSP
INTRAMUSCULAR | Status: DC | PRN
  Administered 2021-08-15: 20 mL

## 2021-08-15 MED ORDER — INSULIN ASPART 100 UNIT/ML IJ SOLN
0.0000 [IU] | Freq: Three times a day (TID) | INTRAMUSCULAR | Status: DC
  Administered 2021-08-15: 3 [IU] via SUBCUTANEOUS
  Administered 2021-08-15: 8 [IU] via SUBCUTANEOUS
  Administered 2021-08-16: 2 [IU] via SUBCUTANEOUS

## 2021-08-15 MED ORDER — DEXAMETHASONE SODIUM PHOSPHATE 10 MG/ML IJ SOLN
INTRAMUSCULAR | Status: AC
  Filled 2021-08-15: qty 1

## 2021-08-15 MED ORDER — APIXABAN 2.5 MG PO TABS
2.5000 mg | ORAL_TABLET | Freq: Two times a day (BID) | ORAL | Status: DC
  Administered 2021-08-16: 2.5 mg via ORAL
  Filled 2021-08-15: qty 1

## 2021-08-15 MED ORDER — OXYCODONE HCL 5 MG PO TABS
5.0000 mg | ORAL_TABLET | ORAL | Status: DC | PRN
  Administered 2021-08-15 – 2021-08-16 (×2): 10 mg via ORAL
  Filled 2021-08-15 (×5): qty 2

## 2021-08-15 MED ORDER — ACETAMINOPHEN 500 MG PO TABS: 1000.0000 mg | ORAL_TABLET | Freq: Once | ORAL | Status: DC

## 2021-08-15 MED ORDER — CHLORHEXIDINE GLUCONATE 0.12 % MT SOLN
15.0000 mL | Freq: Once | OROMUCOSAL | Status: AC
  Administered 2021-08-15: 15 mL via OROMUCOSAL

## 2021-08-15 MED ORDER — TRAMADOL HCL 50 MG PO TABS: 50.0000 mg | ORAL_TABLET | Freq: Four times a day (QID) | ORAL | Status: DC | PRN

## 2021-08-15 MED ORDER — ORAL CARE MOUTH RINSE: 15.0000 mL | Freq: Once | OROMUCOSAL | Status: AC

## 2021-08-15 MED ORDER — LIDOCAINE 2% (20 MG/ML) 5 ML SYRINGE
INTRAMUSCULAR | Status: DC | PRN
Start: 2021-08-15 — End: 2021-08-15
  Administered 2021-08-15: 60 mg via INTRAVENOUS

## 2021-08-15 MED ORDER — SODIUM CHLORIDE (PF) 0.9 % IJ SOLN
INTRAMUSCULAR | Status: DC | PRN
  Administered 2021-08-15: 60 mL

## 2021-08-15 SURGICAL SUPPLY — 56 items
ATTUNE MED DOME PAT 32 KNEE (Knees) ×1 IMPLANT
ATTUNE PSFEM LTSZ5 NARCEM KNEE (Femur) ×1 IMPLANT
ATTUNE PSRP INSR SZ5 8 KNEE (Insert) ×1 IMPLANT
BAG COUNTER SPONGE SURGICOUNT (BAG) IMPLANT
BAG SPEC THK2 15X12 ZIP CLS (MISCELLANEOUS) ×1
BAG SPNG CNTER NS LX DISP (BAG)
BAG ZIPLOCK 12X15 (MISCELLANEOUS) ×2 IMPLANT
BASEPLATE TIBIAL ROTATING SZ 4 (Knees) ×1 IMPLANT
BLADE SAG 18X100X1.27 (BLADE) ×2 IMPLANT
BLADE SAW SGTL 11.0X1.19X90.0M (BLADE) ×2 IMPLANT
BNDG CMPR MED 10X6 ELC LF (GAUZE/BANDAGES/DRESSINGS) ×1
BNDG ELASTIC 6X10 VLCR STRL LF (GAUZE/BANDAGES/DRESSINGS) ×1 IMPLANT
BNDG ELASTIC 6X5.8 VLCR STR LF (GAUZE/BANDAGES/DRESSINGS) ×2 IMPLANT
BOWL SMART MIX CTS (DISPOSABLE) ×2 IMPLANT
BSPLAT TIB 4 CMNT ROT PLAT STR (Knees) ×1 IMPLANT
CEMENT HV SMART SET (Cement) ×4 IMPLANT
COVER SURGICAL LIGHT HANDLE (MISCELLANEOUS) ×2 IMPLANT
CUFF TOURN SGL QUICK 34 (TOURNIQUET CUFF) ×2
CUFF TRNQT CYL 34X4.125X (TOURNIQUET CUFF) ×1 IMPLANT
DRAPE INCISE IOBAN 66X45 STRL (DRAPES) ×2 IMPLANT
DRAPE U-SHAPE 47X51 STRL (DRAPES) ×2 IMPLANT
DRSG AQUACEL AG ADV 3.5X10 (GAUZE/BANDAGES/DRESSINGS) ×2 IMPLANT
DURAPREP 26ML APPLICATOR (WOUND CARE) ×2 IMPLANT
ELECT REM PT RETURN 15FT ADLT (MISCELLANEOUS) ×2 IMPLANT
GLOVE SRG 8 PF TXTR STRL LF DI (GLOVE) ×1 IMPLANT
GLOVE SURG ENC MOIS LTX SZ6.5 (GLOVE) ×2 IMPLANT
GLOVE SURG ENC MOIS LTX SZ8 (GLOVE) ×4 IMPLANT
GLOVE SURG UNDER POLY LF SZ7 (GLOVE) ×2 IMPLANT
GLOVE SURG UNDER POLY LF SZ8 (GLOVE) ×2
GLOVE SURG UNDER POLY LF SZ8.5 (GLOVE) ×2 IMPLANT
GOWN STRL REUS W/TWL LRG LVL3 (GOWN DISPOSABLE) ×2 IMPLANT
GOWN STRL REUS W/TWL XL LVL3 (GOWN DISPOSABLE) IMPLANT
HANDPIECE INTERPULSE COAX TIP (DISPOSABLE) ×2
HOLDER FOLEY CATH W/STRAP (MISCELLANEOUS) IMPLANT
IMMOBILIZER KNEE 20 (SOFTGOODS) ×2
IMMOBILIZER KNEE 20 THIGH 36 (SOFTGOODS) ×1 IMPLANT
KIT TURNOVER KIT A (KITS) IMPLANT
MANIFOLD NEPTUNE II (INSTRUMENTS) ×2 IMPLANT
NS IRRIG 1000ML POUR BTL (IV SOLUTION) ×2 IMPLANT
PACK TOTAL KNEE CUSTOM (KITS) ×2 IMPLANT
PADDING CAST COTTON 6X4 STRL (CAST SUPPLIES) ×3 IMPLANT
PROTECTOR NERVE ULNAR (MISCELLANEOUS) ×2 IMPLANT
SET HNDPC FAN SPRY TIP SCT (DISPOSABLE) ×1 IMPLANT
SPIKE FLUID TRANSFER (MISCELLANEOUS) ×2 IMPLANT
SPONGE T-LAP 18X18 ~~LOC~~+RFID (SPONGE) ×4 IMPLANT
STRIP CLOSURE SKIN 1/2X4 (GAUZE/BANDAGES/DRESSINGS) ×4 IMPLANT
SUT MNCRL AB 4-0 PS2 18 (SUTURE) ×2 IMPLANT
SUT STRATAFIX 0 PDS 27 VIOLET (SUTURE) ×2
SUT VIC AB 2-0 CT1 27 (SUTURE) ×6
SUT VIC AB 2-0 CT1 TAPERPNT 27 (SUTURE) ×3 IMPLANT
SUTURE STRATFX 0 PDS 27 VIOLET (SUTURE) ×1 IMPLANT
TAPE STRIPS DRAPE STRL (GAUZE/BANDAGES/DRESSINGS) ×1 IMPLANT
TRAY FOLEY MTR SLVR 16FR STAT (SET/KITS/TRAYS/PACK) ×2 IMPLANT
TUBE SUCTION HIGH CAP CLEAR NV (SUCTIONS) ×2 IMPLANT
WATER STERILE IRR 1000ML POUR (IV SOLUTION) ×4 IMPLANT
WRAP KNEE MAXI GEL POST OP (GAUZE/BANDAGES/DRESSINGS) ×2 IMPLANT

## 2021-08-15 NOTE — Transfer of Care (Signed)
Immediate Anesthesia Transfer of Care Note  Patient: Sierra Johnson  Procedure(s) Performed: TOTAL KNEE ARTHROPLASTY (Left: Knee)  Patient Location: PACU  Anesthesia Type:General  Level of Consciousness: awake, drowsy and patient cooperative  Airway & Oxygen Therapy: Patient Spontanous Breathing and Patient connected to face mask oxygen  Post-op Assessment: Report given to RN and Post -op Vital signs reviewed and stable  Post vital signs: Reviewed and stable  Last Vitals:  Vitals Value Taken Time  BP 122/69 08/15/21 0853  Temp    Pulse 105 08/15/21 0855  Resp 29 08/15/21 0855  SpO2 100 % 08/15/21 0855  Vitals shown include unvalidated device data.  Last Pain:  Vitals:   08/15/21 0542  TempSrc: Oral  PainSc:       Patients Stated Pain Goal: 4 (63/87/56 4332)  Complications: No notable events documented.

## 2021-08-15 NOTE — Care Plan (Signed)
Ortho Bundle Case Management Note  Patient Details  Name: Sierra Johnson MRN: [id number] Date of Birth: 05-01-1951                  L TKA on 08-15-21 DCP: Home with husband DME: RW ordered through [facility] PT: EO on 08-18-21   DME Arranged:  [facility] DME Agency:  [facility]  HH Arranged:    [place] Agency:     Additional Comments: Please contact me with any questions of if this plan should need to change.  Marianne Sofia, RN,CCM [facility]  [phone] 08/15/2021, 9:38 AM

## 2021-08-15 NOTE — Op Note (Signed)
OPERATIVE REPORT-TOTAL KNEE ARTHROPLASTY   Pre-operative diagnosis- Osteoarthritis  Left knee(s)  Post-operative diagnosis- Osteoarthritis Left knee(s)  Procedure-  Left  Total Knee Arthroplasty  Surgeon- Dione Plover. Tishana Clinkenbeard, MD  Assistant- Theresa Duty, PA-C   Anesthesia-  GA combined with regional for post-op pain  EBL- 25 ml   Drains None  Tourniquet time-  Total Tourniquet Time Documented: Thigh (Left) - 43 minutes Total: Thigh (Left) - 43 minutes     Complications- None  Condition-PACU - hemodynamically stable.   Brief Clinical Note  Sierra Johnson is a 71 y.o. year old female with end stage OA of her left knee with progressively worsening pain and dysfunction. She has constant pain, with activity and at rest and significant functional deficits with difficulties even with ADLs. She has had extensive non-op management including analgesics, injections of cortisone and viscosupplements, and home exercise program, but remains in significant pain with significant dysfunction. Radiographs show bone on bone arthritis medial and patellofemoral. She presents now for left Total Knee Arthroplasty.     Procedure in detail---   The patient is brought into the operating room and positioned supine on the operating table. After successful administration of  GA combined with regional for post-op pain,   a tourniquet is placed high on the  Left thigh(s) and the lower extremity is prepped and draped in the usual sterile fashion. Time out is performed by the operating team and then the  Left lower extremity is wrapped in Esmarch, knee flexed and the tourniquet inflated to 300 mmHg.       A midline incision is made with a ten blade through the subcutaneous tissue to the level of the extensor mechanism. A fresh blade is used to make a medial parapatellar arthrotomy. Soft tissue over the proximal medial tibia is subperiosteally elevated to the joint line with a knife and into the  semimembranosus bursa with a Cobb elevator. Soft tissue over the proximal lateral tibia is elevated with attention being paid to avoiding the patellar tendon on the tibial tubercle. The patella is everted, knee flexed 90 degrees and the ACL and PCL are removed. Findings are bone on bone all 3 compartments with large global osteophytes        The drill is used to create a starting hole in the distal femur and the canal is thoroughly irrigated with sterile saline to remove the fatty contents. The 5 degree Left  valgus alignment guide is placed into the femoral canal and the distal femoral cutting block is pinned to remove 9 mm off the distal femur. Resection is made with an oscillating saw.      The tibia is subluxed forward and the menisci are removed. The extramedullary alignment guide is placed referencing proximally at the medial aspect of the tibial tubercle and distally along the second metatarsal axis and tibial crest. The block is pinned to remove 53mm off the more deficient medial  side. Resection is made with an oscillating saw. Size 4is the most appropriate size for the tibia and the proximal tibia is prepared with the modular drill and keel punch for that size.      The femoral sizing guide is placed and size 5 is most appropriate. Rotation is marked off the epicondylar axis and confirmed by creating a rectangular flexion gap at 90 degrees. The size 5 cutting block is pinned in this rotation and the anterior, posterior and chamfer cuts are made with the oscillating saw. The intercondylar block is then placed  and that cut is made.      Trial size 4 tibial component, trial size 5 narrow posterior stabilized femur and a 8  mm posterior stabilized rotating platform insert trial is placed. Full extension is achieved with excellent varus/valgus and anterior/posterior balance throughout full range of motion. The patella is everted and thickness measured to be 22  mm. Free hand resection is taken to 12 mm, a 32  template is placed, lug holes are drilled, trial patella is placed, and it tracks normally. Osteophytes are removed off the posterior femur with the trial in place. All trials are removed and the cut bone surfaces prepared with pulsatile lavage. Cement is mixed and once ready for implantation, the size 4 tibial implant, size  5 narrow posterior stabilized femoral component, and the size 32 patella are cemented in place and the patella is held with the clamp. The trial insert is placed and the knee held in full extension. The Exparel (20 ml mixed with 60 ml saline) is injected into the extensor mechanism, posterior capsule, medial and lateral gutters and subcutaneous tissues.  All extruded cement is removed and once the cement is hard the permanent 8 mm posterior stabilized rotating platform insert is placed into the tibial tray.      The wound is copiously irrigated with saline solution and the extensor mechanism closed with # 0 Stratofix suture. The tourniquet is released for a total tourniquet time of 43  minutes. Flexion against gravity is 140 degrees and the patella tracks normally. Subcutaneous tissue is closed with 2.0 vicryl and subcuticular with running 4.0 Monocryl. The incision is cleaned and dried and steri-strips and a bulky sterile dressing are applied. The limb is placed into a knee immobilizer and the patient is awakened and transported to recovery in stable condition.      Please note that a surgical assistant was a medical necessity for this procedure in order to perform it in a safe and expeditious manner. Surgical assistant was necessary to retract the ligaments and vital neurovascular structures to prevent injury to them and also necessary for proper positioning of the limb to allow for anatomic placement of the prosthesis.   Dione Plover Rodger Giangregorio, MD    08/15/2021, 8:26 AM

## 2021-08-15 NOTE — Anesthesia Procedure Notes (Signed)
Procedure Name: LMA Insertion Date/Time: 08/15/2021 7:17 AM Performed by: Eben Burow, CRNA Pre-anesthesia Checklist: Patient identified, Emergency Drugs available, Suction available, Patient being monitored and Timeout performed Patient Re-evaluated:Patient Re-evaluated prior to induction Oxygen Delivery Method: Circle system utilized Preoxygenation: Pre-oxygenation with 100% oxygen Induction Type: IV induction Ventilation: Mask ventilation without difficulty LMA: LMA inserted LMA Size: 4.0 Number of attempts: 1 Tube secured with: Tape Dental Injury: Teeth and Oropharynx as per pre-operative assessment

## 2021-08-15 NOTE — Addendum Note (Signed)
Addendum  created 08/15/21 1506 by Eben Burow, CRNA   Charge Capture section accepted

## 2021-08-15 NOTE — Evaluation (Signed)
Physical Therapy Evaluation Patient Details Name: Sierra Johnson MRN: [id number] DOB: 1951-03-23 Today's Date: 08/15/2021  History of Present Illness  Pt is a 71 year old female s/p Lt TKA on 08/15/21.  PMHx R TKA 01/27/19, PNA, PE 2019, DM, obesity, OSA, HTN  Clinical Impression  Pt is s/p TKA resulting in the deficits listed below (see PT Problem List). Pt will benefit from skilled PT to increase their independence and safety with mobility to allow discharge to the venue listed below.  Pt assisted with ambulating in hallway POD #0.  Pt plans to return home with spouse upon d/c and f/u with OPPT.      Recommendations for follow up therapy are one component of a multi-disciplinary discharge planning process, led by the attending physician.  Recommendations may be updated based on patient status, additional functional criteria and insurance authorization.  Follow Up Recommendations Follow physician's recommendations for discharge plan and follow up therapies (plans for OPPT)    Assistance Recommended at Discharge PRN  Patient can return home with the following  A little help with walking and/or transfers;A little help with bathing/dressing/bathroom    Equipment Recommendations None recommended by PT  Recommendations for Other Services       Functional Status Assessment Patient has had a recent decline in their functional status and demonstrates the ability to make significant improvements in function in a reasonable and predictable amount of time.     Precautions / Restrictions Precautions Precautions: Fall;Knee Required Braces or Orthoses: Knee Immobilizer - Left Restrictions Weight Bearing Restrictions: No Other Position/Activity Restrictions: WBAT      Mobility  Bed Mobility Overal bed mobility: Needs Assistance Bed Mobility: Supine to Sit     Supine to sit: Min guard, HOB elevated     General bed mobility comments: verbal cues for sequence    Transfers Overall  transfer level: Needs assistance Equipment used: Rolling walker (2 wheels) Transfers: Sit to/from Stand Sit to Stand: Min guard           General transfer comment: verbal cues for UE and LE positioning    Ambulation/Gait Ambulation/Gait assistance: Min guard Gait Distance (Feet): 60 Feet Assistive device: Rolling walker (2 wheels) Gait Pattern/deviations: Step-to pattern, Decreased stance time - left, Antalgic       General Gait Details: verbal cues for sequence, RW positioning, step length, distance to tolerance  Stairs            Wheelchair Mobility    Modified Rankin (Stroke Patients Only)       Balance                                             Pertinent Vitals/Pain Pain Assessment Pain Assessment: 0-10 Pain Score: 5  Pain Location: left knee Pain Descriptors / Indicators: Sore, Aching Pain Intervention(s): Monitored during session, Repositioned, Patient requesting pain meds-RN notified    Home Living Family/patient expects to be discharged to:: Private residence Living Arrangements: Spouse/significant other Available Help at Discharge: Family Type of Home: House Home Access: Stairs to enter Entrance Stairs-Rails: None Entrance Stairs-Number of Steps: 2   Home Layout: One level Home Equipment: Conservation officer, nature (2 wheels)      Prior Function Prior Level of Function : Independent/Modified Independent                     Hand Dominance  Extremity/Trunk Assessment        Lower Extremity Assessment Lower Extremity Assessment: LLE deficits/detail LLE Deficits / Details: able to perform SLR and ankle pumps, observed at least 30* AAROM knee flexion at edge of bed       Communication   Communication: No difficulties  Cognition Arousal/Alertness: Awake/alert Behavior During Therapy: WFL for tasks assessed/performed Overall Cognitive Status: Within Functional Limits for tasks assessed                                           General Comments      Exercises     Assessment/Plan    PT Assessment Patient needs continued PT services  PT Problem List Decreased mobility;Decreased activity tolerance;Decreased range of motion;Decreased strength;Pain;Decreased knowledge of precautions;Decreased knowledge of use of DME       PT Treatment Interventions Stair training;Gait training;DME instruction;Therapeutic exercise;Balance training;Therapeutic activities;Functional mobility training;Patient/family education    PT Goals (Current goals can be found in the Care Plan section)  Acute Rehab PT Goals PT Goal Formulation: With patient Time For Goal Achievement: 08/22/21 Potential to Achieve Goals: Good    Frequency 7X/week     Co-evaluation               AM-PAC PT "6 Clicks" Mobility  Outcome Measure Help needed turning from your back to your side while in a flat bed without using bedrails?: A Little Help needed moving from lying on your back to sitting on the side of a flat bed without using bedrails?: A Little Help needed moving to and from a bed to a chair (including a wheelchair)?: A Little Help needed standing up from a chair using your arms (e.g., wheelchair or bedside chair)?: A Little Help needed to walk in hospital room?: A Little Help needed climbing 3-5 steps with a railing? : A Lot 6 Click Score: 17    End of Session Equipment Utilized During Treatment: Gait belt Activity Tolerance: Patient tolerated treatment well Patient left: in chair;with call bell/phone within reach;with family/visitor present;with chair alarm set Nurse Communication: Mobility status;Patient requests pain meds PT Visit Diagnosis: Difficulty in walking, not elsewhere classified (R26.2)    Time: 7902-4097 PT Time Calculation (min) (ACUTE ONLY): 14 min   Charges:   PT Evaluation $PT Eval Low Complexity: 1 Low        Kati PT, DPT Acute Rehabilitation Services Pager:  [phone] Office: [place] 08/15/2021, 3:28 PM

## 2021-08-15 NOTE — Progress Notes (Signed)
Orthopedic Tech Progress Note Patient Details:  Sierra Johnson May 15, 1951 [id number]  CPM Left Knee CPM Left Knee: On Left Knee Flexion (Degrees): 40 Left Knee Extension (Degrees): 10  Post Interventions Patient Tolerated: Well Instructions Provided: Care of device, Adjustment of device  [name] 08/15/2021, 9:01 AM

## 2021-08-15 NOTE — Anesthesia Procedure Notes (Signed)
Procedure Name: Intubation Date/Time: 08/15/2021 8:13 AM Performed by: Eben Burow, CRNA Pre-anesthesia Checklist: Patient identified, Emergency Drugs available, Suction available, Patient being monitored and Timeout performed Patient Re-evaluated:Patient Re-evaluated prior to induction Oxygen Delivery Method: Circle system utilized Preoxygenation: Pre-oxygenation with 100% oxygen Induction Type: IV induction Ventilation: Mask ventilation without difficulty Laryngoscope Size: Mac and 4 Grade View: Grade I Tube type: Oral Tube size: 7.0 mm Number of attempts: 1 Airway Equipment and Method: Stylet Placement Confirmation: ETT inserted through vocal cords under direct vision, positive ETCO2 and breath sounds checked- equal and bilateral Secured at: 21 cm Tube secured with: Tape Dental Injury: Teeth and Oropharynx as per pre-operative assessment  Comments: Pt has chronic cough with secretions since 2019 PE.

## 2021-08-15 NOTE — Anesthesia Postprocedure Evaluation (Signed)
Anesthesia Post Note  Patient: Sierra Johnson  Procedure(s) Performed: TOTAL KNEE ARTHROPLASTY (Left: Knee)     Patient location during evaluation: PACU Anesthesia Type: Regional and General Level of consciousness: awake and alert and oriented Pain management: pain level controlled Vital Signs Assessment: post-procedure vital signs reviewed and stable Respiratory status: spontaneous breathing, nonlabored ventilation and respiratory function stable Cardiovascular status: blood pressure returned to baseline and stable Postop Assessment: no apparent nausea or vomiting Anesthetic complications: no   No notable events documented.  Last Vitals:  Vitals:   08/15/21 1000 08/15/21 1016  BP: 107/72 115/73  Pulse: 75 83  Resp: 12 14  Temp: (!) 36.4 C (!) 36.3 C  SpO2: 100% 100%    Last Pain:  Vitals:   08/15/21 1016  TempSrc: Oral  PainSc:                  Pervis Hocking

## 2021-08-15 NOTE — Anesthesia Procedure Notes (Signed)
Anesthesia Regional Block: Adductor canal block   Pre-Anesthetic Checklist: , timeout performed,  Correct Patient, Correct Site, Correct Laterality,  Correct Procedure, Correct Position, site marked,  Risks and benefits discussed,  Surgical consent,  Pre-op evaluation,  At surgeon's request and post-op pain management  Laterality: Left  Prep: Maximum Sterile Barrier Precautions used, chloraprep       Needles:  Injection technique: Single-shot  Needle Type: Echogenic Stimulator Needle     Needle Length: 9cm  Needle Gauge: 22     Additional Needles:   Procedures:,,,, ultrasound used (permanent image in chart),,    Narrative:  Start time: 08/15/2021 7:00 AM End time: 08/15/2021 7:05 AM Injection made incrementally with aspirations every 5 mL.  Performed by: Personally  Anesthesiologist: Pervis Hocking, DO  Additional Notes: Monitors applied. No increased pain on injection. No increased resistance to injection. Injection made in 5cc increments. Good needle visualization. Patient tolerated procedure well.

## 2021-08-15 NOTE — Discharge Instructions (Signed)
 Sierra Aluisio, MD Total Joint Specialist [facility] [place] [address] [place], [place] [address] [phone]  TOTAL KNEE REPLACEMENT POSTOPERATIVE DIRECTIONS    Knee Rehabilitation, Guidelines Following Surgery  Results after knee surgery are often greatly improved when you follow the exercise, range of motion and muscle strengthening exercises prescribed by your doctor. Safety measures are also important to protect the knee from further injury. If any of these exercises cause you to have increased pain or swelling in your knee joint, decrease the amount until you are comfortable again and slowly increase them. If you have problems or questions, call your caregiver or physical therapist for advice.   HOME CARE INSTRUCTIONS  Remove items at home which could result in a fall. This includes throw rugs or furniture in walking pathways.  ICE to the affected knee as much as tolerated. Icing helps control swelling. If the swelling is well controlled you will be more comfortable and rehab easier. Continue to use ice on the knee for pain and swelling from surgery. You may notice swelling that will progress down to the foot and ankle. This is normal after surgery. Elevate the leg when you are not up walking on it.    Continue to use the breathing machine which will help keep your temperature down. It is common for your temperature to cycle up and down following surgery, especially at night when you are not up moving around and exerting yourself. The breathing machine keeps your lungs expanded and your temperature down. Do not place pillow under the operative knee, focus on keeping the knee straight while resting  DIET You may resume your previous home diet once you are discharged from the hospital.  DRESSING / WOUND CARE / SHOWERING Keep your bulky bandage on for 2 days. On the third post-operative day you may remove the Ace bandage and gauze. There is a waterproof  adhesive bandage on your skin which will stay in place until your first follow-up appointment. Once you remove this you will not need to place another bandage You may begin showering 3 days following surgery, but do not submerge the incision under water.  ACTIVITY For the first 5 days, the key is rest and control of pain and swelling Do your home exercises twice a day starting on post-operative day 3. On the days you go to physical therapy, just do the home exercises once that day. You should rest, ice and elevate the leg for 50 minutes out of every hour. Get up and walk/stretch for 10 minutes per hour. After 5 days you can increase your activity slowly as tolerated. Walk with your walker as instructed. Use the walker until you are comfortable transitioning to a cane. Walk with the cane in the opposite hand of the operative leg. You may discontinue the cane once you are comfortable and walking steadily. Avoid periods of inactivity such as sitting longer than an hour when not asleep. This helps prevent blood clots.  You may discontinue the knee immobilizer once you are able to perform a straight leg raise while lying down. You may resume a sexual relationship in one month or when given the OK by your doctor.  You may return to work once you are cleared by your doctor.  Do not drive a car for 6 weeks or until released by your surgeon.  Do not drive while taking narcotics.  TED HOSE STOCKINGS Wear the elastic stockings on both legs for three weeks following surgery during the   day. You may remove them at night for sleeping.  WEIGHT BEARING Weight bearing as tolerated with assist device (walker, cane, etc) as directed, use it as long as suggested by your surgeon or therapist, typically at least 4-6 weeks.  POSTOPERATIVE CONSTIPATION PROTOCOL Constipation - defined medically as fewer than three stools per week and severe constipation as less than one stool per week.  One of the most common issues  patients have following surgery is constipation.  Even if you have a regular bowel pattern at home, your normal regimen is likely to be disrupted due to multiple reasons following surgery.  Combination of anesthesia, postoperative narcotics, change in appetite and fluid intake all can affect your bowels.  In order to avoid complications following surgery, here are some recommendations in order to help you during your recovery period.  Colace (docusate) - Pick up an over-the-counter form of Colace or another stool softener and take twice a day as long as you are requiring postoperative pain medications.  Take with a full glass of water daily.  If you experience loose stools or diarrhea, hold the colace until you stool forms back up. If your symptoms do not get better within 1 week or if they get worse, check with your doctor. Dulcolax (bisacodyl) - Pick up over-the-counter and take as directed by the product packaging as needed to assist with the movement of your bowels.  Take with a full glass of water.  Use this product as needed if not relieved by Colace only.  MiraLax (polyethylene glycol) - Pick up over-the-counter to have on hand. MiraLax is a solution that will increase the amount of water in your bowels to assist with bowel movements.  Take as directed and can mix with a glass of water, juice, soda, coffee, or tea. Take if you go more than two days without a movement. Do not use MiraLax more than once per day. Call your doctor if you are still constipated or irregular after using this medication for 7 days in a row.  If you continue to have problems with postoperative constipation, please contact the office for further assistance and recommendations.  If you experience "the worst abdominal pain ever" or develop nausea or vomiting, please contact the office immediatly for further recommendations for treatment.  ITCHING If you experience itching with your medications, try taking only a single pain  pill, or even half a pain pill at a time.  You can also use Benadryl over the counter for itching or also to help with sleep.   MEDICATIONS See your medication summary on the "After Visit Summary" that the nursing staff will review with you prior to discharge.  You may have some home medications which will be placed on hold until you complete the course of blood thinner medication.  It is important for you to complete the blood thinner medication as prescribed by your surgeon.  Continue your approved medications as instructed at time of discharge.  PRECAUTIONS If you experience chest pain or shortness of breath - call 911 immediately for transfer to the hospital emergency department.  If you develop a fever greater that 101 F, purulent drainage from wound, increased redness or drainage from wound, foul odor from the wound/dressing, or calf pain - CONTACT YOUR SURGEON.                                                     FOLLOW-UP APPOINTMENTS Make sure you keep all of your appointments after your operation with your surgeon and caregivers. You should call the office at the above phone number and make an appointment for approximately two weeks after the date of your surgery or on the date instructed by your surgeon outlined in the "After Visit Summary".  RANGE OF MOTION AND STRENGTHENING EXERCISES  Rehabilitation of the knee is important following a knee injury or an operation. After just a few days of immobilization, the muscles of the thigh which control the knee become weakened and shrink (atrophy). Knee exercises are designed to build up the tone and strength of the thigh muscles and to improve knee motion. Often times heat used for twenty to thirty minutes before working out will loosen up your tissues and help with improving the range of motion but do not use heat for the first two weeks following surgery. These exercises can be done on a training (exercise) mat, on the floor, on a table or on a bed.  Use what ever works the best and is most comfortable for you Knee exercises include:  Leg Lifts - While your knee is still immobilized in a splint or cast, you can do straight leg raises. Lift the leg to 60 degrees, hold for 3 sec, and slowly lower the leg. Repeat 10-20 times 2-3 times daily. Perform this exercise against resistance later as your knee gets better.  Quad and Hamstring Sets - Tighten up the muscle on the front of the thigh (Quad) and hold for 5-10 sec. Repeat this 10-20 times hourly. Hamstring sets are done by pushing the foot backward against an object and holding for 5-10 sec. Repeat as with quad sets.  Leg Slides: Lying on your back, slowly slide your foot toward your buttocks, bending your knee up off the floor (only go as far as is comfortable). Then slowly slide your foot back down until your leg is flat on the floor again. Angel Wings: Lying on your back spread your legs to the side as far apart as you can without causing discomfort.  A rehabilitation program following serious knee injuries can speed recovery and prevent re-injury in the future due to weakened muscles. Contact your doctor or a physical therapist for more information on knee rehabilitation.   POST-OPERATIVE OPIOID TAPER INSTRUCTIONS: It is important to wean off of your opioid medication as soon as possible. If you do not need pain medication after your surgery it is ok to stop day one. Opioids include: Codeine, Hydrocodone(Norco, Vicodin), Oxycodone(Percocet, oxycontin) and hydromorphone amongst others.  Long term and even short term use of opiods can cause: Increased pain response Dependence Constipation Depression Respiratory depression And more.  Withdrawal symptoms can include Flu like symptoms Nausea, vomiting And more Techniques to manage these symptoms Hydrate well Eat regular healthy meals Stay active Use relaxation techniques(deep breathing, meditating, yoga) Do Not substitute Alcohol to help  with tapering If you have been on opioids for less than two weeks and do not have pain than it is ok to stop all together.  Plan to wean off of opioids This plan should start within one week post op of your joint replacement. Maintain the same interval or time between taking each dose and first decrease the dose.  Cut the total daily intake of opioids by one tablet each day Next start to increase the time between doses. The last dose that should be eliminated is the evening dose.   IF YOU ARE TRANSFERRED TO   A SKILLED REHAB FACILITY If the patient is transferred to a skilled rehab facility following release from the hospital, a list of the current medications will be sent to the facility for the patient to continue.  When discharged from the skilled rehab facility, please have the facility set up the patient's Home Health Physical Therapy prior to being released. Also, the skilled facility will be responsible for providing the patient with their medications at time of release from the facility to include their pain medication, the muscle relaxants, and their blood thinner medication. If the patient is still at the rehab facility at time of the two week follow up appointment, the skilled rehab facility will also need to assist the patient in arranging follow up appointment in our office and any transportation needs.  MAKE SURE YOU:  Understand these instructions.  Get help right away if you are not doing well or get worse.   DENTAL ANTIBIOTICS:  In most cases prophylactic antibiotics for Dental procdeures after total joint surgery are not necessary.  Exceptions are as follows:  1. History of prior total joint infection  2. Severely immunocompromised (Organ Transplant, cancer chemotherapy, Rheumatoid biologic meds such as Humera)  3. Poorly controlled diabetes (A1C &gt; 8.0, blood glucose over 200)  If you have one of these conditions, contact your surgeon for an antibiotic prescription,  prior to your dental procedure.    Pick up stool softner and laxative for home use following surgery while on pain medications. Do not submerge incision under water. Please use good hand washing techniques while changing dressing each day. May shower starting three days after surgery. Please use a clean towel to pat the incision dry following showers. Continue to use ice for pain and swelling after surgery. Do not use any lotions or creams on the incision until instructed by your surgeon.  

## 2021-08-16 ENCOUNTER — Encounter (HOSPITAL_COMMUNITY): Payer: Self-pay | Admitting: Orthopedic Surgery

## 2021-08-16 DIAGNOSIS — Z96651 Presence of right artificial knee joint: Secondary | ICD-10-CM | POA: Diagnosis not present

## 2021-08-16 DIAGNOSIS — I129 Hypertensive chronic kidney disease with stage 1 through stage 4 chronic kidney disease, or unspecified chronic kidney disease: Secondary | ICD-10-CM | POA: Diagnosis not present

## 2021-08-16 DIAGNOSIS — E1122 Type 2 diabetes mellitus with diabetic chronic kidney disease: Secondary | ICD-10-CM | POA: Diagnosis not present

## 2021-08-16 DIAGNOSIS — M1712 Unilateral primary osteoarthritis, left knee: Secondary | ICD-10-CM | POA: Diagnosis not present

## 2021-08-16 DIAGNOSIS — N183 Chronic kidney disease, stage 3 unspecified: Secondary | ICD-10-CM | POA: Diagnosis not present

## 2021-08-16 DIAGNOSIS — Z7901 Long term (current) use of anticoagulants: Secondary | ICD-10-CM | POA: Diagnosis not present

## 2021-08-16 LAB — GLUCOSE, CAPILLARY: Glucose-Capillary: 126 mg/dL — ABNORMAL HIGH (ref 70–99)

## 2021-08-16 MED ORDER — TRANEXAMIC ACID 1000 MG/10ML IV SOLN: 2000.0000 mg | Freq: Once | INTRAVENOUS | Status: DC

## 2021-08-16 MED ORDER — GABAPENTIN 300 MG PO CAPS: ORAL_CAPSULE | ORAL | 0 refills | Status: DC

## 2021-08-16 MED ORDER — OXYCODONE HCL 5 MG PO TABS: 5.0000 mg | ORAL_TABLET | Freq: Four times a day (QID) | ORAL | 0 refills | Status: DC | PRN

## 2021-08-16 MED ORDER — TRAMADOL HCL 50 MG PO TABS
50.0000 mg | ORAL_TABLET | Freq: Four times a day (QID) | ORAL | 0 refills | Status: DC | PRN
Start: 2021-08-16 — End: 2022-11-30

## 2021-08-16 MED ORDER — METHOCARBAMOL 500 MG PO TABS: 500.0000 mg | ORAL_TABLET | Freq: Four times a day (QID) | ORAL | 0 refills | Status: DC | PRN

## 2021-08-16 NOTE — TOC Transition Note (Signed)
Transition of Care [facility]) - CM/SW Discharge Note   Patient Details  Name: Sierra Johnson MRN: [id number] Date of Birth: May 14, 1951  [facility] CM/SW Contact:  Lennart Pall, LCSW Phone Number: 08/16/2021, 11:18 AM   Clinical Narrative:    Met briefly with pt and confirming she already has needed DME at home (order cancelled with [employer]).  Plan for OPPT at [facility].  No TOC needs.   Final next level of care: OP Rehab     Patient Goals and CMS Choice Patient states their goals for this hospitalization and ongoing recovery are:: return home      Discharge Placement                       Discharge Plan and Services                DME Arranged: N/A DME Agency: NA                  Social Determinants of Health (SDOH) Interventions     Readmission Risk Interventions No flowsheet data found.

## 2021-08-16 NOTE — Progress Notes (Signed)
° °  Subjective: 1 Day Post-Op Procedure(s) (LRB): TOTAL KNEE ARTHROPLASTY (Left) Patient reports pain as mild.   Patient seen in rounds by Dr. Wynelle Link. Patient is well, and has had no acute complaints or problems No issues overnight. Denies chest pain, SOB, or calf pain. Voiding without difficulty. We will continue therapy today, ambulated 68' yesterday.   Objective: Vital signs in last 24 hours: Temp:  [97.4 F (36.3 C)-98 F (36.7 C)] 97.8 F (36.6 C) (02/28 0617) Pulse Rate:  [60-94] 60 (02/28 0617) Resp:  [12-20] 17 (02/28 0617) BP: (98-132)/(59-83) 114/80 (02/28 0617) SpO2:  [95 %-100 %] 100 % (02/28 0617)  Intake/Output from previous day:  Intake/Output Summary (Last 24 hours) at 08/16/2021 0748 Last data filed at 08/16/2021 1287 Gross per 24 hour  Intake 2580 ml  Output 1520 ml  Net 1060 ml     Intake/Output this shift: Total I/O In: -  Out: 320 [Urine:320]  Labs: No results for input(s): HGB in the last 72 hours. No results for input(s): WBC, RBC, HCT, PLT in the last 72 hours. No results for input(s): NA, K, CL, CO2, BUN, CREATININE, GLUCOSE, CALCIUM in the last 72 hours. No results for input(s): LABPT, INR in the last 72 hours.  Exam: General - Patient is Alert and Oriented Extremity - Neurologically intact Neurovascular intact Sensation intact distally Dorsiflexion/Plantar flexion intact Dressing - dressing C/D/I Motor Function - intact, moving foot and toes well on exam.   Past Medical History:  Diagnosis Date   ANEMIA-NOS 04/24/2007   Arthritis    Blood clot in vein    Cough    DIABETES MELLITUS, TYPE II 04/24/2007   Elevated glucose 2011   HYPERLIPIDEMIA 07/17/2007   mild   HYPERTENSION 04/24/2007   Knee pain    Menopause 1998   Dr. Gaetano Net   OBSTRUCTIVE SLEEP APNEA 09/30/2009   on CPAP 2011   Pneumonia    chronic Brochitis and use inhaler   VITAMIN B12 DEFICIENCY 04/24/2007    Assessment/Plan: 1 Day Post-Op Procedure(s) (LRB): TOTAL KNEE  ARTHROPLASTY (Left) Principal Problem:   Primary osteoarthritis of left knee  Estimated body mass index is 39.16 kg/m as calculated from the following:   Height as of this encounter: 5' (1.524 m).   Weight as of this encounter: 90.9 kg. Advance diet Up with therapy D/C IV fluids   Patient's anticipated LOS is less than 2 midnights, meeting these requirements: - Lives within 1 hour of care - Has a competent adult at home to recover with post-op recover - NO history of  - Chronic pain requiring opiods  - Coronary Artery Disease  - Heart failure  - Heart attack  - Stroke  - Cardiac arrhythmia  - Respiratory Failure/COPD  - Renal failure  - Anemia  - Advanced Liver disease   DVT Prophylaxis -  Eliquis Weight bearing as tolerated. Continue therapy.  Plan is to go Home after hospital stay. Plan for discharge later today if progresses with therapy and meeting goals. Scheduled for OPPT at [facility]. Follow-up in the office in 2 weeks.  The PDMP database was reviewed today prior to any opioid medications being prescribed to this patient.  Theresa Duty, PA-C Orthopedic Surgery [phone] 08/16/2021, 7:48 AM

## 2021-08-16 NOTE — Plan of Care (Signed)
Pt ready to DC home with family. 

## 2021-08-16 NOTE — Progress Notes (Signed)
Physical Therapy Treatment Patient Details Name: Sierra Johnson MRN: [id number] DOB: 1951-05-21 Today's Date: 08/16/2021   History of Present Illness Pt is a 71 year old female s/p Lt TKA on 08/15/21.  PMHx R TKA 01/27/19, PNA, PE 2019, DM, obesity, OSA, HTN    PT Comments    Pt ambulated in hallway, practiced safe stair technique, and performed LE exercises.  Pt provided with HEP and stair handouts.  Pt had no further questions and feels ready for d/c home today.    Recommendations for follow up therapy are one component of a multi-disciplinary discharge planning process, led by the attending physician.  Recommendations may be updated based on patient status, additional functional criteria and insurance authorization.  Follow Up Recommendations  Follow physician's recommendations for discharge plan and follow up therapies     Assistance Recommended at Discharge PRN  Patient can return home with the following A little help with walking and/or transfers;A little help with bathing/dressing/bathroom;Help with stairs or ramp for entrance   Equipment Recommendations  None recommended by PT    Recommendations for Other Services       Precautions / Restrictions Precautions Precautions: Fall;Knee Precaution Comments: able to perform SLR Required Braces or Orthoses: Knee Immobilizer - Left Restrictions Other Position/Activity Restrictions: WBAT     Mobility  Bed Mobility Overal bed mobility: Needs Assistance Bed Mobility: Supine to Sit     Supine to sit: Min guard, HOB elevated     General bed mobility comments: verbal cues for sequence    Transfers Overall transfer level: Needs assistance Equipment used: Rolling walker (2 wheels) Transfers: Sit to/from Stand Sit to Stand: Min guard           General transfer comment: verbal cues for UE and LE positioning    Ambulation/Gait Ambulation/Gait assistance: Min guard Gait Distance (Feet): 300 Feet Assistive device:  Rolling walker (2 wheels) Gait Pattern/deviations: Step-to pattern, Decreased stance time - left, Antalgic       General Gait Details: verbal cues for sequence, RW positioning, step length   Stairs Stairs: Yes Stairs assistance: Min guard Stair Management: Step to pattern, Backwards, With walker Number of Stairs: 3 General stair comments: verbal cues for safety, sequence, RW positioning, performed once and pt declined needing to practice again, reports understanding; provided stair handout   Wheelchair Mobility    Modified Rankin (Stroke Patients Only)       Balance                                            Cognition Arousal/Alertness: Awake/alert Behavior During Therapy: WFL for tasks assessed/performed Overall Cognitive Status: Within Functional Limits for tasks assessed                                          Exercises Total Joint Exercises Ankle Circles/Pumps: AROM, Both, 10 reps Quad Sets: AROM, Both, 10 reps Heel Slides: AAROM, Left, 10 reps Hip ABduction/ADduction: AROM, Left, 10 reps Straight Leg Raises: AROM, Left, 10 reps    General Comments        Pertinent Vitals/Pain Pain Assessment Pain Assessment: 0-10 Pain Score: 6  Pain Location: left knee Pain Descriptors / Indicators: Sore, Aching Pain Intervention(s): Monitored during session, Repositioned, Premedicated before session    Home  Living                          Prior Function            PT Goals (current goals can now be found in the care plan section) Progress towards PT goals: Progressing toward goals    Frequency    7X/week      PT Plan Current plan remains appropriate    Co-evaluation              AM-PAC PT "6 Clicks" Mobility   Outcome Measure  Help needed turning from your back to your side while in a flat bed without using bedrails?: A Little Help needed moving from lying on your back to sitting on the side of a  flat bed without using bedrails?: A Little Help needed moving to and from a bed to a chair (including a wheelchair)?: A Little Help needed standing up from a chair using your arms (e.g., wheelchair or bedside chair)?: A Little Help needed to walk in hospital room?: A Little Help needed climbing 3-5 steps with a railing? : A Little 6 Click Score: 18    End of Session Equipment Utilized During Treatment: Gait belt Activity Tolerance: Patient tolerated treatment well Patient left: in chair;with call bell/phone within reach;with chair alarm set   PT Visit Diagnosis: Difficulty in walking, not elsewhere classified (R26.2)     Time: 7867-5449 PT Time Calculation (min) (ACUTE ONLY): 19 min  Charges:  $Gait Training: 8-22 mins                    Arlyce Dice, DPT Acute Rehabilitation Services Pager: [phone] Office: [place] 08/16/2021, 4:40 PM

## 2021-08-18 DIAGNOSIS — M25562 Pain in left knee: Secondary | ICD-10-CM | POA: Diagnosis not present

## 2021-08-18 NOTE — Discharge Summary (Signed)
Patient ID: Sierra Johnson MRN: [id number] DOB/AGE: [birth year] 71 y.o.  Admit date: 08/15/2021 Discharge date: 08/16/2021  Admission Diagnoses:  Principal Problem:   Primary osteoarthritis of left knee   Discharge Diagnoses:  Same  Past Medical History:  Diagnosis Date   ANEMIA-NOS 04/24/2007   Arthritis    Blood clot in vein    Cough    DIABETES MELLITUS, TYPE II 04/24/2007   Elevated glucose 2011   HYPERLIPIDEMIA 07/17/2007   mild   HYPERTENSION 04/24/2007   Knee pain    Menopause 1998   Dr. Gaetano Net   OBSTRUCTIVE SLEEP APNEA 09/30/2009   on CPAP 2011   Pneumonia    chronic Brochitis and use inhaler   VITAMIN B12 DEFICIENCY 04/24/2007    Surgeries: Procedure(s): TOTAL KNEE ARTHROPLASTY on 08/15/2021   Consultants:   Discharged Condition: Improved  Hospital Course: Sierra Johnson is an 71 y.o. female who was admitted 08/15/2021 for operative treatment ofPrimary osteoarthritis of left knee. Patient has severe unremitting pain that affects sleep, daily activities, and work/hobbies. After pre-op clearance the patient was taken to the operating room on 08/15/2021 and underwent  Procedure(s): TOTAL KNEE ARTHROPLASTY.    Patient was given perioperative antibiotics:  Anti-infectives (From admission, onward)    Start     Dose/Rate Route Frequency Ordered Stop   08/15/21 1230  ceFAZolin (ANCEF) IVPB 2g/100 mL premix        2 g 200 mL/hr over 30 Minutes Intravenous Every 6 hours 08/15/21 1020 08/16/21 0800   08/15/21 0600  ceFAZolin (ANCEF) IVPB 2g/100 mL premix        2 g 200 mL/hr over 30 Minutes Intravenous On call to O.R. 08/15/21 4270 08/15/21 0732        Patient was given sequential compression devices, early ambulation, and chemoprophylaxis to prevent DVT.  Patient benefited maximally from hospital stay and there were no complications.    Recent vital signs: No data found.   Recent laboratory studies: No results for input(s): WBC, HGB, HCT, PLT, NA, K, CL, CO2,  BUN, CREATININE, GLUCOSE, INR, CALCIUM in the last 72 hours.  Invalid input(s): PT, 2   Discharge Medications:   Allergies as of 08/16/2021       Reactions   Benazepril Swelling, Other (See Comments)   Lip swelling   Amlodipine Swelling, Other (See Comments)   LE swelling on 10 mg   Tetracycline Nausea And Vomiting        Medication List     STOP taking these medications    azithromycin 250 MG tablet Commonly known as: Zithromax Z-Pak   loratadine 10 MG tablet Commonly known as: CLARITIN   Rybelsus 3 MG Tabs Generic drug: Semaglutide       TAKE these medications    Accu-Chek FastClix Lancets Misc USE TO CHECK BLOOD SUGARS TWICE A DAY   Accu-Chek Guide test strip Generic drug: glucose blood UUSE TO CHECK BLOOD SUGARS TWICE A DAY   Accu-Chek Guide w/Device Kit 1 each by Does not apply route 2 (two) times daily. DX: E11.9   apixaban 2.5 MG Tabs tablet Commonly known as: Eliquis Take 1 tablet (2.5 mg total) by mouth 2 (two) times daily.   chlorpheniramine 4 MG tablet Commonly known as: CHLOR-TRIMETON Take 4 mg by mouth 3 (three) times daily.   gabapentin 300 MG capsule Commonly known as: NEURONTIN Take a 300 mg capsule three times a day for two weeks following surgery.Then take a 300 mg capsule two times a day for  two weeks. Then take a 300 mg capsule once a day for two weeks. Then discontinue.   Melatonin 10 MG Caps Take 10 mg by mouth at bedtime as needed (sleep).   metFORMIN 500 MG tablet Commonly known as: GLUCOPHAGE TAKE 1 TABLET 2 TIMES DAILY WITH A MEAL. ANNUAL APPT IS DUE W/LABS MUST SEE PROVIDER REFILLS   methocarbamol 500 MG tablet Commonly known as: ROBAXIN Take 1 tablet (500 mg total) by mouth every 6 (six) hours as needed for muscle spasms.   olmesartan 20 MG tablet Commonly known as: BENICAR TAKE 1 TABLET (20 MG TOTAL) BY MOUTH DAILY. FOLLOW-UP APPT W/ LABS DUE IN JANUARY MUST SEE PROVIDER FOR FUTURE REFILLS   ondansetron 4 MG  tablet Commonly known as: ZOFRAN Take 4 mg by mouth every 6 (six) hours as needed for nausea/vomiting.   oxyCODONE 5 MG immediate release tablet Commonly known as: Oxy IR/ROXICODONE Take 1-2 tablets (5-10 mg total) by mouth every 6 (six) hours as needed for severe pain.   pravastatin 20 MG tablet Commonly known as: PRAVACHOL TAKE 1 TABLET BY MOUTH EVERY DAY   traMADol 50 MG tablet Commonly known as: ULTRAM Take 1-2 tablets (50-100 mg total) by mouth every 6 (six) hours as needed for moderate pain.               Discharge Care Instructions  (From admission, onward)           Start     Ordered   08/16/21 0000  Weight bearing as tolerated        08/16/21 0752   08/16/21 0000  Change dressing       Comments: You may remove the bulky bandage (ACE wrap and gauze) two days after surgery. You will have an adhesive waterproof bandage underneath. Leave this in place until your first follow-up appointment.   08/16/21 0752            Diagnostic Studies: No results found.  Disposition: Discharge disposition: 01-Home or Self Care       Discharge Instructions     Call MD / Call 911   Complete by: As directed    If you experience chest pain or shortness of breath, CALL 911 and be transported to the hospital emergency room.  If you develope a fever above 101 F, pus (white drainage) or increased drainage or redness at the wound, or calf pain, call your surgeon's office.   Change dressing   Complete by: As directed    You may remove the bulky bandage (ACE wrap and gauze) two days after surgery. You will have an adhesive waterproof bandage underneath. Leave this in place until your first follow-up appointment.   Constipation Prevention   Complete by: As directed    Drink plenty of fluids.  Prune juice may be helpful.  You may use a stool softener, such as Colace (over the counter) 100 mg twice a day.  Use MiraLax (over the counter) for constipation as needed.   Diet - low  sodium heart healthy   Complete by: As directed    Do not put a pillow under the knee. Place it under the heel.   Complete by: As directed    Driving restrictions   Complete by: As directed    No driving for two weeks   Post-operative opioid taper instructions:   Complete by: As directed    POST-OPERATIVE OPIOID TAPER INSTRUCTIONS: It is important to wean off of your opioid medication as soon as  possible. If you do not need pain medication after your surgery it is ok to stop day one. Opioids include: Codeine, Hydrocodone(Norco, Vicodin), Oxycodone(Percocet, oxycontin) and hydromorphone amongst others.  Long term and even short term use of opiods can cause: Increased pain response Dependence Constipation Depression Respiratory depression And more.  Withdrawal symptoms can include Flu like symptoms Nausea, vomiting And more Techniques to manage these symptoms Hydrate well Eat regular healthy meals Stay active Use relaxation techniques(deep breathing, meditating, yoga) Do Not substitute Alcohol to help with tapering If you have been on opioids for less than two weeks and do not have pain than it is ok to stop all together.  Plan to wean off of opioids This plan should start within one week post op of your joint replacement. Maintain the same interval or time between taking each dose and first decrease the dose.  Cut the total daily intake of opioids by one tablet each day Next start to increase the time between doses. The last dose that should be eliminated is the evening dose.      TED hose   Complete by: As directed    Use stockings (TED hose) for three weeks on both leg(s).  You may remove them at night for sleeping.   Weight bearing as tolerated   Complete by: As directed         Follow-up Information     Aluisio, Pilar Plate, MD. Schedule an appointment as soon as possible for a visit in 2 week(s).   Specialty: Orthopedic Surgery Contact information: [address] [place] [address] [phone]         Rosilyn Mings.. Go on 08/18/2021.   Why: You are scheduled for physical therapy eval on Thursday March 2nd at 12:00pm. Contact information: [address] [place] [address] [phone]                  Signed: Theresa Duty 08/18/2021, 7:08 AM

## 2021-08-22 DIAGNOSIS — M25562 Pain in left knee: Secondary | ICD-10-CM | POA: Diagnosis not present

## 2021-08-24 DIAGNOSIS — M25562 Pain in left knee: Secondary | ICD-10-CM | POA: Diagnosis not present

## 2021-08-26 DIAGNOSIS — M25562 Pain in left knee: Secondary | ICD-10-CM | POA: Diagnosis not present

## 2021-08-29 DIAGNOSIS — M25562 Pain in left knee: Secondary | ICD-10-CM | POA: Diagnosis not present

## 2021-08-31 DIAGNOSIS — M25562 Pain in left knee: Secondary | ICD-10-CM | POA: Diagnosis not present

## 2021-09-02 DIAGNOSIS — M25562 Pain in left knee: Secondary | ICD-10-CM | POA: Diagnosis not present

## 2021-09-07 DIAGNOSIS — M25562 Pain in left knee: Secondary | ICD-10-CM | POA: Diagnosis not present

## 2021-09-08 DIAGNOSIS — Z20822 Contact with and (suspected) exposure to covid-19: Secondary | ICD-10-CM | POA: Diagnosis not present

## 2021-09-09 DIAGNOSIS — M25562 Pain in left knee: Secondary | ICD-10-CM | POA: Diagnosis not present

## 2021-09-14 DIAGNOSIS — M25562 Pain in left knee: Secondary | ICD-10-CM | POA: Diagnosis not present

## 2021-09-16 DIAGNOSIS — M25562 Pain in left knee: Secondary | ICD-10-CM | POA: Diagnosis not present

## 2021-09-20 DIAGNOSIS — Z471 Aftercare following joint replacement surgery: Secondary | ICD-10-CM | POA: Diagnosis not present

## 2021-09-20 DIAGNOSIS — Z4789 Encounter for other orthopedic aftercare: Secondary | ICD-10-CM | POA: Diagnosis not present

## 2021-09-21 DIAGNOSIS — M25562 Pain in left knee: Secondary | ICD-10-CM | POA: Diagnosis not present

## 2021-09-28 DIAGNOSIS — M25562 Pain in left knee: Secondary | ICD-10-CM | POA: Diagnosis not present

## 2021-10-03 ENCOUNTER — Encounter: Payer: Self-pay | Admitting: Internal Medicine

## 2021-10-06 DIAGNOSIS — Z20822 Contact with and (suspected) exposure to covid-19: Secondary | ICD-10-CM | POA: Diagnosis not present

## 2021-10-07 DIAGNOSIS — U071 COVID-19: Secondary | ICD-10-CM | POA: Diagnosis not present

## 2021-10-19 DIAGNOSIS — Z20822 Contact with and (suspected) exposure to covid-19: Secondary | ICD-10-CM | POA: Diagnosis not present

## 2021-10-22 DIAGNOSIS — Z20822 Contact with and (suspected) exposure to covid-19: Secondary | ICD-10-CM | POA: Diagnosis not present

## 2021-11-25 ENCOUNTER — Other Ambulatory Visit: Payer: Self-pay

## 2021-11-25 DIAGNOSIS — N183 Chronic kidney disease, stage 3 unspecified: Secondary | ICD-10-CM

## 2021-11-25 DIAGNOSIS — E1169 Type 2 diabetes mellitus with other specified complication: Secondary | ICD-10-CM

## 2021-11-25 DIAGNOSIS — D649 Anemia, unspecified: Secondary | ICD-10-CM

## 2021-11-25 DIAGNOSIS — I10 Essential (primary) hypertension: Secondary | ICD-10-CM

## 2021-11-28 ENCOUNTER — Inpatient Hospital Stay: Payer: Medicare Other | Attending: Hematology and Oncology

## 2021-11-28 ENCOUNTER — Encounter: Payer: Self-pay | Admitting: Hematology and Oncology

## 2021-11-28 ENCOUNTER — Inpatient Hospital Stay (HOSPITAL_BASED_OUTPATIENT_CLINIC_OR_DEPARTMENT_OTHER): Payer: Medicare Other | Admitting: Hematology and Oncology

## 2021-11-28 ENCOUNTER — Other Ambulatory Visit: Payer: Self-pay

## 2021-11-28 DIAGNOSIS — R0602 Shortness of breath: Secondary | ICD-10-CM | POA: Diagnosis not present

## 2021-11-28 DIAGNOSIS — N183 Chronic kidney disease, stage 3 unspecified: Secondary | ICD-10-CM | POA: Insufficient documentation

## 2021-11-28 DIAGNOSIS — E1142 Type 2 diabetes mellitus with diabetic polyneuropathy: Secondary | ICD-10-CM | POA: Insufficient documentation

## 2021-11-28 DIAGNOSIS — R059 Cough, unspecified: Secondary | ICD-10-CM | POA: Insufficient documentation

## 2021-11-28 DIAGNOSIS — D649 Anemia, unspecified: Secondary | ICD-10-CM | POA: Diagnosis not present

## 2021-11-28 DIAGNOSIS — Z86711 Personal history of pulmonary embolism: Secondary | ICD-10-CM | POA: Insufficient documentation

## 2021-11-28 DIAGNOSIS — M25561 Pain in right knee: Secondary | ICD-10-CM | POA: Insufficient documentation

## 2021-11-28 DIAGNOSIS — E1122 Type 2 diabetes mellitus with diabetic chronic kidney disease: Secondary | ICD-10-CM | POA: Diagnosis not present

## 2021-11-28 DIAGNOSIS — Z7901 Long term (current) use of anticoagulants: Secondary | ICD-10-CM | POA: Insufficient documentation

## 2021-11-28 DIAGNOSIS — E1169 Type 2 diabetes mellitus with other specified complication: Secondary | ICD-10-CM

## 2021-11-28 DIAGNOSIS — G8929 Other chronic pain: Secondary | ICD-10-CM | POA: Insufficient documentation

## 2021-11-28 DIAGNOSIS — M25562 Pain in left knee: Secondary | ICD-10-CM | POA: Insufficient documentation

## 2021-11-28 DIAGNOSIS — I10 Essential (primary) hypertension: Secondary | ICD-10-CM

## 2021-11-28 LAB — COMPREHENSIVE METABOLIC PANEL
ALT: 11 U/L (ref 0–44)
AST: 14 U/L — ABNORMAL LOW (ref 15–41)
Albumin: 3.9 g/dL (ref 3.5–5.0)
Alkaline Phosphatase: 77 U/L (ref 38–126)
Anion gap: 6 (ref 5–15)
BUN: 11 mg/dL (ref 8–23)
CO2: 27 mmol/L (ref 22–32)
Calcium: 9.7 mg/dL (ref 8.9–10.3)
Chloride: 103 mmol/L (ref 98–111)
Creatinine, Ser: 1 mg/dL (ref 0.44–1.00)
GFR, Estimated: >60 mL/min (ref >60)
Glucose, Bld: 175 mg/dL — ABNORMAL HIGH (ref 70–99)
Potassium: 4 mmol/L (ref 3.5–5.1)
Sodium: 136 mmol/L (ref 135–145)
Total Bilirubin: 0.3 mg/dL (ref 0.3–1.2)
Total Protein: 7.3 g/dL (ref 6.5–8.1)

## 2021-11-28 LAB — CBC WITH DIFFERENTIAL (CANCER CENTER ONLY)
Abs Immature Granulocytes: 0.02 10*3/uL (ref 0.00–0.07)
Basophils Absolute: 0 10*3/uL (ref 0.0–0.1)
Basophils Relative: 0 %
Eosinophils Absolute: 0.2 10*3/uL (ref 0.0–0.5)
Eosinophils Relative: 2 %
HCT: 36.5 % (ref 36.0–46.0)
Hemoglobin: 11.8 g/dL — ABNORMAL LOW (ref 12.0–15.0)
Immature Granulocytes: 0 %
Lymphocytes Relative: 23 %
Lymphs Abs: 1.7 10*3/uL (ref 0.7–4.0)
MCH: 28.2 pg (ref 26.0–34.0)
MCHC: 32.3 g/dL (ref 30.0–36.0)
MCV: 87.1 fL (ref 80.0–100.0)
Monocytes Absolute: 0.6 10*3/uL (ref 0.1–1.0)
Monocytes Relative: 8 %
Neutro Abs: 5 10*3/uL (ref 1.7–7.7)
Neutrophils Relative %: 67 %
Platelet Count: 284 10*3/uL (ref 150–400)
RBC: 4.19 MIL/uL (ref 3.87–5.11)
RDW: 14.9 % (ref 11.5–15.5)
WBC Count: 7.5 10*3/uL (ref 4.0–10.5)
nRBC: 0 % (ref 0.0–0.2)

## 2021-11-28 MED ORDER — APIXABAN 5 MG PO TABS: 5.0000 mg | ORAL_TABLET | Freq: Two times a day (BID) | ORAL | 11 refills | Status: DC

## 2021-11-28 NOTE — Assessment & Plan Note (Signed)
She has multifactorial anemia She is not symptomatic Observe only

## 2021-11-28 NOTE — Progress Notes (Signed)
[place] OFFICE PROGRESS NOTE  Plotnikov, [name], MD  ASSESSMENT & PLAN:  History of pulmonary embolus (PE) She had history of pulmonary embolism appears to be unprovoked The patient have other risk factors that could predispose her to recurrent thromboembolism For now, she will continue anticoagulation therapy indefinitely She does not need bridging treatment for future surgery   CKD (chronic kidney disease) stage 3, GFR 30-59 ml/min We discussed the importance of aggressive lifestyle changes, dietary modification, risk factor control and more oral fluid intake  Anemia She has multifactorial anemia She is not symptomatic Observe only  No orders of the defined types were placed in this encounter.   The total time spent in the appointment was 20 minutes encounter with patients including review of chart and various tests results, discussions about plan of care and coordination of care plan   All questions were answered. The patient knows to call the clinic with any problems, questions or concerns. No barriers to learning was detected.    [name], MD 6/12/202312:06 PM  INTERVAL HISTORY: Sierra Johnson 71 y.o. female returns for further follow-up for history of pulmonary embolism She denies recent bleeding complications after her knee surgery She is not better after her knee surgery She has persistent chronic knee pain She have lost a lot of weight initially prior to surgery but has gained some weight recently  SUMMARY OF HEMATOLOGIC HISTORY: Summary of Hematological History Patient reports that starting in early August 2019, she began to develop new onset cough and sinus symptoms, associated with low-grade fever, for which she was prescribed 3 different courses of antibiotics without improvement.  In early September 2019, she also developed new onset shortness of breath as well as chest wall soreness from repetitive coughing.  She did not have any  lower extremity swelling, warmth, tenderness or erythema.  Her PCP ordered a CT angiogram of the chest, which showed a right upper lobe PTE, for which she was sent to the ER for further management.  While in the hospital, patient was initially started on heparin drip, and the transition to Eliquis at discharge.  She denied any extended travel, being on hormone replacement therapy, or hospitalization/surgery prior to her diagnosis of VTE.  There is no personal or family history of malignancy.  Patient's mother had a blood clot after hip surgery, but there is no other family member with history of VTE. The patient had sedentary lifestyle She has class III obesity with diabetes Between 2019-2021, she is maintained on anticoagulation therapy but after a year of anticoagulation treatment, she is on low-dose preventative maintenance Eliquis  She is retired She is not able to mobilize much due to bilateral knee pain She struggle a lot with her weight She has several medication adjustments since she was last seen here She denies complications from diabetes such as peripheral neuropathy She was prescribed gabapentin for a while for unrelated reason but is no longer taking it She continues to have occasional cough and shortness of breath The patient denies any recent signs or symptoms of bleeding such as spontaneous epistaxis, hematuria or hematochezia. She was placed on Eliquis extended duration treatment at 2.5 mg twice daily indefinitely  I have reviewed the past medical history, past surgical history, social history and family history with the patient and they are unchanged from previous note.  ALLERGIES:  is allergic to benazepril, amlodipine, and tetracycline.  MEDICATIONS:  Current Outpatient Medications  Medication Sig Dispense Refill   Accu-Chek  FastClix Lancets MISC USE TO CHECK BLOOD SUGARS TWICE A DAY 102 each 0   apixaban (ELIQUIS) 5 MG TABS tablet Take 1 tablet (5 mg total) by mouth 2  (two) times daily. 60 tablet 11   Blood Glucose Monitoring Suppl (ACCU-CHEK GUIDE) w/Device KIT 1 each by Does not apply route 2 (two) times daily. DX: E11.9 1 kit 0   chlorpheniramine (CHLOR-TRIMETON) 4 MG tablet Take 4 mg by mouth 3 (three) times daily.     gabapentin (NEURONTIN) 300 MG capsule Take a 300 mg capsule three times a day for two weeks following surgery.Then take a 300 mg capsule two times a day for two weeks. Then take a 300 mg capsule once a day for two weeks. Then discontinue. 84 capsule 0   glucose blood (ACCU-CHEK GUIDE) test strip UUSE TO CHECK BLOOD SUGARS TWICE A DAY 200 strip 1   Melatonin 10 MG CAPS Take 10 mg by mouth at bedtime as needed (sleep).     metFORMIN (GLUCOPHAGE) 500 MG tablet TAKE 1 TABLET 2 TIMES DAILY WITH A MEAL. ANNUAL APPT IS DUE W/LABS MUST SEE PROVIDER REFILLS 180 tablet 3   methocarbamol (ROBAXIN) 500 MG tablet Take 1 tablet (500 mg total) by mouth every 6 (six) hours as needed for muscle spasms. 40 tablet 0   olmesartan (BENICAR) 20 MG tablet TAKE 1 TABLET (20 MG TOTAL) BY MOUTH DAILY. FOLLOW-UP APPT W/ LABS DUE IN JANUARY MUST SEE PROVIDER FOR FUTURE REFILLS 90 tablet 3   ondansetron (ZOFRAN) 4 MG tablet Take 4 mg by mouth every 6 (six) hours as needed for nausea/vomiting.     oxyCODONE (OXY IR/ROXICODONE) 5 MG immediate release tablet Take 1-2 tablets (5-10 mg total) by mouth every 6 (six) hours as needed for severe pain. 42 tablet 0   pravastatin (PRAVACHOL) 20 MG tablet TAKE 1 TABLET BY MOUTH EVERY DAY 90 tablet 3   traMADol (ULTRAM) 50 MG tablet Take 1-2 tablets (50-100 mg total) by mouth every 6 (six) hours as needed for moderate pain. 40 tablet 0   No current facility-administered medications for this visit.     REVIEW OF SYSTEMS:   Constitutional: Denies fevers, chills or night sweats Eyes: Denies blurriness of vision Ears, nose, mouth, throat, and face: Denies mucositis or sore throat Respiratory: Denies cough, dyspnea or  wheezes Cardiovascular: Denies palpitation, chest discomfort or lower extremity swelling Gastrointestinal:  Denies nausea, heartburn or change in bowel habits Skin: Denies abnormal skin rashes Lymphatics: Denies new lymphadenopathy or easy bruising Neurological:Denies numbness, tingling or new weaknesses Behavioral/Psych: Mood is stable, no new changes  All other systems were reviewed with the patient and are negative.  PHYSICAL EXAMINATION: ECOG PERFORMANCE STATUS: 0 - Asymptomatic  Vitals:   11/28/21 1142  BP: 139/76  Pulse: 73  Resp: 18  Temp: 97.8 F (36.6 C)  SpO2: 100%   Filed Weights   11/28/21 1142  Weight: 201 lb 3.2 oz (91.3 kg)    GENERAL:alert, no distress and comfortable NEURO: alert & oriented x 3 with fluent speech, no focal motor/sensory deficits  LABORATORY DATA:  I have reviewed the data as listed     Component Value Date/Time   NA 137 08/03/2021 1002   K 4.2 08/03/2021 1002   CL 105 08/03/2021 1002   CO2 25 08/03/2021 1002   GLUCOSE 125 (H) 08/03/2021 1002   GLUCOSE 74 05/17/2006 0957   BUN 8 08/03/2021 1002   CREATININE 0.80 08/03/2021 1002   CREATININE 1.04 (H) 01/17/2021 0830  CALCIUM 9.5 08/03/2021 1002   PROT 7.3 08/03/2021 1002   ALBUMIN 3.8 08/03/2021 1002   AST 19 08/03/2021 1002   AST 17 01/17/2021 0830   ALT 16 08/03/2021 1002   ALT 14 01/17/2021 0830   ALKPHOS 57 08/03/2021 1002   BILITOT 0.4 08/03/2021 1002   BILITOT 0.4 01/17/2021 0830   GFRNONAA >60 08/03/2021 1002   GFRNONAA 59 (L) 01/17/2021 0830   GFRAA 55 (L) 11/28/2019 1028    No results found for: "SPEP", "UPEP"  Lab Results  Component Value Date   WBC 7.5 11/28/2021   NEUTROABS 5.0 11/28/2021   HGB 11.8 (L) 11/28/2021   HCT 36.5 11/28/2021   MCV 87.1 11/28/2021   PLT 284 11/28/2021      Chemistry      Component Value Date/Time   NA 137 08/03/2021 1002   K 4.2 08/03/2021 1002   CL 105 08/03/2021 1002   CO2 25 08/03/2021 1002   BUN 8 08/03/2021 1002    CREATININE 0.80 08/03/2021 1002   CREATININE 1.04 (H) 01/17/2021 0830      Component Value Date/Time   CALCIUM 9.5 08/03/2021 1002   ALKPHOS 57 08/03/2021 1002   AST 19 08/03/2021 1002   AST 17 01/17/2021 0830   ALT 16 08/03/2021 1002   ALT 14 01/17/2021 0830   BILITOT 0.4 08/03/2021 1002   BILITOT 0.4 01/17/2021 0830

## 2021-11-28 NOTE — Assessment & Plan Note (Signed)
We discussed the importance of aggressive lifestyle changes, dietary modification, risk factor control and more oral fluid intake

## 2021-11-28 NOTE — Assessment & Plan Note (Signed)
She had history of pulmonary embolism appears to be unprovoked The patient have other risk factors that could predispose her to recurrent thromboembolism For now, she will continue anticoagulation therapy indefinitely She does not need bridging treatment for future surgery

## 2022-01-07 ENCOUNTER — Encounter: Payer: Self-pay | Admitting: Internal Medicine

## 2022-01-12 ENCOUNTER — Other Ambulatory Visit: Payer: Self-pay | Admitting: Hematology and Oncology

## 2022-01-12 ENCOUNTER — Encounter: Payer: Self-pay | Admitting: Hematology and Oncology

## 2022-01-12 MED ORDER — APIXABAN 2.5 MG PO TABS: 2.5000 mg | ORAL_TABLET | Freq: Two times a day (BID) | ORAL | 3 refills | Status: DC

## 2022-01-16 DIAGNOSIS — E669 Obesity, unspecified: Secondary | ICD-10-CM | POA: Diagnosis not present

## 2022-01-17 DIAGNOSIS — M1389 Other specified arthritis, multiple sites: Secondary | ICD-10-CM | POA: Diagnosis not present

## 2022-01-17 DIAGNOSIS — N958 Other specified menopausal and perimenopausal disorders: Secondary | ICD-10-CM | POA: Diagnosis not present

## 2022-01-17 DIAGNOSIS — Z6838 Body mass index (BMI) 38.0-38.9, adult: Secondary | ICD-10-CM | POA: Diagnosis not present

## 2022-01-17 DIAGNOSIS — Z1272 Encounter for screening for malignant neoplasm of vagina: Secondary | ICD-10-CM | POA: Diagnosis not present

## 2022-01-17 DIAGNOSIS — N952 Postmenopausal atrophic vaginitis: Secondary | ICD-10-CM | POA: Diagnosis not present

## 2022-01-17 DIAGNOSIS — Z124 Encounter for screening for malignant neoplasm of cervix: Secondary | ICD-10-CM | POA: Diagnosis not present

## 2022-01-17 DIAGNOSIS — M8588 Other specified disorders of bone density and structure, other site: Secondary | ICD-10-CM | POA: Diagnosis not present

## 2022-01-17 DIAGNOSIS — Z1231 Encounter for screening mammogram for malignant neoplasm of breast: Secondary | ICD-10-CM | POA: Diagnosis not present

## 2022-01-23 ENCOUNTER — Other Ambulatory Visit: Payer: BLUE CROSS/BLUE SHIELD

## 2022-01-23 ENCOUNTER — Ambulatory Visit: Payer: BLUE CROSS/BLUE SHIELD | Admitting: Hematology and Oncology

## 2022-01-25 ENCOUNTER — Encounter (INDEPENDENT_AMBULATORY_CARE_PROVIDER_SITE_OTHER): Payer: Self-pay

## 2022-01-31 ENCOUNTER — Telehealth: Payer: Self-pay | Admitting: Pulmonary Disease

## 2022-01-31 DIAGNOSIS — G4733 Obstructive sleep apnea (adult) (pediatric): Secondary | ICD-10-CM

## 2022-01-31 NOTE — Telephone Encounter (Signed)
Called patient and she did make appointmetn to see Dr Elsworth Soho on September 27,2023. She states that she needs supplies before that visit. I did advise patient that I can attempt to send in supplies but I am not sure if it will go through since her last OV was in 2021.   Dr Elsworth Soho I wanted to make you aware of supply request.

## 2022-02-09 DIAGNOSIS — Z96652 Presence of left artificial knee joint: Secondary | ICD-10-CM | POA: Diagnosis not present

## 2022-02-10 ENCOUNTER — Ambulatory Visit: Payer: Self-pay | Admitting: Licensed Clinical Social Worker

## 2022-02-10 NOTE — Patient Outreach (Signed)
  Care Coordination   Initial Visit Note   02/10/2022 Name: Sierra Johnson MRN: [id number] DOB: 12-19-1950  Sierra Johnson is a 71 y.o. year old female who sees Plotnikov, [name], MD for primary care. I spoke with  Sierra Johnson by phone today.  What matters to the patients health and wellness today? Patient declined services    Goals Addressed               This Visit's Progress     patient declined program services at this time (pt-stated)        Care Coordination Interventions:   Active listening / Reflection utilized  Informed client about Care Coordination program services Patient declined program services at this time      SDOH assessments and interventions completed:  No   Care Coordination Interventions Activated:  No  Care Coordination Interventions:  No, not indicated   Follow up plan: No further intervention required.   Encounter Outcome:  Pt. Visit Completed

## 2022-02-10 NOTE — Patient Instructions (Addendum)
Visit Information  Thank you for taking time to visit with me today. Please don't hesitate to contact me if I can be of assistance to you before our next scheduled telephone appointment.  Following are the goals we discussed today:   No further intervention required at this time  Please call the care guide team at [phone] if you need to cancel or reschedule your appointment.   If you are experiencing a Mental Health or [place] or need someone to talk to, please go to [facility] [phone])   Following is a copy of your full plan of care:   Care Coordination Interventions:  Active listening / Reflection utilized  Informed client about Care Coordination program services Patient declined program services at this time   [name] was given information about Care Management services by the embedded care coordination team including:  Care Management services include personalized support from designated clinical staff supervised by her physician, including individualized plan of care and coordination with other care providers 24/7 contact phone numbers for assistance for urgent and routine care needs. The patient may stop CCM services at any time (effective at the end of the month) by phone call to the office staff.  Patient agreed to services and verbal consent obtained.   Norva Riffle.Sierra Johnson MSW, [place] representative [facility] Care Management [phone]

## 2022-02-16 DIAGNOSIS — E669 Obesity, unspecified: Secondary | ICD-10-CM | POA: Diagnosis not present

## 2022-03-02 DIAGNOSIS — Z23 Encounter for immunization: Secondary | ICD-10-CM | POA: Diagnosis not present

## 2022-03-12 DIAGNOSIS — Z23 Encounter for immunization: Secondary | ICD-10-CM | POA: Diagnosis not present

## 2022-03-15 ENCOUNTER — Encounter: Payer: Self-pay | Admitting: Pulmonary Disease

## 2022-03-15 ENCOUNTER — Ambulatory Visit (INDEPENDENT_AMBULATORY_CARE_PROVIDER_SITE_OTHER): Payer: Medicare Other | Admitting: Pulmonary Disease

## 2022-03-15 VITALS — BP 128/70 | HR 58 | Temp 98.1°F | Ht 60.0 in | Wt 201.8 lb

## 2022-03-15 DIAGNOSIS — R948 Abnormal results of function studies of other organs and systems: Secondary | ICD-10-CM

## 2022-03-15 DIAGNOSIS — G4733 Obstructive sleep apnea (adult) (pediatric): Secondary | ICD-10-CM | POA: Diagnosis not present

## 2022-03-15 DIAGNOSIS — R059 Cough, unspecified: Secondary | ICD-10-CM

## 2022-03-15 NOTE — Assessment & Plan Note (Signed)
CPAP download was reviewed which shows excellent control of events on 12 cm with mild leak and good compliance more than 7 hours every night. She is very compliant and CPAP is certainly helped improve her daytime somnolence and fatigue Sleep supplies will be renewed for a year  Weight loss encouraged, compliance with goal of at least 4-6 hrs every night is the expectation. Advised against medications with sedative side effects Cautioned against driving when sleepy - understanding that sleepiness will vary on a day to day basis

## 2022-03-15 NOTE — Patient Instructions (Addendum)
  CPAP is working well on 12 cm.  X we can do a high-resolution CT scan to see if scarring is worse at the bottom of the lungs causing your chronic cough

## 2022-03-15 NOTE — Assessment & Plan Note (Signed)
She has mild bibasilar bronchiectasis and chronic cough may be related to this.  Other etiology could be postnasal drip.  We will obtain high-resolution CT chest to see if there is any interim progression, if so he may have to consider MAC in the differential

## 2022-03-15 NOTE — Progress Notes (Signed)
   Subjective:    Patient ID: Sierra Johnson, female    DOB: 1950-06-27, 71 y.o.   MRN: [id number]  HPI  71 yo woman for follow-up of OSA and unprovoked PE 02/2018 -Chronic perennial cough   Reports chronic cough since 2019, she went to pulmonologist at [facility] Dr. Mariana Arn -treated for allergic rhinitis and Nexium and then recommended gabapentin.  Chief Complaint  Patient presents with   Follow-up    Pt is doing well and is using CPAP nightly    2-year follow-up visit.  She remains compliant with her CPAP and denies any problems with mask or pressure.  She needs CPAP supplies. She continues to have a cough, no interim chest colds or exacerbations. Previous CT had shown mild bronchiectasis    Significant tests/ events reviewed PFTs 03/2019 wnl    HRCT 11/2019 >> no ILD, Postinfectious/postinflammatory scarring in both lower lobes. LT 4.4 cm renal cyst , sub cm angiomyolipoma   CT chest 03/2019 showed new tree-in-bud infiltrates and groundglass opacities in lingula with bilateral lower lobe atelectasis and scarring and bronchiectasis 02/2018 CT angiogram chest showed pulmonary embolism in the right upper lobe.  There was also bibasilar airspace disease and radiologist favored atelectasis or consolidation left worse than right.   Review of Systems neg for any significant sore throat, dysphagia, itching, sneezing, nasal congestion or excess/ purulent secretions, fever, chills, sweats, unintended wt loss, pleuritic or exertional cp, hempoptysis, orthopnea pnd or change in chronic leg swelling. Also denies presyncope, palpitations, heartburn, abdominal pain, nausea, vomiting, diarrhea or change in bowel or urinary habits, dysuria,hematuria, rash, arthralgias, visual complaints, headache, numbness weakness or ataxia.     Objective:   Physical Exam  Gen. Pleasant, obese, in no distress ENT - no lesions, no post nasal drip Neck: No JVD, no thyromegaly, no carotid bruits Lungs: no use  of accessory muscles, no dullness to percussion, decreased without rales or rhonchi  Cardiovascular: Rhythm regular, heart sounds  normal, no murmurs or gallops, no peripheral edema Musculoskeletal: No deformities, no cyanosis or clubbing , no tremors        Assessment & Plan:

## 2022-03-18 ENCOUNTER — Encounter: Payer: Self-pay | Admitting: Hematology and Oncology

## 2022-03-18 DIAGNOSIS — E669 Obesity, unspecified: Secondary | ICD-10-CM | POA: Diagnosis not present

## 2022-04-06 ENCOUNTER — Other Ambulatory Visit: Payer: BLUE CROSS/BLUE SHIELD

## 2022-04-07 ENCOUNTER — Other Ambulatory Visit: Payer: BLUE CROSS/BLUE SHIELD

## 2022-04-11 ENCOUNTER — Ambulatory Visit
Admission: RE | Admit: 2022-04-11 | Discharge: 2022-04-11 | Disposition: A | Discharge status: Home / Self Care | Payer: Medicare Other | Source: Ambulatory Visit | Attending: Pulmonary Disease | Admitting: Pulmonary Disease

## 2022-04-11 DIAGNOSIS — I700 Atherosclerosis of aorta: Secondary | ICD-10-CM | POA: Diagnosis not present

## 2022-04-11 DIAGNOSIS — Z86711 Personal history of pulmonary embolism: Secondary | ICD-10-CM | POA: Diagnosis not present

## 2022-04-11 DIAGNOSIS — R948 Abnormal results of function studies of other organs and systems: Secondary | ICD-10-CM

## 2022-04-11 DIAGNOSIS — K449 Diaphragmatic hernia without obstruction or gangrene: Secondary | ICD-10-CM | POA: Diagnosis not present

## 2022-04-11 DIAGNOSIS — R059 Cough, unspecified: Secondary | ICD-10-CM

## 2022-04-11 DIAGNOSIS — J479 Bronchiectasis, uncomplicated: Secondary | ICD-10-CM | POA: Diagnosis not present

## 2022-04-13 ENCOUNTER — Telehealth: Payer: Self-pay | Admitting: Pulmonary Disease

## 2022-04-13 DIAGNOSIS — R059 Cough, unspecified: Secondary | ICD-10-CM

## 2022-04-13 NOTE — Telephone Encounter (Signed)
Called and spoke to patient about her CT results. Went over results with patient and she verbalized understanding. Sent flutter valve into pharmacy as requested. Verified pharmacy. Nothing further needed

## 2022-04-18 ENCOUNTER — Telehealth: Payer: Self-pay | Admitting: *Deleted

## 2022-04-18 DIAGNOSIS — E669 Obesity, unspecified: Secondary | ICD-10-CM | POA: Diagnosis not present

## 2022-04-18 NOTE — Telephone Encounter (Signed)
patient called and states she has not received her medication order at her pharmacy.  Pt states she can not recall what medication it is and would like someone to call her about the prescription.  Please call and advise [phone]

## 2022-04-19 NOTE — Telephone Encounter (Signed)
[name], MD  04/12/2022  9:01 PM EDT     Similar changes of bronchiectasis, scarring and mucous plugging especially at the right base -She should use Mucinex and flutter valve to help with the cough -If persistent can provide her with nebulizer and hypertonic saline to clear mucus if any    Pt called back. Stated to her the info about the flutter valve and that this does not come from local pharmacy. Pt was told that she could come by the office to pick this up. Stated to pt that we did have one at the office that she could come by to pick up and understanding was verbalized. Nothing further needed.

## 2022-04-19 NOTE — Telephone Encounter (Signed)
ATC LVMTCB x 1  

## 2022-05-19 ENCOUNTER — Encounter (HOSPITAL_BASED_OUTPATIENT_CLINIC_OR_DEPARTMENT_OTHER): Payer: Self-pay | Admitting: Pulmonary Disease

## 2022-05-23 ENCOUNTER — Encounter: Payer: Self-pay | Admitting: Internal Medicine

## 2022-05-23 ENCOUNTER — Ambulatory Visit (INDEPENDENT_AMBULATORY_CARE_PROVIDER_SITE_OTHER): Payer: Medicare Other | Admitting: Internal Medicine

## 2022-05-23 VITALS — BP 122/78 | HR 60 | Temp 97.8°F | Ht 60.0 in | Wt 203.0 lb

## 2022-05-23 DIAGNOSIS — E785 Hyperlipidemia, unspecified: Secondary | ICD-10-CM | POA: Insufficient documentation

## 2022-05-23 DIAGNOSIS — Z6839 Body mass index (BMI) 39.0-39.9, adult: Secondary | ICD-10-CM

## 2022-05-23 DIAGNOSIS — N183 Chronic kidney disease, stage 3 unspecified: Secondary | ICD-10-CM | POA: Diagnosis not present

## 2022-05-23 DIAGNOSIS — R944 Abnormal results of kidney function studies: Secondary | ICD-10-CM | POA: Diagnosis not present

## 2022-05-23 DIAGNOSIS — E1169 Type 2 diabetes mellitus with other specified complication: Secondary | ICD-10-CM

## 2022-05-23 DIAGNOSIS — E119 Type 2 diabetes mellitus without complications: Secondary | ICD-10-CM | POA: Diagnosis not present

## 2022-05-23 DIAGNOSIS — E669 Obesity, unspecified: Secondary | ICD-10-CM

## 2022-05-23 DIAGNOSIS — J4521 Mild intermittent asthma with (acute) exacerbation: Secondary | ICD-10-CM | POA: Diagnosis not present

## 2022-05-23 DIAGNOSIS — Z0000 Encounter for general adult medical examination without abnormal findings: Secondary | ICD-10-CM

## 2022-05-23 LAB — MICROALBUMIN / CREATININE URINE RATIO
Creatinine,U: 51.9 mg/dL
Microalb Creat Ratio: 10 mg/g (ref 0.0–30.0)
Microalb, Ur: 5.2 mg/dL — ABNORMAL HIGH (ref 0.0–1.9)

## 2022-05-23 LAB — LIPID PANEL
Cholesterol: 164 mg/dL (ref 0–200)
HDL: 76.9 mg/dL (ref >39.00)
LDL Cholesterol: 75 mg/dL (ref 0–99)
NonHDL: 87.48
Total CHOL/HDL Ratio: 2
Triglycerides: 61 mg/dL (ref 0.0–149.0)
VLDL: 12.2 mg/dL (ref 0.0–40.0)

## 2022-05-23 LAB — COMPREHENSIVE METABOLIC PANEL
ALT: 14 U/L (ref 0–35)
AST: 18 U/L (ref 0–37)
Albumin: 4.1 g/dL (ref 3.5–5.2)
Alkaline Phosphatase: 74 U/L (ref 39–117)
BUN: 11 mg/dL (ref 6–23)
CO2: 27 mEq/L (ref 19–32)
Calcium: 9.4 mg/dL (ref 8.4–10.5)
Chloride: 103 mEq/L (ref 96–112)
Creatinine, Ser: 1.07 mg/dL (ref 0.40–1.20)
GFR: 53.19 mL/min — ABNORMAL LOW (ref >60.00)
Glucose, Bld: 101 mg/dL — ABNORMAL HIGH (ref 70–99)
Potassium: 4.4 mEq/L (ref 3.5–5.1)
Sodium: 137 mEq/L (ref 135–145)
Total Bilirubin: 0.4 mg/dL (ref 0.2–1.2)
Total Protein: 7.8 g/dL (ref 6.0–8.3)

## 2022-05-23 LAB — TSH: TSH: 1.74 u[IU]/mL (ref 0.35–5.50)

## 2022-05-23 LAB — CBC WITH DIFFERENTIAL/PLATELET
Basophils Absolute: 0 10*3/uL (ref 0.0–0.1)
Basophils Relative: 0.4 % (ref 0.0–3.0)
Eosinophils Absolute: 0.1 10*3/uL (ref 0.0–0.7)
Eosinophils Relative: 1.7 % (ref 0.0–5.0)
HCT: 37.2 % (ref 36.0–46.0)
Hemoglobin: 12 g/dL (ref 12.0–15.0)
Lymphocytes Relative: 25 % (ref 12.0–46.0)
Lymphs Abs: 1.9 10*3/uL (ref 0.7–4.0)
MCHC: 32.3 g/dL (ref 30.0–36.0)
MCV: 86.4 fl (ref 78.0–100.0)
Monocytes Absolute: 0.7 10*3/uL (ref 0.1–1.0)
Monocytes Relative: 9.1 % (ref 3.0–12.0)
Neutro Abs: 4.8 10*3/uL (ref 1.4–7.7)
Neutrophils Relative %: 63.8 % (ref 43.0–77.0)
Platelets: 293 10*3/uL (ref 150.0–400.0)
RBC: 4.3 Mil/uL (ref 3.87–5.11)
RDW: 15.1 % (ref 11.5–15.5)
WBC: 7.6 10*3/uL (ref 4.0–10.5)

## 2022-05-23 LAB — URINALYSIS
Bilirubin Urine: NEGATIVE
Hgb urine dipstick: NEGATIVE
Ketones, ur: NEGATIVE
Leukocytes,Ua: NEGATIVE
Nitrite: NEGATIVE
Specific Gravity, Urine: <=1.005 — AB (ref 1.000–1.030)
Total Protein, Urine: NEGATIVE
Urine Glucose: NEGATIVE
Urobilinogen, UA: 0.2 (ref 0.0–1.0)
pH: 6 (ref 5.0–8.0)

## 2022-05-23 LAB — HEMOGLOBIN A1C: Hgb A1c MFr Bld: 6.4 % (ref 4.6–6.5)

## 2022-05-23 LAB — HM DIABETES EYE EXAM

## 2022-05-23 MED ORDER — PRAVASTATIN SODIUM 20 MG PO TABS: 20.0000 mg | ORAL_TABLET | Freq: Every day | ORAL | 3 refills | Status: DC

## 2022-05-23 MED ORDER — METFORMIN HCL 500 MG PO TABS: ORAL_TABLET | ORAL | 3 refills | Status: DC

## 2022-05-23 MED ORDER — OLMESARTAN MEDOXOMIL 20 MG PO TABS: 20.0000 mg | ORAL_TABLET | Freq: Every day | ORAL | 3 refills | Status: DC

## 2022-05-23 MED ORDER — BUDESONIDE-FORMOTEROL FUMARATE 80-4.5 MCG/ACT IN AERO: 2.0000 | INHALATION_SPRAY | Freq: Two times a day (BID) | RESPIRATORY_TRACT | 3 refills | Status: DC

## 2022-05-23 MED ORDER — MOLNUPIRAVIR EUA 200MG CAPSULE: 4.0000 | ORAL_CAPSULE | Freq: Two times a day (BID) | ORAL | 0 refills | Status: DC

## 2022-05-23 NOTE — Assessment & Plan Note (Signed)
Check A1c. 

## 2022-05-23 NOTE — Assessment & Plan Note (Signed)
Worse Start Symbicort

## 2022-05-23 NOTE — Assessment & Plan Note (Addendum)
We discussed age appropriate health related issues, including available/recomended screening tests and vaccinations. We discussed a need for adhering to healthy di et and exercise. Labs were ordered. All questions were answered. She is a Sports administrator. Colon 2008, 2000 Cologuard 2018 Shingrix, pneumovax discussed GYN and mammo yearly [place], tDAP 2020

## 2022-05-23 NOTE — Progress Notes (Signed)
Subjective:  Patient ID: Sierra Johnson, female    DOB: Nov 14, 1950  Age: 71 y.o. MRN: [id number]  CC: Annual Exam   HPI Sierra Johnson presents for a well exam  Outpatient Medications Prior to Visit  Medication Sig Dispense Refill   Accu-Chek FastClix Lancets MISC USE TO CHECK BLOOD SUGARS TWICE A DAY 102 each 0   apixaban (ELIQUIS) 2.5 MG TABS tablet Take 1 tablet (2.5 mg total) by mouth 2 (two) times daily. 60 tablet 3   Blood Glucose Monitoring Suppl (ACCU-CHEK GUIDE) w/Device KIT 1 each by Does not apply route 2 (two) times daily. DX: E11.9 1 kit 0   chlorpheniramine (CHLOR-TRIMETON) 4 MG tablet Take 4 mg by mouth 3 (three) times daily.     gabapentin (NEURONTIN) 300 MG capsule Take a 300 mg capsule three times a day for two weeks following surgery.Then take a 300 mg capsule two times a day for two weeks. Then take a 300 mg capsule once a day for two weeks. Then discontinue. 84 capsule 0   glucose blood (ACCU-CHEK GUIDE) test strip UUSE TO CHECK BLOOD SUGARS TWICE A DAY 200 strip 1   Melatonin 10 MG CAPS Take 10 mg by mouth at bedtime as needed (sleep).     methocarbamol (ROBAXIN) 500 MG tablet Take 1 tablet (500 mg total) by mouth every 6 (six) hours as needed for muscle spasms. 40 tablet 0   ondansetron (ZOFRAN) 4 MG tablet Take 4 mg by mouth every 6 (six) hours as needed for nausea/vomiting.     oxyCODONE (OXY IR/ROXICODONE) 5 MG immediate release tablet Take 1-2 tablets (5-10 mg total) by mouth every 6 (six) hours as needed for severe pain. 42 tablet 0   traMADol (ULTRAM) 50 MG tablet Take 1-2 tablets (50-100 mg total) by mouth every 6 (six) hours as needed for moderate pain. 40 tablet 0   metFORMIN (GLUCOPHAGE) 500 MG tablet TAKE 1 TABLET 2 TIMES DAILY WITH A MEAL. ANNUAL APPT IS DUE W/LABS MUST SEE PROVIDER REFILLS 180 tablet 3   olmesartan (BENICAR) 20 MG tablet TAKE 1 TABLET (20 MG TOTAL) BY MOUTH DAILY. FOLLOW-UP APPT W/ LABS DUE IN JANUARY MUST SEE PROVIDER FOR FUTURE REFILLS  90 tablet 3   pravastatin (PRAVACHOL) 20 MG tablet TAKE 1 TABLET BY MOUTH EVERY DAY 90 tablet 3   No facility-administered medications prior to visit.    ROS: Review of Systems  Constitutional:  Negative for activity change, appetite change, chills, fatigue and unexpected weight change.  HENT:  Negative for congestion, mouth sores and sinus pressure.   Eyes:  Negative for visual disturbance.  Respiratory:  Negative for cough and chest tightness.   Gastrointestinal:  Negative for abdominal pain and nausea.  Genitourinary:  Negative for difficulty urinating, frequency and vaginal pain.  Musculoskeletal:  Negative for back pain and gait problem.  Skin:  Negative for pallor and rash.  Neurological:  Negative for dizziness, tremors, weakness, numbness and headaches.  Psychiatric/Behavioral:  Negative for confusion, sleep disturbance and suicidal ideas.     Objective:  BP 122/78 (BP Location: Left Arm, Patient Position: Sitting, Cuff Size: Normal)   Pulse 60   Temp 97.8 F (36.6 C) (Oral)   Ht 5' (1.524 m)   Wt 203 lb (92.1 kg)   SpO2 98%   BMI 39.65 kg/m   BP Readings from Last 3 Encounters:  05/23/22 122/78  03/15/22 128/70  11/28/21 139/76    Wt Readings from Last 3 Encounters:  05/23/22  203 lb (92.1 kg)  03/15/22 201 lb 12.8 oz (91.5 kg)  11/28/21 201 lb 3.2 oz (91.3 kg)    Physical Exam Constitutional:      General: She is not in acute distress.    Appearance: She is well-developed. She is obese.  HENT:     Head: Normocephalic.     Right Ear: External ear normal.     Left Ear: External ear normal.     Nose: Nose normal.  Eyes:     General:        Right eye: No discharge.        Left eye: No discharge.     Conjunctiva/sclera: Conjunctivae normal.     Pupils: Pupils are equal, round, and reactive to light.  Neck:     Thyroid: No thyromegaly.     Vascular: No JVD.     Trachea: No tracheal deviation.  Cardiovascular:     Rate and Rhythm: Normal rate and  regular rhythm.     Heart sounds: Normal heart sounds.  Pulmonary:     Effort: No respiratory distress.     Breath sounds: No stridor. No wheezing.  Abdominal:     General: Bowel sounds are normal. There is no distension.     Palpations: Abdomen is soft. There is no mass.     Tenderness: There is no abdominal tenderness. There is no guarding or rebound.  Musculoskeletal:        General: No tenderness.     Cervical back: Normal range of motion and neck supple. No rigidity.  Lymphadenopathy:     Cervical: No cervical adenopathy.  Skin:    Findings: No erythema or rash.  Neurological:     Cranial Nerves: No cranial nerve deficit.     Motor: No abnormal muscle tone.     Coordination: Coordination normal.     Deep Tendon Reflexes: Reflexes normal.  Psychiatric:        Behavior: Behavior normal.        Thought Content: Thought content normal.        Judgment: Judgment normal.   B rhonchi  Lab Results  Component Value Date   WBC 7.6 05/23/2022   HGB 12.0 05/23/2022   HCT 37.2 05/23/2022   PLT 293.0 05/23/2022   GLUCOSE 101 (H) 05/23/2022   CHOL 164 05/23/2022   TRIG 61.0 05/23/2022   HDL 76.90 05/23/2022   LDLDIRECT 119.5 08/27/2012   LDLCALC 75 05/23/2022   ALT 14 05/23/2022   AST 18 05/23/2022   NA 137 05/23/2022   K 4.4 05/23/2022   CL 103 05/23/2022   CREATININE 1.07 05/23/2022   BUN 11 05/23/2022   CO2 27 05/23/2022   TSH 1.74 05/23/2022   INR 1.1 08/03/2021   HGBA1C 6.4 05/23/2022   MICROALBUR 5.2 (H) 05/23/2022    CT Chest High Resolution  Result Date: 04/12/2022 CLINICAL DATA:  Scarring, ongoing congestive cough, history of pulmonary embolism, sleep apnea EXAM: CT CHEST WITHOUT CONTRAST TECHNIQUE: Multidetector CT imaging of the chest was performed following the standard protocol without intravenous contrast. High resolution imaging of the lungs, as well as inspiratory and expiratory imaging, was performed. RADIATION DOSE REDUCTION: This exam was performed  according to the departmental dose-optimization program which includes automated exposure control, adjustment of the mA and/or kV according to patient size and/or use of iterative reconstruction technique. COMPARISON:  11/19/2019 FINDINGS: Cardiovascular: Scattered aortic atherosclerosis. Normal heart size. No pericardial effusion. Mediastinum/Nodes: No enlarged mediastinal, hilar, or axillary lymph nodes.  Small hiatal hernia. Thyroid gland, trachea, and esophagus demonstrate no significant findings. Lungs/Pleura: Similar appearance of bland appearing, bandlike scarring of the lung bases, with mild, tubular bronchiectasis, bronchiolar plugging, and postobstructive consolidation, particularly in the right lung base (series 3, image 91). Mild, lobular air trapping on expiratory phase imaging. Upper Abdomen: No acute abnormality. Musculoskeletal: No chest wall abnormality. No acute osseous findings. IMPRESSION: 1. No evidence of fibrotic interstitial lung disease. Similar appearance of bland appearing, bandlike scarring of the lung bases, with mild, tubular bronchiectasis, bronchiolar plugging, and postobstructive consolidation, particularly in the right lung base. Findings are consistent with sequelae of infection or aspiration, which may be ongoing. 2. Mild, lobular air trapping on expiratory phase imaging, suggestive of small airways disease. 3. Small hiatal hernia. Aortic Atherosclerosis (ICD10-I70.0). Electronically Signed   By: Delanna Ahmadi M.D.   On: 04/12/2022 13:53    Assessment & Plan:   Problem List Items Addressed This Visit     Asthmatic bronchitis    Worse Start Symbicort      Relevant Medications   budesonide-formoterol (SYMBICORT) 80-4.5 MCG/ACT inhaler   Other Relevant Orders   CBC with Differential/Platelet (Completed)   CKD (chronic kidney disease) stage 3, GFR 30-59 ml/min (HCC)    Likely related to diabetes, hypertension, IV contrast exposure.  Hydrate well.  Will obtain renal  ultrasound      Decreased GFR    Likely related to diabetes, hypertension, IV contrast exposure.  Hydrate well.  Will obtain renal ultrasound      Diabetes mellitus type 2 in obese (HCC)    Check A1c      Relevant Medications   metFORMIN (GLUCOPHAGE) 500 MG tablet   olmesartan (BENICAR) 20 MG tablet   pravastatin (PRAVACHOL) 20 MG tablet   Other Relevant Orders   Hemoglobin A1c (Completed)   Microalbumin / creatinine urine ratio (Completed)   Well adult exam - Primary    We discussed age appropriate health related issues, including available/recomended screening tests and vaccinations. We discussed a need for adhering to healthy di et and exercise. Labs were ordered. All questions were answered. She is a Sports administrator. Colon 2008, 2000 Cologuard 2018 Shingrix, pneumovax discussed GYN and mammo yearly Prevnar, tDAP 2020      Relevant Orders   TSH (Completed)   Urinalysis (Completed)   CBC with Differential/Platelet (Completed)   Lipid panel (Completed)   Comprehensive metabolic panel (Completed)   Hemoglobin A1c (Completed)   Microalbumin / creatinine urine ratio (Completed)      Meds ordered this encounter  Medications   metFORMIN (GLUCOPHAGE) 500 MG tablet    Sig: TAKE 1 TABLET 2 TIMES DAILY WITH A MEAL. ANNUAL APPT IS DUE W/LABS MUST SEE PROVIDER REFILLS    Dispense:  180 tablet    Refill:  3   olmesartan (BENICAR) 20 MG tablet    Sig: Take 1 tablet (20 mg total) by mouth daily. Follow-up appt w/ LABS due in [place] must see provider for future refills    Dispense:  90 tablet    Refill:  3   pravastatin (PRAVACHOL) 20 MG tablet    Sig: Take 1 tablet (20 mg total) by mouth daily.    Dispense:  90 tablet    Refill:  3   molnupiravir EUA (LAGEVRIO) 200 mg CAPS capsule    Sig: Take 4 capsules (800 mg total) by mouth 2 (two) times daily for 5 days.    Dispense:  40 capsule    Refill:  0  budesonide-formoterol (SYMBICORT) 80-4.5 MCG/ACT inhaler    Sig: Inhale 2  puffs into the lungs 2 (two) times daily.    Dispense:  1 each    Refill:  3      Follow-up: Return in about 6 months (around 11/22/2022) for a follow-up visit.  Walker Kehr, MD

## 2022-05-24 NOTE — Addendum Note (Signed)
Addended by: Cassandria Anger on: 05/24/2022 07:39 AM   Modules accepted: Orders

## 2022-05-24 NOTE — Assessment & Plan Note (Signed)
Likely related to diabetes, hypertension, IV contrast exposure.  Hydrate well.  Will obtain renal ultrasound

## 2022-05-30 ENCOUNTER — Encounter: Payer: Self-pay | Admitting: Internal Medicine

## 2022-06-05 ENCOUNTER — Ambulatory Visit
Admission: RE | Admit: 2022-06-05 | Discharge: 2022-06-05 | Disposition: A | Discharge status: Home / Self Care | Payer: Medicare Other | Source: Ambulatory Visit | Attending: Internal Medicine | Admitting: Internal Medicine

## 2022-06-05 DIAGNOSIS — R944 Abnormal results of kidney function studies: Secondary | ICD-10-CM

## 2022-06-05 DIAGNOSIS — N183 Chronic kidney disease, stage 3 unspecified: Secondary | ICD-10-CM

## 2022-07-13 ENCOUNTER — Ambulatory Visit: Payer: Medicare Other

## 2022-07-13 VITALS — Ht 60.0 in | Wt 215.0 lb

## 2022-07-13 DIAGNOSIS — Z0000 Encounter for general adult medical examination without abnormal findings: Secondary | ICD-10-CM | POA: Diagnosis not present

## 2022-07-13 NOTE — Progress Notes (Cosign Needed Addendum)
Virtual Visit via Telephone Note  I connected with  Sierra Johnson on 07/13/22 at 10:00 AM EST by telephone and verified that I am speaking with the correct person using two identifiers.  Location: Patient: Home Provider: Rutherford Johnson participating in the virtual visit: Sierra Johnson   I discussed the limitations, risks, security and privacy concerns of performing an evaluation and management service by telephone and the availability of in person appointments. The patient expressed understanding and agreed to proceed.  Interactive audio and video telecommunications were attempted between this nurse and patient, however failed, due to patient having technical difficulties OR patient did not have access to video capability.  We continued and completed visit with audio only.  Some vital signs may be absent or patient reported.   Sierra Flow, LPN  Subjective:   Sierra Johnson is a 72 y.o. female who presents for Medicare Annual (Subsequent) preventive examination.  Review of Systems     Cardiac Risk Factors include: advanced age (>64mn, >>56women);diabetes mellitus;dyslipidemia;family history of premature cardiovascular disease;hypertension     Objective:    Today's Vitals   07/13/22 1002  Weight: 215 lb (97.5 kg)  Height: 5' (1.524 m)  PainSc: 0-No pain   Body mass index is 41.99 kg/m.     07/13/2022   10:05 AM 08/15/2021   12:15 PM 08/03/2021   10:08 AM 07/04/2021    2:31 PM 05/31/2020    2:06 PM 05/29/2019   10:11 AM 01/27/2019   11:19 AM  Advanced Directives  Does Patient Have a Medical Advance Directive? No No No No No No No  Would patient like information on creating a medical advance directive? No - Patient declined No - Patient declined Yes (MAU/Ambulatory/Procedural Areas - Information given) No - Patient declined No - Patient declined No - Patient declined No - Patient declined    Current Medications (verified) Outpatient  Encounter Medications as of 07/13/2022  Medication Sig   Accu-Chek FastClix Lancets MISC USE TO CHECK BLOOD SUGARS TWICE A DAY   apixaban (ELIQUIS) 2.5 MG TABS tablet Take 1 tablet (2.5 mg total) by mouth 2 (two) times daily.   Blood Glucose Monitoring Suppl (ACCU-CHEK GUIDE) w/Device KIT 1 each by Does not apply route 2 (two) times daily. DX: E11.9   budesonide-formoterol (SYMBICORT) 80-4.5 MCG/ACT inhaler Inhale 2 puffs into the lungs 2 (two) times daily.   chlorpheniramine (CHLOR-TRIMETON) 4 MG tablet Take 4 mg by mouth 3 (three) times daily.   gabapentin (NEURONTIN) 300 MG capsule Take a 300 mg capsule three times a day for two weeks following surgery.Then take a 300 mg capsule two times a day for two weeks. Then take a 300 mg capsule once a day for two weeks. Then discontinue.   glucose blood (ACCU-CHEK GUIDE) test strip UUSE TO CHECK BLOOD SUGARS TWICE A DAY   Melatonin 10 MG CAPS Take 10 mg by mouth at bedtime as needed (sleep).   metFORMIN (GLUCOPHAGE) 500 MG tablet TAKE 1 TABLET 2 TIMES DAILY WITH A MEAL. ANNUAL APPT IS DUE W/LABS MUST SEE PROVIDER REFILLS   methocarbamol (ROBAXIN) 500 MG tablet Take 1 tablet (500 mg total) by mouth every 6 (six) hours as needed for muscle spasms.   olmesartan (BENICAR) 20 MG tablet Take 1 tablet (20 mg total) by mouth daily. Follow-up appt w/ LABS [place]   ondansetron (ZOFRAN) 4 MG tablet Take 4 mg by mouth every 6 (six) hours as  needed for nausea/vomiting.   oxyCODONE (OXY IR/ROXICODONE) 5 MG immediate release tablet Take 1-2 tablets (5-10 mg total) by mouth every 6 (six) hours as needed for severe pain.   pravastatin (PRAVACHOL) 20 MG tablet Take 1 tablet (20 mg total) by mouth daily.   traMADol (ULTRAM) 50 MG tablet Take 1-2 tablets (50-100 mg total) by mouth every 6 (six) hours as needed for moderate pain.   No facility-administered encounter medications on file as of 07/13/2022.    Allergies  (verified) Benazepril, Amlodipine, and Tetracycline   History: Past Medical History:  Diagnosis Date   ANEMIA-NOS 04/24/2007   Arthritis    Blood clot in vein    Cough    DIABETES MELLITUS, TYPE II 04/24/2007   Elevated glucose 2011   HYPERLIPIDEMIA 07/17/2007   mild   HYPERTENSION 04/24/2007   Knee pain    Menopause 1998   Dr. Gaetano Johnson   OBSTRUCTIVE SLEEP APNEA 09/30/2009   on CPAP 2011   Pneumonia    chronic Brochitis and use inhaler   VITAMIN B12 DEFICIENCY 04/24/2007   Past Surgical History:  Procedure Laterality Date   ABDOMINAL HYSTERECTOMY     HERNIA REPAIR  04/2010   Incar. midline hernia   INCISIONAL HERNIA REPAIR N/A 06/03/2014   Procedure: LAPAROSCOPIC REPAIR RECURRENT  INCISIONAL HERNIA;  Surgeon: Sierra Skeans, MD;  Location: [facility];  Service: General;  Laterality: N/A;   INSERTION OF MESH N/A 06/03/2014   Procedure: INSERTION OF MESH;  Surgeon: Sierra Skeans, MD;  Location: [facility];  Service: General;  Laterality: N/A;   TONSILLECTOMY     TOTAL KNEE ARTHROPLASTY Right 01/27/2019   Procedure: TOTAL KNEE ARTHROPLASTY;  Surgeon: Sierra Arabian, MD;  Location: [facility];  Service: Orthopedics;  Laterality: Right;  44mn   TOTAL KNEE ARTHROPLASTY Left 08/15/2021   Procedure: TOTAL KNEE ARTHROPLASTY;  Surgeon: AGaynelle Arabian MD;  Location: [facility];  Service: Orthopedics;  Laterality: Left;   WISDOM TOOTH EXTRACTION     Family History  Problem Relation Age of Onset   Heart disease Mother 826      ?CAD   Lung cancer Father    Heart disease Brother 554      CAD   Hypertension Other    Colon cancer Neg Hx    Esophageal cancer Neg Hx    Stomach cancer Neg Hx    Pancreatic cancer Neg Hx    Liver disease Neg Hx    Social History   Socioeconomic History   Marital status: Married    Spouse name: Sierra Johnson  Number of children: 2   Years of education: Not on file   Highest education level: Not on file  Occupational History   Occupation: AAeronautical engineer [employer]  Tobacco Use   Smoking status: Never    Passive exposure: Never   Smokeless tobacco: Never  Vaping Use   Vaping Use: Never used  Substance and Sexual Activity   Alcohol use: No   Drug use: No   Sexual activity: Yes    Birth control/protection: Surgical  Other Topics Concern   Not on file  Social History Narrative   Vegan   Social Determinants of Health   Financial Resource Strain: Low Risk  (07/13/2022)   Overall Financial Resource Strain (CARDIA)    Difficulty of Paying Living Expenses: Not hard at all  Food Insecurity: No Food Insecurity (07/13/2022)   Hunger Vital Sign    Worried About Running Out of Food  in the Last Year: Never true    [place] in the Last Year: Never true  Transportation Needs: No Transportation Needs (07/13/2022)   PRAPARE - Hydrologist (Medical): No    Lack of Transportation (Non-Medical): No  Physical Activity: Sufficiently Active (07/13/2022)   Exercise Vital Sign    Days of Exercise per Week: 5 days    Minutes of Exercise per Session: 30 min  Stress: No Stress Concern Present (07/13/2022)   [place]    Feeling of Stress : Not at all  Social Connections: [place] (07/13/2022)   Social Connection and Isolation Panel [NHANES]    Frequency of Communication with Friends and Family: More than three times a week    Frequency of Social Gatherings with Friends and Family: More than three times a week    Attends Religious Services: More than 4 times per year    Active Member of [employer] or Organizations: Yes    Attends Music therapist: More than 4 times per year    Marital Status: Married    Tobacco Counseling Counseling given: Not Answered   Clinical Intake:  Pre-visit preparation completed: Yes  Pain : No/denies pain Pain Score: 0-No pain     BMI - recorded: 41.99 Nutritional Status: BMI > 30   Obese Nutritional Risks: None Diabetes: No  How often do you need to have someone help you when you read instructions, pamphlets, or other written materials from your doctor or pharmacy?: 1 - Never What is the last grade level you completed in school?: Accountant  Nutrition Risk Assessment:  Has the patient had any N/V/D within the last 2 months?  No  Does the patient have any non-healing wounds?  No  Has the patient had any unintentional weight loss or weight gain?  No   Diabetes:  Is the patient diabetic?  Yes  If diabetic, was a CBG obtained today?  No  Did the patient bring in their glucometer from home?  No  How often do you monitor your CBG's? Twice daily.   Financial Strains and Diabetes Management:  Are you having any financial strains with the device, your supplies or your medication? No .  Does the patient want to be seen by Chronic Care Management for management of their diabetes?  No  Would the patient like to be referred to a Nutritionist or for Diabetic Management?  No   Diabetic Exams:  Diabetic Eye Exam: Completed 05/23/2022 Diabetic Foot Exam: Overdue, Pt has been advised about the importance in completing this exam. Pt is scheduled for diabetic foot exam on or at next appointment with pcp.   Interpreter Needed?: No  Information entered by :: Lisette Abu, LPN.   Activities of Daily Living    07/13/2022   10:08 AM 08/15/2021   12:17 PM  In your present state of health, do you have any difficulty performing the following activities:  Hearing? 0   Vision? 0   Difficulty concentrating or making decisions? 0   Walking or climbing stairs? 0   Dressing or bathing? 0   Doing errands, shopping? 0 0  Preparing Food and eating ? N   Using the Toilet? N   In the past six months, have you accidently leaked urine? N   Do you have problems with loss of bowel control? N   Managing your Medications? N   Managing your Finances? N  Housekeeping or managing your  Housekeeping? N     Patient Care Team: Plotnikov, [name], MD as PCP - Gaston Islam, MD as Consulting Physician (Orthopedic Surgery) [name], MD as Consulting Physician (Pulmonary Disease) Laurence Aly, OD (Optometry)  Indicate any recent Medical Services you may have received from other than [facility] providers in the past year (date may be approximate).     Assessment:   This is a routine wellness examination for [place].  Hearing/Vision screen Hearing Screening - Comments:: Denies hearing difficulties   Vision Screening - Comments:: Wears rx glasses - up to date with routine eye exams with Laurence Aly, OD. [facility]   Dietary issues and exercise activities discussed: Current Exercise Habits: Home exercise routine;Structured exercise class, Type of exercise: walking;Other - see comments (water aerobics, bike riding), Time (Minutes): 30, Frequency (Times/Week): 5, Weekly Exercise (Minutes/Week): 150, Intensity: Moderate   Goals Addressed             This Visit's Progress    Client will verbalize knowledge of diabetes self-management as evidenced by Hgb A1C <7 or as defined by provider.            Depression Screen    07/13/2022   10:06 AM 05/23/2022    9:15 AM 07/04/2021    2:36 PM 05/20/2021    9:28 AM 05/31/2020    2:26 PM 12/31/2019   10:26 AM 03/27/2019    3:36 PM  PHQ 2/9 Scores  PHQ - 2 Score 0 0 0 0 0 1 0  PHQ- 9 Score 0 '3  4  3     '$ Fall Risk    07/13/2022   10:06 AM 05/23/2022    9:15 AM 07/04/2021    2:32 PM 05/20/2021    9:28 AM 05/31/2020    2:07 PM  [place] in the past year? 0 0 0 0 0  Number falls in past yr: 0 0 0 0 0  Injury with Fall? 0 0 0 0 0  Risk for fall due to : No Fall Risks No Fall Risks No Fall Risks No Fall Risks No Fall Risks  Follow up Falls prevention discussed Falls evaluation completed   Falls evaluation completed    [place]:  Any stairs in or around the home?  No  If so, are there any without handrails? No  Home free of loose throw rugs in walkways, pet beds, electrical cords, etc? Yes  Adequate lighting in your home to reduce risk of falls? Yes   ASSISTIVE DEVICES UTILIZED TO PREVENT FALLS:  Life alert? No  Use of a cane, walker or w/c? No  Grab bars in the bathroom? No  Shower chair or bench in shower? No  Elevated toilet seat or a handicapped toilet? No   TIMED UP AND GO: Phone Visit  Was the test performed? No .   Cognitive Function:        07/13/2022   10:09 AM  6CIT Screen  What Year? 0 points  What month? 0 points  What time? 0 points  Count back from 20 0 points  Months in reverse 0 points  Repeat phrase 0 points  Total Score 0 points    Immunizations Immunization History  Administered Date(s) Administered   COVID-19, mRNA, vaccine(Comirnaty)12 years and older 03/12/2022   Fluad Quad(high Dose 65+) 03/22/2019, 05/10/2020   Influenza, High Dose Seasonal PF 08/19/2018, 03/01/2021   Influenza-Unspecified 04/02/2017, 03/19/2022  PFIZER(Purple Top)SARS-COV-2 Vaccination 07/24/2019, 08/14/2019, 03/22/2020, 12/09/2020, 03/08/2021   PNEUMOCOCCAL CONJUGATE-20 06/08/2021   Pfizer Covid-19 Vaccine Bivalent Booster 36yr & up 03/08/2021   Pneumococcal Conjugate-13 05/07/2019   RSV,unspecified 03/19/2022   Tdap 05/07/2019   Zoster Recombinat (Shingrix) 06/05/2017, 08/10/2017    TDAP status: Up to date  Flu Vaccine status: Up to date  Pneumococcal vaccine status: Up to date  Covid-19 vaccine status: Completed vaccines  Qualifies for Shingles Vaccine? Yes   Zostavax completed No   Shingrix Completed?: Yes  Screening Tests Health Maintenance  Topic Date Due   FOOT EXAM  01/08/2014   DEXA SCAN  Never done   HEMOGLOBIN A1C  11/22/2022   Diabetic kidney evaluation - eGFR measurement  05/24/2023   Diabetic kidney evaluation - Urine ACR  05/24/2023   OPHTHALMOLOGY EXAM  05/24/2023   Medicare Annual Wellness (AWV)   07/14/2023   MAMMOGRAM  12/07/2023   COLONOSCOPY (Pts 45-460yrInsurance coverage will need to be confirmed)  11/17/2028   DTaP/Tdap/Td (2 - Td or Tdap) 05/06/2029   Pneumonia Vaccine 6522Years old  Completed   COVID-19 Vaccine  Completed   Hepatitis C Screening  Completed   Zoster Vaccines- Shingrix  Completed   HPV VACCINES  Aged Out   INFLUENZA VACCINE  Discontinued    Health Maintenance  Health Maintenance Due  Topic Date Due   FOOT EXAM  01/08/2014   DEXA SCAN  Never done    Colorectal cancer screening: Type of screening: Colonoscopy. Completed 11/18/2018. Repeat every 10 years  Mammogram status: Completed 12/06/2021. Repeat every year  Bone Density status: Never done  Lung Cancer Screening: (Low Dose CT Chest recommended if Age 70-80ears, 30 pack-year currently smoking OR have quit w/in 15years.) does not qualify.   Lung Cancer Screening Referral: no  Additional Screening:  Hepatitis C Screening: does qualify; Completed 04/13/2016  Vision Screening: Recommended annual ophthalmology exams for early detection of glaucoma and other disorders of the eye. Is the patient up to date with their annual eye exam?  Yes  Who is the provider or what is the name of the office in which the patient attends annual eye exams? [facility] provider, would they like to be referred to a provider to establish care? No .   Dental Screening: Recommended annual dental exams for proper oral hygiene  Community Resource Referral / Chronic Care Management: CRR required this visit?  No   CCM required this visit?  No      Plan:     I have personally reviewed and noted the following in the patient's chart:   Medical and social history Use of alcohol, tobacco or illicit drugs  Current medications and supplements including opioid prescriptions. Patient is currently taking opioid prescriptions. Information provided to patient  regarding non-opioid alternatives. Patient advised to discuss non-opioid treatment plan with their provider. Functional ability and status Nutritional status Physical activity Advanced directives List of other physicians Hospitalizations, surgeries, and ER visits in previous 12 months Vitals Screenings to include cognitive, depression, and falls Referrals and appointments  In addition, I have reviewed and discussed with patient certain preventive protocols, quality metrics, and best practice recommendations. A written personalized care plan for preventive services as well as general preventive health recommendations were provided to patient.     ShSheral FlowLPN   06/27/14/3846 Nurse Notes: n/a   Medical screening examination/treatment/procedure(s) were performed by non-physician  practitioner and as supervising physician I was immediately available for consultation/collaboration.  I agree with above. Lew Dawes, MD

## 2022-07-13 NOTE — Patient Instructions (Signed)
[name] , Thank you for taking time to come for your Medicare Wellness Visit. I appreciate your ongoing commitment to your health goals. Please review the following plan we discussed and let me know if I can assist you in the future.   These are the goals we discussed:  Goals       Client will verbalize knowledge of diabetes self-management as evidenced by Hgb A1C <7 or as defined by provider.           patient declined program services at this time (pt-stated)      Care Coordination Interventions:   Active listening / Reflection utilized  Informed client about Care Coordination program services Patient declined program services at this time         This is a list of the screening recommended for you and due dates:  Health Maintenance  Topic Date Due   Complete foot exam   01/08/2014   DEXA scan (bone density measurement)  Never done   Medicare Annual Wellness Visit  07/04/2022   Hemoglobin A1C  11/22/2022   Yearly kidney function blood test for diabetes  05/24/2023   Yearly kidney health urinalysis for diabetes  05/24/2023   Eye exam for diabetics  05/24/2023   Mammogram  12/07/2023   Colon Cancer Screening  11/17/2028   DTaP/Tdap/Td vaccine (2 - Td or Tdap) 05/06/2029   Pneumonia Vaccine  Completed   COVID-19 Vaccine  Completed   Hepatitis C Screening: USPSTF Recommendation to screen - Ages 69-79 yo.  Completed   Zoster (Shingles) Vaccine  Completed   HPV Vaccine  Aged Out   Flu Shot  Discontinued    Advanced directives: No  Conditions/risks identified: Yes  Next appointment: Follow up in one year for your annual wellness visit.   Preventive Care 45 Years and Older, Female Preventive care refers to lifestyle choices and visits with your health care provider that can promote health and wellness. What does preventive care include? A yearly physical exam. This is also called an annual well check. Dental exams once or twice a year. Routine eye exams. Ask your  health care provider how often you should have your eyes checked. Personal lifestyle choices, including: Daily care of your teeth and gums. Regular physical activity. Eating a healthy diet. Avoiding tobacco and drug use. Limiting alcohol use. Practicing safe sex. Taking low-dose aspirin every day. Taking vitamin and mineral supplements as recommended by your health care provider. What happens during an annual well check? The services and screenings done by your health care provider during your annual well check will depend on your age, overall health, lifestyle risk factors, and family history of disease. Counseling  Your health care provider may ask you questions about your: Alcohol use. Tobacco use. Drug use. Emotional well-being. Home and relationship well-being. Sexual activity. Eating habits. History of falls. Memory and ability to understand (cognition). Work and work Statistician. Reproductive health. Screening  You may have the following tests or measurements: Height, weight, and BMI. Blood pressure. Lipid and cholesterol levels. These may be checked every 5 years, or more frequently if you are over 15 years old. Skin check. Lung cancer screening. You may have this screening every year starting at age 29 if you have a 30-pack-year history of smoking and currently smoke or have quit within the past 15 years. Fecal occult blood test (FOBT) of the stool. You may have this test every year starting at age 58. Flexible sigmoidoscopy or colonoscopy. You may have a  sigmoidoscopy every 5 years or a colonoscopy every 10 years starting at age 67. Hepatitis C blood test. Hepatitis B blood test. Sexually transmitted disease (STD) testing. Diabetes screening. This is done by checking your blood sugar (glucose) after you have not eaten for a while (fasting). You may have this done every 1-3 years. Bone density scan. This is done to screen for osteoporosis. You may have this done  starting at age 68. Mammogram. This may be done every 1-2 years. Talk to your health care provider about how often you should have regular mammograms. Talk with your health care provider about your test results, treatment options, and if necessary, the need for more tests. Vaccines  Your health care provider may recommend certain vaccines, such as: Influenza vaccine. This is recommended every year. Tetanus, diphtheria, and acellular pertussis (Tdap, Td) vaccine. You may need a Td booster every 10 years. Zoster vaccine. You may need this after age 8. Pneumococcal 13-valent conjugate (PCV13) vaccine. One dose is recommended after age 33. Pneumococcal polysaccharide (PPSV23) vaccine. One dose is recommended after age 2. Talk to your health care provider about which screenings and vaccines you need and how often you need them. This information is not intended to replace advice given to you by your health care provider. Make sure you discuss any questions you have with your health care provider. Document Released: 07/02/2015 Document Revised: 02/23/2016 Document Reviewed: 04/06/2015 Elsevier Interactive Patient Education  2017 [place] Prevention in the Home Falls can cause injuries. They can happen to people of all ages. There are many things you can do to make your home safe and to help prevent falls. What can I do on the outside of my home? Regularly fix the edges of walkways and driveways and fix any cracks. Remove anything that might make you trip as you walk through a door, such as a raised step or threshold. Trim any bushes or trees on the path to your home. Use bright outdoor lighting. Clear any walking paths of anything that might make someone trip, such as rocks or tools. Regularly check to see if handrails are loose or broken. Make sure that both sides of any steps have handrails. Any raised decks and porches should have guardrails on the edges. Have any leaves, snow, or  ice cleared regularly. Use sand or salt on walking paths during winter. Clean up any spills in your garage right away. This includes oil or grease spills. What can I do in the bathroom? Use night lights. Install grab bars by the toilet and in the tub and shower. Do not use towel bars as grab bars. Use non-skid mats or decals in the tub or shower. If you need to sit down in the shower, use a plastic, non-slip stool. Keep the floor dry. Clean up any water that spills on the floor as soon as it happens. Remove soap buildup in the tub or shower regularly. Attach bath mats securely with double-sided non-slip rug tape. Do not have throw rugs and other things on the floor that can make you trip. What can I do in the bedroom? Use night lights. Make sure that you have a light by your bed that is easy to reach. Do not use any sheets or blankets that are too big for your bed. They should not hang down onto the floor. Have a firm chair that has side arms. You can use this for support while you get dressed. Do not have throw rugs and other  things on the floor that can make you trip. What can I do in the kitchen? Clean up any spills right away. Avoid walking on wet floors. Keep items that you use a lot in easy-to-reach places. If you need to reach something above you, use a strong step stool that has a grab bar. Keep electrical cords out of the way. Do not use floor polish or wax that makes floors slippery. If you must use wax, use non-skid floor wax. Do not have throw rugs and other things on the floor that can make you trip. What can I do with my stairs? Do not leave any items on the stairs. Make sure that there are handrails on both sides of the stairs and use them. Fix handrails that are broken or loose. Make sure that handrails are as long as the stairways. Check any carpeting to make sure that it is firmly attached to the stairs. Fix any carpet that is loose or worn. Avoid having throw rugs at  the top or bottom of the stairs. If you do have throw rugs, attach them to the floor with carpet tape. Make sure that you have a light switch at the top of the stairs and the bottom of the stairs. If you do not have them, ask someone to add them for you. What else can I do to help prevent falls? Wear shoes that: Do not have high heels. Have rubber bottoms. Are comfortable and fit you well. Are closed at the toe. Do not wear sandals. If you use a stepladder: Make sure that it is fully opened. Do not climb a closed stepladder. Make sure that both sides of the stepladder are locked into place. Ask someone to hold it for you, if possible. Clearly mark and make sure that you can see: Any grab bars or handrails. First and last steps. Where the edge of each step is. Use tools that help you move around (mobility aids) if they are needed. These include: Canes. Walkers. Scooters. Crutches. Turn on the lights when you go into a dark area. Replace any light bulbs as soon as they burn out. Set up your furniture so you have a clear path. Avoid moving your furniture around. If any of your floors are uneven, fix them. If there are any pets around you, be aware of where they are. Review your medicines with your doctor. Some medicines can make you feel dizzy. This can increase your chance of falling. Ask your doctor what other things that you can do to help prevent falls. This information is not intended to replace advice given to you by your health care provider. Make sure you discuss any questions you have with your health care provider. Document Released: 04/01/2009 Document Revised: 11/11/2015 Document Reviewed: 07/10/2014 Elsevier Interactive Patient Education  2017 [employer].

## 2022-08-25 ENCOUNTER — Ambulatory Visit: Payer: Medicare Other | Admitting: Internal Medicine

## 2022-08-29 ENCOUNTER — Ambulatory Visit (INDEPENDENT_AMBULATORY_CARE_PROVIDER_SITE_OTHER): Payer: Medicare Other | Admitting: Internal Medicine

## 2022-08-29 ENCOUNTER — Encounter: Payer: Self-pay | Admitting: Internal Medicine

## 2022-08-29 VITALS — BP 110/70 | HR 60 | Temp 98.6°F | Ht 60.0 in | Wt 216.0 lb

## 2022-08-29 DIAGNOSIS — J019 Acute sinusitis, unspecified: Secondary | ICD-10-CM | POA: Diagnosis not present

## 2022-08-29 DIAGNOSIS — E1169 Type 2 diabetes mellitus with other specified complication: Secondary | ICD-10-CM

## 2022-08-29 DIAGNOSIS — E669 Obesity, unspecified: Secondary | ICD-10-CM

## 2022-08-29 LAB — COMPREHENSIVE METABOLIC PANEL
ALT: 17 U/L (ref 0–35)
AST: 19 U/L (ref 0–37)
Albumin: 3.9 g/dL (ref 3.5–5.2)
Alkaline Phosphatase: 93 U/L (ref 39–117)
BUN: 11 mg/dL (ref 6–23)
CO2: 23 mEq/L (ref 19–32)
Calcium: 9.7 mg/dL (ref 8.4–10.5)
Chloride: 108 mEq/L (ref 96–112)
Creatinine, Ser: 0.91 mg/dL (ref 0.40–1.20)
GFR: 64.48 mL/min (ref >60.00)
Glucose, Bld: 82 mg/dL (ref 70–99)
Potassium: 3.9 mEq/L (ref 3.5–5.1)
Sodium: 138 mEq/L (ref 135–145)
Total Bilirubin: 0.3 mg/dL (ref 0.2–1.2)
Total Protein: 7.4 g/dL (ref 6.0–8.3)

## 2022-08-29 LAB — HEMOGLOBIN A1C: Hgb A1c MFr Bld: 6.7 % — ABNORMAL HIGH (ref 4.6–6.5)

## 2022-08-29 MED ORDER — MONTELUKAST SODIUM 10 MG PO TABS: 10.0000 mg | ORAL_TABLET | Freq: Every day | ORAL | 3 refills | Status: DC

## 2022-08-29 MED ORDER — FLUTICASONE PROPIONATE 50 MCG/ACT NA SUSP: 2.0000 | Freq: Every day | NASAL | 2 refills | Status: DC

## 2022-08-29 MED ORDER — AZITHROMYCIN 250 MG PO TABS: ORAL_TABLET | ORAL | 0 refills | Status: DC

## 2022-08-29 MED ORDER — OXYMETAZOLINE HCL 0.05 % NA SOLN: 1.0000 | Freq: Two times a day (BID) | NASAL | 1 refills | Status: AC | PRN

## 2022-08-29 NOTE — Progress Notes (Signed)
Subjective:  Patient ID: Sierra Johnson, female    DOB: Feb 21, 1951  Age: 72 y.o. MRN: [id number]  CC: Allergies (Diabetic medication and Asthma Medication discussion) and Cough   HPI CRICKETT STOCKSDALE presents for allergies - worse, DM, h/o PE C/o URI sx's  Outpatient Medications Prior to Visit  Medication Sig Dispense Refill   Accu-Chek FastClix Lancets MISC USE TO CHECK BLOOD SUGARS TWICE A DAY 102 each 0   albuterol (VENTOLIN HFA) 108 (90 Base) MCG/ACT inhaler Inhale 2 puffs into the lungs every 6 (six) hours as needed for wheezing or shortness of breath.     apixaban (ELIQUIS) 2.5 MG TABS tablet Take 1 tablet (2.5 mg total) by mouth 2 (two) times daily. 60 tablet 3   Blood Glucose Monitoring Suppl (ACCU-CHEK GUIDE) w/Device KIT 1 each by Does not apply route 2 (two) times daily. DX: E11.9 1 kit 0   budesonide-formoterol (SYMBICORT) 80-4.5 MCG/ACT inhaler Inhale 2 puffs into the lungs 2 (two) times daily. 1 each 3   chlorpheniramine (CHLOR-TRIMETON) 4 MG tablet Take 4 mg by mouth 3 (three) times daily.     Cholecalciferol 25 MCG (1000 UT) capsule 1,000 Units.     cyanocobalamin (VITAMIN B12) 500 MCG tablet 500 mcg.     Docusate Sodium (DSS) 100 MG CAPS      esomeprazole (NEXIUM) 40 MG capsule Take by mouth.     fluticasone furoate-vilanterol (BREO ELLIPTA) 100-25 MCG/ACT AEPB Inhale into the lungs.     gabapentin (NEURONTIN) 300 MG capsule Take a 300 mg capsule three times a day for two weeks following surgery.Then take a 300 mg capsule two times a day for two weeks. Then take a 300 mg capsule once a day for two weeks. Then discontinue. 84 capsule 0   glucose blood (ACCU-CHEK GUIDE) test strip UUSE TO CHECK BLOOD SUGARS TWICE A DAY 200 strip 1   insulin glargine, 1 Unit Dial, (TOUJEO SOLOSTAR) 300 UNIT/ML Solostar Pen      Melatonin 10 MG CAPS Take 10 mg by mouth at bedtime as needed (sleep).     metFORMIN (GLUCOPHAGE) 500 MG tablet TAKE 1 TABLET 2 TIMES DAILY WITH A MEAL. ANNUAL APPT  IS DUE W/LABS MUST SEE PROVIDER REFILLS 180 tablet 3   methocarbamol (ROBAXIN) 500 MG tablet Take 1 tablet (500 mg total) by mouth every 6 (six) hours as needed for muscle spasms. 40 tablet 0   olmesartan (BENICAR) 20 MG tablet Take 1 tablet (20 mg total) by mouth daily. Follow-up appt w/ LABS due in JANUARY must see provider for future refills 90 tablet 3   ondansetron (ZOFRAN) 4 MG tablet Take 4 mg by mouth every 6 (six) hours as needed for nausea/vomiting.     oxyCODONE (OXY IR/ROXICODONE) 5 MG immediate release tablet Take 1-2 tablets (5-10 mg total) by mouth every 6 (six) hours as needed for severe pain. 42 tablet 0   pravastatin (PRAVACHOL) 20 MG tablet Take 1 tablet (20 mg total) by mouth daily. 90 tablet 3   traMADol (ULTRAM) 50 MG tablet Take 1-2 tablets (50-100 mg total) by mouth every 6 (six) hours as needed for moderate pain. 40 tablet 0   fluticasone (FLONASE) 50 MCG/ACT nasal spray 1 spray 2 (two) times a day.     No facility-administered medications prior to visit.    ROS: Review of Systems  Constitutional:  Negative for activity change, appetite change, chills, fatigue and unexpected weight change.  HENT:  Positive for congestion. Negative for mouth  sores and sinus pressure.   Eyes:  Negative for visual disturbance.  Respiratory:  Positive for cough. Negative for chest tightness.   Cardiovascular:  Negative for chest pain.  Gastrointestinal:  Negative for abdominal pain and nausea.  Genitourinary:  Negative for difficulty urinating, frequency and vaginal pain.  Musculoskeletal:  Negative for back pain and gait problem.  Skin:  Negative for pallor and rash.  Neurological:  Negative for dizziness, tremors, weakness, numbness and headaches.  Psychiatric/Behavioral:  Negative for confusion and sleep disturbance.     Objective:  BP 110/70 (BP Location: Left Arm, Patient Position: Sitting, Cuff Size: Normal)   Pulse 60   Temp 98.6 F (37 C) (Oral)   Ht 5' (1.524 m)   Wt 216  lb (98 kg)   SpO2 97%   BMI 42.18 kg/m   BP Readings from Last 3 Encounters:  08/29/22 110/70  05/23/22 122/78  03/15/22 128/70    Wt Readings from Last 3 Encounters:  08/29/22 216 lb (98 kg)  07/13/22 215 lb (97.5 kg)  05/23/22 203 lb (92.1 kg)    Physical Exam Constitutional:      General: She is not in acute distress.    Appearance: She is well-developed. She is obese.  HENT:     Head: Normocephalic.     Right Ear: External ear normal.     Left Ear: External ear normal.     Nose: Nose normal.  Eyes:     General:        Right eye: No discharge.        Left eye: No discharge.     Conjunctiva/sclera: Conjunctivae normal.     Pupils: Pupils are equal, round, and reactive to light.  Neck:     Thyroid: No thyromegaly.     Vascular: No JVD.     Trachea: No tracheal deviation.  Cardiovascular:     Rate and Rhythm: Normal rate and regular rhythm.     Heart sounds: Normal heart sounds.  Pulmonary:     Effort: No respiratory distress.     Breath sounds: No stridor. No wheezing.  Abdominal:     General: Bowel sounds are normal. There is no distension.     Palpations: Abdomen is soft. There is no mass.     Tenderness: There is no abdominal tenderness. There is no guarding or rebound.  Musculoskeletal:        General: No tenderness.     Cervical back: Normal range of motion and neck supple. No rigidity.  Lymphadenopathy:     Cervical: No cervical adenopathy.  Skin:    Findings: No erythema or rash.  Neurological:     Cranial Nerves: No cranial nerve deficit.     Motor: No abnormal muscle tone.     Coordination: Coordination normal.     Deep Tendon Reflexes: Reflexes normal.  Psychiatric:        Behavior: Behavior normal.        Thought Content: Thought content normal.        Judgment: Judgment normal.     Lab Results  Component Value Date   WBC 7.6 05/23/2022   HGB 12.0 05/23/2022   HCT 37.2 05/23/2022   PLT 293.0 05/23/2022   GLUCOSE 82 08/29/2022   CHOL  164 05/23/2022   TRIG 61.0 05/23/2022   HDL 76.90 05/23/2022   LDLDIRECT 119.5 08/27/2012   LDLCALC 75 05/23/2022   ALT 17 08/29/2022   AST 19 08/29/2022   NA 138 08/29/2022  K 3.9 08/29/2022   CL 108 08/29/2022   CREATININE 0.91 08/29/2022   BUN 11 08/29/2022   CO2 23 08/29/2022   TSH 1.74 05/23/2022   INR 1.1 08/03/2021   HGBA1C 6.7 (H) 08/29/2022   MICROALBUR 5.2 (H) 05/23/2022    US RENAL  Result Date: 06/05/2022 CLINICAL DATA:  Decreased GFR. EXAM: RENAL / URINARY TRACT ULTRASOUND COMPLETE COMPARISON:  None Available. FINDINGS: Right Kidney: Renal measurements: 9.2 x 4 x 4.7 cm = volume: 89 mL. Echogenicity within normal limits. No mass or hydronephrosis visualized. Left Kidney: Renal measurements: 11.5 x 4 x 4.5 cm = volume: 108 mL. Portions of the kidney are not well seen. Echogenicity within normal limits, where seen. 3.4 cm cyst, as also shown on CT abdomen of 02/27/2018. No suspicious mass or hydronephrosis visualized. Bladder: Appears normal for degree of bladder distention. Other: None. IMPRESSION: No acute findings. No hydronephrosis. Electronically Signed   By: Bary Richard M.D.   On: 06/05/2022 16:01    Assessment & Plan:   Problem List Items Addressed This Visit     Sinusitis, acute    Z-Pak prescribed      Relevant Medications   oxymetazoline (AFRIN NASAL SPRAY) 0.05 % nasal spray   azithromycin (ZITHROMAX Z-PAK) 250 MG tablet   Obesity, Class III, BMI 40-49.9 (morbid obesity) (HCC) (Chronic)    On Metformin Consider Mounjaro      Relevant Medications   insulin glargine, 1 Unit Dial, (TOUJEO SOLOSTAR) 300 UNIT/ML Solostar Pen   Diabetes mellitus type 2 in obese - Primary    On Metformin, Toujeo Consider Mounjaro      Relevant Medications   insulin glargine, 1 Unit Dial, (TOUJEO SOLOSTAR) 300 UNIT/ML Solostar Pen   Other Relevant Orders   Comprehensive metabolic panel (Completed)   Hemoglobin A1c (Completed)      Meds ordered this encounter   Medications   DISCONTD: fluticasone (FLONASE) 50 MCG/ACT nasal spray    Sig: Place 2 sprays into both nostrils daily.    Dispense:  16 g    Refill:  2   oxymetazoline (AFRIN NASAL SPRAY) 0.05 % nasal spray    Sig: Place 1 spray into both nostrils 2 (two) times daily as needed for congestion.    Dispense:  30 mL    Refill:  1   DISCONTD: montelukast (SINGULAIR) 10 MG tablet    Sig: Take 1 tablet (10 mg total) by mouth daily.    Dispense:  30 tablet    Refill:  3   azithromycin (ZITHROMAX Z-PAK) 250 MG tablet    Sig: As directed    Dispense:  6 tablet    Refill:  0      Follow-up: No follow-ups on file.  Sonda Primes, MD

## 2022-08-29 NOTE — Patient Instructions (Signed)
Try Benadryl 5 mg 2-4 times a day for bad allergies

## 2022-08-29 NOTE — Assessment & Plan Note (Signed)
On Metformin Consider Mounjaro

## 2022-08-29 NOTE — Assessment & Plan Note (Signed)
On Metformin, Toujeo Consider [employer]

## 2022-09-05 ENCOUNTER — Encounter: Payer: Self-pay | Admitting: Internal Medicine

## 2022-09-06 ENCOUNTER — Other Ambulatory Visit: Payer: Self-pay | Admitting: Internal Medicine

## 2022-09-06 MED ORDER — TIRZEPATIDE 2.5 MG/0.5ML ~~LOC~~ SOAJ: 2.5000 mg | SUBCUTANEOUS | 3 refills | Status: DC

## 2022-09-13 ENCOUNTER — Ambulatory Visit: Payer: Medicare Other | Admitting: Internal Medicine

## 2022-09-22 ENCOUNTER — Other Ambulatory Visit: Payer: Self-pay | Admitting: Internal Medicine

## 2022-09-22 MED ORDER — TIRZEPATIDE 5 MG/0.5ML ~~LOC~~ SOAJ: 5.0000 mg | SUBCUTANEOUS | 2 refills | Status: DC

## 2022-09-25 ENCOUNTER — Other Ambulatory Visit: Payer: Self-pay | Admitting: Internal Medicine

## 2022-10-21 ENCOUNTER — Other Ambulatory Visit: Payer: Self-pay | Admitting: Hematology and Oncology

## 2022-10-22 ENCOUNTER — Encounter: Payer: Self-pay | Admitting: Internal Medicine

## 2022-10-22 NOTE — Assessment & Plan Note (Signed)
Z-Pak prescribed

## 2022-10-24 ENCOUNTER — Encounter: Payer: Self-pay | Admitting: Internal Medicine

## 2022-10-30 ENCOUNTER — Other Ambulatory Visit: Payer: Self-pay | Admitting: Internal Medicine

## 2022-10-30 MED ORDER — TIRZEPATIDE 7.5 MG/0.5ML ~~LOC~~ SOAJ: 7.5000 mg | SUBCUTANEOUS | 3 refills | Status: DC

## 2022-10-30 NOTE — Progress Notes (Signed)
Rx

## 2022-11-30 ENCOUNTER — Other Ambulatory Visit: Payer: Self-pay

## 2022-11-30 ENCOUNTER — Encounter: Payer: Self-pay | Admitting: Hematology and Oncology

## 2022-11-30 ENCOUNTER — Inpatient Hospital Stay: Payer: Medicare Other | Attending: Hematology and Oncology

## 2022-11-30 ENCOUNTER — Inpatient Hospital Stay: Payer: Medicare Other | Admitting: Hematology and Oncology

## 2022-11-30 VITALS — BP 156/91 | HR 68 | Temp 97.9°F | Resp 18 | Ht 60.0 in | Wt 206.9 lb

## 2022-11-30 DIAGNOSIS — I10 Essential (primary) hypertension: Secondary | ICD-10-CM

## 2022-11-30 DIAGNOSIS — N183 Chronic kidney disease, stage 3 unspecified: Secondary | ICD-10-CM

## 2022-11-30 DIAGNOSIS — Z86711 Personal history of pulmonary embolism: Secondary | ICD-10-CM | POA: Diagnosis not present

## 2022-11-30 DIAGNOSIS — Z86718 Personal history of other venous thrombosis and embolism: Secondary | ICD-10-CM | POA: Diagnosis not present

## 2022-11-30 DIAGNOSIS — Z79899 Other long term (current) drug therapy: Secondary | ICD-10-CM | POA: Diagnosis not present

## 2022-11-30 DIAGNOSIS — Z7901 Long term (current) use of anticoagulants: Secondary | ICD-10-CM | POA: Insufficient documentation

## 2022-11-30 DIAGNOSIS — D649 Anemia, unspecified: Secondary | ICD-10-CM

## 2022-11-30 DIAGNOSIS — E1169 Type 2 diabetes mellitus with other specified complication: Secondary | ICD-10-CM

## 2022-11-30 LAB — CBC WITH DIFFERENTIAL (CANCER CENTER ONLY)
Abs Immature Granulocytes: 0.02 10*3/uL (ref 0.00–0.07)
Basophils Absolute: 0 10*3/uL (ref 0.0–0.1)
Basophils Relative: 0 %
Eosinophils Absolute: 0.2 10*3/uL (ref 0.0–0.5)
Eosinophils Relative: 2 %
HCT: 38.8 % (ref 36.0–46.0)
Hemoglobin: 12.8 g/dL (ref 12.0–15.0)
Immature Granulocytes: 0 %
Lymphocytes Relative: 23 %
Lymphs Abs: 1.9 10*3/uL (ref 0.7–4.0)
MCH: 29 pg (ref 26.0–34.0)
MCHC: 33 g/dL (ref 30.0–36.0)
MCV: 88 fL (ref 80.0–100.0)
Monocytes Absolute: 0.9 10*3/uL (ref 0.1–1.0)
Monocytes Relative: 10 %
Neutro Abs: 5.5 10*3/uL (ref 1.7–7.7)
Neutrophils Relative %: 65 %
Platelet Count: 280 10*3/uL (ref 150–400)
RBC: 4.41 MIL/uL (ref 3.87–5.11)
RDW: 14.1 % (ref 11.5–15.5)
WBC Count: 8.5 10*3/uL (ref 4.0–10.5)
nRBC: 0 % (ref 0.0–0.2)

## 2022-11-30 LAB — COMPREHENSIVE METABOLIC PANEL
ALT: 14 U/L (ref 0–44)
AST: 19 U/L (ref 15–41)
Albumin: 3.8 g/dL (ref 3.5–5.0)
Alkaline Phosphatase: 87 U/L (ref 38–126)
Anion gap: 7 (ref 5–15)
BUN: 11 mg/dL (ref 8–23)
CO2: 26 mmol/L (ref 22–32)
Calcium: 9.8 mg/dL (ref 8.9–10.3)
Chloride: 102 mmol/L (ref 98–111)
Creatinine, Ser: 0.99 mg/dL (ref 0.44–1.00)
GFR, Estimated: >60 mL/min (ref >60)
Glucose, Bld: 100 mg/dL — ABNORMAL HIGH (ref 70–99)
Potassium: 4.1 mmol/L (ref 3.5–5.1)
Sodium: 135 mmol/L (ref 135–145)
Total Bilirubin: 0.4 mg/dL (ref 0.3–1.2)
Total Protein: 7.9 g/dL (ref 6.5–8.1)

## 2022-11-30 MED ORDER — APIXABAN 2.5 MG PO TABS: 2.5000 mg | ORAL_TABLET | Freq: Two times a day (BID) | ORAL | 11 refills | Status: DC

## 2022-11-30 NOTE — Assessment & Plan Note (Signed)
We discussed the importance of aggressive lifestyle changes, dietary modification, risk factor control and more oral fluid intake 

## 2022-11-30 NOTE — Assessment & Plan Note (Signed)
She had history of DVT and pulmonary embolism appears to be unprovoked The patient have other risk factors that could predispose her to recurrent thromboembolism For now, she will continue anticoagulation therapy indefinitely She does not need bridging treatment for future surgery

## 2022-11-30 NOTE — Progress Notes (Signed)
[facility] OFFICE PROGRESS NOTE  Plotnikov, Georgina Quint, MD  ASSESSMENT & PLAN:  History of pulmonary embolus (PE) She had history of DVT and pulmonary embolism appears to be unprovoked The patient have other risk factors that could predispose her to recurrent thromboembolism For now, she will continue anticoagulation therapy indefinitely She does not need bridging treatment for future surgery   CKD (chronic kidney disease) stage 3, GFR 30-59 ml/min (HCC) We discussed the importance of aggressive lifestyle changes, dietary modification, risk factor control and more oral fluid intake  No orders of the defined types were placed in this encounter.   The total time spent in the appointment was 20 minutes encounter with patients including review of chart and various tests results, discussions about plan of care and coordination of care plan   All questions were answered. The patient knows to call the clinic with any problems, questions or concerns. No barriers to learning was detected.    Artis Delay, MD 6/13/202411:14 AM  INTERVAL HISTORY: Sierra Johnson 72 y.o. female returns for further follow-up She has history of unprovoked DVT and PE with plan for lifelong anticoagulation therapy with low-dose Eliquis She is doing well She has lost some weight with medication but she cannot afford to stay on Mounjaro long-term She is trying on dietary modification She denies bleeding complications from anticoagulation therapy  SUMMARY OF HEMATOLOGIC HISTORY:  Summary of Hematological History Patient reports that starting in early August 2019, she began to develop new onset cough and sinus symptoms, associated with low-grade fever, for which she was prescribed 3 different courses of antibiotics without improvement.  In early September 2019, she also developed new onset shortness of breath as well as chest wall soreness from repetitive coughing.  She did not have any lower  extremity swelling, warmth, tenderness or erythema.  Her PCP ordered a CT angiogram of the chest, which showed a right upper lobe PTE, for which she was sent to the ER for further management.  While in the hospital, patient was initially started on heparin drip, and the transition to Eliquis at discharge.  She denied any extended travel, being on hormone replacement therapy, or hospitalization/surgery prior to her diagnosis of VTE.  There is no personal or family history of malignancy.  Patient's mother had a blood clot after hip surgery, but there is no other family member with history of VTE. The patient had sedentary lifestyle She has class III obesity with diabetes Between 2019-2021, she is maintained on anticoagulation therapy but after a year of anticoagulation treatment, she is on low-dose preventative maintenance Eliquis  She is retired She is not able to mobilize much due to bilateral knee pain She struggle a lot with her weight She has several medication adjustments since she was last seen here She denies complications from diabetes such as peripheral neuropathy She was prescribed gabapentin for a while for unrelated reason but is no longer taking it She continues to have occasional cough and shortness of breath The patient denies any recent signs or symptoms of bleeding such as spontaneous epistaxis, hematuria or hematochezia. She was placed on Eliquis extended duration treatment at 2.5 mg twice daily indefinitely  I have reviewed the past medical history, past surgical history, social history and family history with the patient and they are unchanged from previous note.  ALLERGIES:  is allergic to benazepril, amlodipine, and tetracycline.  MEDICATIONS:  Current Outpatient Medications  Medication Sig Dispense Refill   Accu-Chek FastClix Lancets MISC  USE TO CHECK BLOOD SUGARS TWICE A DAY 102 each 0   albuterol (VENTOLIN HFA) 108 (90 Base) MCG/ACT inhaler Inhale 2 puffs into the lungs  every 6 (six) hours as needed for wheezing or shortness of breath.     apixaban (ELIQUIS) 2.5 MG TABS tablet Take 1 tablet (2.5 mg total) by mouth 2 (two) times daily. 60 tablet 11   Blood Glucose Monitoring Suppl (ACCU-CHEK GUIDE) w/Device KIT 1 each by Does not apply route 2 (two) times daily. DX: E11.9 1 kit 0   budesonide-formoterol (SYMBICORT) 80-4.5 MCG/ACT inhaler Inhale 2 puffs into the lungs 2 (two) times daily. 1 each 3   chlorpheniramine (CHLOR-TRIMETON) 4 MG tablet Take 4 mg by mouth 3 (three) times daily.     Cholecalciferol 25 MCG (1000 UT) capsule 1,000 Units.     cyanocobalamin (VITAMIN B12) 500 MCG tablet 500 mcg.     Docusate Sodium (DSS) 100 MG CAPS      esomeprazole (NEXIUM) 40 MG capsule Take by mouth.     fluticasone (FLONASE) 50 MCG/ACT nasal spray SPRAY 2 SPRAYS INTO EACH NOSTRIL EVERY DAY 48 mL 1   fluticasone furoate-vilanterol (BREO ELLIPTA) 100-25 MCG/ACT AEPB Inhale into the lungs.     gabapentin (NEURONTIN) 300 MG capsule Take a 300 mg capsule three times a day for two weeks following surgery.Then take a 300 mg capsule two times a day for two weeks. Then take a 300 mg capsule once a day for two weeks. Then discontinue. 84 capsule 0   glucose blood (ACCU-CHEK GUIDE) test strip UUSE TO CHECK BLOOD SUGARS TWICE A DAY 200 strip 1   Melatonin 10 MG CAPS Take 10 mg by mouth at bedtime as needed (sleep).     metFORMIN (GLUCOPHAGE) 500 MG tablet TAKE 1 TABLET 2 TIMES DAILY WITH A MEAL. ANNUAL APPT IS DUE W/LABS MUST SEE PROVIDER REFILLS 180 tablet 3   montelukast (SINGULAIR) 10 MG tablet TAKE 1 TABLET BY MOUTH EVERY DAY 90 tablet 2   olmesartan (BENICAR) 20 MG tablet Take 1 tablet (20 mg total) by mouth daily. Follow-up appt w/ LABS due in [place] must see provider for future refills 90 tablet 3   oxymetazoline (AFRIN NASAL SPRAY) 0.05 % nasal spray Place 1 spray into both nostrils 2 (two) times daily as needed for congestion. 30 mL 1   pravastatin (PRAVACHOL) 20 MG tablet  Take 1 tablet (20 mg total) by mouth daily. 90 tablet 3   No current facility-administered medications for this visit.     REVIEW OF SYSTEMS:   Constitutional: Denies fevers, chills or night sweats Eyes: Denies blurriness of vision Ears, nose, mouth, throat, and face: Denies mucositis or sore throat Respiratory: Denies cough, dyspnea or wheezes Cardiovascular: Denies palpitation, chest discomfort or lower extremity swelling Gastrointestinal:  Denies nausea, heartburn or change in bowel habits Skin: Denies abnormal skin rashes Lymphatics: Denies new lymphadenopathy or easy bruising Neurological:Denies numbness, tingling or new weaknesses Behavioral/Psych: Mood is stable, no new changes  All other systems were reviewed with the patient and are negative.  PHYSICAL EXAMINATION: ECOG PERFORMANCE STATUS: 0 - Asymptomatic  Vitals:   11/30/22 1049  BP: (!) 156/91  Pulse: 68  Resp: 18  Temp: 97.9 F (36.6 C)  SpO2: 100%   Filed Weights   11/30/22 1049  Weight: 206 lb 14.4 oz (93.8 kg)    GENERAL:alert, no distress and comfortable NEURO: alert & oriented x 3 with fluent speech, no focal motor/sensory deficits  LABORATORY DATA:  I  have reviewed the data as listed     Component Value Date/Time   NA 135 11/30/2022 1025   K 4.1 11/30/2022 1025   CL 102 11/30/2022 1025   CO2 26 11/30/2022 1025   GLUCOSE 100 (H) 11/30/2022 1025   GLUCOSE 74 05/17/2006 0957   BUN 11 11/30/2022 1025   CREATININE 0.99 11/30/2022 1025   CREATININE 1.04 (H) 01/17/2021 0830   CALCIUM 9.8 11/30/2022 1025   PROT 7.9 11/30/2022 1025   ALBUMIN 3.8 11/30/2022 1025   AST 19 11/30/2022 1025   AST 17 01/17/2021 0830   ALT 14 11/30/2022 1025   ALT 14 01/17/2021 0830   ALKPHOS 87 11/30/2022 1025   BILITOT 0.4 11/30/2022 1025   BILITOT 0.4 01/17/2021 0830   GFRNONAA >60 11/30/2022 1025   GFRNONAA 59 (L) 01/17/2021 0830   GFRAA 55 (L) 11/28/2019 1028    No results found for: "SPEP", "UPEP"  Lab  Results  Component Value Date   WBC 8.5 11/30/2022   NEUTROABS 5.5 11/30/2022   HGB 12.8 11/30/2022   HCT 38.8 11/30/2022   MCV 88.0 11/30/2022   PLT 280 11/30/2022      Chemistry      Component Value Date/Time   NA 135 11/30/2022 1025   K 4.1 11/30/2022 1025   CL 102 11/30/2022 1025   CO2 26 11/30/2022 1025   BUN 11 11/30/2022 1025   CREATININE 0.99 11/30/2022 1025   CREATININE 1.04 (H) 01/17/2021 0830      Component Value Date/Time   CALCIUM 9.8 11/30/2022 1025   ALKPHOS 87 11/30/2022 1025   AST 19 11/30/2022 1025   AST 17 01/17/2021 0830   ALT 14 11/30/2022 1025   ALT 14 01/17/2021 0830   BILITOT 0.4 11/30/2022 1025   BILITOT 0.4 01/17/2021 0830

## 2022-12-05 ENCOUNTER — Telehealth: Payer: Self-pay

## 2022-12-05 ENCOUNTER — Other Ambulatory Visit: Payer: Self-pay

## 2022-12-05 MED ORDER — APIXABAN 5 MG PO TABS: ORAL_TABLET | ORAL | 11 refills | Status: DC

## 2022-12-05 NOTE — Telephone Encounter (Signed)
Returned her call. She is requesting Eliquis 5 mg Rx, to split to take 2.5 mg BID. Sent Rx to her preferred pharmacy.

## 2022-12-20 ENCOUNTER — Encounter: Payer: Self-pay | Admitting: Internal Medicine

## 2022-12-20 ENCOUNTER — Ambulatory Visit: Payer: Medicare Other | Admitting: Internal Medicine

## 2022-12-20 VITALS — BP 112/60 | HR 70 | Temp 98.2°F | Ht 60.0 in | Wt 202.0 lb

## 2022-12-20 DIAGNOSIS — Z6839 Body mass index (BMI) 39.0-39.9, adult: Secondary | ICD-10-CM

## 2022-12-20 DIAGNOSIS — E1169 Type 2 diabetes mellitus with other specified complication: Secondary | ICD-10-CM

## 2022-12-20 DIAGNOSIS — Z86711 Personal history of pulmonary embolism: Secondary | ICD-10-CM

## 2022-12-20 DIAGNOSIS — J301 Allergic rhinitis due to pollen: Secondary | ICD-10-CM | POA: Diagnosis not present

## 2022-12-20 DIAGNOSIS — E669 Obesity, unspecified: Secondary | ICD-10-CM | POA: Diagnosis not present

## 2022-12-20 DIAGNOSIS — I10 Essential (primary) hypertension: Secondary | ICD-10-CM

## 2022-12-20 DIAGNOSIS — E538 Deficiency of other specified B group vitamins: Secondary | ICD-10-CM | POA: Diagnosis not present

## 2022-12-20 MED ORDER — METHYLPREDNISOLONE 4 MG PO TBPK: ORAL_TABLET | ORAL | 0 refills | Status: DC

## 2022-12-20 MED ORDER — FEXOFENADINE HCL 180 MG PO TABS: 180.0000 mg | ORAL_TABLET | Freq: Every day | ORAL | 3 refills | Status: DC

## 2022-12-20 MED ORDER — PSEUDOEPHEDRINE HCL ER 120 MG PO TB12: 120.0000 mg | ORAL_TABLET | Freq: Two times a day (BID) | ORAL | 1 refills | Status: AC | PRN

## 2022-12-20 MED ORDER — APIXABAN 5 MG PO TABS: ORAL_TABLET | ORAL | 3 refills | Status: DC

## 2022-12-20 MED ORDER — MOUNJARO 5 MG/0.5ML ~~LOC~~ SOAJ: SUBCUTANEOUS | 5 refills | Status: DC

## 2022-12-20 MED ORDER — AZITHROMYCIN 250 MG PO TABS: ORAL_TABLET | ORAL | 0 refills | Status: DC

## 2022-12-20 NOTE — Assessment & Plan Note (Addendum)
2024 On Metformin, Toujeo On Mounjaro

## 2022-12-20 NOTE — Assessment & Plan Note (Signed)
Worse Allegra 180 qd Afrin x 3-7 d Zpac Medrol dosepack if worse

## 2022-12-20 NOTE — Progress Notes (Signed)
Subjective:  Patient ID: Sierra Johnson, female    DOB: 01-Apr-1951  Age: 73 y.o. MRN: [id number]  CC: Follow-up   HPI Sierra Johnson presents for anticoagulation, GERD, hypertension, history of DVT, asthma  Outpatient Medications Prior to Visit  Medication Sig Dispense Refill   Accu-Chek FastClix Lancets MISC USE TO CHECK BLOOD SUGARS TWICE A DAY 102 each 0   albuterol (VENTOLIN HFA) 108 (90 Base) MCG/ACT inhaler Inhale 2 puffs into the lungs every 6 (six) hours as needed for wheezing or shortness of breath.     Blood Glucose Monitoring Suppl (ACCU-CHEK GUIDE) w/Device KIT 1 each by Does not apply route 2 (two) times daily. DX: E11.9 1 kit 0   budesonide-formoterol (SYMBICORT) 80-4.5 MCG/ACT inhaler Inhale 2 puffs into the lungs 2 (two) times daily. 1 each 3   Cholecalciferol 25 MCG (1000 UT) capsule 1,000 Units.     cyanocobalamin (VITAMIN B12) 500 MCG tablet 500 mcg.     Docusate Sodium (DSS) 100 MG CAPS      esomeprazole (NEXIUM) 40 MG capsule Take by mouth.     gabapentin (NEURONTIN) 300 MG capsule Take a 300 mg capsule three times a day for two weeks following surgery.Then take a 300 mg capsule two times a day for two weeks. Then take a 300 mg capsule once a day for two weeks. Then discontinue. 84 capsule 0   glucose blood (ACCU-CHEK GUIDE) test strip UUSE TO CHECK BLOOD SUGARS TWICE A DAY 200 strip 1   Melatonin 10 MG CAPS Take 10 mg by mouth at bedtime as needed (sleep).     metFORMIN (GLUCOPHAGE) 500 MG tablet TAKE 1 TABLET 2 TIMES DAILY WITH A MEAL. ANNUAL APPT IS DUE W/LABS MUST SEE PROVIDER REFILLS 180 tablet 3   montelukast (SINGULAIR) 10 MG tablet TAKE 1 TABLET BY MOUTH EVERY DAY 90 tablet 2   olmesartan (BENICAR) 20 MG tablet Take 1 tablet (20 mg total) by mouth daily. Follow-up appt w/ LABS due in [place] must see provider for future refills 90 tablet 3   oxymetazoline (AFRIN NASAL SPRAY) 0.05 % nasal spray Place 1 spray into both nostrils 2 (two) times daily as needed for  congestion. 30 mL 1   pravastatin (PRAVACHOL) 20 MG tablet Take 1 tablet (20 mg total) by mouth daily. 90 tablet 3   apixaban (ELIQUIS) 5 MG TABS tablet Take 1/2 tablet 2.5 mg 2 times a day 15 tablet 11   chlorpheniramine (CHLOR-TRIMETON) 4 MG tablet Take 4 mg by mouth 3 (three) times daily.     fluticasone (FLONASE) 50 MCG/ACT nasal spray SPRAY 2 SPRAYS INTO EACH NOSTRIL EVERY DAY 48 mL 1   fluticasone furoate-vilanterol (BREO ELLIPTA) 100-25 MCG/ACT AEPB Inhale into the lungs.     MOUNJARO 5 MG/0.5ML Pen SMARTSIG:5 Milligram(s) SUB-Q Once a Week     No facility-administered medications prior to visit.    ROS: Review of Systems  Constitutional:  Negative for activity change, appetite change, chills, fatigue and unexpected weight change.  HENT:  Negative for congestion, mouth sores and sinus pressure.   Eyes:  Negative for visual disturbance.  Respiratory:  Negative for cough and chest tightness.   Gastrointestinal:  Negative for abdominal pain and nausea.  Genitourinary:  Negative for difficulty urinating, frequency and vaginal pain.  Musculoskeletal:  Positive for arthralgias, back pain and gait problem.  Skin:  Negative for pallor and rash.  Neurological:  Negative for dizziness, tremors, weakness, numbness and headaches.  Psychiatric/Behavioral:  Negative for  confusion and sleep disturbance.     Objective:  BP 112/60 (BP Location: Left Arm, Patient Position: Sitting, Cuff Size: Normal)   Pulse 70   Temp 98.2 F (36.8 C) (Oral)   Ht 5' (1.524 m)   Wt 202 lb (91.6 kg)   SpO2 97%   BMI 39.45 kg/m   BP Readings from Last 3 Encounters:  12/20/22 112/60  11/30/22 (!) 156/91  08/29/22 110/70    Wt Readings from Last 3 Encounters:  12/20/22 202 lb (91.6 kg)  11/30/22 206 lb 14.4 oz (93.8 kg)  08/29/22 216 lb (98 kg)    Physical Exam Constitutional:      General: She is not in acute distress.    Appearance: She is well-developed. She is obese.  HENT:     Head:  Normocephalic.     Right Ear: External ear normal.     Left Ear: External ear normal.     Nose: Nose normal.  Eyes:     General:        Right eye: No discharge.        Left eye: No discharge.     Conjunctiva/sclera: Conjunctivae normal.     Pupils: Pupils are equal, round, and reactive to light.  Neck:     Thyroid: No thyromegaly.     Vascular: No JVD.     Trachea: No tracheal deviation.  Cardiovascular:     Rate and Rhythm: Normal rate and regular rhythm.     Heart sounds: Normal heart sounds.  Pulmonary:     Effort: No respiratory distress.     Breath sounds: No stridor. No wheezing.  Abdominal:     General: Bowel sounds are normal. There is no distension.     Palpations: Abdomen is soft. There is no mass.     Tenderness: There is no abdominal tenderness. There is no guarding or rebound.  Musculoskeletal:        General: Tenderness present.     Cervical back: Normal range of motion and neck supple. No rigidity.  Lymphadenopathy:     Cervical: No cervical adenopathy.  Skin:    Findings: No erythema or rash.  Neurological:     Cranial Nerves: No cranial nerve deficit.     Motor: No abnormal muscle tone.     Coordination: Coordination normal.     Deep Tendon Reflexes: Reflexes normal.  Psychiatric:        Behavior: Behavior normal.        Thought Content: Thought content normal.        Judgment: Judgment normal.     Lab Results  Component Value Date   WBC 8.5 11/30/2022   HGB 12.8 11/30/2022   HCT 38.8 11/30/2022   PLT 280 11/30/2022   GLUCOSE 100 (H) 11/30/2022   CHOL 164 05/23/2022   TRIG 61.0 05/23/2022   HDL 76.90 05/23/2022   LDLDIRECT 119.5 08/27/2012   LDLCALC 75 05/23/2022   ALT 14 11/30/2022   AST 19 11/30/2022   NA 135 11/30/2022   K 4.1 11/30/2022   CL 102 11/30/2022   CREATININE 0.99 11/30/2022   BUN 11 11/30/2022   CO2 26 11/30/2022   TSH 1.74 05/23/2022   INR 1.1 08/03/2021   HGBA1C 6.7 (H) 08/29/2022   MICROALBUR 5.2 (H) 05/23/2022     US RENAL  Result Date: 06/05/2022 CLINICAL DATA:  Decreased GFR. EXAM: RENAL / URINARY TRACT ULTRASOUND COMPLETE COMPARISON:  None Available. FINDINGS: Right Kidney: Renal measurements: 9.2 x 4 x  4.7 cm = volume: 89 mL. Echogenicity within normal limits. No mass or hydronephrosis visualized. Left Kidney: Renal measurements: 11.5 x 4 x 4.5 cm = volume: 108 mL. Portions of the kidney are not well seen. Echogenicity within normal limits, where seen. 3.4 cm cyst, as also shown on CT abdomen of 02/27/2018. No suspicious mass or hydronephrosis visualized. Bladder: Appears normal for degree of bladder distention. Other: None. IMPRESSION: No acute findings. No hydronephrosis. Electronically Signed   By: Bary Richard M.D.   On: 06/05/2022 16:01    Assessment & Plan:   Problem List Items Addressed This Visit     B12 deficiency    On B12      Essential hypertension    Continue on Benicar      Relevant Medications   apixaban (ELIQUIS) 5 MG TABS tablet   Obesity, Class III, BMI 40-49.9 (morbid obesity) (HCC) (Chronic)    Continue with weight loss      Type 2 diabetes mellitus with obesity (HCC)    2024 On Metformin, Toujeo On Mounjaro      History of pulmonary embolus (PE)    Continue with Eliquis      Allergic rhinitis - Primary    Worse Allegra 180 qd Afrin x 3-7 d Zpac Medrol dosepack if worse         Meds ordered this encounter  Medications   azithromycin (ZITHROMAX Z-PAK) 250 MG tablet    Sig: As directed    Dispense:  6 tablet    Refill:  0   pseudoephedrine (SUDAFED) 120 MG 12 hr tablet    Sig: Take 1 tablet (120 mg total) by mouth 2 (two) times daily as needed for congestion.    Dispense:  60 tablet    Refill:  1   fexofenadine (ALLEGRA) 180 MG tablet    Sig: Take 1 tablet (180 mg total) by mouth daily.    Dispense:  90 tablet    Refill:  3   methylPREDNISolone (MEDROL DOSEPAK) 4 MG TBPK tablet    Sig: As directed    Dispense:  21 tablet    Refill:  0    DISCONTD: MOUNJARO 5 MG/0.5ML Pen    Sig: SMARTSIG:5 Milligram(s) SUB-Q Once a Week    Dispense:  2 mL    Refill:  5   apixaban (ELIQUIS) 5 MG TABS tablet    Sig: Take 1/2 tablet 2.5 mg 2 times a day    Dispense:  90 tablet    Refill:  3      Follow-up: Return in about 3 months (around 03/22/2023) for a follow-up visit.  Sonda Primes, MD

## 2023-01-01 ENCOUNTER — Encounter: Payer: Self-pay | Admitting: Internal Medicine

## 2023-01-04 ENCOUNTER — Other Ambulatory Visit: Payer: Self-pay | Admitting: Internal Medicine

## 2023-01-04 MED ORDER — TIRZEPATIDE 7.5 MG/0.5ML ~~LOC~~ SOAJ
7.5000 mg | SUBCUTANEOUS | 3 refills | Status: DC
Start: 2023-01-04 — End: 2023-02-27

## 2023-01-07 NOTE — Assessment & Plan Note (Signed)
Continue with Eliquis. ?

## 2023-01-07 NOTE — Assessment & Plan Note (Signed)
On B12 

## 2023-01-07 NOTE — Assessment & Plan Note (Signed)
Continue with weight loss. 

## 2023-01-07 NOTE — Assessment & Plan Note (Signed)
Continue on Benicar

## 2023-02-27 ENCOUNTER — Ambulatory Visit (HOSPITAL_BASED_OUTPATIENT_CLINIC_OR_DEPARTMENT_OTHER): Payer: Medicare Other | Admitting: Pulmonary Disease

## 2023-02-27 ENCOUNTER — Encounter (HOSPITAL_BASED_OUTPATIENT_CLINIC_OR_DEPARTMENT_OTHER): Payer: Self-pay | Admitting: Pulmonary Disease

## 2023-02-27 ENCOUNTER — Other Ambulatory Visit: Payer: Self-pay | Admitting: Pulmonary Disease

## 2023-02-27 VITALS — BP 126/72 | HR 80 | Resp 16 | Ht 60.0 in | Wt 205.0 lb

## 2023-02-27 DIAGNOSIS — J479 Bronchiectasis, uncomplicated: Secondary | ICD-10-CM | POA: Diagnosis not present

## 2023-02-27 DIAGNOSIS — G4733 Obstructive sleep apnea (adult) (pediatric): Secondary | ICD-10-CM

## 2023-02-27 DIAGNOSIS — R053 Chronic cough: Secondary | ICD-10-CM

## 2023-02-27 MED ORDER — AMOXICILLIN-POT CLAVULANATE 875-125 MG PO TABS: 1.0000 | ORAL_TABLET | Freq: Two times a day (BID) | ORAL | 1 refills | Status: AC

## 2023-02-27 NOTE — Assessment & Plan Note (Signed)
Chronic cough for 5 years, will treat postnasal drip with antihistaminic/decongestant combination after initial treatment with antibiotics. If persistent will consider reimaging sinuses

## 2023-02-27 NOTE — Patient Instructions (Addendum)
X Augmentin 875 bid x 10 days  X sputum culture & afb  X take symbicort 2 puffs every morning - rinse mouth  X Rx for nebuliser 3 % saline nebs daily #30  Call me with how you are doing after 10 days  Plan B for post nasal drip Sinus PE (antiH + decongestant )

## 2023-02-27 NOTE — Progress Notes (Signed)
   Subjective:    Patient ID: Sierra Johnson, female    DOB: [birth year], 72 y.o.   MRN: [id number]  HPI  72 yo woman for follow-up of OSA and unprovoked PE 02/2018 -Chronic perennial cough, Previous CT had shown mild bronchiectasis    Reports chronic cough since 2019, she went to pulmonologist at [facility] Dr. Loistine Chance -treated for allergic rhinitis and Nexium and then recommended gabapentin.  Annual follow-up visit. Accompanied by her husband, she has been coughing for 5 years since 2019.  We reviewed CT from 2023 which shows similar changes of bibasilar bronchiectasis especially right lung base.  She now reports productive sputum with greenish yellow-colored for the last 2 weeks, no fevers, no sick contacts.  This is worse in the mornings. Husband reports constant postnasal drip for the past few years. She was provided flutter valve but is not good about using this.  She was given Symbicort by her PCP but does not know when to use this  She is compliant with her CPAP machine and denies any problems with mask or pressure   Significant tests/ events reviewed PFTs 03/2019 wnl    HRCT 03/2022 >> mild, tubular bronchiectasis, bronchiolar plugging, and postobstructive consolidation, particularly in the right lung base    HRCT 11/2019 >> no ILD, Postinfectious/postinflammatory scarring in both lower lobes. LT 4.4 cm renal cyst , sub cm angiomyolipoma    CT chest 03/2019 showed new tree-in-bud infiltrates and groundglass opacities in lingula with bilateral lower lobe atelectasis and scarring and bronchiectasis  02/2018 CT angiogram chest showed pulmonary embolism in the right upper lobe.  There was also bibasilar airspace disease and radiologist favored atelectasis or consolidation left worse than right.  Review of Systems neg for any significant sore throat, dysphagia, itching, sneezing, nasal congestion or excess/ purulent secretions, fever, chills, sweats, unintended wt loss, pleuritic or  exertional cp, hempoptysis, orthopnea pnd or change in chronic leg swelling. Also denies presyncope, palpitations, heartburn, abdominal pain, nausea, vomiting, diarrhea or change in bowel or urinary habits, dysuria,hematuria, rash, arthralgias, visual complaints, headache, numbness weakness or ataxia.     Objective:   Physical Exam   Gen. Pleasant, obese, in no distress ENT - no lesions, no post nasal drip Neck: No JVD, no thyromegaly, no carotid bruits Lungs: no use of accessory muscles, no dullness to percussion, bibasal scattered rales no rhonchi  Cardiovascular: Rhythm regular, heart sounds  normal, no murmurs or gallops, no peripheral edema Musculoskeletal: No deformities, no cyanosis or clubbing , no tremors        Assessment & Plan:

## 2023-02-27 NOTE — Assessment & Plan Note (Addendum)
Emphasized airway clearance but due to change in color of sputum we will also treat with antibiotic.  She will continue to use flutter valve will add 3% saline in addition and if persistent then will consider providing her with a vest.  We may have to also reimage sinuses and lungs and consider ENT referral  Augmentin 875 bid x 10 days  X sputum culture & afb  X take symbicort 2 puffs every morning - rinse mouth  X Rx for nebuliser 3 % saline nebs daily #30

## 2023-02-27 NOTE — Assessment & Plan Note (Signed)
CPAP download was reviewed which shows excellent control of events on 12 cm, mild leak.  She is very compliant and CPAP is only helped improve her daytime somnolence and fatigue. Husband and her questions about CPAP recall, use of so clean equipment were answered  Weight loss encouraged, compliance with goal of at least 4-6 hrs every night is the expectation. Advised against medications with sedative side effects Cautioned against driving when sleepy - understanding that sleepiness will vary on a day to day basis

## 2023-02-28 ENCOUNTER — Telehealth (HOSPITAL_BASED_OUTPATIENT_CLINIC_OR_DEPARTMENT_OTHER): Payer: Self-pay | Admitting: Pulmonary Disease

## 2023-02-28 DIAGNOSIS — R053 Chronic cough: Secondary | ICD-10-CM

## 2023-02-28 NOTE — Telephone Encounter (Signed)
Patient states the saline ordered by Dr. Vassie Loll was sent to [facility] and patient was told they can no longer fill that for her. Patient was told to call her pharmacist and was told they need a script from Dr. Vassie Loll sent in before filling.   Please advise and call patient when completed if possible.  Pharmacy: [facility] [address] P: [phone]

## 2023-03-01 MED ORDER — SODIUM CHLORIDE 0.9 % IN NEBU
3.0000 mL | INHALATION_SOLUTION | RESPIRATORY_TRACT | 12 refills | Status: AC | PRN
Start: 2023-03-01 — End: ?

## 2023-03-01 NOTE — Telephone Encounter (Signed)
Sent to pharmacy 

## 2023-03-01 NOTE — Telephone Encounter (Signed)
OK to send to pharmacy? I was advised yesterday they sell it in 750 ml quantity only (just FYI)

## 2023-03-05 ENCOUNTER — Telehealth: Payer: Self-pay | Admitting: Pulmonary Disease

## 2023-03-08 NOTE — Telephone Encounter (Signed)
Pt calling back stating she still has not heard anything,

## 2023-03-11 ENCOUNTER — Encounter (HOSPITAL_BASED_OUTPATIENT_CLINIC_OR_DEPARTMENT_OTHER): Payer: Self-pay | Admitting: Pulmonary Disease

## 2023-03-13 NOTE — Telephone Encounter (Signed)
Since it is just saline you don't need a script for it. You can buy vials online. That is why it is not covered under insurance.

## 2023-03-13 NOTE — Telephone Encounter (Signed)
Saline is not covered-technically an OTC product and RX not needed.

## 2023-03-14 NOTE — Telephone Encounter (Signed)
Patient has been advised that it is an OTC medication and she will check and see if she can get it. Will let us know if any further action is needed.

## 2023-03-22 ENCOUNTER — Ambulatory Visit: Payer: Medicare Other | Admitting: Internal Medicine

## 2023-04-02 ENCOUNTER — Ambulatory Visit: Payer: Medicare Other | Admitting: Internal Medicine

## 2023-04-02 NOTE — Telephone Encounter (Signed)
Please make sure that she is doing all the mucociliary clearance regimen that Dr. Vassie Loll recommended.  She has been to take Symbicort twice daily, hypertonic nebs daily.  Flutter valve twice daily. Some days she may see some darker mucus in the first of the day and then it should lighten as a day goes on this is a normal part of our bronchiectasis.  If her mucus is remaining dark yellow to green consistently all day every day then may need to call in an antibiotic.

## 2023-04-04 ENCOUNTER — Telehealth: Payer: Self-pay | Admitting: Pulmonary Disease

## 2023-04-04 DIAGNOSIS — J479 Bronchiectasis, uncomplicated: Secondary | ICD-10-CM

## 2023-04-04 NOTE — Telephone Encounter (Signed)
I called the pharm  Gave verbal directions for neb sol  They are getting this ready for the pt now  I spoke with the pt and notified her of this  Nothing further needed

## 2023-04-04 NOTE — Telephone Encounter (Signed)
Pharmacy calling about the patient's Sodium Chloride prescripton

## 2023-05-24 DIAGNOSIS — Z01419 Encounter for gynecological examination (general) (routine) without abnormal findings: Secondary | ICD-10-CM | POA: Diagnosis not present

## 2023-05-24 DIAGNOSIS — Z1231 Encounter for screening mammogram for malignant neoplasm of breast: Secondary | ICD-10-CM | POA: Diagnosis not present

## 2023-05-24 DIAGNOSIS — Z6841 Body Mass Index (BMI) 40.0 and over, adult: Secondary | ICD-10-CM | POA: Diagnosis not present

## 2023-05-24 DIAGNOSIS — N952 Postmenopausal atrophic vaginitis: Secondary | ICD-10-CM | POA: Diagnosis not present

## 2023-05-30 ENCOUNTER — Encounter: Payer: Medicare Other | Admitting: Internal Medicine

## 2023-06-03 DIAGNOSIS — Z23 Encounter for immunization: Secondary | ICD-10-CM | POA: Diagnosis not present

## 2023-06-18 LAB — MTB-RIF NAA W AFB CULT, NON-SPUTUM: Acid Fast Culture: NEGATIVE

## 2023-06-18 LAB — MTB-RIF NAA WITHOUT AFB CULT.: Myco tuberculosis Complex: NOT DETECTED

## 2023-06-18 LAB — NOTE:

## 2023-06-26 ENCOUNTER — Ambulatory Visit (INDEPENDENT_AMBULATORY_CARE_PROVIDER_SITE_OTHER): Payer: Medicare Other | Admitting: Internal Medicine

## 2023-06-26 ENCOUNTER — Encounter: Payer: Self-pay | Admitting: Internal Medicine

## 2023-06-26 ENCOUNTER — Ambulatory Visit (INDEPENDENT_AMBULATORY_CARE_PROVIDER_SITE_OTHER): Payer: Medicare Other

## 2023-06-26 VITALS — BP 118/78 | HR 98 | Temp 98.9°F | Ht 60.0 in | Wt 216.0 lb

## 2023-06-26 DIAGNOSIS — Z0000 Encounter for general adult medical examination without abnormal findings: Secondary | ICD-10-CM

## 2023-06-26 DIAGNOSIS — Z1211 Encounter for screening for malignant neoplasm of colon: Secondary | ICD-10-CM | POA: Diagnosis not present

## 2023-06-26 DIAGNOSIS — J479 Bronchiectasis, uncomplicated: Secondary | ICD-10-CM

## 2023-06-26 DIAGNOSIS — I10 Essential (primary) hypertension: Secondary | ICD-10-CM | POA: Diagnosis not present

## 2023-06-26 DIAGNOSIS — E785 Hyperlipidemia, unspecified: Secondary | ICD-10-CM | POA: Diagnosis not present

## 2023-06-26 DIAGNOSIS — Z6841 Body Mass Index (BMI) 40.0 and over, adult: Secondary | ICD-10-CM

## 2023-06-26 DIAGNOSIS — R0989 Other specified symptoms and signs involving the circulatory and respiratory systems: Secondary | ICD-10-CM | POA: Diagnosis not present

## 2023-06-26 DIAGNOSIS — E1169 Type 2 diabetes mellitus with other specified complication: Secondary | ICD-10-CM | POA: Diagnosis not present

## 2023-06-26 DIAGNOSIS — E538 Deficiency of other specified B group vitamins: Secondary | ICD-10-CM

## 2023-06-26 DIAGNOSIS — Z7985 Long-term (current) use of injectable non-insulin antidiabetic drugs: Secondary | ICD-10-CM

## 2023-06-26 DIAGNOSIS — N183 Chronic kidney disease, stage 3 unspecified: Secondary | ICD-10-CM | POA: Diagnosis not present

## 2023-06-26 DIAGNOSIS — E669 Obesity, unspecified: Secondary | ICD-10-CM | POA: Diagnosis not present

## 2023-06-26 DIAGNOSIS — R918 Other nonspecific abnormal finding of lung field: Secondary | ICD-10-CM | POA: Diagnosis not present

## 2023-06-26 DIAGNOSIS — R059 Cough, unspecified: Secondary | ICD-10-CM | POA: Diagnosis not present

## 2023-06-26 MED ORDER — TIRZEPATIDE 2.5 MG/0.5ML ~~LOC~~ SOAJ: 2.5000 mg | SUBCUTANEOUS | 3 refills | Status: DC

## 2023-06-26 MED ORDER — HYDROCODONE BIT-HOMATROP MBR 5-1.5 MG/5ML PO SOLN: 5.0000 mL | Freq: Four times a day (QID) | ORAL | 0 refills | Status: DC | PRN

## 2023-06-26 MED ORDER — CEFDINIR 300 MG PO CAPS: 300.0000 mg | ORAL_CAPSULE | Freq: Two times a day (BID) | ORAL | 0 refills | Status: DC

## 2023-06-26 NOTE — Progress Notes (Signed)
 Subjective:  Patient ID: Sierra Johnson, female    DOB: 1951/01/11  Age: 73 y.o. MRN: [id number]  CC: Annual Exam   HPI Sierra Johnson presents for a well exam C/o URI sx's x 1 week F/u DM  Outpatient Medications Prior to Visit  Medication Sig Dispense Refill   Accu-Chek FastClix Lancets MISC USE TO CHECK BLOOD SUGARS TWICE A DAY 102 each 0   albuterol  (VENTOLIN  HFA) 108 (90 Base) MCG/ACT inhaler Inhale 2 puffs into the lungs every 6 (six) hours as needed for wheezing or shortness of breath.     apixaban  (ELIQUIS ) 5 MG TABS tablet Take 1/2 tablet 2.5 mg 2 times a day 90 tablet 3   Blood Glucose Monitoring Suppl (ACCU-CHEK GUIDE) w/Device KIT 1 each by Does not apply route 2 (two) times daily. DX: E11.9 1 kit 0   budesonide -formoterol  (SYMBICORT ) 80-4.5 MCG/ACT inhaler Inhale 2 puffs into the lungs 2 (two) times daily. 1 each 3   Cholecalciferol  25 MCG (1000 UT) capsule 1,000 Units.     Docusate Sodium  (DSS) 100 MG CAPS      esomeprazole  (NEXIUM ) 40 MG capsule Take by mouth.     glucose blood (ACCU-CHEK GUIDE) test strip UUSE TO CHECK BLOOD SUGARS TWICE A DAY 200 strip 1   montelukast  (SINGULAIR ) 10 MG tablet TAKE 1 TABLET BY MOUTH EVERY DAY 90 tablet 2   olmesartan  (BENICAR ) 20 MG tablet Take 1 tablet (20 mg total) by mouth daily. Follow-up appt w/ LABS due in [place] must see provider for future refills 90 tablet 3   oxymetazoline  (AFRIN NASAL SPRAY) 0.05 % nasal spray Place 1 spray into both nostrils 2 (two) times daily as needed for congestion. 30 mL 1   pravastatin  (PRAVACHOL ) 20 MG tablet Take 1 tablet (20 mg total) by mouth daily. 90 tablet 3   pseudoephedrine  (SUDAFED) 120 MG 12 hr tablet Take 1 tablet (120 mg total) by mouth 2 (two) times daily as needed for congestion. 60 tablet 1   sodium chloride  0.9 % nebulizer solution Take 3 mLs by nebulization as needed for wheezing. 90 mL 12   azithromycin  (ZITHROMAX  Z-PAK) 250 MG tablet As directed (Patient not taking: Reported on  06/26/2023) 6 tablet 0   methylPREDNISolone  (MEDROL  DOSEPAK) 4 MG TBPK tablet As directed (Patient not taking: Reported on 06/26/2023) 21 tablet 0   No facility-administered medications prior to visit.    ROS: Review of Systems  Constitutional:  Negative for activity change, appetite change, chills, fatigue and unexpected weight change.  HENT:  Positive for congestion, sore throat and voice change. Negative for mouth sores and sinus pressure.   Eyes:  Negative for visual disturbance.  Respiratory:  Positive for cough. Negative for chest tightness.   Gastrointestinal:  Negative for abdominal pain and nausea.  Genitourinary:  Negative for difficulty urinating, frequency and vaginal pain.  Musculoskeletal:  Negative for back pain and gait problem.  Skin:  Negative for pallor and rash.  Neurological:  Negative for dizziness, tremors, weakness, numbness and headaches.  Psychiatric/Behavioral:  Negative for confusion, sleep disturbance and suicidal ideas.     Objective:  BP 118/78 (BP Location: Left Arm, Patient Position: Sitting, Cuff Size: Normal)   Pulse 98   Temp 98.9 F (37.2 C) (Oral)   Ht 5' (1.524 m)   Wt 216 lb (98 kg)   SpO2 90%   BMI 42.18 kg/m   BP Readings from Last 3 Encounters:  06/26/23 118/78  02/27/23 126/72  12/20/22 112/60  Wt Readings from Last 3 Encounters:  06/26/23 216 lb (98 kg)  02/27/23 205 lb (93 kg)  12/20/22 202 lb (91.6 kg)    Physical Exam Constitutional:      General: She is not in acute distress.    Appearance: She is well-developed. She is obese.  HENT:     Head: Normocephalic.     Right Ear: External ear normal.     Left Ear: External ear normal.     Nose: Nose normal.  Eyes:     General:        Right eye: No discharge.        Left eye: No discharge.     Conjunctiva/sclera: Conjunctivae normal.     Pupils: Pupils are equal, round, and reactive to light.  Neck:     Thyroid : No thyromegaly.     Vascular: No JVD.     Trachea: No  tracheal deviation.  Cardiovascular:     Rate and Rhythm: Normal rate and regular rhythm.     Heart sounds: Normal heart sounds.  Pulmonary:     Effort: No respiratory distress.     Breath sounds: No stridor. No wheezing.  Abdominal:     General: Bowel sounds are normal. There is no distension.     Palpations: Abdomen is soft. There is no mass.     Tenderness: There is no abdominal tenderness. There is no guarding or rebound.  Musculoskeletal:        General: No tenderness.     Cervical back: Normal range of motion and neck supple. No rigidity.  Lymphadenopathy:     Cervical: No cervical adenopathy.  Skin:    Findings: No erythema or rash.  Neurological:     Cranial Nerves: No cranial nerve deficit.     Motor: No abnormal muscle tone.     Coordination: Coordination normal.     Deep Tendon Reflexes: Reflexes normal.  Psychiatric:        Behavior: Behavior normal.        Thought Content: Thought content normal.        Judgment: Judgment normal.   Eryth throat coughing  Lab Results  Component Value Date   WBC 8.5 11/30/2022   HGB 12.8 11/30/2022   HCT 38.8 11/30/2022   PLT 280 11/30/2022   GLUCOSE 100 (H) 11/30/2022   CHOL 164 05/23/2022   TRIG 61.0 05/23/2022   HDL 76.90 05/23/2022   LDLDIRECT 119.5 08/27/2012   LDLCALC 75 05/23/2022   ALT 14 11/30/2022   AST 19 11/30/2022   NA 135 11/30/2022   K 4.1 11/30/2022   CL 102 11/30/2022   CREATININE 0.99 11/30/2022   BUN 11 11/30/2022   CO2 26 11/30/2022   TSH 1.74 05/23/2022   INR 1.1 08/03/2021   HGBA1C 6.7 (H) 08/29/2022   MICROALBUR 5.2 (H) 05/23/2022    US  RENAL Result Date: 06/05/2022 CLINICAL DATA:  Decreased GFR. EXAM: RENAL / URINARY TRACT ULTRASOUND COMPLETE COMPARISON:  None Available. FINDINGS: Right Kidney: Renal measurements: 9.2 x 4 x 4.7 cm = volume: 89 mL. Echogenicity within normal limits. No mass or hydronephrosis visualized. Left Kidney: Renal measurements: 11.5 x 4 x 4.5 cm = volume: 108 mL.  Portions of the kidney are not well seen. Echogenicity within normal limits, where seen. 3.4 cm cyst, as also shown on CT abdomen of 02/27/2018. No suspicious mass or hydronephrosis visualized. Bladder: Appears normal for degree of bladder distention. Other: None. IMPRESSION: No acute findings. No hydronephrosis. Electronically Signed  By: Lael Hines M.D.   On: 06/05/2022 16:01    Assessment & Plan:   Problem List Items Addressed This Visit     B12 deficiency   On B12      Dyslipidemia   On Pravastatin        Essential hypertension   Continue on Benicar       Well adult exam - Primary   We discussed age appropriate health related issues, including available/recomended screening tests and vaccinations. We discussed a need for adhering to healthy diet and exercise. Labs were ordered. All questions were answered. She is a vegeterian. Colon 2008, 2000 Cologuard 2018 - ordered Shingrix , pneumovax discussed GYN and mammo yearly Prevnar, tDAP 2020       Relevant Orders   TSH   Urinalysis   CBC with Differential/Platelet   Lipid panel   Comprehensive metabolic panel   Hemoglobin A1c   Microalbumin / creatinine urine ratio   Vitamin B12   VITAMIN D  25 Hydroxy (Vit-D Deficiency, Fractures)   Cologuard   Type 2 diabetes mellitus with obesity (HCC)   On Mounjaro       Relevant Medications   tirzepatide  (MOUNJARO ) 2.5 MG/0.5ML Pen   Other Relevant Orders   TSH   Urinalysis   CBC with Differential/Platelet   Lipid panel   Comprehensive metabolic panel   Hemoglobin A1c   Microalbumin / creatinine urine ratio   Vitamin B12   VITAMIN D  25 Hydroxy (Vit-D Deficiency, Fractures)   CKD (chronic kidney disease) stage 3, GFR 30-59 ml/min (HCC)   Likely related to diabetes, hypertension, IV contrast exposure.  Hydrate well.  Will obtain renal ultrasound      Relevant Orders   TSH   Urinalysis   CBC with Differential/Platelet   Lipid panel   Comprehensive metabolic panel    Hemoglobin A1c   Microalbumin / creatinine urine ratio   Vitamin B12   VITAMIN D  25 Hydroxy (Vit-D Deficiency, Fractures)   Bronchiectasis without complication (HCC)   A flare up Start po abx Hycodan      Relevant Orders   DG Chest 2 View   Other Visit Diagnoses       Screening for colon cancer             Meds ordered this encounter  Medications   cefdinir  (OMNICEF ) 300 MG capsule    Sig: Take 1 capsule (300 mg total) by mouth 2 (two) times daily.    Dispense:  28 capsule    Refill:  0   HYDROcodone  bit-homatropine (HYCODAN) 5-1.5 MG/5ML syrup    Sig: Take 5 mLs by mouth every 6 (six) hours as needed for cough.    Dispense:  240 mL    Refill:  0   tirzepatide  (MOUNJARO ) 2.5 MG/0.5ML Pen    Sig: Inject 2.5 mg into the skin once a week.    Dispense:  2 mL    Refill:  3      Follow-up: No follow-ups on file.  Marolyn Noel, MD

## 2023-06-26 NOTE — Assessment & Plan Note (Signed)
Continue on Benicar

## 2023-06-26 NOTE — Assessment & Plan Note (Signed)
 Continue with weight loss.

## 2023-06-26 NOTE — Assessment & Plan Note (Signed)
 On B12

## 2023-06-26 NOTE — Assessment & Plan Note (Signed)
On Pravastatin 

## 2023-06-26 NOTE — Assessment & Plan Note (Signed)
 On [facility]

## 2023-06-26 NOTE — Assessment & Plan Note (Signed)
 A flare up Start po abx Hycodan

## 2023-06-26 NOTE — Assessment & Plan Note (Addendum)
 We discussed age appropriate health related issues, including available/recomended screening tests and vaccinations. We discussed a need for adhering to healthy diet and exercise. Labs were ordered. All questions were answered. She is a vegeterian. Colon 2008, 2000 Cologuard 2018 - ordered Shingrix , pneumovax discussed GYN and mammo yearly Prevnar, tDAP 2020

## 2023-06-26 NOTE — Assessment & Plan Note (Signed)
Likely related to diabetes, hypertension, IV contrast exposure.  Hydrate well.  Will obtain renal ultrasound

## 2023-06-27 ENCOUNTER — Encounter: Payer: Self-pay | Admitting: Internal Medicine

## 2023-06-29 ENCOUNTER — Other Ambulatory Visit: Payer: Self-pay | Admitting: Internal Medicine

## 2023-06-29 MED ORDER — ONDANSETRON HCL 4 MG PO TABS: 4.0000 mg | ORAL_TABLET | Freq: Three times a day (TID) | ORAL | 0 refills | Status: AC | PRN

## 2023-07-02 ENCOUNTER — Other Ambulatory Visit (HOSPITAL_COMMUNITY): Payer: Self-pay

## 2023-07-06 ENCOUNTER — Other Ambulatory Visit (INDEPENDENT_AMBULATORY_CARE_PROVIDER_SITE_OTHER): Payer: Medicare Other

## 2023-07-06 DIAGNOSIS — E669 Obesity, unspecified: Secondary | ICD-10-CM

## 2023-07-06 DIAGNOSIS — E1169 Type 2 diabetes mellitus with other specified complication: Secondary | ICD-10-CM

## 2023-07-06 DIAGNOSIS — Z0000 Encounter for general adult medical examination without abnormal findings: Secondary | ICD-10-CM

## 2023-07-06 DIAGNOSIS — N183 Chronic kidney disease, stage 3 unspecified: Secondary | ICD-10-CM | POA: Diagnosis not present

## 2023-07-06 LAB — CBC WITH DIFFERENTIAL/PLATELET
Basophils Absolute: 0 10*3/uL (ref 0.0–0.1)
Basophils Relative: 0.3 % (ref 0.0–3.0)
Eosinophils Absolute: 0.2 10*3/uL (ref 0.0–0.7)
Eosinophils Relative: 1.9 % (ref 0.0–5.0)
HCT: 39.2 % (ref 36.0–46.0)
Hemoglobin: 12.8 g/dL (ref 12.0–15.0)
Lymphocytes Relative: 26.2 % (ref 12.0–46.0)
Lymphs Abs: 2.4 10*3/uL (ref 0.7–4.0)
MCHC: 32.7 g/dL (ref 30.0–36.0)
MCV: 87 fL (ref 78.0–100.0)
Monocytes Absolute: 0.8 10*3/uL (ref 0.1–1.0)
Monocytes Relative: 9.1 % (ref 3.0–12.0)
Neutro Abs: 5.7 10*3/uL (ref 1.4–7.7)
Neutrophils Relative %: 62.5 % (ref 43.0–77.0)
Platelets: 360 10*3/uL (ref 150.0–400.0)
RBC: 4.51 Mil/uL (ref 3.87–5.11)
RDW: 13.9 % (ref 11.5–15.5)
WBC: 9.1 10*3/uL (ref 4.0–10.5)

## 2023-07-06 LAB — URINALYSIS, ROUTINE W REFLEX MICROSCOPIC
Bilirubin Urine: NEGATIVE
Hgb urine dipstick: NEGATIVE
Ketones, ur: NEGATIVE
Leukocytes,Ua: NEGATIVE
Nitrite: NEGATIVE
Specific Gravity, Urine: 1.01 (ref 1.000–1.030)
Total Protein, Urine: 30 — AB
Urine Glucose: NEGATIVE
Urobilinogen, UA: 0.2 (ref 0.0–1.0)
pH: 7.5 (ref 5.0–8.0)

## 2023-07-06 LAB — LIPID PANEL
Cholesterol: 187 mg/dL (ref 0–200)
HDL: 65.3 mg/dL (ref >39.00)
LDL Cholesterol: 106 mg/dL — ABNORMAL HIGH (ref 0–99)
NonHDL: 121.45
Total CHOL/HDL Ratio: 3
Triglycerides: 75 mg/dL (ref 0.0–149.0)
VLDL: 15 mg/dL (ref 0.0–40.0)

## 2023-07-06 LAB — COMPREHENSIVE METABOLIC PANEL
ALT: 16 U/L (ref 0–35)
AST: 22 U/L (ref 0–37)
Albumin: 4 g/dL (ref 3.5–5.2)
Alkaline Phosphatase: 83 U/L (ref 39–117)
BUN: 12 mg/dL (ref 6–23)
CO2: 24 meq/L (ref 19–32)
Calcium: 9.8 mg/dL (ref 8.4–10.5)
Chloride: 97 meq/L (ref 96–112)
Creatinine, Ser: 1.09 mg/dL (ref 0.40–1.20)
GFR: 51.62 mL/min — ABNORMAL LOW (ref >60.00)
Glucose, Bld: 127 mg/dL — ABNORMAL HIGH (ref 70–99)
Potassium: 4.9 meq/L (ref 3.5–5.1)
Sodium: 132 meq/L — ABNORMAL LOW (ref 135–145)
Total Bilirubin: 0.5 mg/dL (ref 0.2–1.2)
Total Protein: 8.3 g/dL (ref 6.0–8.3)

## 2023-07-06 LAB — VITAMIN B12: Vitamin B-12: 196 pg/mL — ABNORMAL LOW (ref 211–911)

## 2023-07-06 LAB — MICROALBUMIN / CREATININE URINE RATIO
Creatinine,U: 32.7 mg/dL
Microalb Creat Ratio: 81.8 mg/g — ABNORMAL HIGH (ref 0.0–30.0)
Microalb, Ur: 26.7 mg/dL — ABNORMAL HIGH (ref 0.0–1.9)

## 2023-07-06 LAB — VITAMIN D 25 HYDROXY (VIT D DEFICIENCY, FRACTURES): VITD: 43.39 ng/mL (ref 30.00–100.00)

## 2023-07-06 LAB — TSH: TSH: 1.29 u[IU]/mL (ref 0.35–5.50)

## 2023-07-06 LAB — HEMOGLOBIN A1C: Hgb A1c MFr Bld: 7.2 % — ABNORMAL HIGH (ref 4.6–6.5)

## 2023-07-08 ENCOUNTER — Other Ambulatory Visit: Payer: Self-pay | Admitting: Internal Medicine

## 2023-07-08 ENCOUNTER — Encounter: Payer: Self-pay | Admitting: Internal Medicine

## 2023-07-08 DIAGNOSIS — E538 Deficiency of other specified B group vitamins: Secondary | ICD-10-CM

## 2023-07-08 MED ORDER — B-12 5000 MCG SL SUBL: SUBLINGUAL_TABLET | SUBLINGUAL | 11 refills | Status: AC

## 2023-07-08 NOTE — Assessment & Plan Note (Signed)
This

## 2023-07-11 ENCOUNTER — Telehealth: Payer: Self-pay | Admitting: Internal Medicine

## 2023-07-11 ENCOUNTER — Ambulatory Visit: Payer: Medicare Other

## 2023-07-11 DIAGNOSIS — Z1211 Encounter for screening for malignant neoplasm of colon: Secondary | ICD-10-CM

## 2023-07-11 NOTE — Telephone Encounter (Signed)
Copied from CRM [id number]. Topic: General - Other >> Jul 11, 2023  1:49 PM Fredrich Romans wrote: Reason for CRM: Renee from [employer] called stating that they need the ICD 10 code for cologard  that they are going to ship out to patient Cb#: [phone] Case #: [id number]

## 2023-07-11 NOTE — Telephone Encounter (Signed)
Copied from CRM [id number]. Topic: Appointments - Scheduling Inquiry for Clinic >> Jul 11, 2023 12:57 PM Twila L wrote: Reason for CRM: Patient need b12 shot

## 2023-07-11 NOTE — Telephone Encounter (Signed)
Copied from CRM [id number]. Topic: General - Other >> Jul 11, 2023  8:20 AM Elizebeth Brooking wrote: Reason for CRM: Patient called regarding having to reschedule b12 appointment, would like for someone to give her a callback to reschedule   for something sooner this week

## 2023-07-13 ENCOUNTER — Telehealth: Payer: Self-pay | Admitting: Internal Medicine

## 2023-07-13 NOTE — Telephone Encounter (Deleted)
Copied from CRM [id number]. Topic: General - Other >> Jul 13, 2023  9:41 AM Denese Killings wrote: Reason for CRM: Jeannett Senior with [employer] is calling to check the status of ICD 10 Code for Cologuard. Case # [id number]

## 2023-07-13 NOTE — Telephone Encounter (Signed)
Reason for CRM: Jeannett Senior with [employer] is calling to check the status of ICD 10 Code for Cologuard. Case # [id number]

## 2023-07-16 ENCOUNTER — Ambulatory Visit: Payer: Medicare Other

## 2023-07-16 VITALS — Ht 60.0 in | Wt 212.0 lb

## 2023-07-16 DIAGNOSIS — E538 Deficiency of other specified B group vitamins: Secondary | ICD-10-CM

## 2023-07-16 DIAGNOSIS — Z0000 Encounter for general adult medical examination without abnormal findings: Secondary | ICD-10-CM | POA: Diagnosis not present

## 2023-07-16 MED ORDER — CYANOCOBALAMIN 1000 MCG/ML IJ SOLN
1000.0000 ug | Freq: Once | INTRAMUSCULAR | Status: AC
  Administered 2023-07-16: 1000 ug via INTRAMUSCULAR

## 2023-07-16 NOTE — Progress Notes (Addendum)
Subjective:   Sierra Johnson is a 73 y.o. female who presents for Medicare Annual (Subsequent) preventive examination.  Visit Complete: Virtual I connected with  Horatio Pel on 07/16/23 by a video and audio enabled telemedicine application and verified that I am speaking with the correct person using two identifiers.  Patient Location: Home  Provider Location: Office/Clinic  I discussed the limitations of evaluation and management by telemedicine. The patient expressed understanding and agreed to proceed.  Vital Signs: Because this visit was a virtual/telehealth visit, some criteria may be missing or patient reported. Any vitals not documented were not able to be obtained and vitals that have been documented are patient reported.   Cardiac Risk Factors include: advanced age (>37men, >45 women);Other (see comment);diabetes mellitus;hypertension;dyslipidemia, Risk factor comments: OAS, CKD     Objective:    Today's Vitals   07/16/23 1123  Weight: 212 lb (96.2 kg)  Height: 5' (1.524 m)   Body mass index is 41.4 kg/m.     07/16/2023    3:21 PM 07/13/2022   10:05 AM 08/15/2021   12:15 PM 08/03/2021   10:08 AM 07/04/2021    2:31 PM 05/31/2020    2:06 PM 05/29/2019   10:11 AM  Advanced Directives  Does Patient Have a Medical Advance Directive? No No No No No No No  Would patient like information on creating a medical advance directive?  No - Patient declined No - Patient declined Yes (MAU/Ambulatory/Procedural Areas - Information given) No - Patient declined No - Patient declined No - Patient declined    Current Medications (verified) Outpatient Encounter Medications as of 07/16/2023  Medication Sig   Accu-Chek FastClix Lancets MISC USE TO CHECK BLOOD SUGARS TWICE A DAY   albuterol (VENTOLIN HFA) 108 (90 Base) MCG/ACT inhaler Inhale 2 puffs into the lungs every 6 (six) hours as needed for wheezing or shortness of breath.   apixaban (ELIQUIS) 5 MG TABS tablet Take 1/2 tablet  2.5 mg 2 times a day   Blood Glucose Monitoring Suppl (ACCU-CHEK GUIDE) w/Device KIT 1 each by Does not apply route 2 (two) times daily. DX: E11.9   budesonide-formoterol (SYMBICORT) 80-4.5 MCG/ACT inhaler Inhale 2 puffs into the lungs 2 (two) times daily.   cefdinir (OMNICEF) 300 MG capsule Take 1 capsule (300 mg total) by mouth 2 (two) times daily.   Cholecalciferol 25 MCG (1000 UT) capsule 1,000 Units.   Cyanocobalamin (B-12) 5000 MCG SUBL 1 sublingual daily   esomeprazole (NEXIUM) 40 MG capsule Take by mouth.   glucose blood (ACCU-CHEK GUIDE) test strip UUSE TO CHECK BLOOD SUGARS TWICE A DAY   HYDROcodone bit-homatropine (HYCODAN) 5-1.5 MG/5ML syrup Take 5 mLs by mouth every 6 (six) hours as needed for cough.   montelukast (SINGULAIR) 10 MG tablet TAKE 1 TABLET BY MOUTH EVERY DAY   olmesartan (BENICAR) 20 MG tablet Take 1 tablet (20 mg total) by mouth daily. Follow-up appt w/ LABS due in [place] must see provider for future refills   ondansetron (ZOFRAN) 4 MG tablet Take 1 tablet (4 mg total) by mouth every 8 (eight) hours as needed.   oxymetazoline (AFRIN NASAL SPRAY) 0.05 % nasal spray Place 1 spray into both nostrils 2 (two) times daily as needed for congestion.   pravastatin (PRAVACHOL) 20 MG tablet Take 1 tablet (20 mg total) by mouth daily.   sodium chloride 0.9 % nebulizer solution Take 3 mLs by nebulization as needed for wheezing.   tirzepatide [facility]) 2.5 MG/0.5ML Pen Inject 2.5 mg  into the skin once a week.   azithromycin (ZITHROMAX Z-PAK) 250 MG tablet As directed (Patient not taking: Reported on 07/16/2023)   Docusate Sodium (DSS) 100 MG CAPS    methylPREDNISolone (MEDROL DOSEPAK) 4 MG TBPK tablet As directed (Patient not taking: Reported on 07/16/2023)   pseudoephedrine (SUDAFED) 120 MG 12 hr tablet Take 1 tablet (120 mg total) by mouth 2 (two) times daily as needed for congestion. (Patient not taking: Reported on 07/16/2023)   No facility-administered encounter medications on  file as of 07/16/2023.    Allergies (verified) Benazepril, Amlodipine, and Tetracycline   History: Past Medical History:  Diagnosis Date   ANEMIA-NOS 04/24/2007   Arthritis    Blood clot in vein    Cough    DIABETES MELLITUS, TYPE II 04/24/2007   Elevated glucose 2011   HYPERLIPIDEMIA 07/17/2007   mild   HYPERTENSION 04/24/2007   Knee pain    Menopause 1998   Dr. Henderson Cloud   OBSTRUCTIVE SLEEP APNEA 09/30/2009   on CPAP 2011   Pneumonia    chronic Brochitis and use inhaler   VITAMIN B12 DEFICIENCY 04/24/2007   Past Surgical History:  Procedure Laterality Date   ABDOMINAL HYSTERECTOMY     HERNIA REPAIR  04/2010   Incar. midline hernia   INCISIONAL HERNIA REPAIR N/A 06/03/2014   Procedure: LAPAROSCOPIC REPAIR RECURRENT  INCISIONAL HERNIA;  Surgeon: Violeta Gelinas, MD;  Location: [facility] OR;  Service: General;  Laterality: N/A;   INSERTION OF MESH N/A 06/03/2014   Procedure: INSERTION OF MESH;  Surgeon: Violeta Gelinas, MD;  Location: [facility] OR;  Service: General;  Laterality: N/A;   TONSILLECTOMY     TOTAL KNEE ARTHROPLASTY Right 01/27/2019   Procedure: TOTAL KNEE ARTHROPLASTY;  Surgeon: Ollen Gross, MD;  Location: [facility] ORS;  Service: Orthopedics;  Laterality: Right;    TOTAL KNEE ARTHROPLASTY Left 08/15/2021   Procedure: TOTAL KNEE ARTHROPLASTY;  Surgeon: Ollen Gross, MD;  Location: [facility] ORS;  Service: Orthopedics;  Laterality: Left;   WISDOM TOOTH EXTRACTION     Family History  Problem Relation Age of Onset   Heart disease Mother 24       ?CAD   Lung cancer Father    Heart disease Brother 15       CAD   Hypertension Other    Colon cancer Neg Hx    Esophageal cancer Neg Hx    Stomach cancer Neg Hx    Pancreatic cancer Neg Hx    Liver disease Neg Hx    Social History   Socioeconomic History   Marital status: Married    Spouse name: Channing Mutters   Number of children: 2   Years of education: Not on file   Highest education level: Bachelor's degree (e.g., BA, AB, BS)   Occupational History   Occupation: RETIRED/Accountant    Employer: [employer]  Tobacco Use   Smoking status: Never    Passive exposure: Never   Smokeless tobacco: Never  Vaping Use   Vaping status: Never Used  Substance and Sexual Activity   Alcohol use: No   Drug use: No   Sexual activity: Yes    Birth control/protection: Surgical  Other Topics Concern   Not on file  Social History Narrative   Vegan   Lives with husband   Social Drivers of Health   Financial Resource Strain: Low Risk  (07/16/2023)   Overall Financial Resource Strain (CARDIA)    Difficulty of Paying Living Expenses: Not very hard  Food Insecurity: No Food  Insecurity (07/16/2023)   Hunger Vital Sign    Worried About Running Out of Food in the Last Year: Never true    Ran Out of Food in the Last Year: Never true  Transportation Needs: No Transportation Needs (07/16/2023)   PRAPARE - Administrator, Civil Service (Medical): No    Lack of Transportation (Non-Medical): No  Physical Activity: Sufficiently Active (07/16/2023)   Exercise Vital Sign    Days of Exercise per Week: 5 days    Minutes of Exercise per Session: 60 min  Stress: No Stress Concern Present (07/16/2023)   [employer] of Occupational Health - Occupational Stress Questionnaire    Feeling of Stress : Not at all  Social Connections: Moderately Integrated (07/16/2023)   Social Connection and Isolation Panel [NHANES]    Frequency of Communication with Friends and Family: More than three times a week    Frequency of Social Gatherings with Friends and Family: Three times a week    Attends Religious Services: More than 4 times per year    Active Member of Clubs or Organizations: No    Attends Banker Meetings: Never    Marital Status: Married    Tobacco Counseling Counseling given: Not Answered   Clinical Intake:  Pre-visit preparation completed: Yes  Pain : No/denies pain     BMI - recorded:  41.4 Nutritional Status: BMI > 30  Obese Nutritional Risks: None Diabetes: Yes CBG done?: No Did pt. bring in CBG monitor from home?: No  How often do you need to have someone help you when you read instructions, pamphlets, or other written materials from your doctor or pharmacy?: 1 - Never     Information entered by :: Silvestre Mines, RMA   Activities of Daily Living    07/16/2023   11:24 AM  In your present state of health, do you have any difficulty performing the following activities:  Hearing? 0  Vision? 0  Difficulty concentrating or making decisions? 0  Walking or climbing stairs? 0  Dressing or bathing? 0  Doing errands, shopping? 0  Preparing Food and eating ? N  Using the Toilet? N  In the past six months, have you accidently leaked urine? Y  Comment only when cough  Do you have problems with loss of bowel control? N  Managing your Medications? N  Managing your Finances? N  Housekeeping or managing your Housekeeping? N    Patient Care Team: Plotnikov, Georgina Quint, MD as PCP - Lanny Cramp, MD as Consulting Physician (Orthopedic Surgery) Oretha Milch, MD as Consulting Physician (Pulmonary Disease) Ander Purpura, OD (Optometry)  Indicate any recent Medical Services you may have received from other than [facility] providers in the past year (date may be approximate).     Assessment:   This is a routine wellness examination for [place].  Hearing/Vision screen No results found.   Goals Addressed               This Visit's Progress     Patient Stated (pt-stated)        Lose weight       Depression Screen    07/16/2023   11:36 AM 06/26/2023    8:45 AM 12/20/2022   11:02 AM 07/13/2022   10:06 AM 05/23/2022    9:15 AM 07/04/2021    2:36 PM 05/20/2021    9:28 AM  PHQ 2/9 Scores  PHQ - 2 Score 0 0 0 0 0 0 0  PHQ- 9  Score 0   0 3  4    Fall Risk    07/16/2023   11:31 AM 06/26/2023    8:45 AM 12/20/2022   11:01 AM 07/13/2022   10:06 AM 05/23/2022     9:15 AM  Fall Risk   Falls in the past year? 0 0 0 0 0  Number falls in past yr: 0 0 0 0 0  Injury with Fall? 0 0 0 0 0  Risk for fall due to : No Fall Risks No Fall Risks No Fall Risks No Fall Risks No Fall Risks  Follow up Falls prevention discussed;Falls evaluation completed Falls evaluation completed Falls evaluation completed Falls prevention discussed Falls evaluation completed    MEDICARE RISK AT HOME: Medicare Risk at Home Any stairs in or around the home?: Yes (front door 8 at back door) If so, are there any without handrails?: No Home free of loose throw rugs in walkways, pet beds, electrical cords, etc?: Yes Adequate lighting in your home to reduce risk of falls?: Yes Life alert?: No Use of a cane, walker or w/c?: No Grab bars in the bathroom?: No Shower chair or bench in shower?: Yes Elevated toilet seat or a handicapped toilet?: Yes  TIMED UP AND GO:  Was the test performed?  No    Cognitive Function:        07/16/2023   11:25 AM 07/13/2022   10:09 AM  6CIT Screen  What Year? 0 points 0 points  What month? 0 points 0 points  What time? 0 points 0 points  Count back from 20 0 points 0 points  Months in reverse 0 points 0 points  Repeat phrase 2 points 0 points  Total Score 2 points 0 points    Immunizations Immunization History  Administered Date(s) Administered   Fluad Quad(high Dose 65+) 03/22/2019, 05/10/2020   Fluad Trivalent(High Dose 65+) 06/03/2023   Influenza, High Dose Seasonal PF 08/19/2018, 03/01/2021   Influenza-Unspecified 04/02/2017, 03/19/2022   PFIZER(Purple Top)SARS-COV-2 Vaccination 07/24/2019, 08/14/2019, 03/22/2020, 12/09/2020, 03/08/2021   PNEUMOCOCCAL CONJUGATE-20 06/08/2021   Pfizer Covid-19 Vaccine Bivalent Booster 20yrs & up 03/08/2021   Pfizer(Comirnaty)Fall Seasonal Vaccine 12 years and older 03/12/2022   Pneumococcal Conjugate-13 05/07/2019   RSV,unspecified 03/19/2022   Tdap 05/07/2019   Zoster Recombinant(Shingrix)  06/05/2017, 08/10/2017    TDAP status: Up to date  Flu Vaccine status: Up to date  Pneumococcal vaccine status: Up to date  Covid-19 vaccine status: Information provided on how to obtain vaccines.   Qualifies for Shingles Vaccine? Yes   Zostavax completed Yes   Shingrix Completed?: Yes  Screening Tests Health Maintenance  Topic Date Due   FOOT EXAM  01/08/2014   DEXA SCAN  Never done   COVID-19 Vaccine (8 - 2024-25 season) 02/18/2023   OPHTHALMOLOGY EXAM  08/17/2023 (Originally 05/24/2023)   MAMMOGRAM  12/07/2023   HEMOGLOBIN A1C  01/03/2024   Diabetic kidney evaluation - eGFR measurement  07/05/2024   Diabetic kidney evaluation - Urine ACR  07/05/2024   Medicare Annual Wellness (AWV)  07/15/2024   Colonoscopy  11/17/2028   DTaP/Tdap/Td (2 - Td or Tdap) 05/06/2029   Pneumonia Vaccine 22+ Years old  Completed   Hepatitis C Screening  Completed   Zoster Vaccines- Shingrix  Completed   HPV VACCINES  Aged Out   INFLUENZA VACCINE  Discontinued    Health Maintenance  Health Maintenance Due  Topic Date Due   FOOT EXAM  01/08/2014   DEXA SCAN  Never done  COVID-19 Vaccine (8 - 2024-25 season) 02/18/2023    Colorectal cancer screening: Type of screening: Colonoscopy. Completed 11/18/2018. Repeat every 10 years  Mammogram status: Completed 05/2023. Repeat every year  Lung Cancer Screening: (Low Dose CT Chest recommended if Age 34-80 years, 20 pack-year currently smoking OR have quit w/in 15years.) does not qualify.   Lung Cancer Screening Referral: N/A  Additional Screening:  Hepatitis C Screening: does qualify; Completed 04/13/2016  Vision Screening: Recommended annual ophthalmology exams for early detection of glaucoma and other disorders of the eye. Is the patient up to date with their annual eye exam?  No  Who is the provider or what is the name of the office in which the patient attends annual eye exams? [facility] If pt is not established with a provider, would  they like to be referred to a provider to establish care? No .   Dental Screening: Recommended annual dental exams for proper oral hygiene  Diabetic Foot Exam: Diabetic Foot Exam: Overdue, Pt has been advised about the importance in completing this exam. Pt is scheduled for diabetic foot exam on next office visit.  Community Resource Referral / Chronic Care Management: CRR required this visit?  No   CCM required this visit?  No     Plan:     I have personally reviewed and noted the following in the patient's chart:   Medical and social history Use of alcohol, tobacco or illicit drugs  Current medications and supplements including opioid prescriptions. Patient is not currently taking opioid prescriptions. Functional ability and status Nutritional status Physical activity Advanced directives List of other physicians Hospitalizations, surgeries, and ER visits in previous 12 months Vitals Screenings to include cognitive, depression, and falls Referrals and appointments  In addition, I have reviewed and discussed with patient certain preventive protocols, quality metrics, and best practice recommendations. A written personalized care plan for preventive services as well as general preventive health recommendations were provided to patient.     Ermal Haberer L Rhian Asebedo, CMA   07/16/2023   After Visit Summary: (MyChart) Due to this being a telephonic visit, the after visit summary with patients personalized plan was offered to patient via MyChart   Nurse Notes: Patient is due for a Foot exam, an eye exam and a DEXA, however patient stated that she has had a DEXA at the time she had a mammogram.  It is not documented in her chart. She does have an appointment coming up for her eye exam in February 2025.      Medical screening examination/treatment/procedure(s) were performed by non-physician practitioner and as supervising physician I was immediately available for consultation/collaboration.   I agree with above. Jacinta Shoe, MD

## 2023-07-16 NOTE — Addendum Note (Signed)
Addended by: Tresa Garter on: 07/16/2023 07:34 AM   Modules accepted: Orders

## 2023-07-16 NOTE — Telephone Encounter (Signed)
Ordered.  Thank you.

## 2023-07-16 NOTE — Patient Instructions (Signed)
[name] , Thank you for taking time to come for your Medicare Wellness Visit. I appreciate your ongoing commitment to your health goals. Please review the following plan we discussed and let me know if I can assist you in the future.   Referrals/Orders/Follow-Ups/Clinician Recommendations: You are due for a foot exam and an eye exam.  It was nice to meet you today, (via video visit).  Keep up the good work.  This is a list of the screening recommended for you and due dates:  Health Maintenance  Topic Date Due   Complete foot exam   01/08/2014   DEXA scan (bone density measurement)  Never done   COVID-19 Vaccine (8 - 2024-25 season) 02/18/2023   Eye exam for diabetics  08/17/2023*   Mammogram  12/07/2023   Hemoglobin A1C  01/03/2024   Yearly kidney function blood test for diabetes  07/05/2024   Yearly kidney health urinalysis for diabetes  07/05/2024   Medicare Annual Wellness Visit  07/15/2024   Colon Cancer Screening  11/17/2028   DTaP/Tdap/Td vaccine (2 - Td or Tdap) 05/06/2029   Pneumonia Vaccine  Completed   Hepatitis C Screening  Completed   Zoster (Shingles) Vaccine  Completed   HPV Vaccine  Aged Out   Flu Shot  Discontinued  *Topic was postponed. The date shown is not the original due date.    Advanced directives: (Declined) Advance directive discussed with you today. Even though you declined this today, please call our office should you change your mind, and we can give you the proper paperwork for you to fill out.  Next Medicare Annual Wellness Visit scheduled for next year: Yes

## 2023-07-16 NOTE — Progress Notes (Addendum)
Patient presented in office today for her 1st B12 injection Per Dr. Loren Racer request. Cyanocobalamin 1,000 mcg was administered IM into her RIGHT DELTOID muscled. Patient tolerated the injection well and the injection site looked fine. Patient was advised to report to the office immediately if she notices any adverse reaction. No further appointment are needed at this time.   Medical screening examination/treatment/procedure(s) were performed by non-physician practitioner and as supervising physician I was immediately available for consultation/collaboration.  I agree with above. Jacinta Shoe, MD

## 2023-07-17 NOTE — Telephone Encounter (Addendum)
Dx code has been attached to order...  DX is colon cancer

## 2023-07-17 NOTE — Addendum Note (Signed)
Addended by: Delsa Grana R on: 07/17/2023 08:06 AM   Modules accepted: Orders

## 2023-07-23 NOTE — Telephone Encounter (Signed)
Prior Authorization form/request asks a question that requires your assistance. Please see the question below and advise accordingly. The PA will not be submitted until the necessary information is received.   Please clarify what dose patient is needing. Active med in chart is the 2.5mg  but patient has previously been up to the 7.5mg , is she restarting the titration?

## 2023-07-24 ENCOUNTER — Telehealth: Payer: Self-pay

## 2023-07-24 NOTE — Telephone Encounter (Signed)
*  Primary  Pharmacy Patient Advocate Encounter   Received notification from Patient Advice Request messages that prior authorization for Mounjaro 2.5MG /0.5ML auto-injectors  is required/requested.   Insurance verification completed.   The patient is insured through [facility] .   Per test claim: PA required; PA submitted to above mentioned insurance via CoverMyMeds Key/confirmation #/EOC [id number] Status is pending

## 2023-07-24 NOTE — Telephone Encounter (Signed)
PA request has been Submitted. New Encounter created for follow up. For additional info see Pharmacy Prior Auth telephone encounter from 02/04.

## 2023-08-12 ENCOUNTER — Other Ambulatory Visit: Payer: Self-pay | Admitting: Internal Medicine

## 2023-08-12 MED ORDER — TIRZEPATIDE 5 MG/0.5ML ~~LOC~~ SOAJ: 5.0000 mg | SUBCUTANEOUS | 2 refills | Status: DC

## 2023-08-15 ENCOUNTER — Other Ambulatory Visit: Payer: Self-pay | Admitting: Internal Medicine

## 2023-08-21 ENCOUNTER — Other Ambulatory Visit (HOSPITAL_COMMUNITY): Payer: Self-pay

## 2023-08-21 DIAGNOSIS — E119 Type 2 diabetes mellitus without complications: Secondary | ICD-10-CM | POA: Diagnosis not present

## 2023-08-21 NOTE — Telephone Encounter (Signed)
 Medication has been approved and patient has been moved up to next dose

## 2023-08-31 ENCOUNTER — Other Ambulatory Visit: Payer: Self-pay | Admitting: Internal Medicine

## 2023-08-31 MED ORDER — TIRZEPATIDE 7.5 MG/0.5ML ~~LOC~~ SOAJ: 7.5000 mg | SUBCUTANEOUS | 3 refills | Status: DC

## 2023-09-21 DIAGNOSIS — M25562 Pain in left knee: Secondary | ICD-10-CM | POA: Diagnosis not present

## 2023-09-21 DIAGNOSIS — Z96653 Presence of artificial knee joint, bilateral: Secondary | ICD-10-CM | POA: Diagnosis not present

## 2023-09-28 ENCOUNTER — Encounter: Payer: Self-pay | Admitting: Internal Medicine

## 2023-11-27 ENCOUNTER — Encounter: Payer: Self-pay | Admitting: Internal Medicine

## 2023-11-29 ENCOUNTER — Telehealth (HOSPITAL_BASED_OUTPATIENT_CLINIC_OR_DEPARTMENT_OTHER): Payer: Self-pay

## 2023-11-29 DIAGNOSIS — G4733 Obstructive sleep apnea (adult) (pediatric): Secondary | ICD-10-CM

## 2023-11-29 NOTE — Telephone Encounter (Signed)
 Copied from CRM [id number]. Topic: Clinical - Order For Equipment >> Nov 29, 2023 10:11 AM Sierra Johnson wrote: Reason for CRM: Pt stated she believes she needs new CPAP machine and supplies due to her machine being moldy. Pt would like Dr. Alva to put in a prescription for a new machine. I asked if she knew the previous supplier, but she stated she did not recall who it was due to them possibly being out of business now. Pt's phone number is [phone]. >> Nov 29, 2023 10:31 AM [name] wrote: Sierra Johnson, can you look into this? I think she'll need an appointment to be seen before RA will send a new CPAP prescription for her.

## 2023-11-29 NOTE — Addendum Note (Signed)
 Addended by: Dorman Calderwood L on: 11/29/2023 05:04 PM   Modules accepted: Orders

## 2023-11-29 NOTE — Telephone Encounter (Signed)
 I have ordered CPAP and notified pt. Pt needs to be scheduled she said she tried to call and schedule but was unable to she is requesting we put her on cancellation list if we can't get her in

## 2023-12-04 ENCOUNTER — Other Ambulatory Visit: Payer: Medicare Other

## 2023-12-04 ENCOUNTER — Ambulatory Visit: Payer: Medicare Other | Admitting: Hematology and Oncology

## 2023-12-25 ENCOUNTER — Encounter: Payer: Self-pay | Admitting: Internal Medicine

## 2023-12-28 ENCOUNTER — Telehealth: Payer: Self-pay

## 2023-12-28 ENCOUNTER — Other Ambulatory Visit: Payer: Self-pay | Admitting: Internal Medicine

## 2023-12-28 MED ORDER — TIRZEPATIDE 10 MG/0.5ML ~~LOC~~ SOAJ
10.0000 mg | SUBCUTANEOUS | 3 refills | Status: DC
Start: 2023-12-28 — End: 2024-04-22

## 2023-12-28 NOTE — Telephone Encounter (Signed)
 Copied from CRM [id number]. Topic: Clinical - Order For Equipment >> Dec 27, 2023  9:31 AM Nathanel DEL wrote: Reason for CRM: pt received her new cpap about one week ago.  It was not same company as her first cpap, so they do not know her settings .  It was sent to [facility].  Can you send them her settings?  Pt not sleeping well and waking up tired.

## 2024-01-07 ENCOUNTER — Ambulatory Visit (INDEPENDENT_AMBULATORY_CARE_PROVIDER_SITE_OTHER): Admitting: Pulmonary Disease

## 2024-01-07 ENCOUNTER — Encounter (HOSPITAL_BASED_OUTPATIENT_CLINIC_OR_DEPARTMENT_OTHER): Payer: Self-pay | Admitting: Pulmonary Disease

## 2024-01-07 VITALS — BP 149/83 | HR 85 | Ht 60.0 in | Wt 208.9 lb

## 2024-01-07 DIAGNOSIS — J479 Bronchiectasis, uncomplicated: Secondary | ICD-10-CM

## 2024-01-07 DIAGNOSIS — R053 Chronic cough: Secondary | ICD-10-CM | POA: Diagnosis not present

## 2024-01-07 DIAGNOSIS — G4733 Obstructive sleep apnea (adult) (pediatric): Secondary | ICD-10-CM

## 2024-01-07 NOTE — Patient Instructions (Signed)
 X sputum afb & culture X HRCT chest   VISIT SUMMARY: Today, we discussed your chronic cough and obstructive sleep apnea. We reviewed your symptoms, previous tests, and current treatments to develop a plan moving forward.  YOUR PLAN: -CHRONIC COUGH: A chronic cough is a cough that lasts for an extended period, in your case, 5-6 years, and produces clear to yellow sputum. We will order a sputum test for mycobacterium to ensure there is no infection. You should use over-the-counter Delsym for temporary relief, especially in social situations. We will schedule a follow-up chest scan  to monitor any changes. If your symptoms worsen, we may consider a bronchoscopy to look inside your airways.  -OBSTRUCTIVE SLEEP APNEA: Obstructive sleep apnea is a condition where your breathing stops and starts during sleep due to blocked airways. Your CPAP machine is effectively managing this condition with a pressure range of 8 to 12, resulting in minimal apnea events. Continue using your CPAP machine as currently set, and we will monitor its effectiveness and make adjustments if necessary.  INSTRUCTIONS: Please complete the sputum test for mycobacterium as ordered. Continue using your CPAP machine with the current settings. Use over-the-counter Delsym for temporary relief of your cough in social situations. We will schedule a follow-up chest scan in two years. If your symptoms worsen, contact us  immediately as we may need to consider further testing such as a bronchoscopy.                      Contains text generated by [employer]bridge.                                 Contains text generated by [employer]bridge.

## 2024-01-07 NOTE — Progress Notes (Signed)
 Subjective:    Patient ID: Sierra Johnson, female    DOB: 01-28-1951, 73 y.o.   MRN: [id number]   73 yo woman for follow-up of bronchiectasis, chronic cough, OSA and unprovoked PE 02/2018 -Chronic perennial cough, Previous CT had shown mild bronchiectasis    Reports chronic cough since 2019, she went to pulmonologist at [facility] Dr. Abram -treated for allergic rhinitis and Nexium  and then recommended gabapentin .  11 month FU visit  Discussed the use of AI scribe software for clinical note transcription with the patient, who gave verbal consent to proceed.  History of Present Illness Sierra Johnson is a 73 year old female who presents with a chronic cough for follow-up evaluation.  She has had a persistent cough for five to six years, producing clear to yellow sputum. The cough worsens in cold weather but is present year-round. Antibiotic treatments have provided only temporary relief, and over-the-counter medications have been ineffective.  A chest scan in 2023 showed mild bectasis,  Sputum testing no mycobacterial infection. She has not undergone bronchoscopy. Previous pulmonary function tests were normal.  She uses a newCPAP machine for obstructive sleep apnea with pressures ranging from eight to twelve, which she finds comfortable. Her CPAP usage is consistent, with minimal apnea events at a rate of 0.3 events per hour.   DL-good control of events on auto 8-15, avg 11.6, max 12.7, good usage, mildleak ++   Significant tests/ events reviewed PFTs 03/2019 wnl     HRCT 03/2022 >> mild, tubular bronchiectasis, bronchiolar plugging, and postobstructive consolidation, particularly in the right lung base      HRCT 11/2019 >> no ILD, Postinfectious/postinflammatory scarring in both lower lobes. LT 4.4 cm renal cyst , sub cm angiomyolipoma     CT chest 03/2019 showed new tree-in-bud infiltrates and groundglass opacities in lingula with bilateral lower lobe atelectasis and scarring  and bronchiectasis   02/2018 CT angiogram chest showed pulmonary embolism in the right upper lobe.  There was also bibasilar airspace disease and radiologist favored atelectasis or consolidation left worse than right.  Review of Systems  neg for any significant sore throat, dysphagia, itching, sneezing, nasal congestion or excess/ purulent secretions, fever, chills, sweats, unintended wt loss, pleuritic or exertional cp, hempoptysis, orthopnea pnd or change in chronic leg swelling. Also denies presyncope, palpitations, heartburn, abdominal pain, nausea, vomiting, diarrhea or change in bowel or urinary habits, dysuria,hematuria, rash, arthralgias, visual complaints, headache, numbness weakness or ataxia.      Objective:   Physical Exam  Gen. Pleasant, obese, in no distress ENT - no lesions, no post nasal drip Neck: No JVD, no thyromegaly, no carotid bruits Lungs: no use of accessory muscles, no dullness to percussion, decreased without rales or rhonchi  Cardiovascular: Rhythm regular, heart sounds  normal, no murmurs or gallops, no peripheral edema Musculoskeletal: No deformities, no cyanosis or clubbing , no tremors       Assessment & Plan:   Assessment and Plan Assessment & Plan Chronic cough Bronchiectasis Chronic wet cough persisting for 5-6 years with clear to yellow sputum, worsening in cold weather. Previous antibiotics provided temporary relief. Sputum test for mycobacterium was negative, but repeat testing is advised for reliability. Chest scan from 2023 showed mild airway changes. Normal breathing tests and lack of significant findings warrant a conservative approach. Differential diagnosis includes mycobacterium infection, to be further investigated if symptoms progress. - Order sputum test for mycobacterium & culture - Schedule follow-up HR chest scan for progression -emphasized airway clearance  measures - Advise use of over-the-counter Delsym for temporary relief in social  situations - Consider bronchoscopy if scan shows progression  Obstructive sleep apnea Obstructive sleep apnea managed with CPAP. New CPAP machine set to auto settings with pressure range from 8 to 12, effective with low event rate (0.3) and minimal mask leak. CPAP has helped improve her daytime somnolence & fatigue - Continue current CPAP settings - Monitor CPAP effectiveness and adjust if necessary

## 2024-01-09 ENCOUNTER — Other Ambulatory Visit

## 2024-01-09 DIAGNOSIS — J479 Bronchiectasis, uncomplicated: Secondary | ICD-10-CM

## 2024-01-11 ENCOUNTER — Ambulatory Visit (HOSPITAL_BASED_OUTPATIENT_CLINIC_OR_DEPARTMENT_OTHER): Admitting: Pulmonary Disease

## 2024-01-12 LAB — RESPIRATORY CULTURE OR RESPIRATORY AND SPUTUM CULTURE
MICRO NUMBER:: 16735449
SPECIMEN QUALITY:: ADEQUATE

## 2024-01-13 ENCOUNTER — Ambulatory Visit: Payer: Self-pay | Admitting: Pulmonary Disease

## 2024-01-13 MED ORDER — LEVOFLOXACIN 500 MG PO TABS: 500.0000 mg | ORAL_TABLET | Freq: Every day | ORAL | 0 refills | Status: AC

## 2024-01-13 NOTE — Telephone Encounter (Signed)
 Sputum cx grew pseudomonas We should treat with antibiotic for 7 days - sent in to pharmacy

## 2024-01-14 LAB — MTB-RIF NAA W AFB CULT, NON-SPUTUM

## 2024-01-14 LAB — SPECIMEN STATUS REPORT

## 2024-01-15 ENCOUNTER — Ambulatory Visit: Payer: Self-pay

## 2024-01-15 NOTE — Telephone Encounter (Signed)
 Copied from CRM [id number]. Topic: Clinical - Prescription Issue >> Jan 14, 2024 11:37 AM Benton KIDD wrote: Reason for CRM: patient is calling cause dr jude called me today and said that he is putting me on the antibiotic and i looked up the side effects and the warning are pretty bad and it says to let the doctor know that im on a blood thinner  I'm also taking magnesium  cause I'm cramping a lot and it also says if you take that that you shouldn't take the antibiotic .  Please give patient a call concerning the side effects of the antibiotic  [phone]

## 2024-01-15 NOTE — Telephone Encounter (Addendum)
 FYI Only or Action Required?: Action required by provider: med question, needs call back asap.  Patient was last seen in primary care on 06/26/2023 by Plotnikov, [name], MD.  Called Nurse Triage reporting medication question.  Interventions attempted: Other: pulm prescribed levofloxacin  for bacteria in lungs.  Symptoms are: unchanged.  Triage Disposition: Call PCP Now  Patient/caregiver understands and will follow disposition?: Yes     Advised message be sent to pulm for Dr. Jude to go over meds with her and ensure that script for antibx is appropriate given her med list. Pt declines, wanting to send message only to Dr. Garald. Advised that may need to also call Dr. Jude later. Pt declines sending message to Dr. Jude. Pt confirms no worsening of symptoms at this time. Sending message to PCP for call back asap to pt to clarify meds and any interactions to watch out for.  Copied from CRM [id number]. Topic: Clinical - Red Word Triage >> Jan 15, 2024  8:14 AM Mesmerise C wrote: [facility] that prompted transfer to Nurse Triage: Patient stated she was prescribed a levofloxacin  500mg  from another provider and inquiring since she's on Eliquis  should it be taken also states she takes magnesium  everyday so she doesn't have cramps but she's been experiencing them  CRM # [id number] Owner: None Status: Unresolved Open  Priority: Routine Created on: 01/14/2024 11:37 AM By: Bertie Folks D Primary Information Source Sierra Johnson (Patient) Subject Sierra Johnson (Patient) Topic Clinical - Prescription Issue Communication Reason for CRM: patient is calling cause dr jude called me today and said that he is putting me on the antibiotic and i looked up the side effects and the warning are pretty bad and it says to let the doctor know that im on a blood thinner  I'm also taking magnesium  cause I'm cramping a lot and it also says if you take that that you shouldn't take the antibiotic . Please give  patient a call concerning the side effects of the antibiotic [phone]  Reason for Disposition  [1] Caller has URGENT medicine question about med that primary care doctor (or NP/PA) or specialist prescribed AND [2] triager unable to answer question  Answer Assessment - Initial Assessment Questions CRM # [id number] Owner: None Status: Unresolved Open  Priority: Routine Created on: 01/14/2024 11:37 AM By: Bertie Folks D  Primary Information  Source Sierra Johnson (Patient)  Subject Sierra Johnson (Patient)  Topic Clinical - Prescription Issue  Communication Reason for CRM: patient is calling cause dr jude called me today and said that he is putting me on the antibiotic and i looked up the side effects and the warning are pretty bad and it says to let the doctor know that im on a blood thinner  I'm also taking magnesium  cause I'm cramping a lot and it also says if you take that that you shouldn't take the antibiotic .  Please give patient a call concerning the side effects of the antibiotic  [phone]   1. NAME of MEDICINE: What medicine(s) are you calling about?    Levofloxacin  2. QUESTION: What is your question? (e.g., double dose of medicine, side effect)     patient is calling cause dr jude called me and said that he is putting me on the antibiotic and i looked up the side effects and the warning are pretty bad and it says to let the doctor know that im on a blood thinner  I'm also taking magnesium  cause I'm cramping  a lot and it also says if you take that that you shouldn't take the antibiotic . 3. PRESCRIBER: Who prescribed the medicine? Reason: if prescribed by specialist, call should be referred to that group.     pulm Dr. Jude 4. SYMPTOMS: Do you have any symptoms? If Yes, ask: What symptoms are you having?  How bad are the symptoms (e.g., mild, moderate, severe)     Not having cramps now because taking the magnesium , take it everyday No worsening symptoms,  no SOB, for cough, bacteria in lungs, prescribed antibx, not feeling worse    Advised message be sent to pulm for Dr. Jude to go over meds with her and ensure that script for antibx is appropriate given her med list. Pt declines, wanting to send message only to Dr. Garald. Advised that may need to also call Dr. Jude later. Pt declines sending message to Dr. Jude. Pt confirms no worsening of symptoms at this time. Sending message to PCP for call back asap to pt to clarify meds and any interactions to watch out for.  Protocols used: Medication Question Call-A-AH

## 2024-01-15 NOTE — Telephone Encounter (Signed)
 I was able to inform the pt of the following information per [facility] Yes, the antibiotic is necessary to treat the infection. You do no want the infection to get worse, because the next step would be even stronger IV antibiotics.  Untreated pseudomonas is riskier than taking the antibiotic.  It will not interact with your Eliquis .  You could either stop the magnesium  while taking the antibiotic, or separate taking the magnesium  and antibiotic by at least 2 hours.  Pt has stated understanding.

## 2024-01-15 NOTE — Telephone Encounter (Signed)
 Please advise as pt is afraid to take the abx rxed. Reason for CRM: patient is calling cause dr jude called me today and said that he is putting me on the antibiotic and i looked up the side effects and the warning are pretty bad and it says to let the doctor know that im on a blood thinner I'm also taking magnesium  cause I'm cramping a lot and it also says if you take that that you shouldn't take the antibiotic

## 2024-01-23 ENCOUNTER — Ambulatory Visit (HOSPITAL_BASED_OUTPATIENT_CLINIC_OR_DEPARTMENT_OTHER)
Admission: RE | Admit: 2024-01-23 | Discharge: 2024-01-23 | Disposition: A | Discharge status: Home / Self Care | Source: Ambulatory Visit | Attending: Pulmonary Disease | Admitting: Pulmonary Disease

## 2024-01-23 DIAGNOSIS — J984 Other disorders of lung: Secondary | ICD-10-CM | POA: Diagnosis not present

## 2024-01-23 DIAGNOSIS — J479 Bronchiectasis, uncomplicated: Secondary | ICD-10-CM | POA: Insufficient documentation

## 2024-01-23 DIAGNOSIS — R918 Other nonspecific abnormal finding of lung field: Secondary | ICD-10-CM | POA: Diagnosis not present

## 2024-01-23 DIAGNOSIS — R053 Chronic cough: Secondary | ICD-10-CM | POA: Insufficient documentation

## 2024-01-29 DIAGNOSIS — J300 Vasomotor rhinitis: Secondary | ICD-10-CM | POA: Diagnosis not present

## 2024-01-29 DIAGNOSIS — R053 Chronic cough: Secondary | ICD-10-CM | POA: Diagnosis not present

## 2024-02-17 ENCOUNTER — Other Ambulatory Visit: Payer: Self-pay | Admitting: Internal Medicine

## 2024-02-17 DIAGNOSIS — Z23 Encounter for immunization: Secondary | ICD-10-CM | POA: Diagnosis not present

## 2024-02-19 ENCOUNTER — Ambulatory Visit: Payer: Self-pay

## 2024-02-19 NOTE — Telephone Encounter (Signed)
 FYI Only or Action Required?: FYI only for provider.  Patient was last seen in primary care on 06/26/2023 by Plotnikov, [name], MD.  Called Nurse Triage reporting Abdominal Pain.  Symptoms began 2 weeks ago.  Interventions attempted: Rest, hydration, or home remedies.  Symptoms are: stable.  Triage Disposition: See Physician Within 24 Hours  Patient/caregiver understands and will follow disposition?: Yes        Copied from CRM #[id number]. Topic: Clinical - Red Word Triage >> Feb 19, 2024  9:13 AM Sierra Johnson wrote: Red Word that prompted transfer to Nurse Triage: Hernia, really sharp pain in stomach and also in left arm can barely lift/move around with it.   ----------------------------------------------------------------------- From previous Reason for Contact - Scheduling: Patient/patient representative is calling to schedule an appointment. Refer to attachments for appointment information. Reason for Disposition  [1] MODERATE pain (e.g., interferes with normal activities) AND [2] pain comes and goes (cramps) AND [3] present > 24 hours  (Exception: Pain with Vomiting or Diarrhea - see that Guideline.)  Answer Assessment - Initial Assessment Questions 1. LOCATION: Where does it hurt?      Left side middle 2. RADIATION: Does the pain shoot anywhere else? (e.g., chest, back)     None 3. ONSET: When did the pain begin? (e.g., minutes, hours or days ago)      X 2 weeks 4. SUDDEN: Gradual or sudden onset?     Sudden 5. PATTERN Does the pain come and go, or is it constant?     Intermittent 6. SEVERITY: How bad is the pain?  (e.g., Scale 1-10; mild, moderate, or severe)     10/10 7. RECURRENT SYMPTOM: Have you ever had this type of stomach pain before? If Yes, ask: When was the last time? and What happened that time?      No 8. CAUSE: What do you think is causing the stomach pain? (e.g., gallstones, recent abdominal surgery)     Unknown 9.  RELIEVING/AGGRAVATING FACTORS: What makes it better or worse? (e.g., antacids, bending or twisting motion, bowel movement)     Heat makes it better 10. OTHER SYMPTOMS: Do you have any other symptoms? (e.g., back pain, diarrhea, fever, urination pain, vomiting)       None  Protocols used: Abdominal Pain - Female-A-AH

## 2024-02-20 ENCOUNTER — Encounter: Payer: Self-pay | Admitting: Internal Medicine

## 2024-02-20 ENCOUNTER — Ambulatory Visit (INDEPENDENT_AMBULATORY_CARE_PROVIDER_SITE_OTHER)

## 2024-02-20 ENCOUNTER — Ambulatory Visit: Admitting: Internal Medicine

## 2024-02-20 VITALS — BP 110/60 | HR 74 | Temp 98.2°F | Ht 60.0 in | Wt 199.0 lb

## 2024-02-20 DIAGNOSIS — Z7985 Long-term (current) use of injectable non-insulin antidiabetic drugs: Secondary | ICD-10-CM

## 2024-02-20 DIAGNOSIS — Z7901 Long term (current) use of anticoagulants: Secondary | ICD-10-CM | POA: Diagnosis not present

## 2024-02-20 DIAGNOSIS — I10 Essential (primary) hypertension: Secondary | ICD-10-CM

## 2024-02-20 DIAGNOSIS — R109 Unspecified abdominal pain: Secondary | ICD-10-CM | POA: Insufficient documentation

## 2024-02-20 DIAGNOSIS — M25512 Pain in left shoulder: Secondary | ICD-10-CM | POA: Diagnosis not present

## 2024-02-20 DIAGNOSIS — E669 Obesity, unspecified: Secondary | ICD-10-CM | POA: Diagnosis not present

## 2024-02-20 DIAGNOSIS — R1012 Left upper quadrant pain: Secondary | ICD-10-CM

## 2024-02-20 DIAGNOSIS — E119 Type 2 diabetes mellitus without complications: Secondary | ICD-10-CM | POA: Diagnosis not present

## 2024-02-20 LAB — CBC WITH DIFFERENTIAL/PLATELET
Basophils Absolute: 0 K/uL (ref 0.0–0.1)
Basophils Relative: 0.6 % (ref 0.0–3.0)
Eosinophils Absolute: 0.1 K/uL (ref 0.0–0.7)
Eosinophils Relative: 1.6 % (ref 0.0–5.0)
HCT: 37.6 % (ref 36.0–46.0)
Hemoglobin: 12.5 g/dL (ref 12.0–15.0)
Lymphocytes Relative: 27 % (ref 12.0–46.0)
Lymphs Abs: 1.8 K/uL (ref 0.7–4.0)
MCHC: 33.2 g/dL (ref 30.0–36.0)
MCV: 88.2 fl (ref 78.0–100.0)
Monocytes Absolute: 0.7 K/uL (ref 0.1–1.0)
Monocytes Relative: 10.4 % (ref 3.0–12.0)
Neutro Abs: 4 K/uL (ref 1.4–7.7)
Neutrophils Relative %: 60.4 % (ref 43.0–77.0)
Platelets: 302 K/uL (ref 150.0–400.0)
RBC: 4.26 Mil/uL (ref 3.87–5.11)
RDW: 14.5 % (ref 11.5–15.5)
WBC: 6.6 K/uL (ref 4.0–10.5)

## 2024-02-20 LAB — COMPREHENSIVE METABOLIC PANEL WITH GFR
ALT: 19 U/L (ref 0–35)
AST: 26 U/L (ref 0–37)
Albumin: 4 g/dL (ref 3.5–5.2)
Alkaline Phosphatase: 78 U/L (ref 39–117)
BUN: 11 mg/dL (ref 6–23)
CO2: 27 meq/L (ref 19–32)
Calcium: 9.7 mg/dL (ref 8.4–10.5)
Chloride: 98 meq/L (ref 96–112)
Creatinine, Ser: 1.24 mg/dL — ABNORMAL HIGH (ref 0.40–1.20)
GFR: 44.02 mL/min — ABNORMAL LOW (ref >60.00)
Glucose, Bld: 76 mg/dL (ref 70–99)
Potassium: 4.5 meq/L (ref 3.5–5.1)
Sodium: 133 meq/L — ABNORMAL LOW (ref 135–145)
Total Bilirubin: 0.4 mg/dL (ref 0.2–1.2)
Total Protein: 8.1 g/dL (ref 6.0–8.3)

## 2024-02-20 LAB — URINALYSIS, ROUTINE W REFLEX MICROSCOPIC
Bilirubin Urine: NEGATIVE
Hgb urine dipstick: NEGATIVE
Ketones, ur: NEGATIVE
Leukocytes,Ua: NEGATIVE
Nitrite: NEGATIVE
Specific Gravity, Urine: 1.01 (ref 1.000–1.030)
Total Protein, Urine: 30 — AB
Urine Glucose: NEGATIVE
Urobilinogen, UA: 0.2 (ref 0.0–1.0)
pH: 7.5 (ref 5.0–8.0)

## 2024-02-20 MED ORDER — METHYLPREDNISOLONE 4 MG PO TBPK: ORAL_TABLET | ORAL | 0 refills | Status: AC

## 2024-02-20 NOTE — Patient Instructions (Signed)
Postprocedure instructions :    A Band-Aid should be left on for 12 hours. Injection therapy is not a cure itself. It is used in conjunction with other modalities. You can use nonsteroidal anti-inflammatories like ibuprofen , hot and cold compresses. Rest is recommended in the next 24 hours. You need to report immediately  if fever, chills or any signs of infection develop. 

## 2024-02-20 NOTE — Progress Notes (Signed)
 Subjective:  Patient ID: Sierra Johnson, female    DOB: Feb 22, 1951  Age: 74 y.o. MRN: [id number]  CC: Abdominal Pain (Sharp pains; been couple of weeks; on and off; had hernia repair in 2015 and dont know if that's related; no meds, just been putting heat on it and it helps; no other symptoms) and Arm Pain (L arm pain, hard to lift it; been a maybe about a month; constant aching worse when I move it )   HPI Sierra Johnson presents for Abdominal Pain  - Sharp pains; been couple of weeks; on and off; had hernia repair in 2015 and dont know if that's related; no meds, just been putting heat on it and it helps; no other symptoms) and Arm Pain (L arm pain, hard to lift it; been a maybe about a month; constant aching worse when I move it . Patient requested an injection H/o of emergent repair of incarcerated incisional hernia in 2011. Re-do in 2015 - Dr Sebastian   Outpatient Medications Prior to Visit  Medication Sig Dispense Refill   Accu-Chek FastClix Lancets MISC USE TO CHECK BLOOD SUGARS TWICE A DAY 102 each 0   albuterol  (VENTOLIN  HFA) 108 (90 Base) MCG/ACT inhaler Inhale 2 puffs into the lungs every 6 (six) hours as needed for wheezing or shortness of breath.     Blood Glucose Monitoring Suppl (ACCU-CHEK GUIDE) w/Device KIT 1 each by Does not apply route 2 (two) times daily. DX: E11.9 1 kit 0   budesonide -formoterol  (SYMBICORT ) 80-4.5 MCG/ACT inhaler Inhale 2 puffs into the lungs 2 (two) times daily. 1 each 3   Cholecalciferol  25 MCG (1000 UT) capsule 1,000 Units.     Cyanocobalamin  (B-12) 5000 MCG SUBL 1 sublingual daily 100 tablet 11   Docusate Sodium  (DSS) 100 MG CAPS      ELIQUIS  5 MG TABS tablet TAKE 1/2 TABLET (2.5 MG) 2 TIMES A DAY 60 tablet 5   esomeprazole  (NEXIUM ) 40 MG capsule Take by mouth.     glucose blood (ACCU-CHEK GUIDE) test strip UUSE TO CHECK BLOOD SUGARS TWICE A DAY 200 strip 1   ipratropium (ATROVENT ) 0.03 % nasal spray Place 2 sprays into the nose 2 (two) times  daily.     levofloxacin  (LEVAQUIN ) 500 MG tablet Take 1 tablet (500 mg total) by mouth daily. 7 tablet 0   montelukast  (SINGULAIR ) 10 MG tablet TAKE 1 TABLET BY MOUTH EVERY DAY 90 tablet 2   olmesartan  (BENICAR ) 20 MG tablet TAKE 1 TABLET BY MOUTH EVERY DAY 90 tablet 3   ondansetron  (ZOFRAN ) 4 MG tablet Take 1 tablet (4 mg total) by mouth every 8 (eight) hours as needed. 20 tablet 0   oxymetazoline  (AFRIN NASAL SPRAY) 0.05 % nasal spray Place 1 spray into both nostrils 2 (two) times daily as needed for congestion. 30 mL 1   pravastatin  (PRAVACHOL ) 20 MG tablet TAKE 1 TABLET BY MOUTH EVERY DAY 90 tablet 3   sodium chloride  0.9 % nebulizer solution Take 3 mLs by nebulization as needed for wheezing. 90 mL 12   tirzepatide  (MOUNJARO ) 10 MG/0.5ML Pen Inject 10 mg into the skin once a week. 6 mL 3   HYDROcodone  bit-homatropine (HYCODAN) 5-1.5 MG/5ML syrup Take 5 mLs by mouth every 6 (six) hours as needed for cough. 240 mL 0   methylPREDNISolone  (MEDROL  DOSEPAK) 4 MG TBPK tablet As directed 21 tablet 0   No facility-administered medications prior to visit.    ROS: Review of Systems  Constitutional:  Negative for activity change, appetite change, chills, fatigue and unexpected weight change.  HENT:  Negative for congestion, mouth sores and sinus pressure.   Eyes:  Negative for visual disturbance.  Respiratory:  Negative for cough and chest tightness.   Gastrointestinal:  Negative for abdominal pain and nausea.  Genitourinary:  Negative for difficulty urinating, frequency and vaginal pain.  Musculoskeletal:  Positive for arthralgias. Negative for back pain and gait problem.  Skin:  Negative for pallor and rash.  Neurological:  Negative for dizziness, tremors, weakness, numbness and headaches.  Hematological:  Bruises/bleeds easily.  Psychiatric/Behavioral:  Negative for confusion and sleep disturbance.     Objective:  BP 110/60   Pulse 74   Temp 98.2 F (36.8 C)   Ht 5' (1.524 m)   Wt 199  lb (90.3 kg)   SpO2 99%   BMI 38.86 kg/m   BP Readings from Last 3 Encounters:  02/20/24 110/60  01/07/24 (!) 149/83  06/26/23 118/78    Wt Readings from Last 3 Encounters:  02/20/24 199 lb (90.3 kg)  01/07/24 208 lb 14.4 oz (94.8 kg)  07/16/23 212 lb (96.2 kg)    Physical Exam Constitutional:      General: She is not in acute distress.    Appearance: She is well-developed. She is obese.  HENT:     Head: Normocephalic.     Right Ear: External ear normal.     Left Ear: External ear normal.     Nose: Nose normal.  Eyes:     General:        Right eye: No discharge.        Left eye: No discharge.     Conjunctiva/sclera: Conjunctivae normal.     Pupils: Pupils are equal, round, and reactive to light.  Neck:     Thyroid : No thyromegaly.     Vascular: No JVD.     Trachea: No tracheal deviation.  Cardiovascular:     Rate and Rhythm: Normal rate and regular rhythm.     Heart sounds: Normal heart sounds.  Pulmonary:     Effort: No respiratory distress.     Breath sounds: No stridor. No wheezing.  Abdominal:     General: Bowel sounds are normal. There is no distension.     Palpations: Abdomen is soft. There is no mass.     Tenderness: There is no abdominal tenderness. There is no guarding or rebound.  Musculoskeletal:        General: No tenderness.     Cervical back: Normal range of motion and neck supple. No rigidity.  Lymphadenopathy:     Cervical: No cervical adenopathy.  Skin:    Findings: No erythema or rash.  Neurological:     Cranial Nerves: No cranial nerve deficit.     Motor: No abnormal muscle tone.     Coordination: Coordination normal.     Deep Tendon Reflexes: Reflexes normal.  Psychiatric:        Behavior: Behavior normal.        Thought Content: Thought content normal.        Judgment: Judgment normal.   Left shoulder with pain    Procedure :Joint Injection, left shoulder   Indication:  Subacromial bursitis with refractory  chronic pain.    Risks including unsuccessful procedure , bleeding on blood thinners, infection, bruising, skin atrophy, steroid flare-up and others were explained to the patient in detail as well as the benefits. Informed consent was obtained and signed.   Tthe  patient was placed in a comfortable position. Lateral approach was used. Skin was prepped with Betadine  and alcohol  and anesthetized with a cooling spray. Then, a 5 cc syringe with a 2 inch long 24-gauge needle was used for a joint injection.. The needle was advanced  Into the subacromial space.The bursa was injected with 3 mL of 2% lidocaine  and 40 mg of Depo-Medrol  .  Band-Aid was applied.   Tolerated well. Complications: None. Good pain relief following the procedure.   Postprocedure instructions :    A Band-Aid should be left on for 12 hours. Injection therapy is not a cure itself. It is used in conjunction with other modalities. You can use nonsteroidal anti-inflammatories like ibuprofen  , hot and cold compresses. Rest is recommended in the next 24 hours. You need to report immediately  if fever, chills or any signs of infection develop.  Lab Results  Component Value Date   WBC 6.6 02/20/2024   HGB 12.5 02/20/2024   HCT 37.6 02/20/2024   PLT 302.0 02/20/2024   GLUCOSE 76 02/20/2024   CHOL 187 07/06/2023   TRIG 75.0 07/06/2023   HDL 65.30 07/06/2023   LDLDIRECT 119.5 08/27/2012   LDLCALC 106 (H) 07/06/2023   ALT 19 02/20/2024   AST 26 02/20/2024   NA 133 (L) 02/20/2024   K 4.5 02/20/2024   CL 98 02/20/2024   CREATININE 1.24 (H) 02/20/2024   BUN 11 02/20/2024   CO2 27 02/20/2024   TSH 1.29 07/06/2023   INR 1.1 08/03/2021   HGBA1C 7.2 (H) 07/06/2023    CT Chest High Resolution Result Date: 01/27/2024 CLINICAL DATA:  Bronchiectasis, chronic cough EXAM: CT CHEST WITHOUT CONTRAST TECHNIQUE: Multidetector CT imaging of the chest was performed following the standard protocol without intravenous contrast. High resolution imaging of the  lungs, as well as inspiratory and expiratory imaging, was performed. RADIATION DOSE REDUCTION: This exam was performed according to the departmental dose-optimization program which includes automated exposure control, adjustment of the mA and/or kV according to patient size and/or use of iterative reconstruction technique. COMPARISON:  04/11/2022 FINDINGS: Cardiovascular: No significant vascular findings. Normal heart size. No pericardial effusion. Mediastinum/Nodes: No enlarged mediastinal, hilar, or axillary lymph nodes. Thyroid  gland, trachea, and esophagus demonstrate no significant findings. Lungs/Pleura: Bandlike scarring of the bilateral lung bases with associated clustered centrilobular nodularity and tubular bronchiectasis, appearance unchanged compared to prior examination dated 04/11/2022 (series 4, image 88). Lobular air trapping on expiratory phase imaging. No pleural effusion or pneumothorax. Upper Abdomen: No acute abnormality. Musculoskeletal: No chest wall abnormality. No acute osseous findings. IMPRESSION: 1. Bandlike scarring of the bilateral lung bases with associated clustered centrilobular nodularity and tubular bronchiectasis, appearance unchanged compared to prior examination dated 04/11/2022. Findings are consistent with chronic sequelae of infection or aspiration, which again may be ongoing. 2. Lobular air trapping on expiratory phase imaging, consistent with small airways disease. Electronically Signed   By: Marolyn JONETTA Jaksch M.D.   On: 01/27/2024 15:47    Assessment & Plan:   Problem List Items Addressed This Visit     Abdominal pain   Abdominal Pain  - Sharp pains; been couple of weeks; on and off; had hernia repair.  Resolving Labs w/UA Surg ref - Dr Wilson Abd CT if not better H/o of emergent repair of incarcerated incisional hernia in 2011. Re-do in 2015 - Dr Sebastian       Relevant Orders   CBC with Differential/Platelet (Completed)   Comprehensive metabolic panel with  GFR (Completed)  Urinalysis   Ambulatory referral to General Surgery   Chronic anticoagulation   Chronic anticoagulation acknowledged On Eliquis       Essential hypertension   Continue on Benicar       Shoulder pain, left - Primary   Likely due to subacromial bursitis x2-3 wks X ray See procedure-shoulder injection       Relevant Orders   DG Shoulder Left (Completed)   Type 2 diabetes mellitus in patient with obesity (HCC)   On Mounjaro          Meds ordered this encounter  Medications   methylPREDNISolone  (MEDROL  DOSEPAK) 4 MG TBPK tablet    Sig: As directed    Dispense:  21 tablet    Refill:  0      Follow-up: Return in about 3 months (around 05/21/2024).  Marolyn Noel, MD

## 2024-02-20 NOTE — Assessment & Plan Note (Addendum)
 Abdominal Pain  - Sharp pains; been couple of weeks; on and off; had hernia repair.  Resolving Labs w/UA Surg ref - Dr Wilson Abd CT if not better H/o of emergent repair of incarcerated incisional hernia in 2011. Re-do in 2015 - Dr Sebastian

## 2024-02-20 NOTE — Assessment & Plan Note (Addendum)
 Likely due to subacromial bursitis x2-3 wks X ray See procedure-shoulder injection

## 2024-02-21 ENCOUNTER — Ambulatory Visit: Payer: Self-pay | Admitting: Internal Medicine

## 2024-03-05 ENCOUNTER — Other Ambulatory Visit: Payer: Self-pay | Admitting: Internal Medicine

## 2024-03-05 MED ORDER — COVID-19 MRNA VAC-TRIS(PFIZER) 30 MCG/0.3ML IM SUSY: 0.3000 mL | PREFILLED_SYRINGE | Freq: Once | INTRAMUSCULAR | 0 refills | Status: AC

## 2024-03-05 NOTE — Progress Notes (Signed)
 Requested COVID-vaccine prescription.  Done

## 2024-03-13 ENCOUNTER — Other Ambulatory Visit: Payer: Self-pay | Admitting: Internal Medicine

## 2024-03-13 MED ORDER — MOLNUPIRAVIR EUA 200MG CAPSULE: 4.0000 | ORAL_CAPSULE | Freq: Two times a day (BID) | ORAL | 0 refills | Status: AC

## 2024-04-14 ENCOUNTER — Telehealth: Payer: Self-pay | Admitting: *Deleted

## 2024-04-14 DIAGNOSIS — Z7901 Long term (current) use of anticoagulants: Secondary | ICD-10-CM | POA: Insufficient documentation

## 2024-04-14 NOTE — Assessment & Plan Note (Signed)
 Chronic anticoagulation acknowledged On Eliquis 

## 2024-04-14 NOTE — Assessment & Plan Note (Signed)
Continue on Benicar

## 2024-04-14 NOTE — Telephone Encounter (Signed)
 Copied from CRM [id number]. Topic: Clinical - Prescription Issue >> Apr 14, 2024 10:36 AM Corean SAUNDERS wrote: Reason for CRM: Patient states her current mask is not working and she is up all night and very exhausted in the day due to this. Patient states she needs the P10 mask as that is what works for her.  Patient is requesting to order to be faxed to [facility]  in [place] and to call her to make sure this is doable or completed.  I called and spoke with patient, I let her know that the order is written so she can order the mask of her choice so she should be able to call and order the P10 nasal pillow mask.  She states that [facility] told her they cannot get that for her.  They substituted the Rio II nasal pillow interface and this does not work as well, she wakes up tugging at it during the night.  It is not as comfortable.  I let her know I would send a message to Dr. Jude to see what he recommends and once we hear back from him we will call her back.  She verbalized understanding.    Dr. Alva, Please advise on what patient can do as far as masks, the one that works well for there, the P10, [facility] is telling her they cannot get that one, they substituted another mask and it does not work well.  Here is a copy of her most recent download.  Thank you.

## 2024-04-15 ENCOUNTER — Encounter: Payer: Self-pay | Admitting: Internal Medicine

## 2024-04-15 NOTE — Assessment & Plan Note (Signed)
 On [facility]

## 2024-04-16 NOTE — Telephone Encounter (Signed)
 Left pt message on VM to notify we need to know a DME company to send P10 order to if they have in stock instead of [facility] also noted pt that she can order online and try to get insurance to reimburse her for this. Asked pt to return call to let us  know which route she wanted to go

## 2024-04-20 ENCOUNTER — Encounter: Payer: Self-pay | Admitting: Internal Medicine

## 2024-04-22 ENCOUNTER — Other Ambulatory Visit: Payer: Self-pay | Admitting: Internal Medicine

## 2024-04-22 MED ORDER — TIRZEPATIDE 12.5 MG/0.5ML ~~LOC~~ SOAJ: 12.5000 mg | SUBCUTANEOUS | 5 refills | Status: DC

## 2024-04-30 DIAGNOSIS — K219 Gastro-esophageal reflux disease without esophagitis: Secondary | ICD-10-CM | POA: Diagnosis not present

## 2024-04-30 DIAGNOSIS — R053 Chronic cough: Secondary | ICD-10-CM | POA: Diagnosis not present

## 2024-05-07 MED ORDER — METHYLPREDNISOLONE ACETATE 80 MG/ML IJ SUSP
80.0000 mg | Freq: Once | INTRAMUSCULAR | Status: AC
  Administered 2024-02-20: 80 mg via INTRAMUSCULAR

## 2024-05-07 NOTE — Addendum Note (Signed)
 Addended byBETHA LUCETTA CLEATRICE LELON on: 05/07/2024 07:43 AM   Modules accepted: Orders

## 2024-05-10 ENCOUNTER — Encounter (HOSPITAL_BASED_OUTPATIENT_CLINIC_OR_DEPARTMENT_OTHER): Payer: Self-pay | Admitting: Pulmonary Disease

## 2024-05-12 NOTE — Telephone Encounter (Signed)
 Printed to be faxed

## 2024-05-12 NOTE — Telephone Encounter (Signed)
 Faxed confirmation recevied

## 2024-05-21 ENCOUNTER — Ambulatory Visit: Admitting: Internal Medicine

## 2024-05-21 ENCOUNTER — Encounter: Payer: Self-pay | Admitting: Internal Medicine

## 2024-05-21 VITALS — BP 120/70 | HR 74 | Temp 97.5°F | Ht 60.0 in | Wt 197.0 lb

## 2024-05-21 DIAGNOSIS — E66813 Obesity, class 3: Secondary | ICD-10-CM

## 2024-05-21 DIAGNOSIS — E538 Deficiency of other specified B group vitamins: Secondary | ICD-10-CM

## 2024-05-21 DIAGNOSIS — E669 Obesity, unspecified: Secondary | ICD-10-CM

## 2024-05-21 DIAGNOSIS — M25512 Pain in left shoulder: Secondary | ICD-10-CM

## 2024-05-21 DIAGNOSIS — N183 Chronic kidney disease, stage 3 unspecified: Secondary | ICD-10-CM

## 2024-05-21 DIAGNOSIS — I10 Essential (primary) hypertension: Secondary | ICD-10-CM

## 2024-05-21 MED ORDER — APIXABAN 5 MG PO TABS: ORAL_TABLET | ORAL | 5 refills | Status: AC

## 2024-05-21 MED ORDER — PRAVASTATIN SODIUM 20 MG PO TABS: 20.0000 mg | ORAL_TABLET | Freq: Every day | ORAL | 3 refills | Status: AC

## 2024-05-21 NOTE — Progress Notes (Unsigned)
 Subjective:  Patient ID: Sierra Johnson, female    DOB: [birth year]  Age: 73 y.o. MRN: [id number]  CC: Medical Management of Chronic Issues (25mo follow up; want to discuss pain in L arm that was mentioned before, still hurting)   HPI DANAYAH SMYRE presents for L shoulder pain - not better F/u on DVT/PI   Outpatient Medications Prior to Visit  Medication Sig Dispense Refill  . Accu-Chek FastClix Lancets MISC USE TO CHECK BLOOD SUGARS TWICE A DAY 102 each 0  . albuterol  (VENTOLIN  HFA) 108 (90 Base) MCG/ACT inhaler Inhale 2 puffs into the lungs every 6 (six) hours as needed for wheezing or shortness of breath.    . Blood Glucose Monitoring Suppl (ACCU-CHEK GUIDE) w/Device KIT 1 each by Does not apply route 2 (two) times daily. DX: E11.9 1 kit 0  . budesonide -formoterol  (SYMBICORT ) 80-4.5 MCG/ACT inhaler Inhale 2 puffs into the lungs 2 (two) times daily. 1 each 3  . Cholecalciferol  25 MCG (1000 UT) capsule 1,000 Units.    . Cyanocobalamin  (B-12) 5000 MCG SUBL 1 sublingual daily 100 tablet 11  . Docusate Sodium  (DSS) 100 MG CAPS     . esomeprazole  (NEXIUM ) 40 MG capsule Take by mouth.    SABRA glucose blood (ACCU-CHEK GUIDE) test strip UUSE TO CHECK BLOOD SUGARS TWICE A DAY 200 strip 1  . ipratropium (ATROVENT ) 0.03 % nasal spray Place 2 sprays into the nose 2 (two) times daily.    . levofloxacin  (LEVAQUIN ) 500 MG tablet Take 1 tablet (500 mg total) by mouth daily. 7 tablet 0  . methylPREDNISolone  (MEDROL  DOSEPAK) 4 MG TBPK tablet As directed 21 tablet 0  . montelukast  (SINGULAIR ) 10 MG tablet TAKE 1 TABLET BY MOUTH EVERY DAY 90 tablet 2  . olmesartan  (BENICAR ) 20 MG tablet TAKE 1 TABLET BY MOUTH EVERY DAY 90 tablet 3  . ondansetron  (ZOFRAN ) 4 MG tablet Take 1 tablet (4 mg total) by mouth every 8 (eight) hours as needed. 20 tablet 0  . oxymetazoline  (AFRIN NASAL SPRAY) 0.05 % nasal spray Place 1 spray into both nostrils 2 (two) times daily as needed for congestion. 30 mL 1  . sodium chloride   0.9 % nebulizer solution Take 3 mLs by nebulization as needed for wheezing. 90 mL 12  . tirzepatide  (MOUNJARO ) 12.5 MG/0.5ML Pen Inject 12.5 mg into the skin once a week. 2 mL 5  . ELIQUIS  5 MG TABS tablet TAKE 1/2 TABLET (2.5 MG) 2 TIMES A DAY 60 tablet 5  . pravastatin  (PRAVACHOL ) 20 MG tablet TAKE 1 TABLET BY MOUTH EVERY DAY 90 tablet 3   No facility-administered medications prior to visit.    ROS: Review of Systems  Constitutional:  Negative for activity change, appetite change, chills, fatigue and unexpected weight change.  HENT:  Negative for congestion, mouth sores and sinus pressure.   Eyes:  Negative for visual disturbance.  Respiratory:  Positive for cough. Negative for chest tightness.   Gastrointestinal:  Negative for abdominal pain and nausea.  Genitourinary:  Negative for difficulty urinating, frequency and vaginal pain.  Musculoskeletal:  Negative for back pain and gait problem.  Skin:  Negative for pallor and rash.  Neurological:  Negative for dizziness, tremors, weakness, numbness and headaches.  Psychiatric/Behavioral:  Negative for confusion and sleep disturbance.     Objective:  BP 120/70   Pulse 74   Temp (!) 97.5 F (36.4 C)   Ht 5' (1.524 m)   Wt 197 lb (89.4 kg)   SpO2  98%   BMI 38.47 kg/m   BP Readings from Last 3 Encounters:  05/21/24 120/70  02/20/24 110/60  01/07/24 (!) 149/83    Wt Readings from Last 3 Encounters:  05/21/24 197 lb (89.4 kg)  02/20/24 199 lb (90.3 kg)  01/07/24 208 lb 14.4 oz (94.8 kg)    Physical Exam Constitutional:      General: She is not in acute distress.    Appearance: She is well-developed.  HENT:     Head: Normocephalic.     Right Ear: External ear normal.     Left Ear: External ear normal.     Nose: Nose normal.  Eyes:     General:        Right eye: No discharge.        Left eye: No discharge.     Conjunctiva/sclera: Conjunctivae normal.     Pupils: Pupils are equal, round, and reactive to light.  Neck:      Thyroid : No thyromegaly.     Vascular: No JVD.     Trachea: No tracheal deviation.  Cardiovascular:     Rate and Rhythm: Normal rate and regular rhythm.     Heart sounds: Normal heart sounds.  Pulmonary:     Effort: No respiratory distress.     Breath sounds: No stridor. No wheezing.  Abdominal:     General: Bowel sounds are normal. There is no distension.     Palpations: Abdomen is soft. There is no mass.     Tenderness: There is no abdominal tenderness. There is no guarding or rebound.  Musculoskeletal:        General: No tenderness.     Cervical back: Normal range of motion and neck supple. No rigidity.  Lymphadenopathy:     Cervical: No cervical adenopathy.  Skin:    Findings: No erythema or rash.  Neurological:     Cranial Nerves: No cranial nerve deficit.     Motor: No abnormal muscle tone.     Coordination: Coordination normal.     Deep Tendon Reflexes: Reflexes normal.  Psychiatric:        Behavior: Behavior normal.        Thought Content: Thought content normal.        Judgment: Judgment normal.     Lab Results  Component Value Date   WBC 6.6 02/20/2024   HGB 12.5 02/20/2024   HCT 37.6 02/20/2024   PLT 302.0 02/20/2024   GLUCOSE 76 02/20/2024   CHOL 187 07/06/2023   TRIG 75.0 07/06/2023   HDL 65.30 07/06/2023   LDLDIRECT 119.5 08/27/2012   LDLCALC 106 (H) 07/06/2023   ALT 19 02/20/2024   AST 26 02/20/2024   NA 133 (L) 02/20/2024   K 4.5 02/20/2024   CL 98 02/20/2024   CREATININE 1.24 (H) 02/20/2024   BUN 11 02/20/2024   CO2 27 02/20/2024   TSH 1.29 07/06/2023   INR 1.1 08/03/2021   HGBA1C 7.2 (H) 07/06/2023    CT Chest High Resolution Result Date: 01/27/2024 CLINICAL DATA:  Bronchiectasis, chronic cough EXAM: CT CHEST WITHOUT CONTRAST TECHNIQUE: Multidetector CT imaging of the chest was performed following the standard protocol without intravenous contrast. High resolution imaging of the lungs, as well as inspiratory and expiratory imaging, was  performed. RADIATION DOSE REDUCTION: This exam was performed according to the departmental dose-optimization program which includes automated exposure control, adjustment of the mA and/or kV according to patient size and/or use of iterative reconstruction technique. COMPARISON:  04/11/2022 FINDINGS: Cardiovascular:  No significant vascular findings. Normal heart size. No pericardial effusion. Mediastinum/Nodes: No enlarged mediastinal, hilar, or axillary lymph nodes. Thyroid  gland, trachea, and esophagus demonstrate no significant findings. Lungs/Pleura: Bandlike scarring of the bilateral lung bases with associated clustered centrilobular nodularity and tubular bronchiectasis, appearance unchanged compared to prior examination dated 04/11/2022 (series 4, image 88). Lobular air trapping on expiratory phase imaging. No pleural effusion or pneumothorax. Upper Abdomen: No acute abnormality. Musculoskeletal: No chest wall abnormality. No acute osseous findings. IMPRESSION: 1. Bandlike scarring of the bilateral lung bases with associated clustered centrilobular nodularity and tubular bronchiectasis, appearance unchanged compared to prior examination dated 04/11/2022. Findings are consistent with chronic sequelae of infection or aspiration, which again may be ongoing. 2. Lobular air trapping on expiratory phase imaging, consistent with small airways disease. Electronically Signed   By: Marolyn JONETTA Jaksch M.D.   On: 01/27/2024 15:47    Assessment & Plan:   Problem List Items Addressed This Visit     B12 deficiency - Primary   On B12      CKD (chronic kidney disease) stage 3, GFR 30-59 ml/min (HCC)   Likely related to diabetes, hypertension, IV contrast exposure.  Hydrate well.  Will obtain renal ultrasound      Relevant Orders   Comprehensive metabolic panel with GFR   Hemoglobin A1c   Lipid panel   Essential hypertension   Continue on Benicar       Relevant Medications   apixaban  (ELIQUIS ) 5 MG TABS tablet    pravastatin  (PRAVACHOL ) 20 MG tablet   Obesity, Class III, BMI 40-49.9 (morbid obesity) (HCC) (Chronic)   Cont Metformin  Mounjaro  12.5 mg/wk      Shoulder pain, left   Relevant Orders   Ambulatory referral to Sports Medicine   Type 2 diabetes mellitus in patient with obesity (HCC)   On Mounjaro       Relevant Medications   pravastatin  (PRAVACHOL ) 20 MG tablet   Other Relevant Orders   Comprehensive metabolic panel with GFR   Hemoglobin A1c   Lipid panel    Use Symbicort  for cough  Meds ordered this encounter  Medications  . apixaban  (ELIQUIS ) 5 MG TABS tablet    Sig: TAKE 1/2 TABLET (2.5 MG) 2 TIMES A DAY    Dispense:  60 tablet    Refill:  5  . pravastatin  (PRAVACHOL ) 20 MG tablet    Sig: Take 1 tablet (20 mg total) by mouth daily.    Dispense:  90 tablet    Refill:  3      Follow-up: Return in about 3 months (around 08/19/2024) for a follow-up visit.  Marolyn Noel, MD

## 2024-05-21 NOTE — Assessment & Plan Note (Addendum)
 On B12

## 2024-05-21 NOTE — Assessment & Plan Note (Signed)
Likely related to diabetes, hypertension, IV contrast exposure.  Hydrate well.  Will obtain renal ultrasound

## 2024-05-21 NOTE — Assessment & Plan Note (Signed)
 On [facility]

## 2024-05-21 NOTE — Assessment & Plan Note (Signed)
 Cont Metformin  Mounjaro  12.5 mg/wk

## 2024-05-21 NOTE — Assessment & Plan Note (Signed)
Continue on Benicar

## 2024-05-21 NOTE — Patient Instructions (Signed)
 Blue-Emu cream or Aspercream - use 2-3 times a day

## 2024-05-23 ENCOUNTER — Encounter: Payer: Self-pay | Admitting: Internal Medicine

## 2024-05-26 ENCOUNTER — Other Ambulatory Visit: Payer: Self-pay

## 2024-05-26 MED ORDER — BUDESONIDE-FORMOTEROL FUMARATE 80-4.5 MCG/ACT IN AERO: 2.0000 | INHALATION_SPRAY | Freq: Two times a day (BID) | RESPIRATORY_TRACT | 3 refills | Status: DC

## 2024-05-28 ENCOUNTER — Ambulatory Visit: Admitting: Sports Medicine

## 2024-06-06 ENCOUNTER — Ambulatory Visit: Admitting: Sports Medicine

## 2024-06-06 NOTE — Progress Notes (Unsigned)
"             ° °   Ben Jackson D.[facility] [address] [place] [address] Phone: [phone]   Assessment and Plan:     ***    Pertinent previous records reviewed include ***   Follow Up: ***     Subjective:   I, Sierra Johnson, am serving as a neurosurgeon for Doctor Morene Mace  Chief Complaint: left shoulder pain   HPI:   06/09/2024 Patient is a 73 year old female with left shoulder pain. Patient states  Relevant Historical Information: ***  Additional pertinent review of systems negative.  Current Medications[1]   Objective:     There were no vitals filed for this visit.    There is no height or weight on file to calculate BMI.    Physical Exam:    ***   Electronically signed by:  Odis Mace D.[facility] 7:37 AM 06/06/2024    [1]  Current Outpatient Medications:    Accu-Chek FastClix Lancets MISC, USE TO CHECK BLOOD SUGARS TWICE A DAY, Disp: 102 each, Rfl: 0   albuterol  (VENTOLIN  HFA) 108 (90 Base) MCG/ACT inhaler, Inhale 2 puffs into the lungs every 6 (six) hours as needed for wheezing or shortness of breath., Disp: , Rfl:    apixaban  (ELIQUIS ) 5 MG TABS tablet, TAKE 1/2 TABLET (2.5 MG) 2 TIMES A DAY, Disp: 60 tablet, Rfl: 5   Blood Glucose Monitoring Suppl (ACCU-CHEK GUIDE) w/Device KIT, 1 each by Does not apply route 2 (two) times daily. DX: E11.9, Disp: 1 kit, Rfl: 0   budesonide -formoterol  (SYMBICORT ) 80-4.5 MCG/ACT inhaler, Inhale 2 puffs into the lungs 2 (two) times daily., Disp: 1 each, Rfl: 3   Cholecalciferol  25 MCG (1000 UT) capsule, 1,000 Units., Disp: , Rfl:    Cyanocobalamin  (B-12) 5000 MCG SUBL, 1 sublingual daily, Disp: 100 tablet, Rfl: 11   Docusate Sodium  (DSS) 100 MG CAPS, , Disp: , Rfl:    esomeprazole  (NEXIUM ) 40 MG capsule, Take by mouth., Disp: , Rfl:    glucose blood (ACCU-CHEK GUIDE) test strip, UUSE TO CHECK BLOOD SUGARS TWICE A DAY, Disp: 200 strip, Rfl: 1   ipratropium  (ATROVENT ) 0.03 % nasal spray, Place 2 sprays into the nose 2 (two) times daily., Disp: , Rfl:    levofloxacin  (LEVAQUIN ) 500 MG tablet, Take 1 tablet (500 mg total) by mouth daily., Disp: 7 tablet, Rfl: 0   methylPREDNISolone  (MEDROL  DOSEPAK) 4 MG TBPK tablet, As directed, Disp: 21 tablet, Rfl: 0   montelukast  (SINGULAIR ) 10 MG tablet, TAKE 1 TABLET BY MOUTH EVERY DAY, Disp: 90 tablet, Rfl: 2   olmesartan  (BENICAR ) 20 MG tablet, TAKE 1 TABLET BY MOUTH EVERY DAY, Disp: 90 tablet, Rfl: 3   ondansetron  (ZOFRAN ) 4 MG tablet, Take 1 tablet (4 mg total) by mouth every 8 (eight) hours as needed., Disp: 20 tablet, Rfl: 0   oxymetazoline  (AFRIN NASAL SPRAY) 0.05 % nasal spray, Place 1 spray into both nostrils 2 (two) times daily as needed for congestion., Disp: 30 mL, Rfl: 1   pravastatin  (PRAVACHOL ) 20 MG tablet, Take 1 tablet (20 mg total) by mouth daily., Disp: 90 tablet, Rfl: 3   sodium chloride  0.9 % nebulizer solution, Take 3 mLs by nebulization as needed for wheezing., Disp: 90 mL, Rfl: 12   tirzepatide  (MOUNJARO ) 12.5 MG/0.5ML Pen, Inject 12.5 mg into the skin once a week., Disp: 2 mL, Rfl: 5  "

## 2024-06-09 ENCOUNTER — Ambulatory Visit: Admitting: Sports Medicine

## 2024-06-09 ENCOUNTER — Other Ambulatory Visit

## 2024-06-09 VITALS — HR 90 | Ht 60.0 in | Wt 197.0 lb

## 2024-06-09 DIAGNOSIS — E119 Type 2 diabetes mellitus without complications: Secondary | ICD-10-CM | POA: Diagnosis not present

## 2024-06-09 DIAGNOSIS — M25512 Pain in left shoulder: Secondary | ICD-10-CM

## 2024-06-09 DIAGNOSIS — N183 Chronic kidney disease, stage 3 unspecified: Secondary | ICD-10-CM | POA: Diagnosis not present

## 2024-06-09 DIAGNOSIS — M7542 Impingement syndrome of left shoulder: Secondary | ICD-10-CM | POA: Diagnosis not present

## 2024-06-09 DIAGNOSIS — E669 Obesity, unspecified: Secondary | ICD-10-CM

## 2024-06-09 DIAGNOSIS — G8929 Other chronic pain: Secondary | ICD-10-CM

## 2024-06-09 LAB — COMPREHENSIVE METABOLIC PANEL WITH GFR
ALT: 20 U/L (ref 3–35)
AST: 23 U/L (ref 5–37)
Albumin: 4.2 g/dL (ref 3.5–5.2)
Alkaline Phosphatase: 75 U/L (ref 39–117)
BUN: 10 mg/dL (ref 6–23)
CO2: 27 meq/L (ref 19–32)
Calcium: 10 mg/dL (ref 8.4–10.5)
Chloride: 99 meq/L (ref 96–112)
Creatinine, Ser: 1.18 mg/dL (ref 0.40–1.20)
GFR: 46.62 mL/min — ABNORMAL LOW
Glucose, Bld: 74 mg/dL (ref 70–99)
Potassium: 4.5 meq/L (ref 3.5–5.1)
Sodium: 133 meq/L — ABNORMAL LOW (ref 135–145)
Total Bilirubin: 0.4 mg/dL (ref 0.2–1.2)
Total Protein: 8 g/dL (ref 6.0–8.3)

## 2024-06-09 LAB — LIPID PANEL
Cholesterol: 207 mg/dL — ABNORMAL HIGH (ref 28–200)
HDL: 80 mg/dL
LDL Cholesterol: 115 mg/dL — ABNORMAL HIGH (ref 10–99)
NonHDL: 126.67
Total CHOL/HDL Ratio: 3
Triglycerides: 56 mg/dL (ref 10.0–149.0)
VLDL: 11.2 mg/dL (ref 0.0–40.0)

## 2024-06-09 LAB — HEMOGLOBIN A1C: Hgb A1c MFr Bld: 5.8 % (ref 4.6–6.5)

## 2024-06-09 NOTE — Patient Instructions (Signed)
 Shoulder HEP   PT referral   6 week follow up

## 2024-06-18 ENCOUNTER — Other Ambulatory Visit (HOSPITAL_COMMUNITY): Payer: Self-pay

## 2024-06-18 ENCOUNTER — Telehealth: Payer: Self-pay

## 2024-06-18 NOTE — Telephone Encounter (Signed)
 Pharmacy Patient Advocate Encounter   Received notification from Onbase that prior authorization for Mounjaro  12.5 is required/requested.   Insurance verification completed.   The patient is insured through [facility].   Per test claim: PA required; PA submitted to above mentioned insurance via Latent Key/confirmation #/EOC [id number] Status is pending

## 2024-06-20 ENCOUNTER — Encounter: Payer: Self-pay | Admitting: Physical Therapy

## 2024-06-20 ENCOUNTER — Other Ambulatory Visit: Payer: Self-pay

## 2024-06-20 ENCOUNTER — Ambulatory Visit: Admitting: Physical Therapy

## 2024-06-20 ENCOUNTER — Other Ambulatory Visit (HOSPITAL_COMMUNITY): Payer: Self-pay

## 2024-06-20 DIAGNOSIS — M25512 Pain in left shoulder: Secondary | ICD-10-CM

## 2024-06-20 DIAGNOSIS — G8929 Other chronic pain: Secondary | ICD-10-CM

## 2024-06-20 DIAGNOSIS — M6281 Muscle weakness (generalized): Secondary | ICD-10-CM

## 2024-06-20 NOTE — Therapy (Signed)
 " OUTPATIENT PHYSICAL THERAPY EVALUATION   Patient Name: Sierra Johnson MRN: [id number] DOB:[birth year], 74 y.o., female Today's Date: 06/20/2024   END OF SESSION:  PT End of Session - 06/20/24 1110     Visit Number 1    Number of Visits 9    Date for Recertification  08/15/24    Authorization Type MCR    Progress Note Due on Visit 10    PT Start Time 1103    PT Stop Time 1145    PT Time Calculation (min) 42 min    Activity Tolerance Patient tolerated treatment well    Behavior During Therapy [facility] for tasks assessed/performed          Past Medical History:  Diagnosis Date   ANEMIA-NOS 04/24/2007   Arthritis    Blood clot in vein    Cough    DIABETES MELLITUS, TYPE II 04/24/2007   Elevated glucose 2011   HYPERLIPIDEMIA 07/17/2007   mild   HYPERTENSION 04/24/2007   Knee pain    Menopause 1998   Dr. Curlene   OBSTRUCTIVE SLEEP APNEA 09/30/2009   on CPAP 2011   Pneumonia    chronic Brochitis and use inhaler   VITAMIN B12 DEFICIENCY 04/24/2007   Past Surgical History:  Procedure Laterality Date   ABDOMINAL HYSTERECTOMY     HERNIA REPAIR  04/2010   Incar. midline hernia   INCISIONAL HERNIA REPAIR N/A 06/03/2014   Procedure: LAPAROSCOPIC REPAIR RECURRENT  INCISIONAL HERNIA;  Surgeon: Dann Hummer, MD;  Location: [facility] OR;  Service: General;  Laterality: N/A;   INSERTION OF MESH N/A 06/03/2014   Procedure: INSERTION OF MESH;  Surgeon: Dann Hummer, MD;  Location: [facility] OR;  Service: General;  Laterality: N/A;   TONSILLECTOMY     TOTAL KNEE ARTHROPLASTY Right 01/27/2019   Procedure: TOTAL KNEE ARTHROPLASTY;  Surgeon: Melodi Lerner, MD;  Location: [facility];  Service: Orthopedics;  Laterality: Right;    TOTAL KNEE ARTHROPLASTY Left 08/15/2021   Procedure: TOTAL KNEE ARTHROPLASTY;  Surgeon: Melodi Lerner, MD;  Location: [facility];  Service: Orthopedics;  Laterality: Left;   WISDOM TOOTH EXTRACTION     Patient Active Problem List   Diagnosis Date Noted   Chronic  anticoagulation 04/14/2024   Shoulder pain, left 02/20/2024   Abdominal pain 02/20/2024   Hyperlipidemia 05/23/2022   Primary osteoarthritis of left knee 08/15/2021   Decreased GFR 05/12/2020   Bronchiectasis without complication (HCC) 12/15/2019   Renal cyst 12/15/2019   CKD (chronic kidney disease) stage 3, GFR 30-59 ml/min (HCC) 11/28/2019   Arthritis 10/07/2019   History of total knee replacement, right 04/07/2019   History of community acquired pneumonia 11/29/2018   Sleep difficulties 11/29/2018   Preop exam for internal medicine 08/19/2018   Allergic rhinitis 08/19/2018   Hypoxemia 03/05/2018   History of pulmonary embolus (PE) 02/28/2018   Hyponatremia 02/28/2018   Cough 02/21/2018   Headache 01/30/2018   Asthmatic bronchitis 01/18/2018   Insomnia 03/08/2016   Eczema 04/13/2015   Edema 02/11/2015   S/P laparoscopic hernia repair 06/03/2014   OA (osteoarthritis) of knee 04/30/2014   Osteoarthritis of left knee 04/30/2014   Recurrent ventral incisional hernia 10/16/2012   S/P repair of ventral hernia 08/28/2012   Type 2 diabetes mellitus in patient with obesity (HCC) 05/07/2012   Obesity, Class III, BMI 40-49.9 (morbid obesity) (HCC) 01/18/2012   Well adult exam 04/17/2011   Sinusitis, acute 04/17/2011   SHOULDER PAIN 12/07/2009   POLYURIA 12/07/2009   Obstructive sleep apnea  09/30/2009   FATIGUE 09/15/2009   PARESTHESIA 06/08/2008   Dyslipidemia 07/17/2007   B12 deficiency 04/24/2007   Anemia 04/24/2007   Essential hypertension 04/24/2007    PCP: Garald [name], MD  REFERRING PROVIDER: Leonce Katz, DO  REFERRING DIAG: Chronic left shoulder pain; Impingement syndrome of shoulder region, left  THERAPY DIAG:  Chronic left shoulder pain  Muscle weakness (generalized)  Rationale for Evaluation and Treatment: Rehabilitation  ONSET DATE: Chronic   SUBJECTIVE SUBJECTIVE STATEMENT: Patient reports left shoulder pain that has been there for  months. She does remember how it happened. She did have an injection which has helped. She reports that moving the left shoulder or trying to reach behind her back causes pain, and when lying on the left shoulder it hurts. She denies any pain at rest.   Hand dominance: Right  PERTINENT HISTORY: See PMH above  PAIN:  Are you having pain? Yes:  NPRS scale: 0/10 at rest, 4/10 with shoulder movement Pain location: Left shoulder  Pain description: Dull achy Aggravating factors: Moving the left shoulder, reaching overhead or behind back Relieving factors: Rest  PRECAUTIONS: None  RED FLAGS: None   WEIGHT BEARING RESTRICTIONS: No  FALLS:  Has patient fallen in last 6 months? No  PLOF: Independent  PATIENT GOALS: Pain relief   OBJECTIVE:  Note: Objective measures were completed at Evaluation unless otherwise noted. PATIENT SURVEYS:  QuickDASH: 27.3% disability  COGNITION: Overall cognitive status: Within functional limits for tasks assessed     SENSATION: WFL  POSTURE: Rounded shoulder posture  UPPER EXTREMITY ROM:   Active ROM Right eval Left eval  Shoulder flexion 145 125  Shoulder extension    Shoulder abduction 130 95  Shoulder adduction    Shoulder internal rotation T8 T12  Shoulder external rotation 65 / T2 50 / C7  Elbow flexion    Elbow extension    Wrist flexion    Wrist extension    Wrist ulnar deviation    Wrist radial deviation    Wrist pronation    Wrist supination    (Blank rows = not tested)  UPPER EXTREMITY MMT:  MMT Right eval Left eval  Shoulder flexion 5 4  Shoulder extension    Shoulder abduction 5 4  Shoulder adduction    Shoulder internal rotation 5 4+  Shoulder external rotation 5 4  Middle trapezius    Lower trapezius    Elbow flexion    Elbow extension    Wrist flexion    Wrist extension    Wrist ulnar deviation    Wrist radial deviation    Wrist pronation    Wrist supination    Grip strength (lbs)    (Blank rows  = not tested)  JOINT MOBILITY TESTING:  Hypomobility noted left shoulder  PALPATION:  Tender to palpation left periacromial region  TREATMENT [facility] Adult PT Treatment:                                                DATE: 06/20/2024 Supine dowel shoulder flexion x 10 Standing wall slide for shoulder elevation x 10 Banded shoulder ER with yellow x 10  Discussed etiology of symptoms likely being rotator cuff related pain, incorporating exercises to improve shoulder motion and strengthening, only doing wall slide while on cruise coming up soon  PATIENT EDUCATION: Education details: Exam findings, POC, HEP Person educated: Patient Education method: Explanation, Demonstration, Tactile cues, Verbal cues, and Handouts Education comprehension: verbalized understanding, returned demonstration, verbal cues required, tactile cues required, and needs further education  HOME EXERCISE PROGRAM: Access Code: [email or web address]    ASSESSMENT: CLINICAL IMPRESSION: Patient is a 74 y.o. female who was seen today for physical therapy evaluation and treatment for chronic left shoulder pain. Her left shoulder symptoms seem consistent with rotator cuff related pain. She demonstrates limitations in her left shoulder active motion and strength, with pain while performing active movement or lifting type tasks with the left shoulder.  OBJECTIVE IMPAIRMENTS: decreased activity tolerance, decreased ROM, decreased strength, postural dysfunction, and pain.   ACTIVITY LIMITATIONS: carrying, lifting, bathing, dressing, reach over head, and hygiene/grooming  PARTICIPATION LIMITATIONS: meal prep, cleaning, laundry, driving, and shopping  PERSONAL FACTORS: Fitness and Time since onset of injury/illness/exacerbation are also affecting patient's functional outcome.   REHAB POTENTIAL: Good  CLINICAL DECISION  MAKING: Stable/uncomplicated  EVALUATION COMPLEXITY: Low   GOALS: Goals reviewed with patient? Yes  SHORT TERM GOALS: Target date: 07/18/2024  Patient will be I with initial HEP in order to progress with therapy. Baseline: HEP provided at eval Goal status: INITIAL  2.  Patient will report left shoulder pain with movement and activity </= 2/10 in order to reduce functional limitations Baseline: 4/10 Goal status: INITIAL   LONG TERM GOALS: Target date: 08/15/2024  Patient will be I with final HEP to maintain progress from PT. Baseline: HEP provided at eval Goal status: INITIAL  2.  Patient will report QuickDASH </= 10% in order to indicate an improvement in their functional status Baseline: 27.3% Goal status: INITIAL  3.  Patient will demonstrate left shoulder strength 5/5 MMT in order to improve activity tolerance and reduce pain with shoulder movement Baseline: see limitations above Goal status: INITIAL  4.  Patient will demonstrate left shoulder flexion AROM >/= 140 deg and functional IR reach behind back >/= T10 in order to improve ability to perform self care tasks Baseline: see limitations above Goal status: INITIAL   PLAN: PT FREQUENCY: 1x/week  PT DURATION: 8 weeks  PLANNED INTERVENTIONS: 97164- PT Re-evaluation, 97750- Physical Performance Testing, 97110-Therapeutic exercises, 97530- Therapeutic activity, 97112- Neuromuscular re-education, 97535- Self Care, 02859- Manual therapy, Patient/Family education, Joint mobilization, Joint manipulation, Spinal manipulation, Spinal mobilization, Cryotherapy, and Moist heat  PLAN FOR NEXT SESSION: Review HEP and progress PRN, manual for left shoulder mobility, progress left shoulder AAROM>AROM and rotator cuff strengthening, postural strengthening   Elaine Daring, PT, DPT, LAT, ATC 06/20/2024  12:02 PM Phone: [phone] Fax: [phone]  "

## 2024-06-20 NOTE — Telephone Encounter (Signed)
 Pharmacy Patient Advocate Encounter  Received notification from [employer] that Prior Authorization for Moun[employer]o  12.5 has been APPROVED from 06/18/24 to 06/18/25    PA #/Case ID/Reference #: # [id number]

## 2024-06-20 NOTE — Patient Instructions (Signed)
 Access Code: [email or web address] URL: [email or web address] Date: 06/20/2024 Prepared by: Elaine Daring  Exercises - Supine Shoulder Flexion Extension AAROM with Dowel  - 1 x daily - 2 sets - 10 reps - Standing shoulder flexion wall slides  - 2 x daily - 10 reps - Shoulder External Rotation with Anchored Resistance  - 1 x daily - 2 sets - 10 reps

## 2024-06-24 ENCOUNTER — Ambulatory Visit: Payer: Self-pay | Admitting: Internal Medicine

## 2024-07-02 ENCOUNTER — Encounter: Payer: Self-pay | Admitting: Physical Therapy

## 2024-07-02 ENCOUNTER — Other Ambulatory Visit: Payer: Self-pay

## 2024-07-02 ENCOUNTER — Ambulatory Visit (INDEPENDENT_AMBULATORY_CARE_PROVIDER_SITE_OTHER): Admitting: Physical Therapy

## 2024-07-02 DIAGNOSIS — M25512 Pain in left shoulder: Secondary | ICD-10-CM

## 2024-07-02 DIAGNOSIS — M6281 Muscle weakness (generalized): Secondary | ICD-10-CM | POA: Diagnosis not present

## 2024-07-02 DIAGNOSIS — G8929 Other chronic pain: Secondary | ICD-10-CM | POA: Diagnosis not present

## 2024-07-02 NOTE — Therapy (Signed)
 " OUTPATIENT PHYSICAL THERAPY TREATMENT   Patient Name: Sierra Johnson MRN: [id number] DOB:05/21/1951, 74 y.o., female Today's Date: 07/02/2024   END OF SESSION:  PT End of Session - 07/02/24 1148     Visit Number 2    Number of Visits 9    Date for Recertification  08/15/24    Authorization Type MCR    Progress Note Due on Visit 10    PT Start Time 1145    PT Stop Time 1225    PT Time Calculation (min) 40 min    Activity Tolerance Patient tolerated treatment well    Behavior During Therapy [facility] for tasks assessed/performed           Past Medical History:  Diagnosis Date   ANEMIA-NOS 04/24/2007   Arthritis    Blood clot in vein    Cough    DIABETES MELLITUS, TYPE II 04/24/2007   Elevated glucose 2011   HYPERLIPIDEMIA 07/17/2007   mild   HYPERTENSION 04/24/2007   Knee pain    Menopause 1998   Dr. Curlene   OBSTRUCTIVE SLEEP APNEA 09/30/2009   on CPAP 2011   Pneumonia    chronic Brochitis and use inhaler   VITAMIN B12 DEFICIENCY 04/24/2007   Past Surgical History:  Procedure Laterality Date   ABDOMINAL HYSTERECTOMY     HERNIA REPAIR  04/2010   Incar. midline hernia   INCISIONAL HERNIA REPAIR N/A 06/03/2014   Procedure: LAPAROSCOPIC REPAIR RECURRENT  INCISIONAL HERNIA;  Surgeon: Dann Hummer, MD;  Location: [facility];  Service: General;  Laterality: N/A;   INSERTION OF MESH N/A 06/03/2014   Procedure: INSERTION OF MESH;  Surgeon: Dann Hummer, MD;  Location: [facility];  Service: General;  Laterality: N/A;   TONSILLECTOMY     TOTAL KNEE ARTHROPLASTY Right 01/27/2019   Procedure: TOTAL KNEE ARTHROPLASTY;  Surgeon: Melodi Lerner, MD;  Location: [facility];  Service: Orthopedics;  Laterality: Right;    TOTAL KNEE ARTHROPLASTY Left 08/15/2021   Procedure: TOTAL KNEE ARTHROPLASTY;  Surgeon: Melodi Lerner, MD;  Location: [facility];  Service: Orthopedics;  Laterality: Left;   WISDOM TOOTH EXTRACTION     Patient Active Problem List   Diagnosis Date Noted   Chronic  anticoagulation 04/14/2024   Shoulder pain, left 02/20/2024   Abdominal pain 02/20/2024   Hyperlipidemia 05/23/2022   Primary osteoarthritis of left knee 08/15/2021   Decreased GFR 05/12/2020   Bronchiectasis without complication (HCC) 12/15/2019   Renal cyst 12/15/2019   CKD (chronic kidney disease) stage 3, GFR 30-59 ml/min (HCC) 11/28/2019   Arthritis 10/07/2019   History of total knee replacement, right 04/07/2019   History of community acquired pneumonia 11/29/2018   Sleep difficulties 11/29/2018   Preop exam for internal medicine 08/19/2018   Allergic rhinitis 08/19/2018   Hypoxemia 03/05/2018   History of pulmonary embolus (PE) 02/28/2018   Hyponatremia 02/28/2018   Cough 02/21/2018   Headache 01/30/2018   Asthmatic bronchitis 01/18/2018   Insomnia 03/08/2016   Eczema 04/13/2015   Edema 02/11/2015   S/P laparoscopic hernia repair 06/03/2014   OA (osteoarthritis) of knee 04/30/2014   Osteoarthritis of left knee 04/30/2014   Recurrent ventral incisional hernia 10/16/2012   S/P repair of ventral hernia 08/28/2012   Type 2 diabetes mellitus in patient with obesity (HCC) 05/07/2012   Obesity, Class III, BMI 40-49.9 (morbid obesity) (HCC) 01/18/2012   Well adult exam 04/17/2011   Sinusitis, acute 04/17/2011   SHOULDER PAIN 12/07/2009   POLYURIA 12/07/2009   Obstructive sleep  apnea 09/30/2009   FATIGUE 09/15/2009   PARESTHESIA 06/08/2008   Dyslipidemia 07/17/2007   B12 deficiency 04/24/2007   Anemia 04/24/2007   Essential hypertension 04/24/2007    PCP: Garald [name], MD  REFERRING PROVIDER: Leonce Katz, DO  REFERRING DIAG: Chronic left shoulder pain; Impingement syndrome of shoulder region, left  THERAPY DIAG:  Chronic left shoulder pain  Muscle weakness (generalized)  Rationale for Evaluation and Treatment: Rehabilitation  ONSET DATE: Chronic   SUBJECTIVE SUBJECTIVE STATEMENT: Patient reports her shoulder has been feeling good the past few  weeks but she woke up and it was sore this morning.   Eval: Patient reports left shoulder pain that has been there for months. She does remember how it happened. She did have an injection which has helped. She reports that moving the left shoulder or trying to reach behind her back causes pain, and when lying on the left shoulder it hurts. She denies any pain at rest.   Hand dominance: Right  PERTINENT HISTORY: See PMH above  PAIN:  Are you having pain? Yes:  NPRS scale: 0/10 at rest, 4/10 with shoulder movement Pain location: Left shoulder  Pain description: Dull achy Aggravating factors: Moving the left shoulder, reaching overhead or behind back Relieving factors: Rest  PRECAUTIONS: None  PATIENT GOALS: Pain relief   OBJECTIVE:  Note: Objective measures were completed at Evaluation unless otherwise noted. PATIENT SURVEYS:  QuickDASH: 27.3% disability  POSTURE: Rounded shoulder posture  UPPER EXTREMITY ROM:   Active ROM Right eval Left eval Left 07/02/2024  Shoulder flexion 145 125 140  Shoulder extension     Shoulder abduction 130 95   Shoulder adduction     Shoulder internal rotation T8 T12   Shoulder external rotation 65 / T2 50 / C7   Elbow flexion     Elbow extension     Wrist flexion     Wrist extension     Wrist ulnar deviation     Wrist radial deviation     Wrist pronation     Wrist supination     (Blank rows = not tested)  UPPER EXTREMITY MMT:  MMT Right eval Left eval  Shoulder flexion 5 4  Shoulder extension    Shoulder abduction 5 4  Shoulder adduction    Shoulder internal rotation 5 4+  Shoulder external rotation 5 4  Middle trapezius    Lower trapezius    Elbow flexion    Elbow extension    Wrist flexion    Wrist extension    Wrist ulnar deviation    Wrist radial deviation    Wrist pronation    Wrist supination    Grip strength (lbs)    (Blank rows = not tested)  JOINT MOBILITY TESTING:  Hypomobility noted left  shoulder  PALPATION:  Tender to palpation left periacromial region  TREATMENT [facility] Adult PT Treatment:                                                DATE: 07/02/2024 UBE L2 x 4 min (fwd/bwd) to improve endurance and workload capacity Supine dowel shoulder flexion 2 x 10 Supine horizontal abduction with yellow 2 x 10 Sidelying shoulder abduction palm down with 2# 2 x 10 Sidelying shoulder ER with 2# 2 x 10 Row with blue 2 x 10 Standing shoulder extension with yellow 2 x 10 Standing shoulder flexion with 2# 2 x 10 Standing shoulder abduction palm down with 2# 2 x 10 Standing wall slide for shoulder elevation x 10 Standing double ER with yellow 2 x 10  PATIENT EDUCATION: Education details: HEP Person educated: Patient Education method: Programmer, Multimedia, Demonstration, Actor cues, Verbal cues Education comprehension: verbalized understanding, returned demonstration, verbal cues required, tactile cues required, and needs further education  HOME EXERCISE PROGRAM: Access Code: [email or web address]    ASSESSMENT: CLINICAL IMPRESSION: Patient tolerated therapy well with no adverse effects. Therapy focused on progress left shoulder motion and strength. She was able to progress with her weighted and banded strengthening for the left rotator cuff and periscapular musculature, and demonstrates improvement her left shoulder active elevation. No changes made to her HEP this visit. Patient would benefit from continued skilled PT to progress mobility and strength in order to reduce pain and maximize functional ability.   Eval: Patient is a 74 y.o. female who was seen today for physical therapy evaluation and treatment for chronic left shoulder pain. Her left shoulder symptoms seem consistent with rotator cuff related pain. She demonstrates limitations in her left shoulder active motion and  strength, with pain while performing active movement or lifting type tasks with the left shoulder.  OBJECTIVE IMPAIRMENTS: decreased activity tolerance, decreased ROM, decreased strength, postural dysfunction, and pain.   ACTIVITY LIMITATIONS: carrying, lifting, bathing, dressing, reach over head, and hygiene/grooming  PARTICIPATION LIMITATIONS: meal prep, cleaning, laundry, driving, and shopping  PERSONAL FACTORS: Fitness and Time since onset of injury/illness/exacerbation are also affecting patient's functional outcome.    GOALS: Goals reviewed with patient? Yes  SHORT TERM GOALS: Target date: 07/18/2024  Patient will be I with initial HEP in order to progress with therapy. Baseline: HEP provided at eval Goal status: INITIAL  2.  Patient will report left shoulder pain with movement and activity </= 2/10 in order to reduce functional limitations Baseline: 4/10 Goal status: INITIAL   LONG TERM GOALS: Target date: 08/15/2024  Patient will be I with final HEP to maintain progress from PT. Baseline: HEP provided at eval Goal status: INITIAL  2.  Patient will report QuickDASH </= 10% in order to indicate an improvement in their functional status Baseline: 27.3% Goal status: INITIAL  3.  Patient will demonstrate left shoulder strength 5/5 MMT in order to improve activity tolerance and reduce pain with shoulder movement Baseline: see limitations above Goal status: INITIAL  4.  Patient will demonstrate left shoulder flexion AROM >/= 140 deg and functional IR reach behind back >/= T10 in order to improve ability to perform self care tasks Baseline: see limitations above Goal status: INITIAL   PLAN: PT FREQUENCY: 1x/week  PT DURATION: 8 weeks  PLANNED INTERVENTIONS: 97164- PT Re-evaluation, 97750- Physical Performance Testing, 97110-Therapeutic exercises, 97530- Therapeutic activity, [id number]- Neuromuscular re-education, 97535- Self Care, 02859- Manual therapy,  Patient/Family  education, Joint mobilization, Joint manipulation, Spinal manipulation, Spinal mobilization, Cryotherapy, and Moist heat  PLAN FOR NEXT SESSION: Review HEP and progress PRN, manual for left shoulder mobility, progress left shoulder AAROM>AROM and rotator cuff strengthening, postural strengthening   Elaine Daring, PT, DPT, LAT, ATC 07/02/2024  12:31 PM Phone: [phone] Fax: [phone]  "

## 2024-07-09 ENCOUNTER — Encounter: Payer: Self-pay | Admitting: Internal Medicine

## 2024-07-09 ENCOUNTER — Ambulatory Visit: Admitting: Internal Medicine

## 2024-07-09 ENCOUNTER — Other Ambulatory Visit: Payer: Self-pay

## 2024-07-09 ENCOUNTER — Ambulatory Visit (INDEPENDENT_AMBULATORY_CARE_PROVIDER_SITE_OTHER): Admitting: Physical Therapy

## 2024-07-09 ENCOUNTER — Other Ambulatory Visit: Payer: Self-pay | Admitting: Internal Medicine

## 2024-07-09 ENCOUNTER — Encounter: Payer: Self-pay | Admitting: Physical Therapy

## 2024-07-09 VITALS — BP 122/82 | HR 83 | Temp 98.1°F | Ht 60.0 in | Wt 195.0 lb

## 2024-07-09 DIAGNOSIS — R053 Chronic cough: Secondary | ICD-10-CM | POA: Diagnosis not present

## 2024-07-09 DIAGNOSIS — G8929 Other chronic pain: Secondary | ICD-10-CM

## 2024-07-09 DIAGNOSIS — M25512 Pain in left shoulder: Secondary | ICD-10-CM

## 2024-07-09 DIAGNOSIS — M6281 Muscle weakness (generalized): Secondary | ICD-10-CM

## 2024-07-09 DIAGNOSIS — R252 Cramp and spasm: Secondary | ICD-10-CM

## 2024-07-09 DIAGNOSIS — J479 Bronchiectasis, uncomplicated: Secondary | ICD-10-CM

## 2024-07-09 DIAGNOSIS — Z0000 Encounter for general adult medical examination without abnormal findings: Secondary | ICD-10-CM

## 2024-07-09 MED ORDER — HYDROCODONE BIT-HOMATROP MBR 5-1.5 MG/5ML PO SOLN: 5.0000 mL | Freq: Four times a day (QID) | ORAL | 0 refills | Status: AC | PRN

## 2024-07-09 MED ORDER — TIRZEPATIDE 12.5 MG/0.5ML ~~LOC~~ SOAJ: 12.5000 mg | SUBCUTANEOUS | 5 refills | Status: AC

## 2024-07-09 MED ORDER — APIXABAN 2.5 MG PO TABS: 2.5000 mg | ORAL_TABLET | Freq: Two times a day (BID) | ORAL | 2 refills | Status: AC

## 2024-07-09 MED ORDER — BUDESONIDE-FORMOTEROL FUMARATE 80-4.5 MCG/ACT IN AERO: 2.0000 | INHALATION_SPRAY | Freq: Two times a day (BID) | RESPIRATORY_TRACT | 11 refills | Status: AC

## 2024-07-09 MED ORDER — CEFDINIR 300 MG PO CAPS: 300.0000 mg | ORAL_CAPSULE | Freq: Two times a day (BID) | ORAL | 0 refills | Status: DC

## 2024-07-09 NOTE — Assessment & Plan Note (Signed)
 Wose Hold Pravastatin 

## 2024-07-09 NOTE — Assessment & Plan Note (Signed)
 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 They make cardboard boxes. Dx - bronchiectases - Dr Jude On Symbicort  Hycodan prn High Frequency Chest Wall Oscillation (HFCWO) vest Rx was given

## 2024-07-09 NOTE — Patient Instructions (Signed)
 Pravastatin  can cause cramps

## 2024-07-09 NOTE — Patient Instructions (Signed)
 Access Code: [email or web address] URL: [email or web address] Date: 07/09/2024 Prepared by: Elaine Daring  Exercises - Supine Shoulder Flexion Extension AAROM with Dowel  - 1 x daily - 2 sets - 10 reps - Standing shoulder flexion wall slides  - 2 x daily - 10 reps - Shoulder External Rotation with Anchored Resistance  - 1 x daily - 2 sets - 10 reps - Scaption with Dumbbells  - 1 x daily - 2 sets - 10 reps

## 2024-07-09 NOTE — Therapy (Signed)
 " OUTPATIENT PHYSICAL THERAPY TREATMENT   Patient Name: Sierra Johnson MRN: [id number] DOB:1950/11/03, 74 y.o., female Today's Date: 07/09/2024   END OF SESSION:  PT End of Session - 07/09/24 1229     Visit Number 3    Number of Visits 9    Date for Recertification  08/15/24    Authorization Type MCR    Progress Note Due on Visit 10    PT Start Time 1147    PT Stop Time 1226    PT Time Calculation (min) 39 min    Activity Tolerance Patient tolerated treatment well    Behavior During Therapy [facility] for tasks assessed/performed            Past Medical History:  Diagnosis Date   ANEMIA-NOS 04/24/2007   Arthritis    Blood clot in vein    Cough    DIABETES MELLITUS, TYPE II 04/24/2007   Elevated glucose 2011   HYPERLIPIDEMIA 07/17/2007   mild   HYPERTENSION 04/24/2007   Knee pain    Menopause 1998   Dr. Curlene   OBSTRUCTIVE SLEEP APNEA 09/30/2009   on CPAP 2011   Pneumonia    chronic Brochitis and use inhaler   VITAMIN B12 DEFICIENCY 04/24/2007   Past Surgical History:  Procedure Laterality Date   ABDOMINAL HYSTERECTOMY     HERNIA REPAIR  04/2010   Incar. midline hernia   INCISIONAL HERNIA REPAIR N/A 06/03/2014   Procedure: LAPAROSCOPIC REPAIR RECURRENT  INCISIONAL HERNIA;  Surgeon: Dann Hummer, MD;  Location: [facility] OR;  Service: General;  Laterality: N/A;   INSERTION OF MESH N/A 06/03/2014   Procedure: INSERTION OF MESH;  Surgeon: Dann Hummer, MD;  Location: [facility] OR;  Service: General;  Laterality: N/A;   TONSILLECTOMY     TOTAL KNEE ARTHROPLASTY Right 01/27/2019   Procedure: TOTAL KNEE ARTHROPLASTY;  Surgeon: Melodi Lerner, MD;  Location: [facility] ORS;  Service: Orthopedics;  Laterality: Right;    TOTAL KNEE ARTHROPLASTY Left 08/15/2021   Procedure: TOTAL KNEE ARTHROPLASTY;  Surgeon: Melodi Lerner, MD;  Location: [facility] OR[facility];  Service: Orthopedics;  Laterality: Left;   WISDOM TOOTH EXTRACTION     Patient Active Problem List   Diagnosis Date Noted   Cramps, extremity  07/09/2024   Chronic anticoagulation 04/14/2024   Shoulder pain, left 02/20/2024   Abdominal pain 02/20/2024   Hyperlipidemia 05/23/2022   Primary osteoarthritis of left knee 08/15/2021   Decreased GFR 05/12/2020   Bronchiectasis without complication (HCC) 12/15/2019   Renal cyst 12/15/2019   CKD (chronic kidney disease) stage 3, GFR 30-59 ml/min (HCC) 11/28/2019   Arthritis 10/07/2019   History of total knee replacement, right 04/07/2019   History of community acquired pneumonia 11/29/2018   Sleep difficulties 11/29/2018   Preop exam for internal medicine 08/19/2018   Allergic rhinitis 08/19/2018   Hypoxemia 03/05/2018   History of pulmonary embolus (PE) 02/28/2018   Hyponatremia 02/28/2018   Chronic cough 02/21/2018   Headache 01/30/2018   Asthmatic bronchitis 01/18/2018   Insomnia 03/08/2016   Eczema 04/13/2015   Edema 02/11/2015   S/P laparoscopic hernia repair 06/03/2014   OA (osteoarthritis) of knee 04/30/2014   Osteoarthritis of left knee 04/30/2014   Recurrent ventral incisional hernia 10/16/2012   S/P repair of ventral hernia 08/28/2012   Type 2 diabetes mellitus in patient with obesity (HCC) 05/07/2012   Obesity, Class III, BMI 40-49.9 (morbid obesity) (HCC) 01/18/2012   Well adult exam 04/17/2011   Sinusitis, acute 04/17/2011   SHOULDER PAIN 12/07/2009  POLYURIA 12/07/2009   Obstructive sleep apnea 09/30/2009   FATIGUE 09/15/2009   PARESTHESIA 06/08/2008   Dyslipidemia 07/17/2007   B12 deficiency 04/24/2007   Anemia 04/24/2007   Essential hypertension 04/24/2007    PCP: Plotnikov, [name], MD  REFERRING PROVIDER: Leonce Katz, DO  REFERRING DIAG: Chronic left shoulder pain; Impingement syndrome of shoulder region, left  THERAPY DIAG:  Chronic left shoulder pain  Muscle weakness (generalized)  Rationale for Evaluation and Treatment: Rehabilitation  ONSET DATE: Chronic   SUBJECTIVE SUBJECTIVE STATEMENT: Patient reports she is doing well  with no new issues.   Eval: Patient reports left shoulder pain that has been there for months. She does remember how it happened. She did have an injection which has helped. She reports that moving the left shoulder or trying to reach behind her back causes pain, and when lying on the left shoulder it hurts. She denies any pain at rest.   Hand dominance: Right  PERTINENT HISTORY: See PMH above  PAIN:  Are you having pain? Yes:  NPRS scale: 0/10 at rest, 4/10 with shoulder movement Pain location: Left shoulder  Pain description: Dull achy Aggravating factors: Moving the left shoulder, reaching overhead or behind back Relieving factors: Rest  PRECAUTIONS: None  PATIENT GOALS: Pain relief   OBJECTIVE:  Note: Objective measures were completed at Evaluation unless otherwise noted. PATIENT SURVEYS:  QuickDASH: 27.3% disability  POSTURE: Rounded shoulder posture  UPPER EXTREMITY ROM:   Active ROM Right eval Left eval Left 07/02/2024  Shoulder flexion 145 125 140  Shoulder extension     Shoulder abduction 130 95   Shoulder adduction     Shoulder internal rotation T8 T12   Shoulder external rotation 65 / T2 50 / C7   Elbow flexion     Elbow extension     Wrist flexion     Wrist extension     Wrist ulnar deviation     Wrist radial deviation     Wrist pronation     Wrist supination     (Blank rows = not tested)  UPPER EXTREMITY MMT:  MMT Right eval Left eval  Shoulder flexion 5 4  Shoulder extension    Shoulder abduction 5 4  Shoulder adduction    Shoulder internal rotation 5 4+  Shoulder external rotation 5 4  Middle trapezius    Lower trapezius    Elbow flexion    Elbow extension    Wrist flexion    Wrist extension    Wrist ulnar deviation    Wrist radial deviation    Wrist pronation    Wrist supination    Grip strength (lbs)    (Blank rows = not tested)  JOINT MOBILITY TESTING:  Hypomobility noted left shoulder  PALPATION:  Tender to palpation  left periacromial region  TREATMENT [facility] Adult PT Treatment:                                                DATE: 07/09/2024 UBE L2 x 4 min (fwd/bwd) to improve endurance and workload capacity Left GHJ mobs primarily inferior and posterior at various ranges of elevation Supine dowel shoulder flexion x 10 Sidelying shoulder abduction palm down with 2# 3 x 10 Row with blue 3 x 10 Standing banded ER with red 3 x 10 Standing scaption with 2# 2 x 10 Standing shoulder abduction palm down with 2# 2 x 10  PATIENT EDUCATION: Education details: HEP update Person educated: Patient Education method: Explanation, Demonstration, Tactile cues, Verbal cues, Handout Education comprehension: verbalized understanding, returned demonstration, verbal cues required, tactile cues required, and needs further education  HOME EXERCISE PROGRAM: Access Code: [email or web address]    ASSESSMENT: CLINICAL IMPRESSION: Patient tolerated therapy well with no adverse effects. Therapy focused on improving left shoulder mobility and progressing left rotator cuff strengthening and control with good tolerance. She was able to progress with her strengthening and control exercises this visit without any reported increased pain, but did not muscular fatigue following therapy. Updated her HEP to progress strengthening for home. Patient would benefit from continued skilled PT to progress mobility and strength in order to reduce pain and maximize functional ability.   Eval: Patient is a 74 y.o. female who was seen today for physical therapy evaluation and treatment for chronic left shoulder pain. Her left shoulder symptoms seem consistent with rotator cuff related pain. She demonstrates limitations in her left shoulder active motion and strength, with pain while performing active movement or lifting type tasks with the left  shoulder.  OBJECTIVE IMPAIRMENTS: decreased activity tolerance, decreased ROM, decreased strength, postural dysfunction, and pain.   ACTIVITY LIMITATIONS: carrying, lifting, bathing, dressing, reach over head, and hygiene/grooming  PARTICIPATION LIMITATIONS: meal prep, cleaning, laundry, driving, and shopping  PERSONAL FACTORS: Fitness and Time since onset of injury/illness/exacerbation are also affecting patient's functional outcome.    GOALS: Goals reviewed with patient? Yes  SHORT TERM GOALS: Target date: 07/18/2024  Patient will be I with initial HEP in order to progress with therapy. Baseline: HEP provided at eval Goal status: INITIAL  2.  Patient will report left shoulder pain with movement and activity </= 2/10 in order to reduce functional limitations Baseline: 4/10 Goal status: INITIAL   LONG TERM GOALS: Target date: 08/15/2024  Patient will be I with final HEP to maintain progress from PT. Baseline: HEP provided at eval Goal status: INITIAL  2.  Patient will report QuickDASH </= 10% in order to indicate an improvement in their functional status Baseline: 27.3% Goal status: INITIAL  3.  Patient will demonstrate left shoulder strength 5/5 MMT in order to improve activity tolerance and reduce pain with shoulder movement Baseline: see limitations above Goal status: INITIAL  4.  Patient will demonstrate left shoulder flexion AROM >/= 140 deg and functional IR reach behind back >/= T10 in order to improve ability to perform self care tasks Baseline: see limitations above Goal status: INITIAL   PLAN: PT FREQUENCY: 1x/week  PT DURATION: 8 weeks  PLANNED INTERVENTIONS: 97164- PT Re-evaluation, 97750- Physical Performance Testing, 97110-Therapeutic exercises, 97530- Therapeutic activity, [id number]- Neuromuscular re-education, 97535- Self Care, [id number]- Manual therapy, Patient/Family education, Joint mobilization, Joint manipulation, Spinal manipulation, Spinal mobilization,  Cryotherapy, and  Moist heat  PLAN FOR NEXT SESSION: Review HEP and progress PRN, manual for left shoulder mobility, progress left shoulder AAROM>AROM and rotator cuff strengthening, postural strengthening   Elaine Daring, PT, DPT, LAT, ATC 07/09/24  12:37 PM Phone: [phone] Fax: [phone]  "

## 2024-07-09 NOTE — Assessment & Plan Note (Signed)
 We discussed age appropriate health related issues, including available/recomended screening tests and vaccinations. We discussed a need for adhering to healthy di et and exercise. Labs were ordered. All questions were answered. She is a vegeterian. Colon 2008, 2020; due in 2030 Cologuard 2018 Shingrix , pneumovax discussed GYN and mammo yearly Prevnar, tDAP 2020

## 2024-07-09 NOTE — Assessment & Plan Note (Addendum)
 Cefdinir  for travel F/u w/Dr Jude High Frequency Chest Wall Oscillation (HFCWO) vest Rx was given

## 2024-07-09 NOTE — Progress Notes (Signed)
 "  Subjective:  Patient ID: Sierra Johnson, female    DOB: 31-Aug-1950  Age: 74 y.o. MRN: [id number]  CC: Annual Exam   HPI Sierra Johnson presents for a well exam Cough since 2019. Using Symbicort  C/o cramps in legs, hands Recent labs reviewed  Outpatient Medications Prior to Visit  Medication Sig Dispense Refill   Accu-Chek FastClix Lancets MISC USE TO CHECK BLOOD SUGARS TWICE A DAY 102 each 0   albuterol  (VENTOLIN  HFA) 108 (90 Base) MCG/ACT inhaler Inhale 2 puffs into the lungs every 6 (six) hours as needed for wheezing or shortness of breath.     apixaban  (ELIQUIS ) 5 MG TABS tablet TAKE 1/2 TABLET (2.5 MG) 2 TIMES A DAY 60 tablet 5   Blood Glucose Monitoring Suppl (ACCU-CHEK GUIDE) w/Device KIT 1 each by Does not apply route 2 (two) times daily. DX: E11.9 1 kit 0   Cholecalciferol  25 MCG (1000 UT) capsule 1,000 Units.     Cyanocobalamin  (B-12) 5000 MCG SUBL 1 sublingual daily 100 tablet 11   Docusate Sodium  (DSS) 100 MG CAPS      esomeprazole  (NEXIUM ) 40 MG capsule Take by mouth.     glucose blood (ACCU-CHEK GUIDE) test strip UUSE TO CHECK BLOOD SUGARS TWICE A DAY 200 strip 1   ipratropium (ATROVENT ) 0.03 % nasal spray Place 2 sprays into the nose 2 (two) times daily.     levofloxacin  (LEVAQUIN ) 500 MG tablet Take 1 tablet (500 mg total) by mouth daily. 7 tablet 0   methylPREDNISolone  (MEDROL  DOSEPAK) 4 MG TBPK tablet As directed 21 tablet 0   montelukast  (SINGULAIR ) 10 MG tablet TAKE 1 TABLET BY MOUTH EVERY DAY 90 tablet 2   olmesartan  (BENICAR ) 20 MG tablet TAKE 1 TABLET BY MOUTH EVERY DAY 90 tablet 3   ondansetron  (ZOFRAN ) 4 MG tablet Take 1 tablet (4 mg total) by mouth every 8 (eight) hours as needed. 20 tablet 0   oxymetazoline  (AFRIN NASAL SPRAY) 0.05 % nasal spray Place 1 spray into both nostrils 2 (two) times daily as needed for congestion. 30 mL 1   pravastatin  (PRAVACHOL ) 20 MG tablet Take 1 tablet (20 mg total) by mouth daily. 90 tablet 3   sodium chloride  0.9 %  nebulizer solution Take 3 mLs by nebulization as needed for wheezing. 90 mL 12   budesonide -formoterol  (SYMBICORT ) 80-4.5 MCG/ACT inhaler Inhale 2 puffs into the lungs 2 (two) times daily. 1 each 3   tirzepatide  (MOUNJARO ) 12.5 MG/0.5ML Pen Inject 12.5 mg into the skin once a week. 2 mL 5   No facility-administered medications prior to visit.    ROS: Review of Systems  Constitutional:  Negative for activity change, appetite change, chills, fatigue and unexpected weight change.  HENT:  Negative for congestion, mouth sores and sinus pressure.   Eyes:  Negative for visual disturbance.  Respiratory:  Positive for cough. Negative for chest tightness and wheezing.   Gastrointestinal:  Negative for abdominal pain and nausea.  Genitourinary:  Negative for difficulty urinating, frequency and vaginal pain.  Musculoskeletal:  Positive for arthralgias and gait problem.  Skin:  Negative for pallor and rash.  Neurological:  Negative for dizziness, tremors, weakness, numbness and headaches.  Psychiatric/Behavioral:  Negative for confusion, sleep disturbance and suicidal ideas.     Objective:  BP 122/82 (BP Location: Left Arm, Patient Position: Sitting, Cuff Size: Normal)   Pulse 83   Temp 98.1 F (36.7 C) (Oral)   Ht 5' (1.524 m)   Wt 195 lb (88.5  kg)   SpO2 96%   BMI 38.08 kg/m   BP Readings from Last 3 Encounters:  07/09/24 122/82  05/21/24 120/70  02/20/24 110/60    Wt Readings from Last 3 Encounters:  07/09/24 195 lb (88.5 kg)  06/09/24 197 lb (89.4 kg)  05/21/24 197 lb (89.4 kg)    Physical Exam Constitutional:      General: She is not in acute distress.    Appearance: She is well-developed.  HENT:     Head: Normocephalic.     Right Ear: External ear normal.     Left Ear: External ear normal.     Nose: Nose normal.  Eyes:     General:        Right eye: No discharge.        Left eye: No discharge.     Conjunctiva/sclera: Conjunctivae normal.     Pupils: Pupils are  equal, round, and reactive to light.  Neck:     Thyroid : No thyromegaly.     Vascular: No JVD.     Trachea: No tracheal deviation.  Cardiovascular:     Rate and Rhythm: Normal rate and regular rhythm.     Heart sounds: Normal heart sounds.  Pulmonary:     Effort: No respiratory distress.     Breath sounds: No stridor. No wheezing.  Abdominal:     General: Bowel sounds are normal. There is no distension.     Palpations: Abdomen is soft. There is no mass.     Tenderness: There is no abdominal tenderness. There is no guarding or rebound.  Musculoskeletal:        General: No tenderness.     Cervical back: Normal range of motion and neck supple. No rigidity.     Right lower leg: No edema.     Left lower leg: No edema.  Lymphadenopathy:     Cervical: No cervical adenopathy.  Skin:    Findings: No erythema or rash.  Neurological:     Mental Status: She is oriented to person, place, and time.     Cranial Nerves: No cranial nerve deficit.     Motor: No abnormal muscle tone.     Coordination: Coordination normal.     Deep Tendon Reflexes: Reflexes normal.  Psychiatric:        Behavior: Behavior normal.        Thought Content: Thought content normal.        Judgment: Judgment normal.     Lab Results  Component Value Date   WBC 6.6 02/20/2024   HGB 12.5 02/20/2024   HCT 37.6 02/20/2024   PLT 302.0 02/20/2024   GLUCOSE 74 06/09/2024   CHOL 207 (H) 06/09/2024   TRIG 56.0 06/09/2024   HDL 80.00 06/09/2024   LDLDIRECT 119.5 08/27/2012   LDLCALC 115 (H) 06/09/2024   ALT 20 06/09/2024   AST 23 06/09/2024   NA 133 (L) 06/09/2024   K 4.5 06/09/2024   CL 99 06/09/2024   CREATININE 1.18 06/09/2024   BUN 10 06/09/2024   CO2 27 06/09/2024   TSH 1.29 07/06/2023   INR 1.1 08/03/2021   HGBA1C 5.8 06/09/2024    CT Chest High Resolution Result Date: 01/27/2024 CLINICAL DATA:  Bronchiectasis, chronic cough EXAM: CT CHEST WITHOUT CONTRAST TECHNIQUE: Multidetector CT imaging of the  chest was performed following the standard protocol without intravenous contrast. High resolution imaging of the lungs, as well as inspiratory and expiratory imaging, was performed. RADIATION DOSE REDUCTION: This exam was performed  according to the departmental dose-optimization program which includes automated exposure control, adjustment of the mA and/or kV according to patient size and/or use of iterative reconstruction technique. COMPARISON:  04/11/2022 FINDINGS: Cardiovascular: No significant vascular findings. Normal heart size. No pericardial effusion. Mediastinum/Nodes: No enlarged mediastinal, hilar, or axillary lymph nodes. Thyroid  gland, trachea, and esophagus demonstrate no significant findings. Lungs/Pleura: Bandlike scarring of the bilateral lung bases with associated clustered centrilobular nodularity and tubular bronchiectasis, appearance unchanged compared to prior examination dated 04/11/2022 (series 4, image 88). Lobular air trapping on expiratory phase imaging. No pleural effusion or pneumothorax. Upper Abdomen: No acute abnormality. Musculoskeletal: No chest wall abnormality. No acute osseous findings. IMPRESSION: 1. Bandlike scarring of the bilateral lung bases with associated clustered centrilobular nodularity and tubular bronchiectasis, appearance unchanged compared to prior examination dated 04/11/2022. Findings are consistent with chronic sequelae of infection or aspiration, which again may be ongoing. 2. Lobular air trapping on expiratory phase imaging, consistent with small airways disease. Electronically Signed   By: Marolyn JONETTA Jaksch M.D.   On: 01/27/2024 15:47    Assessment & Plan:   Problem List Items Addressed This Visit     Well adult exam - Primary   We discussed age appropriate health related issues, including available/recomended screening tests and vaccinations. We discussed a need for adhering to healthy di et and exercise. Labs were ordered. All questions were answered.  She is a vegeterian. 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 They make cardboard boxes. Dx - bronchiectases - Dr Jude On Symbicort  Hycodan prn High Frequency Chest Wall Oscillation (HFCWO) vest Rx was given       Bronchiectasis without complication (HCC)   Cefdinir  for travel F/u w/Dr Jude High Frequency Chest Wall Oscillation (HFCWO) vest Rx was given      Cramps, extremity   Wose Hold Pravastatin          Meds ordered this encounter  Medications   budesonide -formoterol  (SYMBICORT ) 80-4.5 MCG/ACT inhaler    Sig: Inhale 2 puffs into the lungs 2 (two) times daily.    Dispense:  1 each    Refill:  11   tirzepatide  (MOUNJARO ) 12.5 MG/0.5ML Pen    Sig: Inject 12.5 mg into the skin once a week.    Dispense:  2 mL    Refill:  5   HYDROcodone  bit-homatropine (HYCODAN) 5-1.5 MG/5ML syrup    Sig: Take 5 mLs by mouth every 6 (six) hours as needed for cough.    Dispense:  240 mL    Refill:  0   apixaban  (ELIQUIS ) 2.5 MG TABS tablet    Sig: Take 1 tablet (2.5 mg total) by mouth 2 (two) times daily.    Dispense:  60 tablet    Refill:  2   cefdinir  (OMNICEF ) 300 MG capsule    Sig: Take 1 capsule (300 mg total) by mouth 2 (two) times daily.    Dispense:  20 capsule    Refill:  0      Follow-up: Return in about 6 months (around 01/06/2025) for a follow-up visit.  Marolyn Noel, MD "

## 2024-07-15 ENCOUNTER — Telehealth: Payer: Self-pay

## 2024-07-15 ENCOUNTER — Other Ambulatory Visit (HOSPITAL_COMMUNITY): Payer: Self-pay

## 2024-07-15 NOTE — Telephone Encounter (Signed)
 Pharmacy Patient Advocate Encounter  Received notification from [facility] that Prior Authorization for Mounjaro  has been APPROVED from 07/15/2024 to 06/18/2098. Ran test claim, Copay is $332.08. This test claim was processed through [facility]- copay amounts may vary at other pharmacies due to pharmacy/plan contracts, or as the patient moves through the different stages of their insurance plan.  PT. Has deductibe to meet   PA #/Case ID/Reference #: [id number]

## 2024-07-15 NOTE — Telephone Encounter (Signed)
 Pharmacy Patient Advocate Encounter   Received notification from [facility] that prior authorization for Mounjaro  is required/requested.   Insurance verification completed.   The patient is insured through [facility] MEDICAID.   Per test claim: PA required; PA submitted to above mentioned insurance via Latent Key/confirmation #/EOC [id number] Status is pending

## 2024-07-16 ENCOUNTER — Encounter: Admitting: Physical Therapy

## 2024-07-16 ENCOUNTER — Ambulatory Visit: Payer: Medicare Other

## 2024-07-21 ENCOUNTER — Other Ambulatory Visit (HOSPITAL_COMMUNITY): Payer: Self-pay

## 2024-07-22 ENCOUNTER — Encounter: Payer: Self-pay | Admitting: Internal Medicine

## 2024-07-23 ENCOUNTER — Other Ambulatory Visit: Payer: Self-pay

## 2024-07-23 ENCOUNTER — Encounter: Payer: Self-pay | Admitting: Physical Therapy

## 2024-07-23 ENCOUNTER — Ambulatory Visit (INDEPENDENT_AMBULATORY_CARE_PROVIDER_SITE_OTHER): Admitting: Physical Therapy

## 2024-07-23 ENCOUNTER — Other Ambulatory Visit: Payer: Self-pay | Admitting: Internal Medicine

## 2024-07-23 DIAGNOSIS — G8929 Other chronic pain: Secondary | ICD-10-CM

## 2024-07-23 DIAGNOSIS — M6281 Muscle weakness (generalized): Secondary | ICD-10-CM | POA: Diagnosis not present

## 2024-07-23 DIAGNOSIS — M25512 Pain in left shoulder: Secondary | ICD-10-CM

## 2024-07-23 MED ORDER — CEFDINIR 300 MG PO CAPS: 300.0000 mg | ORAL_CAPSULE | Freq: Two times a day (BID) | ORAL | 0 refills | Status: AC

## 2024-07-23 NOTE — Patient Instructions (Signed)
 Access Code: [email or web address] URL: [email or web address] Date: 07/23/2024 Prepared by: Elaine Daring  Exercises - Supine Shoulder Flexion Extension AAROM with Dowel  - 3 sets - 10 reps - Standing shoulder flexion wall slides  - 3 x daily - 10 reps - Shoulder External Rotation with Anchored Resistance  - 3 sets - 10 reps - Scaption with Dumbbells  - 3 sets - 10 reps - Standing Row with Anchored Resistance  - 3 sets - 10 reps

## 2024-07-23 NOTE — Therapy (Signed)
 " OUTPATIENT PHYSICAL THERAPY TREATMENT   Patient Name: Sierra Johnson MRN: [id number] DOB:09/06/1950, 74 y.o., female Today's Date: 07/23/2024   END OF SESSION:  PT End of Session - 07/23/24 1210     Visit Number 4    Number of Visits 9    Date for Recertification  08/15/24    Authorization Type MCR    Progress Note Due on Visit 10    PT Start Time 1147    PT Stop Time 1228    PT Time Calculation (min) 41 min    Activity Tolerance Patient tolerated treatment well    Behavior During Therapy [facility] for tasks assessed/performed             Past Medical History:  Diagnosis Date   ANEMIA-NOS 04/24/2007   Arthritis    Blood clot in vein    Cough    DIABETES MELLITUS, TYPE II 04/24/2007   Elevated glucose 2011   HYPERLIPIDEMIA 07/17/2007   mild   HYPERTENSION 04/24/2007   Knee pain    Menopause 1998   Dr. Curlene   OBSTRUCTIVE SLEEP APNEA 09/30/2009   on CPAP 2011   Pneumonia    chronic Brochitis and use inhaler   VITAMIN B12 DEFICIENCY 04/24/2007   Past Surgical History:  Procedure Laterality Date   ABDOMINAL HYSTERECTOMY     HERNIA REPAIR  04/2010   Incar. midline hernia   INCISIONAL HERNIA REPAIR N/A 06/03/2014   Procedure: LAPAROSCOPIC REPAIR RECURRENT  INCISIONAL HERNIA;  Surgeon: Dann Hummer, MD;  Location: [facility] OR;  Service: General;  Laterality: N/A;   INSERTION OF MESH N/A 06/03/2014   Procedure: INSERTION OF MESH;  Surgeon: Dann Hummer, MD;  Location: [facility] OR;  Service: General;  Laterality: N/A;   TONSILLECTOMY     TOTAL KNEE ARTHROPLASTY Right 01/27/2019   Procedure: TOTAL KNEE ARTHROPLASTY;  Surgeon: Melodi Lerner, MD;  Location: [facility] OR[facility];  Service: Orthopedics;  Laterality: Right;    TOTAL KNEE ARTHROPLASTY Left 08/15/2021   Procedure: TOTAL KNEE ARTHROPLASTY;  Surgeon: Melodi Lerner, MD;  Location: [facility];  Service: Orthopedics;  Laterality: Left;   WISDOM TOOTH EXTRACTION     Patient Active Problem List   Diagnosis Date Noted   Cramps,  extremity 07/09/2024   Chronic anticoagulation 04/14/2024   Shoulder pain, left 02/20/2024   Abdominal pain 02/20/2024   Hyperlipidemia 05/23/2022   Primary osteoarthritis of left knee 08/15/2021   Decreased GFR 05/12/2020   Bronchiectasis without complication (HCC) 12/15/2019   Renal cyst 12/15/2019   CKD (chronic kidney disease) stage 3, GFR 30-59 ml/min (HCC) 11/28/2019   Arthritis 10/07/2019   History of total knee replacement, right 04/07/2019   History of community acquired pneumonia 11/29/2018   Sleep difficulties 11/29/2018   Preop exam for internal medicine 08/19/2018   Allergic rhinitis 08/19/2018   Hypoxemia 03/05/2018   History of pulmonary embolus (PE) 02/28/2018   Hyponatremia 02/28/2018   Chronic cough 02/21/2018   Headache 01/30/2018   Asthmatic bronchitis 01/18/2018   Insomnia 03/08/2016   Eczema 04/13/2015   Edema 02/11/2015   S/P laparoscopic hernia repair 06/03/2014   OA (osteoarthritis) of knee 04/30/2014   Osteoarthritis of left knee 04/30/2014   Recurrent ventral incisional hernia 10/16/2012   S/P repair of ventral hernia 08/28/2012   Type 2 diabetes mellitus in patient with obesity (HCC) 05/07/2012   Obesity, Class III, BMI 40-49.9 (morbid obesity) (HCC) 01/18/2012   Well adult exam 04/17/2011   Sinusitis, acute 04/17/2011   SHOULDER PAIN 12/07/2009  POLYURIA 12/07/2009   Obstructive sleep apnea 09/30/2009   FATIGUE 09/15/2009   PARESTHESIA 06/08/2008   Dyslipidemia 07/17/2007   B12 deficiency 04/24/2007   Anemia 04/24/2007   Essential hypertension 04/24/2007    PCP: Plotnikov, [name], MD  REFERRING PROVIDER: Leonce Katz, DO  REFERRING DIAG: Chronic left shoulder pain; Impingement syndrome of shoulder region, left  THERAPY DIAG:  Chronic left shoulder pain  Muscle weakness (generalized)  Rationale for Evaluation and Treatment: Rehabilitation  ONSET DATE: Chronic   SUBJECTIVE SUBJECTIVE STATEMENT: Patient reports she is  doing well and feels she is getting better. She does still have pain when reaching overhead or out to the side.   Eval: Patient reports left shoulder pain that has been there for months. She does remember how it happened. She did have an injection which has helped. She reports that moving the left shoulder or trying to reach behind her back causes pain, and when lying on the left shoulder it hurts. She denies any pain at rest.   Hand dominance: Right  PERTINENT HISTORY: See PMH above  PAIN:  Are you having pain? Yes:  NPRS scale: 0/10 at rest, 4/10 with shoulder movement Pain location: Left shoulder  Pain description: Dull achy Aggravating factors: Moving the left shoulder, reaching overhead or behind back Relieving factors: Rest  PRECAUTIONS: None  PATIENT GOALS: Pain relief   OBJECTIVE:  Note: Objective measures were completed at Evaluation unless otherwise noted. PATIENT SURVEYS:  QuickDASH: 27.3% disability  POSTURE: Rounded shoulder posture  UPPER EXTREMITY ROM:   Active ROM Right eval Left eval Left 07/02/2024 Left 07/23/2024  Shoulder flexion 145 125 140 145  Shoulder extension      Shoulder abduction 130 95    Shoulder adduction      Shoulder internal rotation T8 T12    Shoulder external rotation 65 / T2 50 / C7    Elbow flexion      Elbow extension      Wrist flexion      Wrist extension      Wrist ulnar deviation      Wrist radial deviation      Wrist pronation      Wrist supination      (Blank rows = not tested)  UPPER EXTREMITY MMT:  MMT Right eval Left eval  Shoulder flexion 5 4  Shoulder extension    Shoulder abduction 5 4  Shoulder adduction    Shoulder internal rotation 5 4+  Shoulder external rotation 5 4  Middle trapezius    Lower trapezius    Elbow flexion    Elbow extension    Wrist flexion    Wrist extension    Wrist ulnar deviation    Wrist radial deviation    Wrist pronation    Wrist supination    Grip strength (lbs)     (Blank rows = not tested)  JOINT MOBILITY TESTING:  Hypomobility noted left shoulder  PALPATION:  Tender to palpation left periacromial region  TREATMENT [facility] Adult PT Treatment:                                                DATE: 07/23/2024 UBE L2 x 4 min (fwd/bwd) to improve endurance and workload capacity Left GHJ mobs primarily inferior and posterior at various ranges of elevation Supine dowel shoulder flexion 2 x 10 Supine horizontal abduction with red 2 x 12 Supine serratus punch with 5# 2 x 10 left Row with black 3 x 10 Standing scaption palm down with 2# 3 x 10  PATIENT EDUCATION: Education details: HEP update Person educated: Patient Education method: Explanation, Demonstration, Tactile cues, Verbal cues, Handout Education comprehension: verbalized understanding, returned demonstration, verbal cues required, tactile cues required, and needs further education  HOME EXERCISE PROGRAM: Access Code: [email or web address]    ASSESSMENT: CLINICAL IMPRESSION: Patient tolerated therapy well with no adverse effects. Therapy focused on improving left shoulder mobility and progressing left rotator cuff strengthening and control with good tolerance. She was able to progress with her rotator cuff and periscapular strengthening. She exhibits improvement in her left shoulder active elevation but continues to have pain at end range flexion. She does report overall improvement in her symptoms. Updated her HEP to progress strengthening for home. Patient would benefit from continued skilled PT to progress mobility and strength in order to reduce pain and maximize functional ability.   Eval: Patient is a 74 y.o. female who was seen today for physical therapy evaluation and treatment for chronic left shoulder pain. Her left shoulder symptoms seem consistent with rotator cuff related  pain. She demonstrates limitations in her left shoulder active motion and strength, with pain while performing active movement or lifting type tasks with the left shoulder.  OBJECTIVE IMPAIRMENTS: decreased activity tolerance, decreased ROM, decreased strength, postural dysfunction, and pain.   ACTIVITY LIMITATIONS: carrying, lifting, bathing, dressing, reach over head, and hygiene/grooming  PARTICIPATION LIMITATIONS: meal prep, cleaning, laundry, driving, and shopping  PERSONAL FACTORS: Fitness and Time since onset of injury/illness/exacerbation are also affecting patient's functional outcome.    GOALS: Goals reviewed with patient? Yes  SHORT TERM GOALS: Target date: 07/18/2024  Patient will be I with initial HEP in order to progress with therapy. Baseline: HEP provided at eval 07/23/2024: independent Goal status: MET  2.  Patient will report left shoulder pain with movement and activity </= 2/10 in order to reduce functional limitations Baseline: 4/10 07/23/2024: 4/10 Goal status: ONGOING   LONG TERM GOALS: Target date: 08/15/2024  Patient will be I with final HEP to maintain progress from PT. Baseline: HEP provided at eval Goal status: INITIAL  2.  Patient will report QuickDASH </= 10% in order to indicate an improvement in their functional status Baseline: 27.3% Goal status: INITIAL  3.  Patient will demonstrate left shoulder strength 5/5 MMT in order to improve activity tolerance and reduce pain with shoulder movement Baseline: see limitations above Goal status: INITIAL  4.  Patient will demonstrate left shoulder flexion AROM >/= 140 deg and functional IR reach behind back >/= T10 in order to improve ability to perform self care tasks Baseline: see limitations above Goal status: INITIAL   PLAN: PT FREQUENCY: 1x/week  PT DURATION: 8 weeks  PLANNED INTERVENTIONS: [id number]- PT Re-evaluation, 97750- Physical Performance Testing, 97110-Therapeutic exercises, 97530-  Therapeutic activity, [id number]- Neuromuscular re-education, 97535- Self Care, [id number]- Manual therapy, Patient/Family education, Joint  mobilization, Joint manipulation, Spinal manipulation, Spinal mobilization, Cryotherapy, and Moist heat  PLAN FOR NEXT SESSION: Review HEP and progress PRN, manual for left shoulder mobility, progress left shoulder AAROM>AROM and rotator cuff strengthening, postural strengthening   Elaine Daring, PT, DPT, LAT, ATC 07/23/24  12:42 PM Phone: [phone] Fax: [phone]  "

## 2024-07-31 ENCOUNTER — Encounter: Admitting: Physical Therapy

## 2024-08-07 ENCOUNTER — Encounter: Admitting: Physical Therapy

## 2024-08-11 ENCOUNTER — Encounter: Admitting: Physical Therapy

## 2025-01-07 ENCOUNTER — Ambulatory Visit: Admitting: Internal Medicine
